# Patient Record
Sex: Male | Born: 1950 | Race: White | Hispanic: No | Marital: Single | Smoking: Former smoker
Health system: Southern US, Community
[De-identification: ages and names within clinical notes are randomized; demographics above are authoritative.]

## PROBLEM LIST (undated history)

## (undated) DIAGNOSIS — I639 Cerebral infarction, unspecified: Secondary | ICD-10-CM

## (undated) DIAGNOSIS — H269 Unspecified cataract: Secondary | ICD-10-CM

## (undated) DIAGNOSIS — I10 Essential (primary) hypertension: Secondary | ICD-10-CM

## (undated) DIAGNOSIS — D494 Neoplasm of unspecified behavior of bladder: Secondary | ICD-10-CM

## (undated) DIAGNOSIS — E785 Hyperlipidemia, unspecified: Secondary | ICD-10-CM

## (undated) HISTORY — DX: Neoplasm of unspecified behavior of bladder: D49.4

## (undated) HISTORY — DX: Essential (primary) hypertension: I10

## (undated) HISTORY — DX: Hyperlipidemia, unspecified: E78.5

## (undated) HISTORY — DX: Unspecified cataract: H26.9

## (undated) HISTORY — PX: MOUTH SURGERY: SHX715

## (undated) HISTORY — PX: CHOLECYSTECTOMY: SHX55

## (undated) HISTORY — DX: Cerebral infarction, unspecified: I63.9

## (undated) HISTORY — PX: EYE SURGERY: SHX253

---

## 2006-04-16 ENCOUNTER — Inpatient Hospital Stay (HOSPITAL_COMMUNITY): Admission: EM | Admit: 2006-04-16 | Discharge: 2006-04-28 | Payer: Self-pay | Admitting: Emergency Medicine

## 2006-04-20 ENCOUNTER — Ambulatory Visit: Payer: Self-pay | Admitting: Physical Medicine & Rehabilitation

## 2008-03-17 ENCOUNTER — Ambulatory Visit: Payer: Self-pay | Admitting: Gastroenterology

## 2008-03-20 ENCOUNTER — Telehealth: Payer: Self-pay | Admitting: Gastroenterology

## 2008-03-27 ENCOUNTER — Telehealth: Payer: Self-pay | Admitting: Gastroenterology

## 2008-03-28 ENCOUNTER — Encounter: Payer: Self-pay | Admitting: Gastroenterology

## 2008-03-28 ENCOUNTER — Ambulatory Visit: Payer: Self-pay | Admitting: Gastroenterology

## 2008-03-30 ENCOUNTER — Encounter: Payer: Self-pay | Admitting: Gastroenterology

## 2008-03-31 ENCOUNTER — Telehealth: Payer: Self-pay | Admitting: Gastroenterology

## 2008-04-07 DIAGNOSIS — I10 Essential (primary) hypertension: Secondary | ICD-10-CM

## 2008-04-07 DIAGNOSIS — E785 Hyperlipidemia, unspecified: Secondary | ICD-10-CM | POA: Insufficient documentation

## 2008-04-07 DIAGNOSIS — F70 Mild intellectual disabilities: Secondary | ICD-10-CM

## 2008-04-07 DIAGNOSIS — K5730 Diverticulosis of large intestine without perforation or abscess without bleeding: Secondary | ICD-10-CM | POA: Insufficient documentation

## 2008-05-17 ENCOUNTER — Ambulatory Visit: Payer: Self-pay | Admitting: Gastroenterology

## 2008-05-17 DIAGNOSIS — D126 Benign neoplasm of colon, unspecified: Secondary | ICD-10-CM | POA: Insufficient documentation

## 2008-05-17 DIAGNOSIS — K625 Hemorrhage of anus and rectum: Secondary | ICD-10-CM

## 2008-05-17 DIAGNOSIS — R1032 Left lower quadrant pain: Secondary | ICD-10-CM | POA: Insufficient documentation

## 2008-05-17 LAB — CONVERTED CEMR LAB: BUN: 13 mg/dL (ref 6–23)

## 2008-05-24 ENCOUNTER — Ambulatory Visit: Payer: Self-pay | Admitting: Cardiology

## 2008-05-25 ENCOUNTER — Encounter: Payer: Self-pay | Admitting: Gastroenterology

## 2008-05-29 ENCOUNTER — Telehealth: Payer: Self-pay | Admitting: Gastroenterology

## 2008-05-29 ENCOUNTER — Encounter: Payer: Self-pay | Admitting: Gastroenterology

## 2008-07-25 ENCOUNTER — Encounter (INDEPENDENT_AMBULATORY_CARE_PROVIDER_SITE_OTHER): Payer: Self-pay | Admitting: *Deleted

## 2008-07-25 ENCOUNTER — Ambulatory Visit (HOSPITAL_BASED_OUTPATIENT_CLINIC_OR_DEPARTMENT_OTHER): Admission: RE | Admit: 2008-07-25 | Discharge: 2008-07-25 | Payer: Self-pay | Admitting: Urology

## 2008-07-25 ENCOUNTER — Encounter (INDEPENDENT_AMBULATORY_CARE_PROVIDER_SITE_OTHER): Payer: Self-pay | Admitting: Urology

## 2009-06-04 ENCOUNTER — Ambulatory Visit (HOSPITAL_COMMUNITY): Admission: RE | Admit: 2009-06-04 | Discharge: 2009-06-04 | Payer: Self-pay | Admitting: Internal Medicine

## 2009-10-10 ENCOUNTER — Emergency Department (HOSPITAL_COMMUNITY): Admission: EM | Admit: 2009-10-10 | Discharge: 2009-10-10 | Payer: Self-pay | Admitting: Emergency Medicine

## 2010-04-11 NOTE — Op Note (Signed)
Summary: Bladder Tumor  NAME:  [name] [name], [name] [name]                 ACCOUNT NO.:  [id number]      MEDICAL RECORD NO.:  [id number]          PATIENT TYPE:  AMB      LOCATION:  [facility]                         FACILITY:  [facility]      PHYSICIAN:  Courtney Paris, M.D.DATE OF BIRTH:  1950/12/07      DATE OF PROCEDURE:  07/25/2008   DATE OF DISCHARGE:                                  OPERATIVE REPORT      PREOPERATIVE DIAGNOSIS:  Bladder tumor.      POSTOPERATIVE DIAGNOSIS:  Bladder tumor.      PROCEDURE PERFORMED:  Transurethral resection of bladder tumor(medium).      SURGEON:  Courtney Paris, M.D.      RESIDENTRonaldo Miyamoto A. Peggye Pitt, M.D.      ANESTHESIA:  General endotracheal anesthesia.      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      SPECIMENS:  Bladder tumor to Pathology.      ESTIMATED BLOOD LOSS:  Minimal.      COMPLICATIONS:  None.      DRAINS:  20 French Foley catheter to gravity.      INDICATIONS FOR PROCEDURE:  [name] [name] is a 60 year old Caucasian male who   developed gross hematuria.  He was evaluated with actual imaging and was   found to have a bladder tumor.  This was confirmed on office cystoscopy.   He was recommended to undergo transurethral resection.  He presents   today for the above-mentioned procedure.      DESCRIPTION OF PROCEDURE IN DETAIL:  [name] was brought back to the   operating room, quickly identified via his wrist band.  A preoperative   time out was called to confirm the correct patient, procedure and site.   After successful induction of general endotracheal anesthesia, he was   positioned in the dorsal lithotomy position, and great efforts were made   to reduce any pressure on bony prominences.  IV antibiotics were   administered.  His perineum was prepped and draped in the usual sterile   fashion.  Surgeons were sterilely gowned and gloved.      The urethra was calibrated using [employer] sounds.  A 22-French   cystoscopic sheath was used to insert a 12-degree lens  transurethrally   into the bladder.  Pan cystoscopy was performed.  The anterior and   posterior urethra were normal in appearance.  The bladder was normal in   shape, size and capacity.  There is no evidence of any diverticular   stone or foreign bodies, bilateral ureteral orifices were noted in their   usual anatomic position.  There seemed to be some clear yellow urine.   Cystoscopy with a 12 and 70-degree lens was performed and did reveal   evidence of a single papillary low-grade appearing bladder tumor   approximately 2 to 3 cm located in the left posterolateral wall.  The   cystoscope was then removed.  A 28-French resectoscope sheath was used to insert a resectoscope   transurethrally into the bladder.  The tumor was systematically   resected.  The wound edges were cauterized using electrocautery.   Excellent hemostasis was achieved.  The bladder chips were then   irrigated using a Toomey syringe and sent to permanent pathology.  The   patient's bladder was emptied and filled several times, and there was no   evidence of any occult bleeding.  The resectoscope was removed.  A 20-   French Foley catheter was then inserted transurethrally into the   patient's bladder, and the balloon was inflated with 10 cc of sterile   water.  It was connected to a drainage bag.  His urine was clear at the   end of the case.      Please note, Dr. Vic Blackbird was present and available and   participated in all aspects of this patient's care.      DISPOSITION:  The patient tolerated the procedure well, was extubated,   and transported to the PACU in stable condition.            ______________________________   [name], M.D.   Electronically Signed         KR/MEDQ  D:  07/25/2008  T:  07/25/2008  Job:  [id number]

## 2010-04-21 LAB — POCT I-STAT 4, (NA,K, GLUC, HGB,HCT)
HCT: 48 % (ref 39.0–52.0)
Hemoglobin: 16.3 g/dL (ref 13.0–17.0)
Sodium: 143 mEq/L (ref 135–145)

## 2010-05-28 NOTE — Op Note (Signed)
NAME:  [name] [name], [name] [name]                 ACCOUNT NO.:  [id number]   MEDICAL RECORD NO.:  [id number]          PATIENT TYPE:  AMB   LOCATION:  [facility]                         FACILITY:  [facility]   PHYSICIAN:  Courtney Paris, M.D.DATE OF BIRTH:  Jan 01, 1951   DATE OF PROCEDURE:  07/25/2008  DATE OF DISCHARGE:                               OPERATIVE REPORT   PREOPERATIVE DIAGNOSIS:  Bladder tumor.   POSTOPERATIVE DIAGNOSIS:  Bladder tumor.   PROCEDURE PERFORMED:  Transurethral resection of bladder tumor(medium).   SURGEON:  Courtney Paris, M.D.   RESIDENTRonaldo Miyamoto A. Peggye Pitt, M.D.   ANESTHESIA:  General endotracheal anesthesia.   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   SPECIMENS:  Bladder tumor to Pathology.   ESTIMATED BLOOD LOSS:  Minimal.   COMPLICATIONS:  None.   DRAINS:  20 French Foley catheter to gravity.   INDICATIONS FOR PROCEDURE:  [name] [name] is a 60 year old Caucasian male who  developed gross hematuria.  He was evaluated with actual imaging and was  found to have a bladder tumor.  This was confirmed on office cystoscopy.  He was recommended to undergo transurethral resection.  He presents  today for the above-mentioned procedure.   DESCRIPTION OF PROCEDURE IN DETAIL:  [name] [name] was brought back to the  operating room, quickly identified via his wrist band.  A preoperative  time out was called to confirm the correct patient, procedure and site.  After successful induction of general endotracheal anesthesia, he was  positioned in the dorsal lithotomy position, and great efforts were made  to reduce any pressure on bony prominences.  IV antibiotics were  administered.  His perineum was prepped and draped in the usual sterile  fashion.  Surgeons were sterilely gowned and gloved.   The urethra was calibrated using [employer] sounds.  A 22-French  cystoscopic sheath was used to insert a 12-degree lens   transurethrally  into the bladder.  Pan cystoscopy was performed.  The anterior and  posterior urethra were normal in appearance.  The bladder was normal in  shape, size and capacity.  There is no evidence of any diverticular  stone or foreign bodies, bilateral ureteral orifices were noted in their  usual anatomic position.  There seemed to be some clear yellow urine.  Cystoscopy with a 12 and 70-degree lens was performed and did reveal  evidence of a single papillary low-grade appearing bladder tumor  approximately 2 to 3 cm located in the left posterolateral wall.  The  cystoscope was then removed.   A 28-French resectoscope sheath was used to insert a resectoscope  transurethrally into the bladder.  The tumor was systematically  resected.  The wound edges were cauterized using electrocautery.  Excellent hemostasis was achieved.  The bladder chips were then  irrigated using a Toomey syringe and sent to permanent pathology.  The  patient's bladder was emptied and filled several times, and there was no  evidence of any occult bleeding.  The resectoscope was removed.  A 20-  [place] Foley  catheter was then inserted transurethrally into the  patient's bladder, and the balloon was inflated with 10 cc of sterile  water.  It was connected to a drainage bag.  His urine was clear at the  end of the case.   Please note, Dr. Vic Blackbird was present and available and  participated in all aspects of this patient's care.   DISPOSITION:  The patient tolerated the procedure well, was extubated,  and transported to the PACU in stable condition.     ______________________________  [name], M.D.  Electronically Signed    KR/MEDQ  D:  07/25/2008  T:  07/25/2008  Job:  [id number]

## 2010-05-31 NOTE — H&P (Signed)
NAME:  [name] [name], [name] [name]                 ACCOUNT NO.:  [id number]   MEDICAL RECORD NO.:  [id number]          PATIENT TYPE:  EMS   LOCATION:  [facility]                         FACILITY:  [facility]   PHYSICIAN:  Pramod P. Pearlean Brownie, MD    DATE OF BIRTH:  [birth year]   DATE OF ADMISSION:  04/16/2006  DATE OF DISCHARGE:                              HISTORY & PHYSICAL   REASON FOR REFERRAL:  Code stroke.   HISTORY OF PRESENT ILLNESS:  [name] [name] is a 60 year old Caucasian male  who was brought to the emergency room by EMS when they received a call  from the group home where he lives, with the patient having left-sided  weakness and numbness.  The patient states when he woke up in the  morning he was fine but at about 5 o'clock he tried to get out of the  bed.  He noticed that his left side was clumsy and weak.  He also  noticed that his speech was not right.  He also complained of her right  temporal headache.  His symptoms have persisted since admission.  EMS  called a code stroke en route and the patient code stroke was called and  the patient arrived at 7:49 a.m.  Admission CT scan was done and results  received at 8:37 which showed a large right frontoparietal cortical  intercerebral hemorrhage without intraventricular extension, mass  effect, or midline shift.  The patient had no documented seizure.  He  denies any previous history of stroke, TIA, seizures, or significant  neurological problems.   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  Denies history of  hypertension, family history of aneurysms or brain hemorrhage.   HOME MEDICATIONS:  None.   PAST SURGICAL HISTORY:  Eye surgery for a movement abnormality in  childhood.   MEDICATION ALLERGIES:  None known.   FAMILY HISTORY:  Not known at this time. The patient lives in assisted  living in a group home.  He does not smoke or drink.   REVIEW OF SYSTEMS:  Not significant  for any recent fever, cough, chest  pain, shortness of breath or diarrhea.   PHYSICAL EXAM:  GENERAL:  Reveals a middle-aged Caucasian male who is  not in distress.  He appears quite anxious.  VITAL SIGNS:  He is afebrile with temperature 97.1, pulse rate is 68,  regular, respiratory rate 18, distal pulses well. The head is  nontraumatic.  He has hypertelorism.  He has mild ptosis of the right  eye but according to the patient he had eye surgery on that side.  HEART:  Regular heart sounds.  LUNGS:  Clear to auscultation.  His blood pressure 168/95, pulse 68,  respiratory rate 18 per minute, saturations 97% on room air.  NEUROLOGY:  The patient is awake, alert.  He follows commands well.  He  has mild dysarthria.  There is no aphasia. Movements are full range  without nystagmus or diplopia.  He has mild ptosis of the right eye  which is  old.  There is mild left facial symmetry.  He has significant  left hemiparesis with grade 2 to 3/5 strength in the left upper  extremity and also 2/5 the left lower extremity.  A decreased sensation  on the left. Coordination is impaired on the left.  The NIH stroke scale  he scored 8.  Plantars downgoing.  Gait was not tested.   DATA REVIEWED:  Noncontrast CAT scan of the head done today reveals a  6.5 x 3.7 x 4.5 cm right posterior frontal cortical hemorrhage with  minimal mass effect, no intraventricular extension.  No midline shift is  seen.  There is no subarachnoid hemorrhage noted.  CT scan also shows a  large emesis of the corpus callosum with abnormal shaped scar.   LABS:  Pending at this time.   IMPRESSION:  56-hour gentleman with right frontal cortical intracerebral  hemorrhage, exact etiology is unclear at this time.  Possibilities  include an AV malformation given his congenital antibodies and mild  mental retardation.  There is no family history of aneurysms or  hypertension in this patient.   PLAN:  Patient is critically ill, will be  admitted to the neurological  intensive care unit for blood pressure control and monitoring.  We will  keep systolic blood pressure below 161, use IV labetalol p.r.n. dosages  and IV Cardene drip necessary.  Keep the patient n.p.o. until evaluated  for swallowing.  GI prophylaxis with Protonix and DVT prophylaxis with  TED hoses.  We will try to contact the patient's family and keep them  informed.  Neurosurgery has already been consulted, Dr. Donalee Citrin has  seen the patient and does not feel the patient needs surgical  intervention at the present time.  The patient is critically ill is  provided 1 hour of critical care time at the patient's bedside  directing, evaluating his care, and managing him.           ______________________________  Sunny Schlein. Pearlean Brownie, MD     PPS/MEDQ  D:  04/16/2006  T:  04/16/2006  Job:  [id number]

## 2010-05-31 NOTE — Consult Note (Signed)
[name] [name], [name] [name]                 ACCOUNT NO.:  [id number]   MEDICAL RECORD NO.:  [id number]          PATIENT TYPE:  INP   LOCATION:  1833                         FACILITY:  [facility]   PHYSICIAN:  Donalee Citrin, M.D.        DATE OF BIRTH:  Feb 12, 1950   DATE OF CONSULTATION:  04/16/2006  DATE OF DISCHARGE:                                 CONSULTATION   REASON FOR CONSULTATION:  Right parietal hemorrhage.   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  The patient apparently awoke this morning, felt fine,  dressed himself, got in his car, drove out to [place] for  breakfast, and noticed some clumsiness of his left hand.  He  subsequently called EMS and was brought here.  On evaluation, the  patient was emotional.  Vital signs were stable, and the patient was  noted to have some clumsiness and weakness on the left side and was sent  to CT scan, and then Neurosurgery was subsequently consulted.  The  patient currently on evaluation is awake and alert.  He is oriented x2.  Pupils are slight anasicoric with the left side being approximately 1 mm  larger than the right but both reactive.  Extraocular movements are  noted to be intact.  Cranial nerves appear to be otherwise intact except  for a slight left central seventh.  Extremity strength has about 4 out  of 5 weakness on the left side both in the upper and lower extremity;  however, lower extremity is probably more like 4 plus out of 5.  Reflexes appear to be 2+ on the right side.  However, on the left side  the patient was noncompliant and would not relax.  Had some increased  tone.  This was felt to be the patient's inability to comply with the  examination and not necessarily representing overt spasticity.  CT scan  showed a large right posterior  frontoparietal fusiform hemorrhage that  could represent hemorrhage CVA versus low bar/amyloid hemorrhage.  The  patient currently does not appear to be on any medications or blood  thinners, and it is unknown whether the patient is taking any  recreational medications and would recommend a toxicology screen,  recommend stroke workup.  Neurology is already on board and consulting.  This is not a nonsurgical hemorrhage at this time.  Would follow with  serial CTs in the morning, check his coags and toxicology screen and  follow with every one hour serial neurological checks.           ______________________________  Donalee Citrin, M.D.     GC/MEDQ  D:  04/16/2006  T:  04/16/2006  Job:  [id number]

## 2010-05-31 NOTE — Discharge Summary (Signed)
NAME:  [name] [name], [name]                   ACCOUNT NO.:  and no   MEDICAL RECORD NO.:  [id number]          PATIENT TYPE:  INP   LOCATION:  3009                         FACILITY:  [facility]   PHYSICIAN:  [name] P. Pearlean Brownie, MD    DATE OF BIRTH:  [birth year]   DATE OF ADMISSION:  04/16/2006  DATE OF DISCHARGE:                               DISCHARGE SUMMARY   DIAGNOSES AT THE TIME OF DISCHARGE:  1. 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  2. Hypertension.  3. Dyslipidemia.  4. Mild mental retardation.  5. Living in a boarding house prior to admission.   MEDICINES AT TIME OF DISCHARGE:  1. Hydrochlorothiazide 25 mg a day.  2. Niacin 500 mg a day.  3. Zocor 40 mg a day.  4. Colace 100 mg b.i.d.   STUDIES PERFORMED:  1. CT of the brain on admission showed acute right hemispheric intra-      axial hemorrhage involving the high right frontal parietal region.      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  2. MRI of the brain showed a 2.1 x 6.9 intraparenchymal hematoma and a      high right parietal lobe with surrounding edema, underlying pattern      of agenesis of corpus callosum, mild small-vessel changes in the      deep white matter, inflammatory disease of the paranasal sinuses on      the right.  3. MRA of the head shows normal endocranial MRA.  4. 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  Non-visualization of the inferior sagittal      sinus with diminutive internal cerebral veins.  Straight sinus      appears to be patent.  Venous drainage pattern is otherwise, as      expected.  5. Carotid Doppler not performed.  CT echocardiogram not performed.  6. EKG shows normal sinus rhythm.   LABORATORY  STUDIES:  Shows CBC normal, differential normal, coagulation  studies normal, chemistries with glucose 102, otherwise normal.  Glomerular filtration rate greater than 60.  Chemistry normal.  Cholesterol 168, triglycerides 95, HDL 25, LDL 124, urine drug screen is  negative.  Alcohol level is less than 5.  Urinalysis shows 15 ketones,  otherwise normal.   HISTORY OF PRESENT ILLNESS:  Mr. Shank is a 60 year-old Caucasian male  who was brought emergency room by EMS when they received a call from the  boarding house where he lives.  The patient was having left-sided  weakness and numbness.  The patient states that when he woke up in the  morning he was fine.  But at about five o'clock in the morning when he  tried to get out of bed, he noticed that his left side was clumsy and  weak.  He also noticed that his speech was not right.  He complained of  right temporal headache.  Symptoms have persisted since admission.  EMS  called a code stroke en route.  The patient arrived at 7:49 a.m.  A CT  of the head was done with results received a 8:37 a.m., which showed a  large right, frontoparietal, cortical, cerebral hemorrhage without  intraventricular extension, mass effect or midline shift.  The patient  had no documented seizure.  He denies any previous stroke, TIAs,  seizures, or significant neurological problems.  He was admitted to the  ICU for further evaluation.   HOSPITAL COURSE:  Dr. Donalee Citrin, your surgeon, was consulted.  He was  not a neurosurgical candidate on admission, so they did follow him for  the first few days; until he became stable.  His blood pressure was  controlled in the emergency room.  A cerebral angiogram was performed to  rule out cortical venous thrombosis, versus subdural AVM and both were  negative.  PT and OT then began working with him after his transfer to  the floor.  They felt he would need some rehab; as he is from a boarding  house, and a sister who is also  likely slightly mentally retarded was  unable to care for him at the time of discharge.  He cannot go to  inpatient rehab as he had no discharge plan. Plans are to send him to  skilled nursing facility with hopeful eventual return to the boarding  house.  Of note the patient was improving significantly during his  hospital course.  The patient was identified with vascular risk factors  of hypertension and dyslipidemia for which he was started on blood  pressure medicine, statin and niacin.  The patient has maintained  stability the last week of hospitalization, awaiting a nursing home bed.  Anticipated date of discharge is April 28, 2006, at the time of this  dictation.   CONDITION AT DISCHARGE:  Patient alert and oriented x3.  No dysarthria,  extraocular movements intact.  Muscle strength is 5/5 on the right and  4/5 on the left.  He has mild left upper extremity drift.  His heart  rate is regular his lungs are clear. His abdomen is soft and it is  nondistended.   DISCHARGE PLAN:  1. Discharged to skilled nursing facility for rehab.  Anticipate      eventual return to boarding house.  2. New statin and niacin for elevated cholesterol levels; will recheck      in four to six weeks.  3. Followup blood pressure. Primary care physician to followup other      vascular risk factors.  4. Follow up with Dr. Pearlean [name] in his office in 2 to 3 months.      [name] [name] [name], N.P.    ______________________________  Sunny Schlein. Pearlean Brownie, MD    SB/MEDQ  D:  04/27/2006  T:  04/27/2006  Job:  [id number]   cc:   [name] P. Pearlean Brownie, MD

## 2012-04-12 DIAGNOSIS — I6990 Unspecified sequelae of unspecified cerebrovascular disease: Secondary | ICD-10-CM

## 2012-04-12 DIAGNOSIS — I119 Hypertensive heart disease without heart failure: Secondary | ICD-10-CM

## 2012-04-12 DIAGNOSIS — E785 Hyperlipidemia, unspecified: Secondary | ICD-10-CM

## 2012-04-12 DIAGNOSIS — Q619 Cystic kidney disease, unspecified: Secondary | ICD-10-CM

## 2012-04-12 DIAGNOSIS — K5900 Constipation, unspecified: Secondary | ICD-10-CM

## 2012-04-12 DIAGNOSIS — F70 Mild intellectual disabilities: Secondary | ICD-10-CM

## 2012-05-10 DIAGNOSIS — I6990 Unspecified sequelae of unspecified cerebrovascular disease: Secondary | ICD-10-CM

## 2012-05-10 DIAGNOSIS — F70 Mild intellectual disabilities: Secondary | ICD-10-CM

## 2012-05-10 DIAGNOSIS — I119 Hypertensive heart disease without heart failure: Secondary | ICD-10-CM

## 2012-05-10 DIAGNOSIS — K5900 Constipation, unspecified: Secondary | ICD-10-CM

## 2012-05-10 DIAGNOSIS — Q619 Cystic kidney disease, unspecified: Secondary | ICD-10-CM

## 2012-05-10 DIAGNOSIS — E785 Hyperlipidemia, unspecified: Secondary | ICD-10-CM

## 2012-05-26 DIAGNOSIS — K5900 Constipation, unspecified: Secondary | ICD-10-CM

## 2012-05-26 DIAGNOSIS — I119 Hypertensive heart disease without heart failure: Secondary | ICD-10-CM

## 2012-05-26 DIAGNOSIS — Q619 Cystic kidney disease, unspecified: Secondary | ICD-10-CM

## 2012-05-26 DIAGNOSIS — E785 Hyperlipidemia, unspecified: Secondary | ICD-10-CM

## 2012-05-26 DIAGNOSIS — F70 Mild intellectual disabilities: Secondary | ICD-10-CM

## 2012-05-26 DIAGNOSIS — I6990 Unspecified sequelae of unspecified cerebrovascular disease: Secondary | ICD-10-CM

## 2012-06-30 DIAGNOSIS — K5900 Constipation, unspecified: Secondary | ICD-10-CM

## 2012-06-30 DIAGNOSIS — E785 Hyperlipidemia, unspecified: Secondary | ICD-10-CM

## 2012-06-30 DIAGNOSIS — I119 Hypertensive heart disease without heart failure: Secondary | ICD-10-CM

## 2012-06-30 DIAGNOSIS — Q619 Cystic kidney disease, unspecified: Secondary | ICD-10-CM

## 2012-06-30 DIAGNOSIS — I6990 Unspecified sequelae of unspecified cerebrovascular disease: Secondary | ICD-10-CM

## 2012-06-30 DIAGNOSIS — T148XXA Other injury of unspecified body region, initial encounter: Secondary | ICD-10-CM

## 2012-06-30 DIAGNOSIS — Z79899 Other long term (current) drug therapy: Secondary | ICD-10-CM

## 2012-08-05 ENCOUNTER — Non-Acute Institutional Stay: Payer: PRIVATE HEALTH INSURANCE | Admitting: Internal Medicine

## 2012-08-05 DIAGNOSIS — R7309 Other abnormal glucose: Secondary | ICD-10-CM

## 2012-08-05 DIAGNOSIS — K5900 Constipation, unspecified: Secondary | ICD-10-CM | POA: Insufficient documentation

## 2012-08-05 DIAGNOSIS — I10 Essential (primary) hypertension: Secondary | ICD-10-CM

## 2012-08-05 DIAGNOSIS — R739 Hyperglycemia, unspecified: Secondary | ICD-10-CM | POA: Insufficient documentation

## 2012-08-05 DIAGNOSIS — E785 Hyperlipidemia, unspecified: Secondary | ICD-10-CM

## 2012-08-05 NOTE — Progress Notes (Signed)
PROGRESS NOTE  DATE: 08/05/2012  FACILITY: Nursing Home Location: [facility]  LEVEL OF CARE: ALF (13)  Routine Visit  CHIEF COMPLAINT:  Manage hypertension, hyperlipidemia and constipation  HISTORY OF PRESENT ILLNESS:  REASSESSMENT OF ONGOING PROBLEM(S):  HTN: Pt 's HTN remains stable.  Denies CP, sob, DOE, pedal edema, headaches, dizziness or visual disturbances.  No complications from the medications currently being used.  Last BP : 122/72  HYPERLIPIDEMIA: No complications from the medications presently being used. Last fasting lipid panel showed : HDL 33 otherwise fostering lipid panel normal in 7/14  CONSTIPATION: The constipation remains stable. No complications from the medications presently being used. Patient denies ongoing constipation, abdominal pain, nausea or vomiting.  PAST MEDICAL HISTORY : Reviewed.  No changes.  CURRENT MEDICATIONS: Reviewed per [facility]  REVIEW OF SYSTEMS:  GENERAL: no change in appetite, no fatigue, no weight changes, no fever, chills or weakness RESPIRATORY: no cough, SOB, DOE, wheezing, hemoptysis CARDIAC: no chest pain, edema or palpitations GI: no abdominal pain, diarrhea, constipation, heart burn, nausea or vomiting  PHYSICAL EXAMINATION  VS:  T 98.6       P 64     RR 20      BP     POX %     WT (Lb) 209  GENERAL: no acute distress, moderately obese body habitus EYES: conjunctivae normal, sclerae normal, normal eye lids NECK: supple, trachea midline, no neck masses, no thyroid tenderness, no thyromegaly LYMPHATICS: no LAN in the neck, no supraclavicular LAN RESPIRATORY: breathing is even & unlabored, BS CTAB CARDIAC: RRR, no murmur,no extra heart sounds, no edema GI: abdomen soft, normal BS, no masses, no tenderness, no hepatomegaly, no splenomegaly PSYCHIATRIC: the patient is alert & oriented to person, affect & behavior appropriate  LABS/RADIOLOGY:  7/14 glucose 140 otherwise CMP normal 6/14 CBC  normal  ASSESSMENT/PLAN:  Hypertension-well-controlled. Lisinopril was started. Hyperlipidemia on-well-controlled. Constipation-well-controlled. Hyperglycemia-new problem. Check fasting glucose level and hemoglobin A1c. 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  CPT CODE: 84696

## 2012-09-06 ENCOUNTER — Non-Acute Institutional Stay: Payer: PRIVATE HEALTH INSURANCE | Admitting: Internal Medicine

## 2012-09-06 DIAGNOSIS — E785 Hyperlipidemia, unspecified: Secondary | ICD-10-CM

## 2012-09-06 DIAGNOSIS — R7309 Other abnormal glucose: Secondary | ICD-10-CM

## 2012-09-06 DIAGNOSIS — K5900 Constipation, unspecified: Secondary | ICD-10-CM

## 2012-09-06 DIAGNOSIS — R739 Hyperglycemia, unspecified: Secondary | ICD-10-CM

## 2012-09-06 DIAGNOSIS — I10 Essential (primary) hypertension: Secondary | ICD-10-CM

## 2012-09-08 NOTE — Progress Notes (Signed)
PROGRESS NOTE  DATE: 09-06-12  FACILITY: Nursing Home Location: [facility]  LEVEL OF CARE: ALF (13)  Routine Visit  CHIEF COMPLAINT:  Manage hypertension, hyperlipidemia and constipation  HISTORY OF PRESENT ILLNESS:  REASSESSMENT OF ONGOING PROBLEM(S):  HTN: Pt 's HTN remains stable.  Denies CP, sob, DOE, pedal edema, headaches, dizziness or visual disturbances.  No complications from the medications currently being used.  Last BP : 122/72, 128/64  HYPERLIPIDEMIA: No complications from the medications presently being used. Last fasting lipid panel showed : HDL 33 otherwise fostering lipid panel normal in 7/14  CONSTIPATION: The constipation remains stable. No complications from the medications presently being used. Patient denies ongoing constipation, abdominal pain, nausea or vomiting.  PAST MEDICAL HISTORY : Reviewed.  No changes.  CURRENT MEDICATIONS: Reviewed per [facility]  REVIEW OF SYSTEMS:  GENERAL: no change in appetite, no fatigue, no weight changes, no fever, chills or weakness RESPIRATORY: no cough, SOB, DOE, wheezing, hemoptysis CARDIAC: no chest pain, edema or palpitations GI: no abdominal pain, diarrhea, constipation, heart burn, nausea or vomiting  PHYSICAL EXAMINATION  VS:  T 98.1       P 60     RR 18      BP 128/64     POX %     WT (Lb) 209  GENERAL: no acute distress, moderately obese body habitus EYES: conjunctivae normal, sclerae normal, normal eye lids NECK: supple, trachea midline, no neck masses, no thyroid tenderness, no thyromegaly LYMPHATICS: no LAN in the neck, no supraclavicular LAN RESPIRATORY: breathing is even & unlabored, BS CTAB CARDIAC: RRR, no murmur,no extra heart sounds, no edema GI: abdomen soft, normal BS, no masses, no tenderness, no hepatomegaly, no splenomegaly PSYCHIATRIC: the patient is alert & oriented to person, affect & behavior appropriate  LABS/RADIOLOGY:  7/14 glucose 140 otherwise CMP normal 6/14 CBC  normal  ASSESSMENT/PLAN:  Hypertension-well-controlled.  Hyperlipidemia on-well-controlled. Constipation-well-controlled. Hyperglycemia- fasting glucose level and hemoglobin A1c level pending. 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  CPT CODE: 11914

## 2012-09-30 ENCOUNTER — Non-Acute Institutional Stay: Payer: PRIVATE HEALTH INSURANCE | Admitting: Internal Medicine

## 2012-09-30 DIAGNOSIS — E785 Hyperlipidemia, unspecified: Secondary | ICD-10-CM

## 2012-09-30 DIAGNOSIS — I10 Essential (primary) hypertension: Secondary | ICD-10-CM

## 2012-09-30 DIAGNOSIS — R7309 Other abnormal glucose: Secondary | ICD-10-CM

## 2012-09-30 DIAGNOSIS — K5900 Constipation, unspecified: Secondary | ICD-10-CM

## 2012-09-30 DIAGNOSIS — R739 Hyperglycemia, unspecified: Secondary | ICD-10-CM

## 2012-10-01 NOTE — Progress Notes (Signed)
PROGRESS NOTE  DATE: 09-30-12  FACILITY: Nursing Home Location: [facility]  LEVEL OF CARE: ALF (13)  Routine Visit  CHIEF COMPLAINT:  Manage hypertension, hyperlipidemia and constipation  HISTORY OF PRESENT ILLNESS:  REASSESSMENT OF ONGOING PROBLEM(S):  HTN: Pt 's HTN remains stable.  Denies CP, sob, DOE, pedal edema, headaches, dizziness or visual disturbances.  No complications from the medications currently being used.  Last BP : 122/72, 128/64, 152/72  HYPERLIPIDEMIA: No complications from the medications presently being used. Last fasting lipid panel showed : HDL 33 otherwise fostering lipid panel normal in 7/14  CONSTIPATION: The constipation remains stable. No complications from the medications presently being used. Patient denies ongoing constipation, abdominal pain, nausea or vomiting.  PAST MEDICAL HISTORY : Reviewed.  No changes.  CURRENT MEDICATIONS: Reviewed per [facility]  REVIEW OF SYSTEMS:  GENERAL: no change in appetite, no fatigue, no weight changes, no fever, chills or weakness RESPIRATORY: no cough, SOB, DOE, wheezing, hemoptysis CARDIAC: no chest pain, edema or palpitations GI: no abdominal pain, diarrhea, constipation, heart burn, nausea or vomiting  PHYSICAL EXAMINATION  VS:  T 96       P 66     RR 18      BP 152/72    POX %     WT (Lb) 212  GENERAL: no acute distress, moderately obese body habitus EYES: conjunctivae normal, sclerae normal, normal eye lids NECK: supple, trachea midline, no neck masses, no thyroid tenderness, no thyromegaly LYMPHATICS: no LAN in the neck, no supraclavicular LAN RESPIRATORY: breathing is even & unlabored, BS CTAB CARDIAC: RRR, no murmur,no extra heart sounds, no edema GI: abdomen soft, normal BS, no masses, no tenderness, no hepatomegaly, no splenomegaly PSYCHIATRIC: the patient is alert & oriented to person, affect & behavior appropriate  LABS/RADIOLOGY:  7/14 glucose 140 otherwise CMP normal 6/14  CBC normal  ASSESSMENT/PLAN:  Hypertension-blood pressure elevated. Will review a log. Hyperlipidemia on-well-controlled. Constipation-well-controlled. Hyperglycemia- fasting glucose level and hemoglobin A1c level pending. 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  CPT CODE: 16109

## 2012-11-09 ENCOUNTER — Non-Acute Institutional Stay: Payer: PRIVATE HEALTH INSURANCE | Admitting: Internal Medicine

## 2012-11-09 DIAGNOSIS — K5900 Constipation, unspecified: Secondary | ICD-10-CM

## 2012-11-09 DIAGNOSIS — E785 Hyperlipidemia, unspecified: Secondary | ICD-10-CM

## 2012-11-09 DIAGNOSIS — D494 Neoplasm of unspecified behavior of bladder: Secondary | ICD-10-CM

## 2012-11-09 DIAGNOSIS — I10 Essential (primary) hypertension: Secondary | ICD-10-CM

## 2012-11-12 DIAGNOSIS — D494 Neoplasm of unspecified behavior of bladder: Secondary | ICD-10-CM | POA: Insufficient documentation

## 2012-11-12 NOTE — Progress Notes (Signed)
PROGRESS NOTE  DATE: 11-09-12  FACILITY:    LEVEL OF CARE: ALF (13)  Routine Visit  CHIEF COMPLAINT:  Manage hypertension, hyperlipidemia and constipation  HISTORY OF PRESENT ILLNESS:  REASSESSMENT OF ONGOING PROBLEM(S):  HTN: Pt 's HTN remains stable.  Denies CP, sob, DOE, pedal edema, headaches, dizziness or visual disturbances.  No complications from the medications currently being used.  Last BP : 122/72, 128/64, 152/72, 118/66  HYPERLIPIDEMIA: No complications from the medications presently being used. Last fasting lipid panel showed : HDL 33 otherwise fostering lipid panel normal in 7/14  CONSTIPATION: The constipation remains stable. No complications from the medications presently being used. Patient denies ongoing constipation, abdominal pain, nausea or vomiting.  PAST MEDICAL HISTORY : Reviewed.  No changes.  CURRENT MEDICATIONS: Reviewed per [facility]  REVIEW OF SYSTEMS:  GENERAL: no change in appetite, no fatigue, no weight changes, no fever, chills or weakness RESPIRATORY: no cough, SOB, DOE, wheezing, hemoptysis CARDIAC: no chest pain, edema or palpitations GI: no abdominal pain, diarrhea, constipation, heart burn, nausea or vomiting  PHYSICAL EXAMINATION  VS:  T 98.1       P 68     RR 20      BP 118/66    POX %     WT (Lb) 210  GENERAL: no acute distress, moderately obese body habitus EYES: conjunctivae normal, sclerae normal, normal eye lids NECK: supple, trachea midline, no neck masses, no thyroid tenderness, no thyromegaly LYMPHATICS: no LAN in the neck, no supraclavicular LAN RESPIRATORY: breathing is even & unlabored, BS CTAB CARDIAC: RRR, no murmur,no extra heart sounds, no edema GI: abdomen soft, normal BS, no masses, no tenderness, no hepatomegaly, no splenomegaly PSYCHIATRIC: the patient is alert & oriented to person, affect & behavior appropriate  LABS/RADIOLOGY:  9-14 hemoglobin A1c 5.7 8-14 CBC normal, TSH 3.63  7/14 glucose 140 otherwise  CMP normal 6/14 CBC normal  ASSESSMENT/PLAN:  Hypertension-well controlled Hyperlipidemia on-well-controlled. Constipation-well-controlled. 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  CPT CODE: 16109

## 2012-12-02 ENCOUNTER — Encounter: Payer: Self-pay | Admitting: Internal Medicine

## 2012-12-02 ENCOUNTER — Non-Acute Institutional Stay: Payer: PRIVATE HEALTH INSURANCE | Admitting: Internal Medicine

## 2012-12-02 DIAGNOSIS — E785 Hyperlipidemia, unspecified: Secondary | ICD-10-CM

## 2012-12-02 DIAGNOSIS — I10 Essential (primary) hypertension: Secondary | ICD-10-CM

## 2012-12-02 DIAGNOSIS — D494 Neoplasm of unspecified behavior of bladder: Secondary | ICD-10-CM

## 2012-12-02 DIAGNOSIS — K5900 Constipation, unspecified: Secondary | ICD-10-CM

## 2012-12-02 NOTE — Progress Notes (Signed)
PROGRESS NOTE  DATE: 12-02-12  FACILITY: Nursing Home Location: [facility]  LEVEL OF CARE: ALF (13)  Routine Visit  CHIEF COMPLAINT:  Manage hypertension, hyperlipidemia and constipation  HISTORY OF PRESENT ILLNESS:  REASSESSMENT OF ONGOING PROBLEM(S):  HTN: Pt 's HTN remains stable.  Denies CP, sob, DOE, pedal edema, headaches, dizziness or visual disturbances.  No complications from the medications currently being used.  Last BP : 122/72, 128/64, 152/72, 118/66, 126/78  HYPERLIPIDEMIA: No complications from the medications presently being used. Last fasting lipid panel showed : HDL 33 otherwise fostering lipid panel normal in 7/14  CONSTIPATION: The constipation remains stable. No complications from the medications presently being used. Patient denies ongoing constipation, abdominal pain, nausea or vomiting.  PAST MEDICAL HISTORY : Reviewed.  No changes.  CURRENT MEDICATIONS: Reviewed per [facility]  REVIEW OF SYSTEMS:  GENERAL: no change in appetite, no fatigue, no weight changes, no fever, chills or weakness RESPIRATORY: no cough, SOB, DOE, wheezing, hemoptysis CARDIAC: no chest pain, edema or palpitations GI: no abdominal pain, diarrhea, constipation, heart burn, nausea or vomiting  PHYSICAL EXAMINATION  VS:  T 97.5       P 68     RR 20      BP 126/78    POX %     WT (Lb) 210  GENERAL: no acute distress, moderately obese body habitus EYES: conjunctivae normal, sclerae normal, normal eye lids NECK: supple, trachea midline, no neck masses, no thyroid tenderness, no thyromegaly LYMPHATICS: no LAN in the neck, no supraclavicular LAN RESPIRATORY: breathing is even & unlabored, BS CTAB CARDIAC: RRR, no murmur,no extra heart sounds, no edema GI: abdomen soft, normal BS, no masses, no tenderness, no hepatomegaly, no splenomegaly PSYCHIATRIC: the patient is alert & oriented to person, affect & behavior appropriate  LABS/RADIOLOGY:  9-14 hemoglobin A1c  5.7 8-14 CBC normal, TSH 3.63  7/14 glucose 140 otherwise CMP normal 6/14 CBC normal  ASSESSMENT/PLAN:  Hypertension-well controlled Hyperlipidemia on-well-controlled. Constipation-well-controlled. 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  CPT CODE: 45409

## 2012-12-23 ENCOUNTER — Non-Acute Institutional Stay: Payer: PRIVATE HEALTH INSURANCE | Admitting: Internal Medicine

## 2012-12-23 DIAGNOSIS — E785 Hyperlipidemia, unspecified: Secondary | ICD-10-CM

## 2012-12-23 DIAGNOSIS — I10 Essential (primary) hypertension: Secondary | ICD-10-CM

## 2012-12-23 DIAGNOSIS — D494 Neoplasm of unspecified behavior of bladder: Secondary | ICD-10-CM

## 2012-12-23 DIAGNOSIS — K5900 Constipation, unspecified: Secondary | ICD-10-CM

## 2012-12-25 ENCOUNTER — Encounter: Payer: Self-pay | Admitting: Internal Medicine

## 2012-12-25 NOTE — Progress Notes (Signed)
PROGRESS NOTE  DATE: 12-23-12  FACILITY: Nursing Home [facility]: [facility]  LEVEL OF CARE: ALF (13)  Routine Visit  CHIEF COMPLAINT:  Manage hypertension, hyperlipidemia and constipation  HISTORY OF PRESENT ILLNESS:  REASSESSMENT OF ONGOING PROBLEM(S):  HTN: Pt 's HTN remains stable.  Denies CP, sob, DOE, pedal edema, headaches, dizziness or visual disturbances.  No complications from the medications currently being used.  Last BP : 122/72, 128/64, 152/72, 118/66, 126/78, 122/64  HYPERLIPIDEMIA: No complications from the medications presently being used. Last fasting lipid panel showed : HDL 33 otherwise fostering lipid panel normal in 7/14  CONSTIPATION: The constipation remains stable. No complications from the medications presently being used. Patient denies ongoing constipation, abdominal pain, nausea or vomiting.  PAST MEDICAL HISTORY : Reviewed.  No changes.  CURRENT MEDICATIONS: Reviewed per [facility]  REVIEW OF SYSTEMS:  GENERAL: no change in appetite, no fatigue, no weight changes, no fever, chills or weakness RESPIRATORY: no cough, SOB, DOE, wheezing, hemoptysis CARDIAC: no chest pain, edema or palpitations GI: no abdominal pain, diarrhea, constipation, heart burn, nausea or vomiting  PHYSICAL EXAMINATION  VS:  T 97.6       P 72     RR 18      BP 122/64    POX %     WT (Lb) 209  GENERAL: no acute distress, moderately obese body habitus EYES: conjunctivae normal, sclerae normal, normal eye lids NECK: supple, trachea midline, no neck masses, no thyroid tenderness, no thyromegaly LYMPHATICS: no LAN in the neck, no supraclavicular LAN RESPIRATORY: breathing is even & unlabored, BS CTAB CARDIAC: RRR, no murmur,no extra heart sounds, no edema GI: abdomen soft, normal BS, no masses, no tenderness, no hepatomegaly, no splenomegaly PSYCHIATRIC: the patient is alert & oriented to person, affect & behavior appropriate  LABS/RADIOLOGY:  9-14 hemoglobin  A1c 5.7 8-14 CBC normal, TSH 3.63  7/14 glucose 140 otherwise CMP normal 6/14 CBC normal  ASSESSMENT/PLAN:  Hypertension-well controlled Hyperlipidemia on-well-controlled. Constipation-well-controlled. 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  CPT CODE: 11914

## 2013-01-27 ENCOUNTER — Non-Acute Institutional Stay: Payer: PRIVATE HEALTH INSURANCE | Admitting: Internal Medicine

## 2013-01-27 DIAGNOSIS — I10 Essential (primary) hypertension: Secondary | ICD-10-CM

## 2013-01-27 DIAGNOSIS — E785 Hyperlipidemia, unspecified: Secondary | ICD-10-CM

## 2013-01-27 DIAGNOSIS — D494 Neoplasm of unspecified behavior of bladder: Secondary | ICD-10-CM

## 2013-01-27 DIAGNOSIS — K5900 Constipation, unspecified: Secondary | ICD-10-CM

## 2013-01-29 NOTE — Progress Notes (Signed)
         PROGRESS NOTE  DATE: 01-27-13  FACILITY: Nursing Home Location: [facility]  LEVEL OF CARE: ALF (13)  Routine Visit  CHIEF COMPLAINT:  Manage hypertension, hyperlipidemia and constipation  HISTORY OF PRESENT ILLNESS:  REASSESSMENT OF ONGOING PROBLEM(S):  HTN: Pt 's HTN remains stable.  Denies CP, sob, DOE, pedal edema, headaches, dizziness or visual disturbances.  No complications from the medications currently being used.  Last BP : 122/72, 128/64, 152/72, 118/66, 126/78, 122/64, 130/64  HYPERLIPIDEMIA: No complications from the medications presently being used. Last fasting lipid panel showed : HDL 33 otherwise fostering lipid panel normal in 7/14  CONSTIPATION: The constipation remains stable. No complications from the medications presently being used. Patient denies ongoing constipation, abdominal pain, nausea or vomiting.  PAST MEDICAL HISTORY : Reviewed.  No changes.  CURRENT MEDICATIONS: Reviewed per [facility]  REVIEW OF SYSTEMS:  GENERAL: no change in appetite, no fatigue, no weight changes, no fever, chills or weakness RESPIRATORY: no cough, SOB, DOE, wheezing, hemoptysis CARDIAC: no chest pain, edema or palpitations GI: no abdominal pain, diarrhea, constipation, heart burn, nausea or vomiting  PHYSICAL EXAMINATION  VS:  T 96.5       P 72     RR 18      BP 130/64    POX %     WT (Lb) 211  GENERAL: no acute distress, moderately obese body habitus EYES: conjunctivae normal, sclerae normal, normal eye lids NECK: supple, trachea midline, no neck masses, no thyroid tenderness, no thyromegaly LYMPHATICS: no LAN in the neck, no supraclavicular LAN RESPIRATORY: breathing is even & unlabored, BS CTAB CARDIAC: RRR, no murmur,no extra heart sounds, no edema GI: abdomen soft, normal BS, no masses, no tenderness, no hepatomegaly, no splenomegaly PSYCHIATRIC: the patient is alert & oriented to person, affect & behavior appropriate  LABS/RADIOLOGY:  12-14  scrotal ultrasound-bilateral hydroceles  9-14 hemoglobin A1c 5.7 8-14 CBC normal, TSH 3.63  7/14 glucose 140 otherwise CMP normal 6/14 CBC normal  ASSESSMENT/PLAN:  Hypertension-well controlled Hyperlipidemia on-well-controlled. Check fasting lipid panel Constipation-well-controlled. 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 Check CMP  CPT CODE: 10932

## 2013-02-11 ENCOUNTER — Encounter: Payer: Self-pay | Admitting: Gastroenterology

## 2013-03-04 ENCOUNTER — Encounter: Payer: Self-pay | Admitting: *Deleted

## 2013-05-11 ENCOUNTER — Non-Acute Institutional Stay: Payer: PRIVATE HEALTH INSURANCE | Admitting: Internal Medicine

## 2013-05-11 DIAGNOSIS — I10 Essential (primary) hypertension: Secondary | ICD-10-CM

## 2013-05-11 DIAGNOSIS — K5900 Constipation, unspecified: Secondary | ICD-10-CM

## 2013-05-11 DIAGNOSIS — D494 Neoplasm of unspecified behavior of bladder: Secondary | ICD-10-CM

## 2013-05-11 DIAGNOSIS — E785 Hyperlipidemia, unspecified: Secondary | ICD-10-CM

## 2013-05-13 NOTE — Progress Notes (Signed)
          PROGRESS NOTE  DATE: 05-11-13  FACILITY: Nursing Home Location: [facility]  LEVEL OF CARE: ALF (13)  Routine Visit  CHIEF COMPLAINT:  Manage hypertension, hyperlipidemia and constipation  HISTORY OF PRESENT ILLNESS:  REASSESSMENT OF ONGOING PROBLEM(S):  HTN: Pt 's HTN remains stable.  Denies CP, sob, DOE, pedal edema, headaches, dizziness or visual disturbances.  No complications from the medications currently being used.  Last BP : 122/72, 128/64, 152/72, 118/66, 126/78, 122/64, 130/64, 124/72  HYPERLIPIDEMIA: No complications from the medications presently being used. Last fasting lipid panel showed : HDL 33 otherwise fostering lipid panel normal in 7/14, in 1-15 HDL 34 otherwise fostering lipid panel normal  CONSTIPATION: The constipation remains stable. No complications from the medications presently being used. Patient denies ongoing constipation, abdominal pain, nausea or vomiting.  PAST MEDICAL HISTORY : Reviewed.  No changes.  CURRENT MEDICATIONS: Reviewed per [facility]  REVIEW OF SYSTEMS:  GENERAL: no change in appetite, no fatigue, no weight changes, no fever, chills or weakness RESPIRATORY: no cough, SOB, DOE, wheezing, hemoptysis CARDIAC: no chest pain, edema or palpitations GI: no abdominal pain, diarrhea, constipation, heart burn, nausea or vomiting  PHYSICAL EXAMINATION  VS:  See vital signs section  GENERAL: no acute distress, moderately obese body habitus EYES: conjunctivae normal, sclerae normal, normal eye lids NECK: supple, trachea midline, no neck masses, no thyroid tenderness, no thyromegaly LYMPHATICS: no LAN in the neck, no supraclavicular LAN RESPIRATORY: breathing is even & unlabored, BS CTAB CARDIAC: RRR, no murmur,no extra heart sounds, no edema GI: abdomen soft, normal BS, no masses, no tenderness, no hepatomegaly, no splenomegaly PSYCHIATRIC: the patient is alert & oriented to person, affect & behavior  appropriate  LABS/RADIOLOGY: 4-15 CBC normal, glucose 125 otherwise CMP normal, hemoglobin A1c 5.6, TSH 2.8 12-14 scrotal ultrasound-bilateral hydroceles  9-14 hemoglobin A1c 5.7 8-14 CBC normal, TSH 3.63  7/14 glucose 140 otherwise CMP normal 6/14 CBC normal  ASSESSMENT/PLAN:  Hypertension-well controlled Hyperlipidemia on-well-controlled.  Constipation-well-controlled. 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  CPT CODE: 20254  Huxley Vanwagoner Y. Durwin Reges, [place] [phone]

## 2013-05-30 ENCOUNTER — Non-Acute Institutional Stay: Payer: PRIVATE HEALTH INSURANCE | Admitting: Internal Medicine

## 2013-05-30 DIAGNOSIS — D494 Neoplasm of unspecified behavior of bladder: Secondary | ICD-10-CM

## 2013-05-30 DIAGNOSIS — I10 Essential (primary) hypertension: Secondary | ICD-10-CM

## 2013-05-30 DIAGNOSIS — E785 Hyperlipidemia, unspecified: Secondary | ICD-10-CM

## 2013-05-30 DIAGNOSIS — K5900 Constipation, unspecified: Secondary | ICD-10-CM

## 2013-05-30 NOTE — Progress Notes (Signed)
           PROGRESS NOTE  DATE: 05-30-13  FACILITY: Nursing Home Location: [facility]  LEVEL OF CARE: ALF (13)  Routine Visit  CHIEF COMPLAINT:  Manage hypertension, hyperlipidemia and constipation  HISTORY OF PRESENT ILLNESS:  REASSESSMENT OF ONGOING PROBLEM(S):  HTN: Pt 's HTN remains stable.  Denies CP, sob, DOE, pedal edema, headaches, dizziness or visual disturbances.  No complications from the medications currently being used.  Last BP : 122/72, 128/64, 152/72, 118/66, 126/78, 122/64, 130/64, 124/72, 138/76  HYPERLIPIDEMIA: No complications from the medications presently being used. Last fasting lipid panel showed : HDL 33 otherwise fostering lipid panel normal in 7/14, in 1-15 HDL 34 otherwise fostering lipid panel normal  CONSTIPATION: The constipation remains stable. No complications from the medications presently being used. Patient denies ongoing constipation, abdominal pain, nausea or vomiting.  PAST MEDICAL HISTORY : Reviewed.  No changes.  CURRENT MEDICATIONS: Reviewed per [facility]  REVIEW OF SYSTEMS:  GENERAL: no change in appetite, no fatigue, no weight changes, no fever, chills or weakness RESPIRATORY: no cough, SOB, DOE, wheezing, hemoptysis CARDIAC: no chest pain, edema or palpitations GI: no abdominal pain, diarrhea, constipation, heart burn, nausea or vomiting  PHYSICAL EXAMINATION  VS:  See vital signs section  GENERAL: no acute distress, moderately obese body habitus EYES: conjunctivae normal, sclerae normal, normal eye lids NECK: supple, trachea midline, no neck masses, no thyroid tenderness, no thyromegaly LYMPHATICS: no LAN in the neck, no supraclavicular LAN RESPIRATORY: breathing is even & unlabored, BS CTAB CARDIAC: RRR, no murmur,no extra heart sounds, no edema GI: abdomen soft, normal BS, no masses, no tenderness, no hepatomegaly, no splenomegaly PSYCHIATRIC: the patient is alert & oriented to person, affect & behavior  appropriate  LABS/RADIOLOGY: 4-15 CBC normal, glucose 125 otherwise CMP normal, hemoglobin A1c 5.6, TSH 2.8 12-14 scrotal ultrasound-bilateral hydroceles  9-14 hemoglobin A1c 5.7 8-14 CBC normal, TSH 3.63  7/14 glucose 140 otherwise CMP normal 6/14 CBC normal  ASSESSMENT/PLAN:  Hypertension-well controlled Hyperlipidemia-well-controlled.  Constipation-well-controlled. 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  CPT CODE: 29562  Gayani Y. Durwin Reges, [place] [phone]

## 2013-06-27 ENCOUNTER — Non-Acute Institutional Stay: Payer: PRIVATE HEALTH INSURANCE | Admitting: Internal Medicine

## 2013-06-27 DIAGNOSIS — E785 Hyperlipidemia, unspecified: Secondary | ICD-10-CM

## 2013-06-27 DIAGNOSIS — D494 Neoplasm of unspecified behavior of bladder: Secondary | ICD-10-CM

## 2013-06-27 DIAGNOSIS — I10 Essential (primary) hypertension: Secondary | ICD-10-CM

## 2013-06-27 DIAGNOSIS — K5900 Constipation, unspecified: Secondary | ICD-10-CM

## 2013-06-27 NOTE — Progress Notes (Signed)
         PROGRESS NOTE  DATE: 06-27-13  FACILITY: Nursing Home Location: [facility]  LEVEL OF CARE: ALF (13)  Routine Visit  CHIEF COMPLAINT:  Manage hypertension, hyperlipidemia and constipation  HISTORY OF PRESENT ILLNESS:  REASSESSMENT OF ONGOING PROBLEM(S):  HTN: Pt 's HTN remains stable.  Denies CP, sob, DOE, pedal edema, headaches, dizziness or visual disturbances.  No complications from the medications currently being used.  Last BP : 122/72, 128/64, 152/72, 118/66, 126/78, 122/64, 130/64, 124/72, 138/76, 138/84  HYPERLIPIDEMIA: No complications from the medications presently being used. Last fasting lipid panel showed : HDL 33 otherwise fostering lipid panel normal in 7/14, in 1-15 HDL 34 otherwise fostering lipid panel normal  CONSTIPATION: The constipation remains stable. No complications from the medications presently being used. Patient denies ongoing constipation, abdominal pain, nausea or vomiting.  PAST MEDICAL HISTORY : Reviewed.  No changes.  CURRENT MEDICATIONS: Reviewed per [facility]  REVIEW OF SYSTEMS:  GENERAL: no change in appetite, no fatigue, no weight changes, no fever, chills or weakness RESPIRATORY: no cough, SOB, DOE, wheezing, hemoptysis CARDIAC: no chest pain, edema or palpitations GI: no abdominal pain, diarrhea, constipation, heart burn, nausea or vomiting  PHYSICAL EXAMINATION  VS:  See vital signs section  GENERAL: no acute distress, moderately obese body habitus EYES: conjunctivae normal, sclerae normal, normal eye lids NECK: supple, trachea midline, no neck masses, no thyroid tenderness, no thyromegaly LYMPHATICS: no LAN in the neck, no supraclavicular LAN RESPIRATORY: breathing is even & unlabored, BS CTAB CARDIAC: RRR, no murmur,no extra heart sounds, no edema GI: abdomen soft, normal BS, no masses, no tenderness, no hepatomegaly, no splenomegaly PSYCHIATRIC: the patient is alert & oriented to person, affect & behavior  appropriate  LABS/RADIOLOGY: 4-15 CBC normal, glucose 125 otherwise CMP normal, hemoglobin A1c 5.6, TSH 2.8 12-14 scrotal ultrasound-bilateral hydroceles  9-14 hemoglobin A1c 5.7 8-14 CBC normal, TSH 3.63  7/14 glucose 140 otherwise CMP normal 6/14 CBC normal  ASSESSMENT/PLAN:  Hypertension-well controlled Hyperlipidemia-well-controlled.  Constipation-well-controlled. 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  CPT CODE: 39030  Gayani Y. Durwin Reges, [place] [phone]

## 2013-07-13 ENCOUNTER — Encounter: Payer: Self-pay | Admitting: Gastroenterology

## 2013-07-25 ENCOUNTER — Non-Acute Institutional Stay: Payer: PRIVATE HEALTH INSURANCE | Admitting: Internal Medicine

## 2013-07-25 DIAGNOSIS — I10 Essential (primary) hypertension: Secondary | ICD-10-CM

## 2013-07-25 DIAGNOSIS — E785 Hyperlipidemia, unspecified: Secondary | ICD-10-CM

## 2013-07-25 DIAGNOSIS — D494 Neoplasm of unspecified behavior of bladder: Secondary | ICD-10-CM

## 2013-07-25 DIAGNOSIS — K5900 Constipation, unspecified: Secondary | ICD-10-CM

## 2013-07-25 NOTE — Progress Notes (Signed)
         PROGRESS NOTE  DATE: 07-25-13  FACILITY: Nursing Home Location: [facility]  LEVEL OF CARE: ALF (13)  Routine Visit  CHIEF COMPLAINT:  Manage hypertension, hyperlipidemia and constipation  HISTORY OF PRESENT ILLNESS:  REASSESSMENT OF ONGOING PROBLEM(S):  HTN: Pt 's HTN remains stable.  Denies CP, sob, DOE, pedal edema, headaches, dizziness or visual disturbances.  No complications from the medications currently being used.  Last BP : 122/72, 128/64, 152/72, 118/66, 126/78, 122/64, 130/64, 124/72, 138/76, 138/84, 134/68  HYPERLIPIDEMIA: No complications from the medications presently being used. Last fasting lipid panel showed : HDL 33 otherwise fostering lipid panel normal in 7/14, in 1-15 HDL 34 otherwise fostering lipid panel normal  CONSTIPATION: The constipation remains stable. No complications from the medications presently being used. Patient denies ongoing constipation, abdominal pain, nausea or vomiting.  PAST MEDICAL HISTORY : Reviewed.  No changes.  CURRENT MEDICATIONS: Reviewed per [facility]  REVIEW OF SYSTEMS:  GENERAL: no change in appetite, no fatigue, no weight changes, no fever, chills or weakness RESPIRATORY: no cough, SOB, DOE, wheezing, hemoptysis CARDIAC: no chest pain, edema or palpitations GI: no abdominal pain, diarrhea, constipation, heart burn, nausea or vomiting  PHYSICAL EXAMINATION  VS:  See vital signs section  GENERAL: no acute distress, moderately obese body habitus EYES: conjunctivae normal, sclerae normal, normal eye lids NECK: supple, trachea midline, no neck masses, no thyroid tenderness, no thyromegaly LYMPHATICS: no LAN in the neck, no supraclavicular LAN RESPIRATORY: breathing is even & unlabored, BS CTAB CARDIAC: RRR, no murmur,no extra heart sounds, no edema GI: abdomen soft, normal BS, no masses, no tenderness, no hepatomegaly, no splenomegaly PSYCHIATRIC: the patient is alert & oriented to person, affect &  behavior appropriate  LABS/RADIOLOGY: 4-15 CBC normal, glucose 125 otherwise CMP normal, hemoglobin A1c 5.6, TSH 2.8 12-14 scrotal ultrasound-bilateral hydroceles  9-14 hemoglobin A1c 5.7 8-14 CBC normal, TSH 3.63  7/14 glucose 140 otherwise CMP normal 6/14 CBC normal  ASSESSMENT/PLAN:  Hypertension-well controlled Hyperlipidemia-well-controlled. Check fasting lipid panel. Constipation-well-controlled. 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 Right temple lesion-dermatology consult pending  CPT CODE: 97353  Lijah Bourque Y. Durwin Reges, [place] [phone]

## 2013-08-29 ENCOUNTER — Non-Acute Institutional Stay: Payer: PRIVATE HEALTH INSURANCE | Admitting: Internal Medicine

## 2013-08-29 DIAGNOSIS — K5900 Constipation, unspecified: Secondary | ICD-10-CM

## 2013-08-29 DIAGNOSIS — E785 Hyperlipidemia, unspecified: Secondary | ICD-10-CM

## 2013-08-29 DIAGNOSIS — I10 Essential (primary) hypertension: Secondary | ICD-10-CM

## 2013-08-29 DIAGNOSIS — D494 Neoplasm of unspecified behavior of bladder: Secondary | ICD-10-CM

## 2013-08-29 NOTE — Progress Notes (Signed)
         PROGRESS NOTE  DATE: 08-29-13  FACILITY: Nursing Home Location: [facility]  LEVEL OF CARE: ALF (13)  Routine Visit  CHIEF COMPLAINT:  Manage hypertension, hyperlipidemia and constipation  HISTORY OF PRESENT ILLNESS:  REASSESSMENT OF ONGOING PROBLEM(S):  HTN: Pt 's HTN remains stable.  Denies CP, sob, DOE, pedal edema, headaches, dizziness or visual disturbances.  No complications from the medications currently being used.  Last BP : 122/72, 128/64, 152/72, 118/66, 126/78, 122/64, 130/64, 124/72, 138/76, 138/84, 134/68, 136/64  HYPERLIPIDEMIA: No complications from the medications presently being used. Last fasting lipid panel showed : HDL 33 otherwise fostering lipid panel normal in 7/14, in 1-15 HDL 34 otherwise fostering lipid panel normal  CONSTIPATION: The constipation remains stable. No complications from the medications presently being used. Patient denies ongoing constipation, abdominal pain, nausea or vomiting.  PAST MEDICAL HISTORY : Reviewed.  No changes.  CURRENT MEDICATIONS: Reviewed per [facility]  REVIEW OF SYSTEMS:  GENERAL: no change in appetite, no fatigue, no weight changes, no fever, chills or weakness RESPIRATORY: no cough, SOB, DOE, wheezing, hemoptysis CARDIAC: no chest pain, edema or palpitations GI: no abdominal pain, diarrhea, constipation, heart burn, nausea or vomiting  PHYSICAL EXAMINATION  VS:  See vital signs section  GENERAL: no acute distress, moderately obese body habitus EYES: conjunctivae normal, sclerae normal, normal eye lids NECK: supple, trachea midline, no neck masses, no thyroid tenderness, no thyromegaly LYMPHATICS: no LAN in the neck, no supraclavicular LAN RESPIRATORY: breathing is even & unlabored, BS CTAB CARDIAC: RRR, no murmur,no extra heart sounds, no edema GI: abdomen soft, normal BS, no masses, no tenderness, no hepatomegaly, no splenomegaly PSYCHIATRIC: the patient is alert & oriented to person, affect  & behavior appropriate  LABS/RADIOLOGY: 4-15 CBC normal, glucose 125 otherwise CMP normal, hemoglobin A1c 5.6, TSH 2.8 12-14 scrotal ultrasound-bilateral hydroceles  9-14 hemoglobin A1c 5.7 8-14 CBC normal, TSH 3.63  7/14 glucose 140 otherwise CMP normal 6/14 CBC normal  ASSESSMENT/PLAN:  Hypertension-well controlled Hyperlipidemia-well-controlled. Check fasting lipid panel. Constipation-well-controlled. 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  CPT CODE: 99242  Gayani Y. Durwin Reges, [place] [phone]

## 2013-09-07 ENCOUNTER — Ambulatory Visit (AMBULATORY_SURGERY_CENTER): Payer: PRIVATE HEALTH INSURANCE | Admitting: *Deleted

## 2013-09-07 VITALS — Ht 71.0 in | Wt 214.4 lb

## 2013-09-07 DIAGNOSIS — Z860100 Personal history of colon polyps, unspecified: Secondary | ICD-10-CM

## 2013-09-07 NOTE — Progress Notes (Signed)
No allergies to eggs or soy. No problems with anesthesia.  Pt not given Emmi instructions for colonoscopy; no computer access  No oxygen use  No diet drug use  Pt [facility].  Pt ambulates without assistance.  i talked with nurse Margarita Mail at [facility].  He says pt is his own responsible party.  Reviewed current medications and medical problems with the nurse.  Pt answers all questions appropriately and knows what procedure he is having

## 2013-09-21 ENCOUNTER — Ambulatory Visit (AMBULATORY_SURGERY_CENTER): Payer: PRIVATE HEALTH INSURANCE | Admitting: Gastroenterology

## 2013-09-21 ENCOUNTER — Encounter: Payer: Self-pay | Admitting: Gastroenterology

## 2013-09-21 VITALS — BP 138/62 | HR 56 | Temp 98.3°F | Resp 22 | Ht 71.0 in | Wt 214.0 lb

## 2013-09-21 DIAGNOSIS — D126 Benign neoplasm of colon, unspecified: Secondary | ICD-10-CM

## 2013-09-21 DIAGNOSIS — D129 Benign neoplasm of anus and anal canal: Secondary | ICD-10-CM

## 2013-09-21 DIAGNOSIS — Z860100 Personal history of colon polyps, unspecified: Secondary | ICD-10-CM

## 2013-09-21 DIAGNOSIS — D125 Benign neoplasm of sigmoid colon: Secondary | ICD-10-CM

## 2013-09-21 DIAGNOSIS — D124 Benign neoplasm of descending colon: Secondary | ICD-10-CM

## 2013-09-21 DIAGNOSIS — D128 Benign neoplasm of rectum: Secondary | ICD-10-CM

## 2013-09-21 DIAGNOSIS — D123 Benign neoplasm of transverse colon: Secondary | ICD-10-CM

## 2013-09-21 MED ORDER — SODIUM CHLORIDE 0.9 % IV SOLN: 500.0000 mL | INTRAVENOUS | Status: DC

## 2013-09-21 NOTE — Progress Notes (Signed)
Called to room to assist during endoscopic procedure.  Patient ID and intended procedure confirmed with present staff. Received instructions for my participation in the procedure from the performing physician.  

## 2013-09-21 NOTE — Progress Notes (Signed)
Patient denies any allergies to eggs or soy. Patient denies any problems with anesthesia/sedation. Patient denies any oxygen use at home and does not take any diet/weight loss medications.  

## 2013-09-21 NOTE — Progress Notes (Signed)
Report to PACU, RN, vss, BBS= Clear.  

## 2013-09-21 NOTE — Op Note (Signed)
[place]  [address]. [place], [address]   COLONOSCOPY PROCEDURE REPORT  PATIENT: [name] [name], [name] [name]  MR#: [id number] BIRTHDATE: Oct 08, 1950 , 63  yrs. old GENDER: Male ENDOSCOPIST: Inda Castle, MD REFERRED [id number] Dasanayaka, M.D. PROCEDURE DATE:  09/21/2013 PROCEDURE:   Colonoscopy with snare polypectomy, Colonoscopy with cold biopsy polypectomy, and Submucosal injection, any substance First Screening Colonoscopy - Avg.  risk and is 50 yrs.  old or older - No.  Prior Negative Screening - Now for repeat screening. N/A  History of Adenoma - Now for follow-up colonoscopy & has been > or = to 3 yrs.  Yes hx of adenoma.  Has been 3 or more years since last colonoscopy.  Polyps Removed Today? Yes. ASA CLASS:   Class II INDICATIONS:Patient's immediate family history of colon cancer and Patient's personal history of colon polyps 2010 MEDICATIONS: MAC sedation, administered by CRNA and propofol (Diprivan) 400mg  IV  DESCRIPTION OF PROCEDURE:   After the risks benefits and alternatives of the procedure were thoroughly explained, informed consent was obtained.  A digital rectal exam revealed no abnormalities of the rectum.   The LB [id number]  endoscope was introduced through the anus and advanced to the cecum, which was identified by both the appendix and ileocecal valve. No adverse events experienced.   The quality of the prep was Suprep good  The instrument was then slowly withdrawn as the colon was fully examined.      COLON FINDINGS: A pedunculated polyp was found in the sigmoid colonat approximately 30 cm in the anus.  The polyp measured approximately 5 cm in size.  The stalk was injected with 3 cc of epinephrine in the polyp body was injected with another 2 cc of epinephrine.  The polyp was partially removed with a hot polypectomy snare.  Initial attempts to remove the polyp in its entirety were unsuccessful because I was unable to cut through  the stalk of the polyp despite increasing wattage to 30.  The surrounding area was 2 with to see a spot..  2 polyps were identified in the occipital transverse colon measuring one and 3 mm, respectively.  The smaller polyp was removed with cold biopsy forceps and a large polyp with a cold polypectomy snare.  Specimens were sent to pathology.  A 3 mm sessile polyp was identified in the mid transverse colon removed with cold polypectomy snare and submitted to pathology.  A 4 mm sessile polyp was removed with cold polypectomy snare in the rectal vault and submitted to pathology. The remainder the exam is normal    to flex view of the rectal vault was normal.  The time to cecum=2 minutes 05 seconds. Withdrawal time=38 minutes 05 seconds.  The scope was withdrawn and the procedure completed. COMPLICATIONS: There were no complications.  ENDOSCOPIC IMPRESSION: Large, pedunculated polyp was found in the sigmoid colon Colonic polyposis  RECOMMENDATIONS: Await biopsy results   eSigned:  Inda Castle, MD 09/21/2013 11:36 AM   cc:   PATIENT NAME:  Aidin, Doane MR#: [id number]

## 2013-09-21 NOTE — Patient Instructions (Addendum)
No NSAIDs for 2 weekYOU HAD AN ENDOSCOPIC PROCEDURE TODAY AT THE [facility]: Refer to the procedure report that was given to you for any specific questions about what was found during the examination.  If the procedure report does not answer your questions, please call your gastroenterologist to clarify.  If you requested that your care partner not be given the details of your procedure findings, then the procedure report has been included in a sealed envelope for you to review at your convenience later.  YOU SHOULD EXPECT: Some feelings of bloating in the abdomen. Passage of more gas than usual.  Walking can help get rid of the air that was put into your GI tract during the procedure and reduce the bloating. If you had a lower endoscopy (such as a colonoscopy or flexible sigmoidoscopy) you may notice spotting of blood in your stool or on the toilet paper. If you underwent a bowel prep for your procedure, then you may not have a normal bowel movement for a few days.  DIET: Your first meal following the procedure should be a light meal and then it is ok to progress to your normal diet.  A half-sandwich or bowl of soup is an example of a good first meal.  Heavy or fried foods are harder to digest and may make you feel nauseous or bloated.  Likewise meals heavy in dairy and vegetables can cause extra gas to form and this can also increase the bloating.  Drink plenty of fluids but you should avoid alcoholic beverages for 24 hours.  ACTIVITY: Your care partner should take you home directly after the procedure.  You should plan to take it easy, moving slowly for the rest of the day.  You can resume normal activity the day after the procedure however you should NOT DRIVE or use heavy machinery for 24 hours (because of the sedation medicines used during the test).    SYMPTOMS TO REPORT IMMEDIATELY: A gastroenterologist can be reached at any hour.  During normal business hours, 8:30 AM to 5:00 PM  Monday through Friday, call [phone].  After hours and on weekends, please call the GI answering service at [phone] who will take a message and have the physician on call contact you.   Following lower endoscopy (colonoscopy or flexible sigmoidoscopy):  Excessive amounts of blood in the stool  Significant tenderness or worsening of abdominal pains  Swelling of the abdomen that is new, acute  Fever of 100F or higher    FOLLOW UP: If any biopsies were taken you will be contacted by phone or by letter within the next 1-3 weeks.  Call your gastroenterologist if you have not heard about the biopsies in 3 weeks.  Our staff will call the home number listed on your records the next business day following your procedure to check on you and address any questions or concerns that you may have at that time regarding the information given to you following your procedure. This is a courtesy call and so if there is no answer at the home number and we have not heard from you through the emergency physician on call, we will assume that you have returned to your regular daily activities without incident.  SIGNATURES/CONFIDENTIALITY: You and/or your care partner have signed paperwork which will be entered into your electronic medical record.  These signatures attest to the fact that that the information above on your After Visit Summary has been reviewed and is understood.  Full  responsibility of the confidentiality of this discharge information lies with you and/or your care-partner.  Polyp information given.  Await biopsy results.

## 2013-09-22 ENCOUNTER — Telehealth: Payer: Self-pay | Admitting: *Deleted

## 2013-09-22 NOTE — Telephone Encounter (Signed)
Left message with an employee at [facility] that we were checking on pt to follow up after procedure yesterday . Nurses were  in shift change report.

## 2013-09-26 ENCOUNTER — Non-Acute Institutional Stay: Payer: PRIVATE HEALTH INSURANCE | Admitting: Internal Medicine

## 2013-09-26 DIAGNOSIS — I10 Essential (primary) hypertension: Secondary | ICD-10-CM

## 2013-09-26 DIAGNOSIS — D494 Neoplasm of unspecified behavior of bladder: Secondary | ICD-10-CM

## 2013-09-26 DIAGNOSIS — K5900 Constipation, unspecified: Secondary | ICD-10-CM

## 2013-09-26 DIAGNOSIS — E785 Hyperlipidemia, unspecified: Secondary | ICD-10-CM

## 2013-09-26 NOTE — Progress Notes (Signed)
         PROGRESS NOTE  DATE: 09-26-13  FACILITY: Nursing Home Location: [facility]  LEVEL OF CARE: ALF (13)  Routine Visit  CHIEF COMPLAINT:  Manage hypertension, hyperlipidemia and constipation  HISTORY OF PRESENT ILLNESS:  REASSESSMENT OF ONGOING PROBLEM(S):  HTN: Pt 's HTN remains stable.  Denies CP, sob, DOE, pedal edema, headaches, dizziness or visual disturbances.  No complications from the medications currently being used.  Last BP : 122/72, 128/64, 152/72, 118/66, 126/78, 122/64, 130/64, 124/72, 138/76, 138/84, 134/68, 136/64, 136/72  HYPERLIPIDEMIA: No complications from the medications presently being used. Last fasting lipid panel showed : HDL 33 otherwise fostering lipid panel normal in 7/14, in 1-15 HDL 34 otherwise fostering lipid panel normal, in 8-15 HDL 36 otherwise fasting lipid panel normal  CONSTIPATION: The constipation remains stable. No complications from the medications presently being used. Patient denies ongoing constipation, abdominal pain, nausea or vomiting.  PAST MEDICAL HISTORY : Reviewed.  No changes.  CURRENT MEDICATIONS: Reviewed per [facility]  REVIEW OF SYSTEMS:  GENERAL: no change in appetite, no fatigue, no weight changes, no fever, chills or weakness RESPIRATORY: no cough, SOB, DOE, wheezing, hemoptysis CARDIAC: no chest pain, edema or palpitations GI: no abdominal pain, diarrhea, constipation, heart burn, nausea or vomiting  PHYSICAL EXAMINATION  VS:  See vital signs section  GENERAL: no acute distress, moderately obese body habitus EYES: conjunctivae normal, sclerae normal, normal eye lids NECK: supple, trachea midline, no neck masses, no thyroid tenderness, no thyromegaly LYMPHATICS: no LAN in the neck, no supraclavicular LAN RESPIRATORY: breathing is even & unlabored, BS CTAB CARDIAC: RRR, no murmur,no extra heart sounds, no edema GI: abdomen soft, normal BS, no masses, no tenderness, no hepatomegaly, no  splenomegaly PSYCHIATRIC: the patient is alert & oriented to person, affect & behavior appropriate  LABS/RADIOLOGY: 7-15 CBC and CMP normal, TSH 3.51 4-15 CBC normal, glucose 125 otherwise CMP normal, hemoglobin A1c 5.6, TSH 2.8 12-14 scrotal ultrasound-bilateral hydroceles  9-14 hemoglobin A1c 5.7 8-14 CBC normal, TSH 3.63  7/14 glucose 140 otherwise CMP normal 6/14 CBC normal  ASSESSMENT/PLAN:  Hypertension-well controlled Hyperlipidemia-well-controlled.  Constipation-well-controlled. 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 GERD-Prilosec started  CPT CODE: 54270  Edgar Frisk. Durwin Reges, [place] [phone]

## 2013-10-17 ENCOUNTER — Telehealth: Payer: Self-pay | Admitting: Gastroenterology

## 2013-10-17 NOTE — Telephone Encounter (Signed)
[name] [name] on the [place] of the biopsy results. Also reminded her of the follow up appointment 11/21/13 for the patient.

## 2013-10-20 ENCOUNTER — Encounter: Payer: Self-pay | Admitting: Gastroenterology

## 2013-10-24 ENCOUNTER — Non-Acute Institutional Stay: Payer: PRIVATE HEALTH INSURANCE | Admitting: Internal Medicine

## 2013-10-24 DIAGNOSIS — K5900 Constipation, unspecified: Secondary | ICD-10-CM

## 2013-10-24 DIAGNOSIS — I10 Essential (primary) hypertension: Secondary | ICD-10-CM

## 2013-10-24 DIAGNOSIS — E785 Hyperlipidemia, unspecified: Secondary | ICD-10-CM | POA: Insufficient documentation

## 2013-10-24 DIAGNOSIS — D494 Neoplasm of unspecified behavior of bladder: Secondary | ICD-10-CM

## 2013-10-24 NOTE — Addendum Note (Signed)
Addended by: Merlene Laughter on: 10/24/2013 06:47 PM   Modules accepted: Level of Service

## 2013-10-24 NOTE — Progress Notes (Signed)
         PROGRESS NOTE  DATE: 10-24-13  FACILITY: Nursing Home Location: [facility]  LEVEL OF CARE: ALF (13)  Routine Visit  CHIEF COMPLAINT:  Manage hypertension, hyperlipidemia and constipation  HISTORY OF PRESENT ILLNESS:  REASSESSMENT OF ONGOING PROBLEM(S):  HTN: Pt 's HTN remains stable.  Denies CP, sob, DOE, pedal edema, headaches, dizziness or visual disturbances.  No complications from the medications currently being used.  Last BP : 122/72, 128/64, 152/72, 118/66, 126/78, 122/64, 130/64, 124/72, 138/76, 138/84, 134/68, 136/64, 136/72, 124/73  HYPERLIPIDEMIA: No complications from the medications presently being used. Last fasting lipid panel showed : HDL 33 otherwise fostering lipid panel normal in 7/14, in 1-15 HDL 34 otherwise fostering lipid panel normal, in 8-15 HDL 36 otherwise fasting lipid panel normal  CONSTIPATION: The constipation remains stable. No complications from the medications presently being used. Patient denies ongoing constipation, abdominal pain, nausea or vomiting.  PAST MEDICAL HISTORY : Reviewed.  No changes.  CURRENT MEDICATIONS: Reviewed per [facility]  REVIEW OF SYSTEMS:  GENERAL: no change in appetite, no fatigue, no weight changes, no fever, chills or weakness RESPIRATORY: no cough, SOB, DOE, wheezing, hemoptysis CARDIAC: no chest pain, edema or palpitations GI: no abdominal pain, diarrhea, constipation, heart burn, nausea or vomiting  PHYSICAL EXAMINATION  VS:  See vital signs section  GENERAL: no acute distress, moderately obese body habitus EYES: conjunctivae normal, sclerae normal, normal eye lids NECK: supple, trachea midline, no neck masses, no thyroid tenderness, no thyromegaly LYMPHATICS: no LAN in the neck, no supraclavicular LAN RESPIRATORY: breathing is even & unlabored, BS CTAB CARDIAC: RRR, no murmur,no extra heart sounds, no edema GI: abdomen soft, normal BS, no masses, no tenderness, no hepatomegaly, no  splenomegaly PSYCHIATRIC: the patient is alert & oriented to person, affect & behavior appropriate  LABS/RADIOLOGY: 8-15 PSA 1.2 7-15 CBC and CMP normal, TSH 3.51 4-15 CBC normal, glucose 125 otherwise CMP normal, hemoglobin A1c 5.6, TSH 2.8 12-14 scrotal ultrasound-bilateral hydroceles  9-14 hemoglobin A1c 5.7 8-14 CBC normal, TSH 3.63  7/14 glucose 140 otherwise CMP normal 6/14 CBC normal  ASSESSMENT/PLAN:  Hypertension-well controlled Hyperlipidemia-well-controlled.  Constipation-well-controlled. 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 GERD-on Prilosec  CPT CODE: 80165  Edgar Frisk. Durwin Reges, [place] [phone]

## 2013-11-30 ENCOUNTER — Encounter: Payer: Self-pay | Admitting: Gastroenterology

## 2013-11-30 ENCOUNTER — Encounter (HOSPITAL_COMMUNITY): Payer: Self-pay | Admitting: *Deleted

## 2013-11-30 ENCOUNTER — Ambulatory Visit (INDEPENDENT_AMBULATORY_CARE_PROVIDER_SITE_OTHER): Payer: PRIVATE HEALTH INSURANCE | Admitting: Gastroenterology

## 2013-11-30 ENCOUNTER — Ambulatory Visit (HOSPITAL_COMMUNITY)
Admission: RE | Admit: 2013-11-30 | Discharge: 2013-11-30 | Disposition: A | Discharge status: Home / Self Care | Payer: PRIVATE HEALTH INSURANCE | Source: Ambulatory Visit | Attending: Gastroenterology | Admitting: Gastroenterology

## 2013-11-30 ENCOUNTER — Encounter (HOSPITAL_COMMUNITY)
Admission: RE | Disposition: A | Discharge status: Home / Self Care | Payer: Self-pay | Source: Ambulatory Visit | Attending: Gastroenterology

## 2013-11-30 DIAGNOSIS — K5790 Diverticulosis of intestine, part unspecified, without perforation or abscess without bleeding: Secondary | ICD-10-CM | POA: Insufficient documentation

## 2013-11-30 DIAGNOSIS — F70 Mild intellectual disabilities: Secondary | ICD-10-CM

## 2013-11-30 DIAGNOSIS — K635 Polyp of colon: Secondary | ICD-10-CM

## 2013-11-30 DIAGNOSIS — D124 Benign neoplasm of descending colon: Secondary | ICD-10-CM | POA: Diagnosis present

## 2013-11-30 DIAGNOSIS — D126 Benign neoplasm of colon, unspecified: Secondary | ICD-10-CM

## 2013-11-30 HISTORY — PX: HOT HEMOSTASIS: SHX5433

## 2013-11-30 HISTORY — PX: FLEXIBLE SIGMOIDOSCOPY: SHX5431

## 2013-11-30 SURGERY — SIGMOIDOSCOPY, FLEXIBLE
Anesthesia: Moderate Sedation

## 2013-11-30 MED ORDER — MIDAZOLAM HCL 10 MG/2ML IJ SOLN
INTRAMUSCULAR | Status: DC | PRN
  Administered 2013-11-30 (×2): 2 mg via INTRAVENOUS

## 2013-11-30 MED ORDER — SODIUM CHLORIDE 0.9 % IV SOLN
INTRAVENOUS | Status: DC
  Administered 2013-11-30: 500 mL via INTRAVENOUS

## 2013-11-30 MED ORDER — FLEET ENEMA 7-19 GM/118ML RE ENEM
2.0000 | ENEMA | Freq: Once | RECTAL | Status: AC
  Administered 2013-11-30: 2 via RECTAL

## 2013-11-30 MED ORDER — FENTANYL CITRATE 0.05 MG/ML IJ SOLN
INTRAMUSCULAR | Status: DC | PRN
  Administered 2013-11-30 (×2): 25 ug via INTRAVENOUS

## 2013-11-30 MED ORDER — FENTANYL CITRATE 0.05 MG/ML IJ SOLN
INTRAMUSCULAR | Status: AC
  Filled 2013-11-30: qty 2

## 2013-11-30 MED ORDER — SODIUM CHLORIDE 0.9 % IJ SOLN
INTRAMUSCULAR | Status: DC | PRN
  Administered 2013-11-30: 4 mL

## 2013-11-30 MED ORDER — FLEET ENEMA 7-19 GM/118ML RE ENEM
ENEMA | RECTAL | Status: AC
  Filled 2013-11-30: qty 2

## 2013-11-30 MED ORDER — EPINEPHRINE HCL 0.1 MG/ML IJ SOSY
PREFILLED_SYRINGE | INTRAMUSCULAR | Status: AC
  Filled 2013-11-30: qty 10

## 2013-11-30 MED ORDER — MIDAZOLAM HCL 10 MG/2ML IJ SOLN
INTRAMUSCULAR | Status: AC
  Filled 2013-11-30: qty 2

## 2013-11-30 NOTE — H&P (Signed)
[place], [address]   FLEXIBLE SIGMOIDOSCOPY PROCEDURE REPORT  PATIENT: [name] [name], [name] [name]  MR#: [id number] BIRTHDATE: 03/24/1950 , 3  yrs. old GENDER: male ENDOSCOPIST: Inda Castle, MD REFERRED BY: PROCEDURE DATE:  11/30/2013 PROCEDURE:   Sigmoidoscopy with snare ASA CLASS:   Class II INDICATIONS:therapy of colonic adenomatous polyp(s). MEDICATIONS: Versed 4 mg IV and Fentanyl 50 mcg IV  DESCRIPTION OF PROCEDURE:   After the risks benefits and alternatives of the procedure were thoroughly explained, informed consent was obtained.  Digital exam revealed no abnormalities of the rectum. The     endoscope was introduced through the anus  and advanced to the descending colon , The exam was Without limitations.    The quality of the prep was    .  The instrument was then slowly withdrawn as the mucosa was fully examined.         COLON FINDINGS: A pedunculated polyp measuring 5 cm in size was found in the descending colon.  The stalk was injected with 4 cc of epinephrine.  A large portion of the polyp was removed with snare cautery and was retrieved.  As of the polyp were removed piecemeal. Polyp remnants remained.  There are scattered diverticula in the descending colon  Retroflexed views revealed no abnormalities. The scope was then withdrawn from the patient and the procedure terminated.  COMPLICATIONS: There were no immediate complications.  ENDOSCOPIC IMPRESSION: Pedunculated polyp measuring 5 cm in size was found in the descending colon - incompletely removed  RECOMMENDATIONS: await pathology findings  REPEAT EXAM:  eSigned:  Inda Castle, MD 11/30/2013 5:19 PM   CC:

## 2013-11-30 NOTE — Patient Instructions (Signed)
Go to [facility] today endoscopy department at 2:30 today Separate instructions have been given

## 2013-11-30 NOTE — Discharge Instructions (Addendum)

## 2013-11-30 NOTE — Progress Notes (Signed)
_                                                                                                                History of Present Illness:  Mr. Chouinard a 63 year old white male with history of mental retardation, chronic liver disease and congestive heart failure, CVA, here following colonoscopy to discuss therapeutic options.  Screening colonoscopy demonstrated a large, pedunculated polyp in the sigmoid.  It was partially removed.  Allergy demonstrated benign adenomatous changes only.  He has no GI complaints.  Other adenomatous polyps were removed as well.   Past Medical History  Diagnosis Date  . Hypertension   . Hyperlipidemia   . Chronic kidney disease   . CHF (congestive heart failure)   . Allergy   . Bladder tumor   . CVA (cerebral infarction)     2008  . Cataract    Past Surgical History  Procedure Laterality Date  . Eye surgery    . Mouth surgery    . Cholecystectomy     family history includes Colon cancer in his father; Colon cancer (age of onset: 86) in his sister. Current Outpatient Prescriptions  Medication Sig Dispense Refill  . Cholecalciferol (VITAMIN D3) 50000 UNITS CAPS Take 50,000 Units by mouth every 30 (thirty) days. Take 1 capsule by mouth every month on the 11 th .    . docusate sodium (COLACE) 100 MG capsule Take 100 mg by mouth 2 (two) times daily. For constipation    . hydrochlorothiazide (HYDRODIURIL) 25 MG tablet Take 25 mg by mouth daily.    Marland Kitchen lisinopril (PRINIVIL,ZESTRIL) 10 MG tablet Take 10 mg by mouth daily.    Marland Kitchen omeprazole (PRILOSEC) 20 MG capsule Take 1 capsule by mouth daily.    . simvastatin (ZOCOR) 40 MG tablet Take 40 mg by mouth at bedtime.     No current facility-administered medications for this visit.   Allergies as of 11/30/2013  . (No Known Allergies)    reports that he quit smoking about 28 years ago. He has never used smokeless tobacco. He reports that he does not drink alcohol or use illicit drugs.   Review  of Systems: Pertinent positive and negative review of systems were noted in the above HPI section. All other review of systems were otherwise negative.  Vital signs were reviewed in today's medical record Physical Exam: General: Well developed , well nourished, no acute distress Skin: anicteric Head: Normocephalic and atraumatic Eyes:  sclerae anicteric, EOMI Ears: Normal auditory acuity Mouth: No deformity or lesions Neck: Supple, no masses or thyromegaly Lungs: Clear throughout to auscultation Heart: Regular rate and rhythm; no murmurs, rubs or bruits Abdomen: Soft, non tender and non distended. No masses, hepatosplenomegaly or hernias noted. Normal Bowel sounds Rectal:deferred Musculoskeletal: Symmetrical with no gross deformities  Skin: No lesions on visible extremities Pulses:  Normal pulses noted Extremities: No clubbing, cyanosis, edema or deformities noted Neurological: Alert oriented x 4, grossly nonfocal Cervical Nodes:  No significant cervical adenopathy Inguinal Nodes: No significant  inguinal adenopathy Psychological:  Alert and cooperative. Normal mood and affect  See Assessment and Plan under Problem List

## 2013-11-30 NOTE — Assessment & Plan Note (Signed)
Patient has colonic polyposis.  He has a large, pedunculated polyp in the sigmoid.  Biopsies showed adenomatous changes only. Risks including bleeding and perforation were discussed.  I explained that these risks are higher because of the size of the polyp.  Given the alternative of surgical resection the patient wishes to proceed with sigmoidoscopy and polyp removal.

## 2013-12-01 ENCOUNTER — Encounter (HOSPITAL_COMMUNITY): Payer: Self-pay | Admitting: Gastroenterology

## 2014-02-16 DIAGNOSIS — E785 Hyperlipidemia, unspecified: Secondary | ICD-10-CM | POA: Diagnosis not present

## 2014-02-16 DIAGNOSIS — K219 Gastro-esophageal reflux disease without esophagitis: Secondary | ICD-10-CM | POA: Diagnosis not present

## 2014-02-16 DIAGNOSIS — I10 Essential (primary) hypertension: Secondary | ICD-10-CM | POA: Diagnosis not present

## 2014-02-16 DIAGNOSIS — F70 Mild intellectual disabilities: Secondary | ICD-10-CM | POA: Diagnosis not present

## 2014-02-16 DIAGNOSIS — C679 Malignant neoplasm of bladder, unspecified: Secondary | ICD-10-CM | POA: Diagnosis not present

## 2014-02-16 DIAGNOSIS — I6789 Other cerebrovascular disease: Secondary | ICD-10-CM | POA: Diagnosis not present

## 2014-03-30 DIAGNOSIS — K219 Gastro-esophageal reflux disease without esophagitis: Secondary | ICD-10-CM | POA: Diagnosis not present

## 2014-03-30 DIAGNOSIS — F70 Mild intellectual disabilities: Secondary | ICD-10-CM | POA: Diagnosis not present

## 2014-03-30 DIAGNOSIS — Q619 Cystic kidney disease, unspecified: Secondary | ICD-10-CM | POA: Diagnosis not present

## 2014-03-30 DIAGNOSIS — I6789 Other cerebrovascular disease: Secondary | ICD-10-CM | POA: Diagnosis not present

## 2014-03-30 DIAGNOSIS — I10 Essential (primary) hypertension: Secondary | ICD-10-CM | POA: Diagnosis not present

## 2014-10-12 DIAGNOSIS — R140 Abdominal distension (gaseous): Secondary | ICD-10-CM | POA: Diagnosis not present

## 2014-10-12 DIAGNOSIS — I10 Essential (primary) hypertension: Secondary | ICD-10-CM | POA: Diagnosis not present

## 2014-10-12 DIAGNOSIS — R197 Diarrhea, unspecified: Secondary | ICD-10-CM | POA: Diagnosis not present

## 2014-10-12 DIAGNOSIS — I639 Cerebral infarction, unspecified: Secondary | ICD-10-CM | POA: Diagnosis not present

## 2014-10-12 DIAGNOSIS — F70 Mild intellectual disabilities: Secondary | ICD-10-CM | POA: Diagnosis not present

## 2014-10-12 DIAGNOSIS — J309 Allergic rhinitis, unspecified: Secondary | ICD-10-CM | POA: Diagnosis not present

## 2014-10-12 DIAGNOSIS — K219 Gastro-esophageal reflux disease without esophagitis: Secondary | ICD-10-CM | POA: Diagnosis not present

## 2014-10-16 DIAGNOSIS — R05 Cough: Secondary | ICD-10-CM | POA: Diagnosis not present

## 2014-10-16 DIAGNOSIS — M6281 Muscle weakness (generalized): Secondary | ICD-10-CM | POA: Diagnosis not present

## 2014-10-16 DIAGNOSIS — E785 Hyperlipidemia, unspecified: Secondary | ICD-10-CM | POA: Diagnosis not present

## 2014-10-16 DIAGNOSIS — R1310 Dysphagia, unspecified: Secondary | ICD-10-CM | POA: Diagnosis not present

## 2014-10-16 DIAGNOSIS — Z79899 Other long term (current) drug therapy: Secondary | ICD-10-CM | POA: Diagnosis not present

## 2014-10-16 DIAGNOSIS — R278 Other lack of coordination: Secondary | ICD-10-CM | POA: Diagnosis not present

## 2014-10-16 DIAGNOSIS — R269 Unspecified abnormalities of gait and mobility: Secondary | ICD-10-CM | POA: Diagnosis not present

## 2014-10-16 DIAGNOSIS — S06360A Traumatic hemorrhage of cerebrum, unspecified, without loss of consciousness, initial encounter: Secondary | ICD-10-CM | POA: Diagnosis not present

## 2014-10-17 DIAGNOSIS — R269 Unspecified abnormalities of gait and mobility: Secondary | ICD-10-CM | POA: Diagnosis not present

## 2014-10-17 DIAGNOSIS — S06360A Traumatic hemorrhage of cerebrum, unspecified, without loss of consciousness, initial encounter: Secondary | ICD-10-CM | POA: Diagnosis not present

## 2014-10-17 DIAGNOSIS — M6281 Muscle weakness (generalized): Secondary | ICD-10-CM | POA: Diagnosis not present

## 2014-10-17 DIAGNOSIS — R278 Other lack of coordination: Secondary | ICD-10-CM | POA: Diagnosis not present

## 2014-10-17 DIAGNOSIS — R1310 Dysphagia, unspecified: Secondary | ICD-10-CM | POA: Diagnosis not present

## 2014-10-18 DIAGNOSIS — S06360A Traumatic hemorrhage of cerebrum, unspecified, without loss of consciousness, initial encounter: Secondary | ICD-10-CM | POA: Diagnosis not present

## 2014-10-18 DIAGNOSIS — R278 Other lack of coordination: Secondary | ICD-10-CM | POA: Diagnosis not present

## 2014-10-18 DIAGNOSIS — M6281 Muscle weakness (generalized): Secondary | ICD-10-CM | POA: Diagnosis not present

## 2014-10-18 DIAGNOSIS — R1310 Dysphagia, unspecified: Secondary | ICD-10-CM | POA: Diagnosis not present

## 2014-10-18 DIAGNOSIS — R269 Unspecified abnormalities of gait and mobility: Secondary | ICD-10-CM | POA: Diagnosis not present

## 2014-10-19 DIAGNOSIS — M6281 Muscle weakness (generalized): Secondary | ICD-10-CM | POA: Diagnosis not present

## 2014-10-19 DIAGNOSIS — R278 Other lack of coordination: Secondary | ICD-10-CM | POA: Diagnosis not present

## 2014-10-19 DIAGNOSIS — S06360A Traumatic hemorrhage of cerebrum, unspecified, without loss of consciousness, initial encounter: Secondary | ICD-10-CM | POA: Diagnosis not present

## 2014-10-19 DIAGNOSIS — R1310 Dysphagia, unspecified: Secondary | ICD-10-CM | POA: Diagnosis not present

## 2014-10-19 DIAGNOSIS — R269 Unspecified abnormalities of gait and mobility: Secondary | ICD-10-CM | POA: Diagnosis not present

## 2014-10-20 DIAGNOSIS — S06360A Traumatic hemorrhage of cerebrum, unspecified, without loss of consciousness, initial encounter: Secondary | ICD-10-CM | POA: Diagnosis not present

## 2014-10-20 DIAGNOSIS — R1310 Dysphagia, unspecified: Secondary | ICD-10-CM | POA: Diagnosis not present

## 2014-10-20 DIAGNOSIS — M6281 Muscle weakness (generalized): Secondary | ICD-10-CM | POA: Diagnosis not present

## 2014-10-20 DIAGNOSIS — R278 Other lack of coordination: Secondary | ICD-10-CM | POA: Diagnosis not present

## 2014-10-20 DIAGNOSIS — R269 Unspecified abnormalities of gait and mobility: Secondary | ICD-10-CM | POA: Diagnosis not present

## 2014-10-23 DIAGNOSIS — R269 Unspecified abnormalities of gait and mobility: Secondary | ICD-10-CM | POA: Diagnosis not present

## 2014-10-23 DIAGNOSIS — R1310 Dysphagia, unspecified: Secondary | ICD-10-CM | POA: Diagnosis not present

## 2014-10-23 DIAGNOSIS — M6281 Muscle weakness (generalized): Secondary | ICD-10-CM | POA: Diagnosis not present

## 2014-10-23 DIAGNOSIS — S06360A Traumatic hemorrhage of cerebrum, unspecified, without loss of consciousness, initial encounter: Secondary | ICD-10-CM | POA: Diagnosis not present

## 2014-10-23 DIAGNOSIS — R278 Other lack of coordination: Secondary | ICD-10-CM | POA: Diagnosis not present

## 2014-10-24 DIAGNOSIS — S06360A Traumatic hemorrhage of cerebrum, unspecified, without loss of consciousness, initial encounter: Secondary | ICD-10-CM | POA: Diagnosis not present

## 2014-10-24 DIAGNOSIS — R1310 Dysphagia, unspecified: Secondary | ICD-10-CM | POA: Diagnosis not present

## 2014-10-24 DIAGNOSIS — R278 Other lack of coordination: Secondary | ICD-10-CM | POA: Diagnosis not present

## 2014-10-24 DIAGNOSIS — R269 Unspecified abnormalities of gait and mobility: Secondary | ICD-10-CM | POA: Diagnosis not present

## 2014-10-24 DIAGNOSIS — M6281 Muscle weakness (generalized): Secondary | ICD-10-CM | POA: Diagnosis not present

## 2014-10-25 DIAGNOSIS — M6281 Muscle weakness (generalized): Secondary | ICD-10-CM | POA: Diagnosis not present

## 2014-10-25 DIAGNOSIS — S06360A Traumatic hemorrhage of cerebrum, unspecified, without loss of consciousness, initial encounter: Secondary | ICD-10-CM | POA: Diagnosis not present

## 2014-10-25 DIAGNOSIS — R278 Other lack of coordination: Secondary | ICD-10-CM | POA: Diagnosis not present

## 2014-10-25 DIAGNOSIS — R269 Unspecified abnormalities of gait and mobility: Secondary | ICD-10-CM | POA: Diagnosis not present

## 2014-10-25 DIAGNOSIS — R1310 Dysphagia, unspecified: Secondary | ICD-10-CM | POA: Diagnosis not present

## 2014-10-26 DIAGNOSIS — M6281 Muscle weakness (generalized): Secondary | ICD-10-CM | POA: Diagnosis not present

## 2014-10-26 DIAGNOSIS — R350 Frequency of micturition: Secondary | ICD-10-CM | POA: Diagnosis not present

## 2014-10-26 DIAGNOSIS — S06360A Traumatic hemorrhage of cerebrum, unspecified, without loss of consciousness, initial encounter: Secondary | ICD-10-CM | POA: Diagnosis not present

## 2014-10-26 DIAGNOSIS — R339 Retention of urine, unspecified: Secondary | ICD-10-CM | POA: Diagnosis not present

## 2014-10-26 DIAGNOSIS — R269 Unspecified abnormalities of gait and mobility: Secondary | ICD-10-CM | POA: Diagnosis not present

## 2014-10-26 DIAGNOSIS — R1310 Dysphagia, unspecified: Secondary | ICD-10-CM | POA: Diagnosis not present

## 2014-10-26 DIAGNOSIS — R278 Other lack of coordination: Secondary | ICD-10-CM | POA: Diagnosis not present

## 2014-10-26 DIAGNOSIS — S60352A Superficial foreign body of left thumb, initial encounter: Secondary | ICD-10-CM | POA: Diagnosis not present

## 2014-10-26 DIAGNOSIS — K529 Noninfective gastroenteritis and colitis, unspecified: Secondary | ICD-10-CM | POA: Diagnosis not present

## 2014-10-26 DIAGNOSIS — R197 Diarrhea, unspecified: Secondary | ICD-10-CM | POA: Diagnosis not present

## 2014-10-27 DIAGNOSIS — R269 Unspecified abnormalities of gait and mobility: Secondary | ICD-10-CM | POA: Diagnosis not present

## 2014-10-27 DIAGNOSIS — R1310 Dysphagia, unspecified: Secondary | ICD-10-CM | POA: Diagnosis not present

## 2014-10-27 DIAGNOSIS — R278 Other lack of coordination: Secondary | ICD-10-CM | POA: Diagnosis not present

## 2014-10-27 DIAGNOSIS — S06360A Traumatic hemorrhage of cerebrum, unspecified, without loss of consciousness, initial encounter: Secondary | ICD-10-CM | POA: Diagnosis not present

## 2014-10-27 DIAGNOSIS — M6281 Muscle weakness (generalized): Secondary | ICD-10-CM | POA: Diagnosis not present

## 2014-10-30 DIAGNOSIS — R269 Unspecified abnormalities of gait and mobility: Secondary | ICD-10-CM | POA: Diagnosis not present

## 2014-10-30 DIAGNOSIS — R1310 Dysphagia, unspecified: Secondary | ICD-10-CM | POA: Diagnosis not present

## 2014-10-30 DIAGNOSIS — S06360A Traumatic hemorrhage of cerebrum, unspecified, without loss of consciousness, initial encounter: Secondary | ICD-10-CM | POA: Diagnosis not present

## 2014-10-30 DIAGNOSIS — M6281 Muscle weakness (generalized): Secondary | ICD-10-CM | POA: Diagnosis not present

## 2014-10-30 DIAGNOSIS — R278 Other lack of coordination: Secondary | ICD-10-CM | POA: Diagnosis not present

## 2014-10-31 DIAGNOSIS — R1310 Dysphagia, unspecified: Secondary | ICD-10-CM | POA: Diagnosis not present

## 2014-10-31 DIAGNOSIS — M6281 Muscle weakness (generalized): Secondary | ICD-10-CM | POA: Diagnosis not present

## 2014-10-31 DIAGNOSIS — R278 Other lack of coordination: Secondary | ICD-10-CM | POA: Diagnosis not present

## 2014-10-31 DIAGNOSIS — R269 Unspecified abnormalities of gait and mobility: Secondary | ICD-10-CM | POA: Diagnosis not present

## 2014-10-31 DIAGNOSIS — S06360A Traumatic hemorrhage of cerebrum, unspecified, without loss of consciousness, initial encounter: Secondary | ICD-10-CM | POA: Diagnosis not present

## 2014-11-15 DIAGNOSIS — Z79899 Other long term (current) drug therapy: Secondary | ICD-10-CM | POA: Diagnosis not present

## 2014-11-15 DIAGNOSIS — D649 Anemia, unspecified: Secondary | ICD-10-CM | POA: Diagnosis not present

## 2014-11-16 DIAGNOSIS — K219 Gastro-esophageal reflux disease without esophagitis: Secondary | ICD-10-CM | POA: Diagnosis not present

## 2014-11-16 DIAGNOSIS — D1721 Benign lipomatous neoplasm of skin and subcutaneous tissue of right arm: Secondary | ICD-10-CM | POA: Diagnosis not present

## 2014-11-16 DIAGNOSIS — I639 Cerebral infarction, unspecified: Secondary | ICD-10-CM | POA: Diagnosis not present

## 2014-11-16 DIAGNOSIS — I10 Essential (primary) hypertension: Secondary | ICD-10-CM | POA: Diagnosis not present

## 2014-11-16 DIAGNOSIS — F70 Mild intellectual disabilities: Secondary | ICD-10-CM | POA: Diagnosis not present

## 2014-11-16 DIAGNOSIS — C679 Malignant neoplasm of bladder, unspecified: Secondary | ICD-10-CM | POA: Diagnosis not present

## 2015-02-08 DIAGNOSIS — K219 Gastro-esophageal reflux disease without esophagitis: Secondary | ICD-10-CM | POA: Diagnosis not present

## 2015-02-08 DIAGNOSIS — F79 Unspecified intellectual disabilities: Secondary | ICD-10-CM | POA: Diagnosis not present

## 2015-02-08 DIAGNOSIS — Q619 Cystic kidney disease, unspecified: Secondary | ICD-10-CM | POA: Diagnosis not present

## 2015-02-08 DIAGNOSIS — D303 Benign neoplasm of bladder: Secondary | ICD-10-CM | POA: Diagnosis not present

## 2015-02-08 DIAGNOSIS — M79675 Pain in left toe(s): Secondary | ICD-10-CM | POA: Diagnosis not present

## 2015-02-08 DIAGNOSIS — I10 Essential (primary) hypertension: Secondary | ICD-10-CM | POA: Diagnosis not present

## 2015-02-08 DIAGNOSIS — D1721 Benign lipomatous neoplasm of skin and subcutaneous tissue of right arm: Secondary | ICD-10-CM | POA: Diagnosis not present

## 2015-02-08 DIAGNOSIS — B351 Tinea unguium: Secondary | ICD-10-CM | POA: Diagnosis not present

## 2015-02-08 DIAGNOSIS — I639 Cerebral infarction, unspecified: Secondary | ICD-10-CM | POA: Diagnosis not present

## 2015-04-02 DIAGNOSIS — M6281 Muscle weakness (generalized): Secondary | ICD-10-CM | POA: Diagnosis not present

## 2015-04-02 DIAGNOSIS — S06360A Traumatic hemorrhage of cerebrum, unspecified, without loss of consciousness, initial encounter: Secondary | ICD-10-CM | POA: Diagnosis not present

## 2015-04-12 DIAGNOSIS — Q619 Cystic kidney disease, unspecified: Secondary | ICD-10-CM | POA: Diagnosis not present

## 2015-04-12 DIAGNOSIS — I639 Cerebral infarction, unspecified: Secondary | ICD-10-CM | POA: Diagnosis not present

## 2015-04-12 DIAGNOSIS — K219 Gastro-esophageal reflux disease without esophagitis: Secondary | ICD-10-CM | POA: Diagnosis not present

## 2015-04-12 DIAGNOSIS — I10 Essential (primary) hypertension: Secondary | ICD-10-CM | POA: Diagnosis not present

## 2015-04-12 DIAGNOSIS — F70 Mild intellectual disabilities: Secondary | ICD-10-CM | POA: Diagnosis not present

## 2015-04-12 DIAGNOSIS — F0391 Unspecified dementia with behavioral disturbance: Secondary | ICD-10-CM | POA: Diagnosis not present

## 2015-05-16 DIAGNOSIS — Z79899 Other long term (current) drug therapy: Secondary | ICD-10-CM | POA: Diagnosis not present

## 2015-05-16 DIAGNOSIS — D649 Anemia, unspecified: Secondary | ICD-10-CM | POA: Diagnosis not present

## 2015-05-24 DIAGNOSIS — C679 Malignant neoplasm of bladder, unspecified: Secondary | ICD-10-CM | POA: Diagnosis not present

## 2015-05-24 DIAGNOSIS — F79 Unspecified intellectual disabilities: Secondary | ICD-10-CM | POA: Diagnosis not present

## 2015-05-24 DIAGNOSIS — I639 Cerebral infarction, unspecified: Secondary | ICD-10-CM | POA: Diagnosis not present

## 2015-05-24 DIAGNOSIS — K219 Gastro-esophageal reflux disease without esophagitis: Secondary | ICD-10-CM | POA: Diagnosis not present

## 2015-05-24 DIAGNOSIS — F0390 Unspecified dementia without behavioral disturbance: Secondary | ICD-10-CM | POA: Diagnosis not present

## 2015-05-24 DIAGNOSIS — I10 Essential (primary) hypertension: Secondary | ICD-10-CM | POA: Diagnosis not present

## 2015-06-25 DIAGNOSIS — H029 Unspecified disorder of eyelid: Secondary | ICD-10-CM | POA: Diagnosis not present

## 2015-06-25 DIAGNOSIS — H25813 Combined forms of age-related cataract, bilateral: Secondary | ICD-10-CM | POA: Diagnosis not present

## 2015-07-26 DIAGNOSIS — F79 Unspecified intellectual disabilities: Secondary | ICD-10-CM | POA: Diagnosis not present

## 2015-07-26 DIAGNOSIS — N200 Calculus of kidney: Secondary | ICD-10-CM | POA: Diagnosis not present

## 2015-07-26 DIAGNOSIS — E785 Hyperlipidemia, unspecified: Secondary | ICD-10-CM | POA: Diagnosis not present

## 2015-07-26 DIAGNOSIS — C679 Malignant neoplasm of bladder, unspecified: Secondary | ICD-10-CM | POA: Diagnosis not present

## 2015-07-26 DIAGNOSIS — I639 Cerebral infarction, unspecified: Secondary | ICD-10-CM | POA: Diagnosis not present

## 2015-07-26 DIAGNOSIS — K270 Acute peptic ulcer, site unspecified, with hemorrhage: Secondary | ICD-10-CM | POA: Diagnosis not present

## 2015-07-26 DIAGNOSIS — I10 Essential (primary) hypertension: Secondary | ICD-10-CM | POA: Diagnosis not present

## 2015-07-26 DIAGNOSIS — K219 Gastro-esophageal reflux disease without esophagitis: Secondary | ICD-10-CM | POA: Diagnosis not present

## 2015-08-30 DIAGNOSIS — Q619 Cystic kidney disease, unspecified: Secondary | ICD-10-CM | POA: Diagnosis not present

## 2015-08-30 DIAGNOSIS — D303 Benign neoplasm of bladder: Secondary | ICD-10-CM | POA: Diagnosis not present

## 2015-08-30 DIAGNOSIS — I639 Cerebral infarction, unspecified: Secondary | ICD-10-CM | POA: Diagnosis not present

## 2015-08-30 DIAGNOSIS — I10 Essential (primary) hypertension: Secondary | ICD-10-CM | POA: Diagnosis not present

## 2015-08-30 DIAGNOSIS — D1721 Benign lipomatous neoplasm of skin and subcutaneous tissue of right arm: Secondary | ICD-10-CM | POA: Diagnosis not present

## 2015-08-30 DIAGNOSIS — K219 Gastro-esophageal reflux disease without esophagitis: Secondary | ICD-10-CM | POA: Diagnosis not present

## 2015-08-30 DIAGNOSIS — F79 Unspecified intellectual disabilities: Secondary | ICD-10-CM | POA: Diagnosis not present

## 2015-10-04 DIAGNOSIS — F79 Unspecified intellectual disabilities: Secondary | ICD-10-CM | POA: Diagnosis not present

## 2015-10-04 DIAGNOSIS — K219 Gastro-esophageal reflux disease without esophagitis: Secondary | ICD-10-CM | POA: Diagnosis not present

## 2015-10-04 DIAGNOSIS — Q619 Cystic kidney disease, unspecified: Secondary | ICD-10-CM | POA: Diagnosis not present

## 2015-10-04 DIAGNOSIS — I639 Cerebral infarction, unspecified: Secondary | ICD-10-CM | POA: Diagnosis not present

## 2015-10-25 DIAGNOSIS — I639 Cerebral infarction, unspecified: Secondary | ICD-10-CM | POA: Diagnosis not present

## 2015-10-25 DIAGNOSIS — F70 Mild intellectual disabilities: Secondary | ICD-10-CM | POA: Diagnosis not present

## 2015-10-25 DIAGNOSIS — D494 Neoplasm of unspecified behavior of bladder: Secondary | ICD-10-CM | POA: Diagnosis not present

## 2015-10-25 DIAGNOSIS — I10 Essential (primary) hypertension: Secondary | ICD-10-CM | POA: Diagnosis not present

## 2015-10-25 DIAGNOSIS — F0390 Unspecified dementia without behavioral disturbance: Secondary | ICD-10-CM | POA: Diagnosis not present

## 2015-10-25 DIAGNOSIS — K219 Gastro-esophageal reflux disease without esophagitis: Secondary | ICD-10-CM | POA: Diagnosis not present

## 2015-10-31 DIAGNOSIS — Z125 Encounter for screening for malignant neoplasm of prostate: Secondary | ICD-10-CM | POA: Diagnosis not present

## 2015-10-31 DIAGNOSIS — R351 Nocturia: Secondary | ICD-10-CM | POA: Diagnosis not present

## 2015-10-31 DIAGNOSIS — R350 Frequency of micturition: Secondary | ICD-10-CM | POA: Diagnosis not present

## 2015-11-09 DIAGNOSIS — R41841 Cognitive communication deficit: Secondary | ICD-10-CM | POA: Diagnosis not present

## 2015-11-09 DIAGNOSIS — S06360A Traumatic hemorrhage of cerebrum, unspecified, without loss of consciousness, initial encounter: Secondary | ICD-10-CM | POA: Diagnosis not present

## 2015-11-09 DIAGNOSIS — I10 Essential (primary) hypertension: Secondary | ICD-10-CM | POA: Diagnosis not present

## 2015-11-09 DIAGNOSIS — I6990 Unspecified sequelae of unspecified cerebrovascular disease: Secondary | ICD-10-CM | POA: Diagnosis not present

## 2015-11-09 DIAGNOSIS — F70 Mild intellectual disabilities: Secondary | ICD-10-CM | POA: Diagnosis not present

## 2015-11-13 DIAGNOSIS — F70 Mild intellectual disabilities: Secondary | ICD-10-CM | POA: Diagnosis not present

## 2015-11-13 DIAGNOSIS — I10 Essential (primary) hypertension: Secondary | ICD-10-CM | POA: Diagnosis not present

## 2015-11-13 DIAGNOSIS — I6990 Unspecified sequelae of unspecified cerebrovascular disease: Secondary | ICD-10-CM | POA: Diagnosis not present

## 2015-11-13 DIAGNOSIS — S06360A Traumatic hemorrhage of cerebrum, unspecified, without loss of consciousness, initial encounter: Secondary | ICD-10-CM | POA: Diagnosis not present

## 2015-11-13 DIAGNOSIS — R41841 Cognitive communication deficit: Secondary | ICD-10-CM | POA: Diagnosis not present

## 2015-11-15 DIAGNOSIS — F70 Mild intellectual disabilities: Secondary | ICD-10-CM | POA: Diagnosis not present

## 2015-11-15 DIAGNOSIS — D649 Anemia, unspecified: Secondary | ICD-10-CM | POA: Diagnosis not present

## 2015-11-15 DIAGNOSIS — I6990 Unspecified sequelae of unspecified cerebrovascular disease: Secondary | ICD-10-CM | POA: Diagnosis not present

## 2015-11-15 DIAGNOSIS — S06360A Traumatic hemorrhage of cerebrum, unspecified, without loss of consciousness, initial encounter: Secondary | ICD-10-CM | POA: Diagnosis not present

## 2015-11-15 DIAGNOSIS — I10 Essential (primary) hypertension: Secondary | ICD-10-CM | POA: Diagnosis not present

## 2015-11-15 DIAGNOSIS — Z79899 Other long term (current) drug therapy: Secondary | ICD-10-CM | POA: Diagnosis not present

## 2015-11-15 DIAGNOSIS — R41841 Cognitive communication deficit: Secondary | ICD-10-CM | POA: Diagnosis not present

## 2015-11-16 DIAGNOSIS — F70 Mild intellectual disabilities: Secondary | ICD-10-CM | POA: Diagnosis not present

## 2015-11-16 DIAGNOSIS — R41841 Cognitive communication deficit: Secondary | ICD-10-CM | POA: Diagnosis not present

## 2015-11-16 DIAGNOSIS — S06360A Traumatic hemorrhage of cerebrum, unspecified, without loss of consciousness, initial encounter: Secondary | ICD-10-CM | POA: Diagnosis not present

## 2015-11-16 DIAGNOSIS — I6990 Unspecified sequelae of unspecified cerebrovascular disease: Secondary | ICD-10-CM | POA: Diagnosis not present

## 2015-11-16 DIAGNOSIS — I10 Essential (primary) hypertension: Secondary | ICD-10-CM | POA: Diagnosis not present

## 2015-11-19 DIAGNOSIS — R41841 Cognitive communication deficit: Secondary | ICD-10-CM | POA: Diagnosis not present

## 2015-11-19 DIAGNOSIS — I10 Essential (primary) hypertension: Secondary | ICD-10-CM | POA: Diagnosis not present

## 2015-11-19 DIAGNOSIS — I6990 Unspecified sequelae of unspecified cerebrovascular disease: Secondary | ICD-10-CM | POA: Diagnosis not present

## 2015-11-19 DIAGNOSIS — S06360A Traumatic hemorrhage of cerebrum, unspecified, without loss of consciousness, initial encounter: Secondary | ICD-10-CM | POA: Diagnosis not present

## 2015-11-19 DIAGNOSIS — F70 Mild intellectual disabilities: Secondary | ICD-10-CM | POA: Diagnosis not present

## 2015-11-21 DIAGNOSIS — R41841 Cognitive communication deficit: Secondary | ICD-10-CM | POA: Diagnosis not present

## 2015-11-21 DIAGNOSIS — I10 Essential (primary) hypertension: Secondary | ICD-10-CM | POA: Diagnosis not present

## 2015-11-21 DIAGNOSIS — F70 Mild intellectual disabilities: Secondary | ICD-10-CM | POA: Diagnosis not present

## 2015-11-21 DIAGNOSIS — I6990 Unspecified sequelae of unspecified cerebrovascular disease: Secondary | ICD-10-CM | POA: Diagnosis not present

## 2015-11-21 DIAGNOSIS — S06360A Traumatic hemorrhage of cerebrum, unspecified, without loss of consciousness, initial encounter: Secondary | ICD-10-CM | POA: Diagnosis not present

## 2015-11-23 DIAGNOSIS — R41841 Cognitive communication deficit: Secondary | ICD-10-CM | POA: Diagnosis not present

## 2015-11-23 DIAGNOSIS — I6990 Unspecified sequelae of unspecified cerebrovascular disease: Secondary | ICD-10-CM | POA: Diagnosis not present

## 2015-11-23 DIAGNOSIS — F70 Mild intellectual disabilities: Secondary | ICD-10-CM | POA: Diagnosis not present

## 2015-11-23 DIAGNOSIS — I10 Essential (primary) hypertension: Secondary | ICD-10-CM | POA: Diagnosis not present

## 2015-11-23 DIAGNOSIS — S06360A Traumatic hemorrhage of cerebrum, unspecified, without loss of consciousness, initial encounter: Secondary | ICD-10-CM | POA: Diagnosis not present

## 2015-11-26 DIAGNOSIS — I6990 Unspecified sequelae of unspecified cerebrovascular disease: Secondary | ICD-10-CM | POA: Diagnosis not present

## 2015-11-26 DIAGNOSIS — I10 Essential (primary) hypertension: Secondary | ICD-10-CM | POA: Diagnosis not present

## 2015-11-26 DIAGNOSIS — R41841 Cognitive communication deficit: Secondary | ICD-10-CM | POA: Diagnosis not present

## 2015-11-26 DIAGNOSIS — F70 Mild intellectual disabilities: Secondary | ICD-10-CM | POA: Diagnosis not present

## 2015-11-26 DIAGNOSIS — S06360A Traumatic hemorrhage of cerebrum, unspecified, without loss of consciousness, initial encounter: Secondary | ICD-10-CM | POA: Diagnosis not present

## 2015-11-28 DIAGNOSIS — I10 Essential (primary) hypertension: Secondary | ICD-10-CM | POA: Diagnosis not present

## 2015-11-28 DIAGNOSIS — I6990 Unspecified sequelae of unspecified cerebrovascular disease: Secondary | ICD-10-CM | POA: Diagnosis not present

## 2015-11-28 DIAGNOSIS — F70 Mild intellectual disabilities: Secondary | ICD-10-CM | POA: Diagnosis not present

## 2015-11-28 DIAGNOSIS — R41841 Cognitive communication deficit: Secondary | ICD-10-CM | POA: Diagnosis not present

## 2015-11-28 DIAGNOSIS — S06360A Traumatic hemorrhage of cerebrum, unspecified, without loss of consciousness, initial encounter: Secondary | ICD-10-CM | POA: Diagnosis not present

## 2015-11-29 DIAGNOSIS — I10 Essential (primary) hypertension: Secondary | ICD-10-CM | POA: Diagnosis not present

## 2015-11-29 DIAGNOSIS — Q619 Cystic kidney disease, unspecified: Secondary | ICD-10-CM | POA: Diagnosis not present

## 2015-11-29 DIAGNOSIS — I639 Cerebral infarction, unspecified: Secondary | ICD-10-CM | POA: Diagnosis not present

## 2015-11-29 DIAGNOSIS — D494 Neoplasm of unspecified behavior of bladder: Secondary | ICD-10-CM | POA: Diagnosis not present

## 2015-11-29 DIAGNOSIS — F79 Unspecified intellectual disabilities: Secondary | ICD-10-CM | POA: Diagnosis not present

## 2015-11-29 DIAGNOSIS — K219 Gastro-esophageal reflux disease without esophagitis: Secondary | ICD-10-CM | POA: Diagnosis not present

## 2015-11-29 DIAGNOSIS — D1721 Benign lipomatous neoplasm of skin and subcutaneous tissue of right arm: Secondary | ICD-10-CM | POA: Diagnosis not present

## 2015-11-30 DIAGNOSIS — I6990 Unspecified sequelae of unspecified cerebrovascular disease: Secondary | ICD-10-CM | POA: Diagnosis not present

## 2015-11-30 DIAGNOSIS — S06360A Traumatic hemorrhage of cerebrum, unspecified, without loss of consciousness, initial encounter: Secondary | ICD-10-CM | POA: Diagnosis not present

## 2015-11-30 DIAGNOSIS — R41841 Cognitive communication deficit: Secondary | ICD-10-CM | POA: Diagnosis not present

## 2015-11-30 DIAGNOSIS — I10 Essential (primary) hypertension: Secondary | ICD-10-CM | POA: Diagnosis not present

## 2015-11-30 DIAGNOSIS — F70 Mild intellectual disabilities: Secondary | ICD-10-CM | POA: Diagnosis not present

## 2015-12-03 DIAGNOSIS — I6990 Unspecified sequelae of unspecified cerebrovascular disease: Secondary | ICD-10-CM | POA: Diagnosis not present

## 2015-12-03 DIAGNOSIS — I10 Essential (primary) hypertension: Secondary | ICD-10-CM | POA: Diagnosis not present

## 2015-12-03 DIAGNOSIS — R41841 Cognitive communication deficit: Secondary | ICD-10-CM | POA: Diagnosis not present

## 2015-12-03 DIAGNOSIS — S06360A Traumatic hemorrhage of cerebrum, unspecified, without loss of consciousness, initial encounter: Secondary | ICD-10-CM | POA: Diagnosis not present

## 2015-12-03 DIAGNOSIS — F70 Mild intellectual disabilities: Secondary | ICD-10-CM | POA: Diagnosis not present

## 2015-12-05 DIAGNOSIS — I6990 Unspecified sequelae of unspecified cerebrovascular disease: Secondary | ICD-10-CM | POA: Diagnosis not present

## 2015-12-05 DIAGNOSIS — F70 Mild intellectual disabilities: Secondary | ICD-10-CM | POA: Diagnosis not present

## 2015-12-05 DIAGNOSIS — R41841 Cognitive communication deficit: Secondary | ICD-10-CM | POA: Diagnosis not present

## 2015-12-05 DIAGNOSIS — I10 Essential (primary) hypertension: Secondary | ICD-10-CM | POA: Diagnosis not present

## 2015-12-05 DIAGNOSIS — S06360A Traumatic hemorrhage of cerebrum, unspecified, without loss of consciousness, initial encounter: Secondary | ICD-10-CM | POA: Diagnosis not present

## 2015-12-07 DIAGNOSIS — I6990 Unspecified sequelae of unspecified cerebrovascular disease: Secondary | ICD-10-CM | POA: Diagnosis not present

## 2015-12-07 DIAGNOSIS — F70 Mild intellectual disabilities: Secondary | ICD-10-CM | POA: Diagnosis not present

## 2015-12-07 DIAGNOSIS — I10 Essential (primary) hypertension: Secondary | ICD-10-CM | POA: Diagnosis not present

## 2015-12-07 DIAGNOSIS — S06360A Traumatic hemorrhage of cerebrum, unspecified, without loss of consciousness, initial encounter: Secondary | ICD-10-CM | POA: Diagnosis not present

## 2015-12-07 DIAGNOSIS — R41841 Cognitive communication deficit: Secondary | ICD-10-CM | POA: Diagnosis not present

## 2015-12-11 DIAGNOSIS — R41841 Cognitive communication deficit: Secondary | ICD-10-CM | POA: Diagnosis not present

## 2015-12-11 DIAGNOSIS — F70 Mild intellectual disabilities: Secondary | ICD-10-CM | POA: Diagnosis not present

## 2015-12-11 DIAGNOSIS — I6990 Unspecified sequelae of unspecified cerebrovascular disease: Secondary | ICD-10-CM | POA: Diagnosis not present

## 2015-12-11 DIAGNOSIS — I10 Essential (primary) hypertension: Secondary | ICD-10-CM | POA: Diagnosis not present

## 2015-12-11 DIAGNOSIS — S06360A Traumatic hemorrhage of cerebrum, unspecified, without loss of consciousness, initial encounter: Secondary | ICD-10-CM | POA: Diagnosis not present

## 2015-12-12 DIAGNOSIS — I6990 Unspecified sequelae of unspecified cerebrovascular disease: Secondary | ICD-10-CM | POA: Diagnosis not present

## 2015-12-12 DIAGNOSIS — I10 Essential (primary) hypertension: Secondary | ICD-10-CM | POA: Diagnosis not present

## 2015-12-12 DIAGNOSIS — S06360A Traumatic hemorrhage of cerebrum, unspecified, without loss of consciousness, initial encounter: Secondary | ICD-10-CM | POA: Diagnosis not present

## 2015-12-12 DIAGNOSIS — R41841 Cognitive communication deficit: Secondary | ICD-10-CM | POA: Diagnosis not present

## 2015-12-12 DIAGNOSIS — F70 Mild intellectual disabilities: Secondary | ICD-10-CM | POA: Diagnosis not present

## 2015-12-14 DIAGNOSIS — F70 Mild intellectual disabilities: Secondary | ICD-10-CM | POA: Diagnosis not present

## 2015-12-14 DIAGNOSIS — I10 Essential (primary) hypertension: Secondary | ICD-10-CM | POA: Diagnosis not present

## 2015-12-14 DIAGNOSIS — S06360A Traumatic hemorrhage of cerebrum, unspecified, without loss of consciousness, initial encounter: Secondary | ICD-10-CM | POA: Diagnosis not present

## 2015-12-14 DIAGNOSIS — R41841 Cognitive communication deficit: Secondary | ICD-10-CM | POA: Diagnosis not present

## 2015-12-14 DIAGNOSIS — I6990 Unspecified sequelae of unspecified cerebrovascular disease: Secondary | ICD-10-CM | POA: Diagnosis not present

## 2015-12-17 DIAGNOSIS — I10 Essential (primary) hypertension: Secondary | ICD-10-CM | POA: Diagnosis not present

## 2015-12-17 DIAGNOSIS — S06360A Traumatic hemorrhage of cerebrum, unspecified, without loss of consciousness, initial encounter: Secondary | ICD-10-CM | POA: Diagnosis not present

## 2015-12-17 DIAGNOSIS — F70 Mild intellectual disabilities: Secondary | ICD-10-CM | POA: Diagnosis not present

## 2015-12-17 DIAGNOSIS — I6990 Unspecified sequelae of unspecified cerebrovascular disease: Secondary | ICD-10-CM | POA: Diagnosis not present

## 2015-12-17 DIAGNOSIS — R41841 Cognitive communication deficit: Secondary | ICD-10-CM | POA: Diagnosis not present

## 2015-12-18 DIAGNOSIS — S06360A Traumatic hemorrhage of cerebrum, unspecified, without loss of consciousness, initial encounter: Secondary | ICD-10-CM | POA: Diagnosis not present

## 2015-12-18 DIAGNOSIS — I10 Essential (primary) hypertension: Secondary | ICD-10-CM | POA: Diagnosis not present

## 2015-12-18 DIAGNOSIS — R41841 Cognitive communication deficit: Secondary | ICD-10-CM | POA: Diagnosis not present

## 2015-12-18 DIAGNOSIS — I6990 Unspecified sequelae of unspecified cerebrovascular disease: Secondary | ICD-10-CM | POA: Diagnosis not present

## 2015-12-18 DIAGNOSIS — F70 Mild intellectual disabilities: Secondary | ICD-10-CM | POA: Diagnosis not present

## 2015-12-20 DIAGNOSIS — F70 Mild intellectual disabilities: Secondary | ICD-10-CM | POA: Diagnosis not present

## 2015-12-20 DIAGNOSIS — R41841 Cognitive communication deficit: Secondary | ICD-10-CM | POA: Diagnosis not present

## 2015-12-20 DIAGNOSIS — I10 Essential (primary) hypertension: Secondary | ICD-10-CM | POA: Diagnosis not present

## 2015-12-20 DIAGNOSIS — I6990 Unspecified sequelae of unspecified cerebrovascular disease: Secondary | ICD-10-CM | POA: Diagnosis not present

## 2015-12-20 DIAGNOSIS — S06360A Traumatic hemorrhage of cerebrum, unspecified, without loss of consciousness, initial encounter: Secondary | ICD-10-CM | POA: Diagnosis not present

## 2015-12-24 DIAGNOSIS — R41841 Cognitive communication deficit: Secondary | ICD-10-CM | POA: Diagnosis not present

## 2015-12-24 DIAGNOSIS — F70 Mild intellectual disabilities: Secondary | ICD-10-CM | POA: Diagnosis not present

## 2015-12-24 DIAGNOSIS — S06360A Traumatic hemorrhage of cerebrum, unspecified, without loss of consciousness, initial encounter: Secondary | ICD-10-CM | POA: Diagnosis not present

## 2015-12-24 DIAGNOSIS — I6990 Unspecified sequelae of unspecified cerebrovascular disease: Secondary | ICD-10-CM | POA: Diagnosis not present

## 2015-12-24 DIAGNOSIS — I10 Essential (primary) hypertension: Secondary | ICD-10-CM | POA: Diagnosis not present

## 2015-12-27 DIAGNOSIS — F70 Mild intellectual disabilities: Secondary | ICD-10-CM | POA: Diagnosis not present

## 2015-12-27 DIAGNOSIS — I6990 Unspecified sequelae of unspecified cerebrovascular disease: Secondary | ICD-10-CM | POA: Diagnosis not present

## 2015-12-27 DIAGNOSIS — S06360A Traumatic hemorrhage of cerebrum, unspecified, without loss of consciousness, initial encounter: Secondary | ICD-10-CM | POA: Diagnosis not present

## 2015-12-27 DIAGNOSIS — R41841 Cognitive communication deficit: Secondary | ICD-10-CM | POA: Diagnosis not present

## 2015-12-27 DIAGNOSIS — I10 Essential (primary) hypertension: Secondary | ICD-10-CM | POA: Diagnosis not present

## 2015-12-28 DIAGNOSIS — F70 Mild intellectual disabilities: Secondary | ICD-10-CM | POA: Diagnosis not present

## 2015-12-28 DIAGNOSIS — I10 Essential (primary) hypertension: Secondary | ICD-10-CM | POA: Diagnosis not present

## 2015-12-28 DIAGNOSIS — R41841 Cognitive communication deficit: Secondary | ICD-10-CM | POA: Diagnosis not present

## 2015-12-28 DIAGNOSIS — I6990 Unspecified sequelae of unspecified cerebrovascular disease: Secondary | ICD-10-CM | POA: Diagnosis not present

## 2015-12-28 DIAGNOSIS — S06360A Traumatic hemorrhage of cerebrum, unspecified, without loss of consciousness, initial encounter: Secondary | ICD-10-CM | POA: Diagnosis not present

## 2015-12-31 DIAGNOSIS — I6990 Unspecified sequelae of unspecified cerebrovascular disease: Secondary | ICD-10-CM | POA: Diagnosis not present

## 2015-12-31 DIAGNOSIS — S06360A Traumatic hemorrhage of cerebrum, unspecified, without loss of consciousness, initial encounter: Secondary | ICD-10-CM | POA: Diagnosis not present

## 2015-12-31 DIAGNOSIS — F70 Mild intellectual disabilities: Secondary | ICD-10-CM | POA: Diagnosis not present

## 2015-12-31 DIAGNOSIS — I10 Essential (primary) hypertension: Secondary | ICD-10-CM | POA: Diagnosis not present

## 2015-12-31 DIAGNOSIS — R41841 Cognitive communication deficit: Secondary | ICD-10-CM | POA: Diagnosis not present

## 2016-01-02 DIAGNOSIS — R41841 Cognitive communication deficit: Secondary | ICD-10-CM | POA: Diagnosis not present

## 2016-01-02 DIAGNOSIS — I6990 Unspecified sequelae of unspecified cerebrovascular disease: Secondary | ICD-10-CM | POA: Diagnosis not present

## 2016-01-02 DIAGNOSIS — S06360A Traumatic hemorrhage of cerebrum, unspecified, without loss of consciousness, initial encounter: Secondary | ICD-10-CM | POA: Diagnosis not present

## 2016-01-02 DIAGNOSIS — I10 Essential (primary) hypertension: Secondary | ICD-10-CM | POA: Diagnosis not present

## 2016-01-02 DIAGNOSIS — F70 Mild intellectual disabilities: Secondary | ICD-10-CM | POA: Diagnosis not present

## 2016-01-03 DIAGNOSIS — Q619 Cystic kidney disease, unspecified: Secondary | ICD-10-CM | POA: Diagnosis not present

## 2016-01-03 DIAGNOSIS — I639 Cerebral infarction, unspecified: Secondary | ICD-10-CM | POA: Diagnosis not present

## 2016-01-03 DIAGNOSIS — I10 Essential (primary) hypertension: Secondary | ICD-10-CM | POA: Diagnosis not present

## 2016-01-03 DIAGNOSIS — K219 Gastro-esophageal reflux disease without esophagitis: Secondary | ICD-10-CM | POA: Diagnosis not present

## 2016-01-03 DIAGNOSIS — D303 Benign neoplasm of bladder: Secondary | ICD-10-CM | POA: Diagnosis not present

## 2016-01-03 DIAGNOSIS — F79 Unspecified intellectual disabilities: Secondary | ICD-10-CM | POA: Diagnosis not present

## 2016-01-04 DIAGNOSIS — S06360A Traumatic hemorrhage of cerebrum, unspecified, without loss of consciousness, initial encounter: Secondary | ICD-10-CM | POA: Diagnosis not present

## 2016-01-04 DIAGNOSIS — F70 Mild intellectual disabilities: Secondary | ICD-10-CM | POA: Diagnosis not present

## 2016-01-04 DIAGNOSIS — R41841 Cognitive communication deficit: Secondary | ICD-10-CM | POA: Diagnosis not present

## 2016-01-04 DIAGNOSIS — I10 Essential (primary) hypertension: Secondary | ICD-10-CM | POA: Diagnosis not present

## 2016-01-04 DIAGNOSIS — I6990 Unspecified sequelae of unspecified cerebrovascular disease: Secondary | ICD-10-CM | POA: Diagnosis not present

## 2016-01-07 DIAGNOSIS — I10 Essential (primary) hypertension: Secondary | ICD-10-CM | POA: Diagnosis not present

## 2016-01-07 DIAGNOSIS — I6990 Unspecified sequelae of unspecified cerebrovascular disease: Secondary | ICD-10-CM | POA: Diagnosis not present

## 2016-01-07 DIAGNOSIS — F70 Mild intellectual disabilities: Secondary | ICD-10-CM | POA: Diagnosis not present

## 2016-01-07 DIAGNOSIS — R41841 Cognitive communication deficit: Secondary | ICD-10-CM | POA: Diagnosis not present

## 2016-01-07 DIAGNOSIS — S06360A Traumatic hemorrhage of cerebrum, unspecified, without loss of consciousness, initial encounter: Secondary | ICD-10-CM | POA: Diagnosis not present

## 2016-01-09 DIAGNOSIS — F70 Mild intellectual disabilities: Secondary | ICD-10-CM | POA: Diagnosis not present

## 2016-01-09 DIAGNOSIS — I6990 Unspecified sequelae of unspecified cerebrovascular disease: Secondary | ICD-10-CM | POA: Diagnosis not present

## 2016-01-09 DIAGNOSIS — I10 Essential (primary) hypertension: Secondary | ICD-10-CM | POA: Diagnosis not present

## 2016-01-09 DIAGNOSIS — S06360A Traumatic hemorrhage of cerebrum, unspecified, without loss of consciousness, initial encounter: Secondary | ICD-10-CM | POA: Diagnosis not present

## 2016-01-09 DIAGNOSIS — R41841 Cognitive communication deficit: Secondary | ICD-10-CM | POA: Diagnosis not present

## 2016-01-11 DIAGNOSIS — F70 Mild intellectual disabilities: Secondary | ICD-10-CM | POA: Diagnosis not present

## 2016-01-11 DIAGNOSIS — I6990 Unspecified sequelae of unspecified cerebrovascular disease: Secondary | ICD-10-CM | POA: Diagnosis not present

## 2016-01-11 DIAGNOSIS — R41841 Cognitive communication deficit: Secondary | ICD-10-CM | POA: Diagnosis not present

## 2016-01-11 DIAGNOSIS — S06360A Traumatic hemorrhage of cerebrum, unspecified, without loss of consciousness, initial encounter: Secondary | ICD-10-CM | POA: Diagnosis not present

## 2016-01-11 DIAGNOSIS — I10 Essential (primary) hypertension: Secondary | ICD-10-CM | POA: Diagnosis not present

## 2016-01-15 DIAGNOSIS — I10 Essential (primary) hypertension: Secondary | ICD-10-CM | POA: Diagnosis not present

## 2016-01-15 DIAGNOSIS — R41841 Cognitive communication deficit: Secondary | ICD-10-CM | POA: Diagnosis not present

## 2016-01-15 DIAGNOSIS — I6990 Unspecified sequelae of unspecified cerebrovascular disease: Secondary | ICD-10-CM | POA: Diagnosis not present

## 2016-01-15 DIAGNOSIS — S06360A Traumatic hemorrhage of cerebrum, unspecified, without loss of consciousness, initial encounter: Secondary | ICD-10-CM | POA: Diagnosis not present

## 2016-01-15 DIAGNOSIS — F70 Mild intellectual disabilities: Secondary | ICD-10-CM | POA: Diagnosis not present

## 2016-01-16 DIAGNOSIS — I6990 Unspecified sequelae of unspecified cerebrovascular disease: Secondary | ICD-10-CM | POA: Diagnosis not present

## 2016-01-16 DIAGNOSIS — R41841 Cognitive communication deficit: Secondary | ICD-10-CM | POA: Diagnosis not present

## 2016-01-16 DIAGNOSIS — I10 Essential (primary) hypertension: Secondary | ICD-10-CM | POA: Diagnosis not present

## 2016-01-16 DIAGNOSIS — F70 Mild intellectual disabilities: Secondary | ICD-10-CM | POA: Diagnosis not present

## 2016-01-16 DIAGNOSIS — S06360A Traumatic hemorrhage of cerebrum, unspecified, without loss of consciousness, initial encounter: Secondary | ICD-10-CM | POA: Diagnosis not present

## 2016-01-17 DIAGNOSIS — R41841 Cognitive communication deficit: Secondary | ICD-10-CM | POA: Diagnosis not present

## 2016-01-17 DIAGNOSIS — S06360A Traumatic hemorrhage of cerebrum, unspecified, without loss of consciousness, initial encounter: Secondary | ICD-10-CM | POA: Diagnosis not present

## 2016-01-17 DIAGNOSIS — I10 Essential (primary) hypertension: Secondary | ICD-10-CM | POA: Diagnosis not present

## 2016-01-17 DIAGNOSIS — I6990 Unspecified sequelae of unspecified cerebrovascular disease: Secondary | ICD-10-CM | POA: Diagnosis not present

## 2016-01-17 DIAGNOSIS — F70 Mild intellectual disabilities: Secondary | ICD-10-CM | POA: Diagnosis not present

## 2016-01-21 DIAGNOSIS — F70 Mild intellectual disabilities: Secondary | ICD-10-CM | POA: Diagnosis not present

## 2016-01-21 DIAGNOSIS — I10 Essential (primary) hypertension: Secondary | ICD-10-CM | POA: Diagnosis not present

## 2016-01-21 DIAGNOSIS — R41841 Cognitive communication deficit: Secondary | ICD-10-CM | POA: Diagnosis not present

## 2016-01-21 DIAGNOSIS — I6990 Unspecified sequelae of unspecified cerebrovascular disease: Secondary | ICD-10-CM | POA: Diagnosis not present

## 2016-01-21 DIAGNOSIS — S06360A Traumatic hemorrhage of cerebrum, unspecified, without loss of consciousness, initial encounter: Secondary | ICD-10-CM | POA: Diagnosis not present

## 2016-01-23 DIAGNOSIS — F70 Mild intellectual disabilities: Secondary | ICD-10-CM | POA: Diagnosis not present

## 2016-01-23 DIAGNOSIS — S06360A Traumatic hemorrhage of cerebrum, unspecified, without loss of consciousness, initial encounter: Secondary | ICD-10-CM | POA: Diagnosis not present

## 2016-01-23 DIAGNOSIS — I6990 Unspecified sequelae of unspecified cerebrovascular disease: Secondary | ICD-10-CM | POA: Diagnosis not present

## 2016-01-23 DIAGNOSIS — I10 Essential (primary) hypertension: Secondary | ICD-10-CM | POA: Diagnosis not present

## 2016-01-23 DIAGNOSIS — R41841 Cognitive communication deficit: Secondary | ICD-10-CM | POA: Diagnosis not present

## 2016-01-25 DIAGNOSIS — S06360A Traumatic hemorrhage of cerebrum, unspecified, without loss of consciousness, initial encounter: Secondary | ICD-10-CM | POA: Diagnosis not present

## 2016-01-25 DIAGNOSIS — I6990 Unspecified sequelae of unspecified cerebrovascular disease: Secondary | ICD-10-CM | POA: Diagnosis not present

## 2016-01-25 DIAGNOSIS — I10 Essential (primary) hypertension: Secondary | ICD-10-CM | POA: Diagnosis not present

## 2016-01-25 DIAGNOSIS — R41841 Cognitive communication deficit: Secondary | ICD-10-CM | POA: Diagnosis not present

## 2016-01-25 DIAGNOSIS — F70 Mild intellectual disabilities: Secondary | ICD-10-CM | POA: Diagnosis not present

## 2016-01-28 DIAGNOSIS — I10 Essential (primary) hypertension: Secondary | ICD-10-CM | POA: Diagnosis not present

## 2016-01-28 DIAGNOSIS — R41841 Cognitive communication deficit: Secondary | ICD-10-CM | POA: Diagnosis not present

## 2016-01-28 DIAGNOSIS — S06360A Traumatic hemorrhage of cerebrum, unspecified, without loss of consciousness, initial encounter: Secondary | ICD-10-CM | POA: Diagnosis not present

## 2016-01-28 DIAGNOSIS — F70 Mild intellectual disabilities: Secondary | ICD-10-CM | POA: Diagnosis not present

## 2016-01-28 DIAGNOSIS — I6990 Unspecified sequelae of unspecified cerebrovascular disease: Secondary | ICD-10-CM | POA: Diagnosis not present

## 2016-01-30 DIAGNOSIS — S06360A Traumatic hemorrhage of cerebrum, unspecified, without loss of consciousness, initial encounter: Secondary | ICD-10-CM | POA: Diagnosis not present

## 2016-01-30 DIAGNOSIS — I6990 Unspecified sequelae of unspecified cerebrovascular disease: Secondary | ICD-10-CM | POA: Diagnosis not present

## 2016-01-30 DIAGNOSIS — F70 Mild intellectual disabilities: Secondary | ICD-10-CM | POA: Diagnosis not present

## 2016-01-30 DIAGNOSIS — I10 Essential (primary) hypertension: Secondary | ICD-10-CM | POA: Diagnosis not present

## 2016-01-30 DIAGNOSIS — R41841 Cognitive communication deficit: Secondary | ICD-10-CM | POA: Diagnosis not present

## 2016-02-01 DIAGNOSIS — S06360A Traumatic hemorrhage of cerebrum, unspecified, without loss of consciousness, initial encounter: Secondary | ICD-10-CM | POA: Diagnosis not present

## 2016-02-01 DIAGNOSIS — I10 Essential (primary) hypertension: Secondary | ICD-10-CM | POA: Diagnosis not present

## 2016-02-01 DIAGNOSIS — R41841 Cognitive communication deficit: Secondary | ICD-10-CM | POA: Diagnosis not present

## 2016-02-01 DIAGNOSIS — F70 Mild intellectual disabilities: Secondary | ICD-10-CM | POA: Diagnosis not present

## 2016-02-01 DIAGNOSIS — I6990 Unspecified sequelae of unspecified cerebrovascular disease: Secondary | ICD-10-CM | POA: Diagnosis not present

## 2016-02-04 DIAGNOSIS — F70 Mild intellectual disabilities: Secondary | ICD-10-CM | POA: Diagnosis not present

## 2016-02-04 DIAGNOSIS — S06360A Traumatic hemorrhage of cerebrum, unspecified, without loss of consciousness, initial encounter: Secondary | ICD-10-CM | POA: Diagnosis not present

## 2016-02-04 DIAGNOSIS — I6990 Unspecified sequelae of unspecified cerebrovascular disease: Secondary | ICD-10-CM | POA: Diagnosis not present

## 2016-02-04 DIAGNOSIS — I10 Essential (primary) hypertension: Secondary | ICD-10-CM | POA: Diagnosis not present

## 2016-02-04 DIAGNOSIS — R41841 Cognitive communication deficit: Secondary | ICD-10-CM | POA: Diagnosis not present

## 2016-02-07 DIAGNOSIS — I639 Cerebral infarction, unspecified: Secondary | ICD-10-CM | POA: Diagnosis not present

## 2016-02-07 DIAGNOSIS — F79 Unspecified intellectual disabilities: Secondary | ICD-10-CM | POA: Diagnosis not present

## 2016-02-07 DIAGNOSIS — D179 Benign lipomatous neoplasm, unspecified: Secondary | ICD-10-CM | POA: Diagnosis not present

## 2016-02-07 DIAGNOSIS — C679 Malignant neoplasm of bladder, unspecified: Secondary | ICD-10-CM | POA: Diagnosis not present

## 2016-02-07 DIAGNOSIS — K219 Gastro-esophageal reflux disease without esophagitis: Secondary | ICD-10-CM | POA: Diagnosis not present

## 2016-02-07 DIAGNOSIS — F0390 Unspecified dementia without behavioral disturbance: Secondary | ICD-10-CM | POA: Diagnosis not present

## 2016-02-07 DIAGNOSIS — I10 Essential (primary) hypertension: Secondary | ICD-10-CM | POA: Diagnosis not present

## 2016-03-06 DIAGNOSIS — K219 Gastro-esophageal reflux disease without esophagitis: Secondary | ICD-10-CM | POA: Diagnosis not present

## 2016-03-06 DIAGNOSIS — I639 Cerebral infarction, unspecified: Secondary | ICD-10-CM | POA: Diagnosis not present

## 2016-03-06 DIAGNOSIS — D494 Neoplasm of unspecified behavior of bladder: Secondary | ICD-10-CM | POA: Diagnosis not present

## 2016-03-06 DIAGNOSIS — D1721 Benign lipomatous neoplasm of skin and subcutaneous tissue of right arm: Secondary | ICD-10-CM | POA: Diagnosis not present

## 2016-03-06 DIAGNOSIS — F79 Unspecified intellectual disabilities: Secondary | ICD-10-CM | POA: Diagnosis not present

## 2016-03-06 DIAGNOSIS — F0390 Unspecified dementia without behavioral disturbance: Secondary | ICD-10-CM | POA: Diagnosis not present

## 2016-03-06 DIAGNOSIS — I10 Essential (primary) hypertension: Secondary | ICD-10-CM | POA: Diagnosis not present

## 2016-04-03 DIAGNOSIS — I639 Cerebral infarction, unspecified: Secondary | ICD-10-CM | POA: Diagnosis not present

## 2016-04-03 DIAGNOSIS — F0390 Unspecified dementia without behavioral disturbance: Secondary | ICD-10-CM | POA: Diagnosis not present

## 2016-04-03 DIAGNOSIS — D494 Neoplasm of unspecified behavior of bladder: Secondary | ICD-10-CM | POA: Diagnosis not present

## 2016-04-03 DIAGNOSIS — K219 Gastro-esophageal reflux disease without esophagitis: Secondary | ICD-10-CM | POA: Diagnosis not present

## 2016-04-03 DIAGNOSIS — I10 Essential (primary) hypertension: Secondary | ICD-10-CM | POA: Diagnosis not present

## 2016-04-03 DIAGNOSIS — F79 Unspecified intellectual disabilities: Secondary | ICD-10-CM | POA: Diagnosis not present

## 2016-05-08 DIAGNOSIS — D179 Benign lipomatous neoplasm, unspecified: Secondary | ICD-10-CM | POA: Diagnosis not present

## 2016-05-08 DIAGNOSIS — I10 Essential (primary) hypertension: Secondary | ICD-10-CM | POA: Diagnosis not present

## 2016-05-08 DIAGNOSIS — F79 Unspecified intellectual disabilities: Secondary | ICD-10-CM | POA: Diagnosis not present

## 2016-05-08 DIAGNOSIS — K219 Gastro-esophageal reflux disease without esophagitis: Secondary | ICD-10-CM | POA: Diagnosis not present

## 2016-05-08 DIAGNOSIS — I639 Cerebral infarction, unspecified: Secondary | ICD-10-CM | POA: Diagnosis not present

## 2016-05-08 DIAGNOSIS — F0390 Unspecified dementia without behavioral disturbance: Secondary | ICD-10-CM | POA: Diagnosis not present

## 2016-05-08 DIAGNOSIS — D303 Benign neoplasm of bladder: Secondary | ICD-10-CM | POA: Diagnosis not present

## 2016-05-14 DIAGNOSIS — Z79899 Other long term (current) drug therapy: Secondary | ICD-10-CM | POA: Diagnosis not present

## 2016-05-14 DIAGNOSIS — D649 Anemia, unspecified: Secondary | ICD-10-CM | POA: Diagnosis not present

## 2016-05-21 DIAGNOSIS — I10 Essential (primary) hypertension: Secondary | ICD-10-CM | POA: Diagnosis not present

## 2016-07-03 DIAGNOSIS — F0390 Unspecified dementia without behavioral disturbance: Secondary | ICD-10-CM | POA: Diagnosis not present

## 2016-07-03 DIAGNOSIS — I10 Essential (primary) hypertension: Secondary | ICD-10-CM | POA: Diagnosis not present

## 2016-07-03 DIAGNOSIS — D303 Benign neoplasm of bladder: Secondary | ICD-10-CM | POA: Diagnosis not present

## 2016-07-03 DIAGNOSIS — F79 Unspecified intellectual disabilities: Secondary | ICD-10-CM | POA: Diagnosis not present

## 2016-07-03 DIAGNOSIS — D179 Benign lipomatous neoplasm, unspecified: Secondary | ICD-10-CM | POA: Diagnosis not present

## 2016-07-03 DIAGNOSIS — K219 Gastro-esophageal reflux disease without esophagitis: Secondary | ICD-10-CM | POA: Diagnosis not present

## 2016-09-04 DIAGNOSIS — D1721 Benign lipomatous neoplasm of skin and subcutaneous tissue of right arm: Secondary | ICD-10-CM | POA: Diagnosis not present

## 2016-09-04 DIAGNOSIS — I10 Essential (primary) hypertension: Secondary | ICD-10-CM | POA: Diagnosis not present

## 2016-09-04 DIAGNOSIS — F0390 Unspecified dementia without behavioral disturbance: Secondary | ICD-10-CM | POA: Diagnosis not present

## 2016-09-04 DIAGNOSIS — I639 Cerebral infarction, unspecified: Secondary | ICD-10-CM | POA: Diagnosis not present

## 2016-09-04 DIAGNOSIS — F79 Unspecified intellectual disabilities: Secondary | ICD-10-CM | POA: Diagnosis not present

## 2016-09-04 DIAGNOSIS — K219 Gastro-esophageal reflux disease without esophagitis: Secondary | ICD-10-CM | POA: Diagnosis not present

## 2016-09-04 DIAGNOSIS — D494 Neoplasm of unspecified behavior of bladder: Secondary | ICD-10-CM | POA: Diagnosis not present

## 2016-10-09 DIAGNOSIS — I10 Essential (primary) hypertension: Secondary | ICD-10-CM | POA: Diagnosis not present

## 2016-10-09 DIAGNOSIS — F0391 Unspecified dementia with behavioral disturbance: Secondary | ICD-10-CM | POA: Diagnosis not present

## 2016-10-09 DIAGNOSIS — I639 Cerebral infarction, unspecified: Secondary | ICD-10-CM | POA: Diagnosis not present

## 2016-10-09 DIAGNOSIS — D1721 Benign lipomatous neoplasm of skin and subcutaneous tissue of right arm: Secondary | ICD-10-CM | POA: Diagnosis not present

## 2016-10-09 DIAGNOSIS — F79 Unspecified intellectual disabilities: Secondary | ICD-10-CM | POA: Diagnosis not present

## 2016-10-09 DIAGNOSIS — M542 Cervicalgia: Secondary | ICD-10-CM | POA: Diagnosis not present

## 2016-10-09 DIAGNOSIS — D303 Benign neoplasm of bladder: Secondary | ICD-10-CM | POA: Diagnosis not present

## 2016-10-14 DIAGNOSIS — M542 Cervicalgia: Secondary | ICD-10-CM | POA: Diagnosis not present

## 2016-10-16 DIAGNOSIS — M542 Cervicalgia: Secondary | ICD-10-CM | POA: Diagnosis not present

## 2016-10-17 DIAGNOSIS — M542 Cervicalgia: Secondary | ICD-10-CM | POA: Diagnosis not present

## 2016-10-18 DIAGNOSIS — M542 Cervicalgia: Secondary | ICD-10-CM | POA: Diagnosis not present

## 2016-10-20 DIAGNOSIS — M542 Cervicalgia: Secondary | ICD-10-CM | POA: Diagnosis not present

## 2016-10-21 DIAGNOSIS — M542 Cervicalgia: Secondary | ICD-10-CM | POA: Diagnosis not present

## 2016-10-22 DIAGNOSIS — M542 Cervicalgia: Secondary | ICD-10-CM | POA: Diagnosis not present

## 2016-10-23 DIAGNOSIS — M542 Cervicalgia: Secondary | ICD-10-CM | POA: Diagnosis not present

## 2016-10-24 DIAGNOSIS — M542 Cervicalgia: Secondary | ICD-10-CM | POA: Diagnosis not present

## 2016-10-27 DIAGNOSIS — M542 Cervicalgia: Secondary | ICD-10-CM | POA: Diagnosis not present

## 2016-10-30 DIAGNOSIS — K219 Gastro-esophageal reflux disease without esophagitis: Secondary | ICD-10-CM | POA: Diagnosis not present

## 2016-10-30 DIAGNOSIS — D179 Benign lipomatous neoplasm, unspecified: Secondary | ICD-10-CM | POA: Diagnosis not present

## 2016-10-30 DIAGNOSIS — I639 Cerebral infarction, unspecified: Secondary | ICD-10-CM | POA: Diagnosis not present

## 2016-10-30 DIAGNOSIS — M542 Cervicalgia: Secondary | ICD-10-CM | POA: Diagnosis not present

## 2016-10-30 DIAGNOSIS — D303 Benign neoplasm of bladder: Secondary | ICD-10-CM | POA: Diagnosis not present

## 2016-10-30 DIAGNOSIS — F79 Unspecified intellectual disabilities: Secondary | ICD-10-CM | POA: Diagnosis not present

## 2016-10-30 DIAGNOSIS — I10 Essential (primary) hypertension: Secondary | ICD-10-CM | POA: Diagnosis not present

## 2016-10-30 DIAGNOSIS — R221 Localized swelling, mass and lump, neck: Secondary | ICD-10-CM | POA: Diagnosis not present

## 2016-10-30 DIAGNOSIS — F0391 Unspecified dementia with behavioral disturbance: Secondary | ICD-10-CM | POA: Diagnosis not present

## 2016-10-31 DIAGNOSIS — M542 Cervicalgia: Secondary | ICD-10-CM | POA: Diagnosis not present

## 2016-11-01 DIAGNOSIS — M542 Cervicalgia: Secondary | ICD-10-CM | POA: Diagnosis not present

## 2016-11-05 DIAGNOSIS — M542 Cervicalgia: Secondary | ICD-10-CM | POA: Diagnosis not present

## 2016-11-06 DIAGNOSIS — M542 Cervicalgia: Secondary | ICD-10-CM | POA: Diagnosis not present

## 2016-11-13 ENCOUNTER — Other Ambulatory Visit (HOSPITAL_COMMUNITY): Payer: Self-pay | Admitting: Internal Medicine

## 2016-11-18 ENCOUNTER — Other Ambulatory Visit (HOSPITAL_COMMUNITY): Payer: Self-pay | Admitting: Internal Medicine

## 2016-11-18 DIAGNOSIS — M542 Cervicalgia: Secondary | ICD-10-CM

## 2016-11-18 DIAGNOSIS — M25519 Pain in unspecified shoulder: Secondary | ICD-10-CM

## 2016-11-19 DIAGNOSIS — I10 Essential (primary) hypertension: Secondary | ICD-10-CM | POA: Diagnosis not present

## 2016-11-19 DIAGNOSIS — Z79899 Other long term (current) drug therapy: Secondary | ICD-10-CM | POA: Diagnosis not present

## 2016-11-19 DIAGNOSIS — D649 Anemia, unspecified: Secondary | ICD-10-CM | POA: Diagnosis not present

## 2016-11-21 ENCOUNTER — Ambulatory Visit (HOSPITAL_COMMUNITY)
Admission: RE | Admit: 2016-11-21 | Discharge: 2016-11-21 | Disposition: A | Discharge status: Home / Self Care | Payer: Medicare Other | Source: Ambulatory Visit | Attending: Internal Medicine | Admitting: Internal Medicine

## 2016-11-21 DIAGNOSIS — R591 Generalized enlarged lymph nodes: Secondary | ICD-10-CM | POA: Diagnosis not present

## 2016-11-21 DIAGNOSIS — M542 Cervicalgia: Secondary | ICD-10-CM

## 2016-11-21 DIAGNOSIS — M25519 Pain in unspecified shoulder: Secondary | ICD-10-CM

## 2016-11-21 DIAGNOSIS — J320 Chronic maxillary sinusitis: Secondary | ICD-10-CM | POA: Insufficient documentation

## 2016-11-21 DIAGNOSIS — R221 Localized swelling, mass and lump, neck: Secondary | ICD-10-CM | POA: Diagnosis not present

## 2016-11-24 ENCOUNTER — Other Ambulatory Visit (HOSPITAL_COMMUNITY): Payer: Self-pay | Admitting: Internal Medicine

## 2016-11-24 DIAGNOSIS — R591 Generalized enlarged lymph nodes: Secondary | ICD-10-CM

## 2016-11-24 DIAGNOSIS — R590 Localized enlarged lymph nodes: Secondary | ICD-10-CM

## 2016-11-25 ENCOUNTER — Other Ambulatory Visit: Payer: Self-pay | Admitting: General Surgery

## 2016-11-25 DIAGNOSIS — E785 Hyperlipidemia, unspecified: Secondary | ICD-10-CM | POA: Diagnosis not present

## 2016-11-25 DIAGNOSIS — I10 Essential (primary) hypertension: Secondary | ICD-10-CM | POA: Diagnosis not present

## 2016-11-25 DIAGNOSIS — E08311 Diabetes mellitus due to underlying condition with unspecified diabetic retinopathy with macular edema: Secondary | ICD-10-CM | POA: Diagnosis not present

## 2016-11-25 DIAGNOSIS — D649 Anemia, unspecified: Secondary | ICD-10-CM | POA: Diagnosis not present

## 2016-11-26 ENCOUNTER — Other Ambulatory Visit: Payer: Self-pay | Admitting: Radiology

## 2016-11-26 ENCOUNTER — Other Ambulatory Visit: Payer: Self-pay | Admitting: Physician Assistant

## 2016-11-27 ENCOUNTER — Emergency Department (HOSPITAL_COMMUNITY): Payer: Medicare Other

## 2016-11-27 ENCOUNTER — Other Ambulatory Visit: Payer: Self-pay

## 2016-11-27 ENCOUNTER — Ambulatory Visit (HOSPITAL_COMMUNITY)
Admission: RE | Admit: 2016-11-27 | Discharge: 2016-11-27 | Disposition: A | Discharge status: Home / Self Care | Payer: Medicare Other | Source: Ambulatory Visit | Attending: Internal Medicine | Admitting: Internal Medicine

## 2016-11-27 ENCOUNTER — Encounter (HOSPITAL_COMMUNITY): Payer: Self-pay

## 2016-11-27 ENCOUNTER — Inpatient Hospital Stay (HOSPITAL_COMMUNITY)
Admission: EM | Admit: 2016-11-27 | Discharge: 2016-12-06 | DRG: 264 | Disposition: A | Discharge status: Skilled Nursing Facility | Payer: Medicare Other | Attending: Family Medicine | Admitting: Family Medicine

## 2016-11-27 DIAGNOSIS — E876 Hypokalemia: Secondary | ICD-10-CM | POA: Diagnosis not present

## 2016-11-27 DIAGNOSIS — I499 Cardiac arrhythmia, unspecified: Secondary | ICD-10-CM | POA: Diagnosis not present

## 2016-11-27 DIAGNOSIS — R319 Hematuria, unspecified: Secondary | ICD-10-CM | POA: Diagnosis present

## 2016-11-27 DIAGNOSIS — N189 Chronic kidney disease, unspecified: Secondary | ICD-10-CM | POA: Diagnosis present

## 2016-11-27 DIAGNOSIS — C8319 Mantle cell lymphoma, extranodal and solid organ sites: Secondary | ICD-10-CM | POA: Diagnosis not present

## 2016-11-27 DIAGNOSIS — R590 Localized enlarged lymph nodes: Secondary | ICD-10-CM | POA: Diagnosis present

## 2016-11-27 DIAGNOSIS — R262 Difficulty in walking, not elsewhere classified: Secondary | ICD-10-CM | POA: Diagnosis not present

## 2016-11-27 DIAGNOSIS — I509 Heart failure, unspecified: Secondary | ICD-10-CM | POA: Diagnosis present

## 2016-11-27 DIAGNOSIS — D638 Anemia in other chronic diseases classified elsewhere: Secondary | ICD-10-CM | POA: Diagnosis present

## 2016-11-27 DIAGNOSIS — I493 Ventricular premature depolarization: Secondary | ICD-10-CM | POA: Insufficient documentation

## 2016-11-27 DIAGNOSIS — D649 Anemia, unspecified: Secondary | ICD-10-CM | POA: Diagnosis not present

## 2016-11-27 DIAGNOSIS — Z8249 Family history of ischemic heart disease and other diseases of the circulatory system: Secondary | ICD-10-CM

## 2016-11-27 DIAGNOSIS — I480 Paroxysmal atrial fibrillation: Secondary | ICD-10-CM | POA: Diagnosis not present

## 2016-11-27 DIAGNOSIS — Z79899 Other long term (current) drug therapy: Secondary | ICD-10-CM

## 2016-11-27 DIAGNOSIS — F70 Mild intellectual disabilities: Secondary | ICD-10-CM | POA: Diagnosis present

## 2016-11-27 DIAGNOSIS — R310 Gross hematuria: Secondary | ICD-10-CM | POA: Diagnosis not present

## 2016-11-27 DIAGNOSIS — Z8589 Personal history of malignant neoplasm of other organs and systems: Secondary | ICD-10-CM | POA: Diagnosis not present

## 2016-11-27 DIAGNOSIS — D500 Iron deficiency anemia secondary to blood loss (chronic): Secondary | ICD-10-CM | POA: Diagnosis not present

## 2016-11-27 DIAGNOSIS — I4891 Unspecified atrial fibrillation: Secondary | ICD-10-CM

## 2016-11-27 DIAGNOSIS — I495 Sick sinus syndrome: Secondary | ICD-10-CM | POA: Diagnosis present

## 2016-11-27 DIAGNOSIS — R221 Localized swelling, mass and lump, neck: Secondary | ICD-10-CM | POA: Diagnosis present

## 2016-11-27 DIAGNOSIS — I10 Essential (primary) hypertension: Secondary | ICD-10-CM | POA: Diagnosis present

## 2016-11-27 DIAGNOSIS — R41841 Cognitive communication deficit: Secondary | ICD-10-CM | POA: Diagnosis not present

## 2016-11-27 DIAGNOSIS — R1032 Left lower quadrant pain: Secondary | ICD-10-CM

## 2016-11-27 DIAGNOSIS — K5900 Constipation, unspecified: Secondary | ICD-10-CM | POA: Diagnosis present

## 2016-11-27 DIAGNOSIS — I361 Nonrheumatic tricuspid (valve) insufficiency: Secondary | ICD-10-CM | POA: Diagnosis not present

## 2016-11-27 DIAGNOSIS — E785 Hyperlipidemia, unspecified: Secondary | ICD-10-CM | POA: Diagnosis present

## 2016-11-27 DIAGNOSIS — I498 Other specified cardiac arrhythmias: Secondary | ICD-10-CM | POA: Insufficient documentation

## 2016-11-27 DIAGNOSIS — I130 Hypertensive heart and chronic kidney disease with heart failure and stage 1 through stage 4 chronic kidney disease, or unspecified chronic kidney disease: Secondary | ICD-10-CM | POA: Diagnosis present

## 2016-11-27 DIAGNOSIS — R509 Fever, unspecified: Secondary | ICD-10-CM

## 2016-11-27 DIAGNOSIS — R591 Generalized enlarged lymph nodes: Secondary | ICD-10-CM | POA: Diagnosis not present

## 2016-11-27 DIAGNOSIS — R599 Enlarged lymph nodes, unspecified: Secondary | ICD-10-CM | POA: Diagnosis not present

## 2016-11-27 DIAGNOSIS — D509 Iron deficiency anemia, unspecified: Secondary | ICD-10-CM | POA: Diagnosis not present

## 2016-11-27 DIAGNOSIS — Z8673 Personal history of transient ischemic attack (TIA), and cerebral infarction without residual deficits: Secondary | ICD-10-CM

## 2016-11-27 DIAGNOSIS — R000 Tachycardia, unspecified: Secondary | ICD-10-CM | POA: Diagnosis not present

## 2016-11-27 DIAGNOSIS — I700 Atherosclerosis of aorta: Secondary | ICD-10-CM | POA: Diagnosis present

## 2016-11-27 DIAGNOSIS — E7800 Pure hypercholesterolemia, unspecified: Secondary | ICD-10-CM | POA: Diagnosis not present

## 2016-11-27 DIAGNOSIS — C8311 Mantle cell lymphoma, lymph nodes of head, face, and neck: Secondary | ICD-10-CM | POA: Diagnosis not present

## 2016-11-27 DIAGNOSIS — C8590 Non-Hodgkin lymphoma, unspecified, unspecified site: Secondary | ICD-10-CM | POA: Diagnosis not present

## 2016-11-27 DIAGNOSIS — Z87891 Personal history of nicotine dependence: Secondary | ICD-10-CM

## 2016-11-27 DIAGNOSIS — C8310 Mantle cell lymphoma, unspecified site: Secondary | ICD-10-CM | POA: Diagnosis present

## 2016-11-27 DIAGNOSIS — M6281 Muscle weakness (generalized): Secondary | ICD-10-CM | POA: Diagnosis not present

## 2016-11-27 DIAGNOSIS — M7989 Other specified soft tissue disorders: Secondary | ICD-10-CM | POA: Diagnosis not present

## 2016-11-27 DIAGNOSIS — C8315 Mantle cell lymphoma, lymph nodes of inguinal region and lower limb: Secondary | ICD-10-CM | POA: Diagnosis not present

## 2016-11-27 LAB — TROPONIN I

## 2016-11-27 LAB — PROTIME-INR
INR: 1.21
INR: 1.23
PROTHROMBIN TIME: 15.4 s — AB (ref 11.4–15.2)
Prothrombin Time: 15.2 seconds (ref 11.4–15.2)

## 2016-11-27 LAB — CBC
HEMATOCRIT: 28.3 % — AB (ref 39.0–52.0)
HEMATOCRIT: 27.6 % — AB (ref 39.0–52.0)
HEMOGLOBIN: 8.9 g/dL — AB (ref 13.0–17.0)
HEMOGLOBIN: 8.6 g/dL — AB (ref 13.0–17.0)
MCH: 27.6 pg (ref 26.0–34.0)
MCH: 27.3 pg (ref 26.0–34.0)
MCHC: 31.4 g/dL (ref 30.0–36.0)
MCHC: 31.2 g/dL (ref 30.0–36.0)
MCV: 87.9 fL (ref 78.0–100.0)
MCV: 87.6 fL (ref 78.0–100.0)
Platelets: 199 10*3/uL (ref 150–400)
Platelets: 194 10*3/uL (ref 150–400)
RBC: 3.22 MIL/uL — ABNORMAL LOW (ref 4.22–5.81)
RBC: 3.15 MIL/uL — ABNORMAL LOW (ref 4.22–5.81)
RDW: 16.3 % — AB (ref 11.5–15.5)
RDW: 16.3 % — AB (ref 11.5–15.5)
WBC: 4.6 10*3/uL (ref 4.0–10.5)
WBC: 4.8 10*3/uL (ref 4.0–10.5)

## 2016-11-27 LAB — BASIC METABOLIC PANEL
ANION GAP: 13 (ref 5–15)
BUN: 11 mg/dL (ref 6–20)
CO2: 23 mmol/L (ref 22–32)
Calcium: 8.6 mg/dL — ABNORMAL LOW (ref 8.9–10.3)
Chloride: 99 mmol/L — ABNORMAL LOW (ref 101–111)
Creatinine, Ser: 0.88 mg/dL (ref 0.61–1.24)
GFR calc Af Amer: >60 mL/min (ref >60)
Glucose, Bld: 89 mg/dL (ref 65–99)
POTASSIUM: 3.8 mmol/L (ref 3.5–5.1)
SODIUM: 135 mmol/L (ref 135–145)

## 2016-11-27 LAB — TSH: TSH: 2.881 u[IU]/mL (ref 0.350–4.500)

## 2016-11-27 LAB — MAGNESIUM: MAGNESIUM: 2 mg/dL (ref 1.7–2.4)

## 2016-11-27 MED ORDER — SIMVASTATIN 40 MG PO TABS
40.0000 mg | ORAL_TABLET | Freq: Every day | ORAL | Status: DC
  Administered 2016-11-27 – 2016-11-30 (×4): 40 mg via ORAL
  Filled 2016-11-27 (×5): qty 1

## 2016-11-27 MED ORDER — LISINOPRIL 10 MG PO TABS: 10.0000 mg | ORAL_TABLET | Freq: Every day | ORAL | Status: DC

## 2016-11-27 MED ORDER — ACETAMINOPHEN 325 MG PO TABS
650.0000 mg | ORAL_TABLET | ORAL | Status: DC | PRN
Start: 2016-11-27 — End: 2016-12-06
  Administered 2016-11-28 – 2016-12-02 (×7): 650 mg via ORAL
  Filled 2016-11-27 (×7): qty 2

## 2016-11-27 MED ORDER — IOPAMIDOL (ISOVUE-370) INJECTION 76%
INTRAVENOUS | Status: AC
  Administered 2016-11-27: 100 mL
  Filled 2016-11-27: qty 100

## 2016-11-27 MED ORDER — DILTIAZEM HCL 100 MG IV SOLR
5.0000 mg/h | INTRAVENOUS | Status: DC
  Administered 2016-11-27 (×2): 5 mg/h via INTRAVENOUS
  Administered 2016-11-28: 10 mg/h via INTRAVENOUS
  Filled 2016-11-27 (×3): qty 100

## 2016-11-27 MED ORDER — ASPIRIN 81 MG PO CHEW
324.0000 mg | CHEWABLE_TABLET | Freq: Once | ORAL | Status: AC
Start: 2016-11-27 — End: 2016-11-27
  Administered 2016-11-27: 324 mg via ORAL
  Filled 2016-11-27: qty 4

## 2016-11-27 MED ORDER — HEPARIN BOLUS VIA INFUSION
5000.0000 [IU] | Freq: Once | INTRAVENOUS | Status: AC
  Administered 2016-11-27: 5000 [IU] via INTRAVENOUS
  Filled 2016-11-27: qty 5000

## 2016-11-27 MED ORDER — ASPIRIN 81 MG PO CHEW
81.0000 mg | CHEWABLE_TABLET | Freq: Every day | ORAL | Status: DC
  Administered 2016-11-28 – 2016-12-06 (×9): 81 mg via ORAL
  Filled 2016-11-27 (×9): qty 1

## 2016-11-27 MED ORDER — PANTOPRAZOLE SODIUM 40 MG PO TBEC
40.0000 mg | DELAYED_RELEASE_TABLET | Freq: Every day | ORAL | Status: DC
  Administered 2016-11-28 – 2016-12-06 (×9): 40 mg via ORAL
  Filled 2016-11-27 (×9): qty 1

## 2016-11-27 MED ORDER — DILTIAZEM LOAD VIA INFUSION
20.0000 mg | Freq: Once | INTRAVENOUS | Status: AC
  Administered 2016-11-27: 20 mg via INTRAVENOUS
  Filled 2016-11-27: qty 20

## 2016-11-27 MED ORDER — LORATADINE 10 MG PO TABS
10.0000 mg | ORAL_TABLET | Freq: Every day | ORAL | Status: DC
  Administered 2016-11-28 – 2016-12-06 (×9): 10 mg via ORAL
  Filled 2016-11-27 (×9): qty 1

## 2016-11-27 MED ORDER — SODIUM CHLORIDE 0.9 % IV SOLN: INTRAVENOUS | Status: DC

## 2016-11-27 MED ORDER — LIDOCAINE HCL (PF) 1 % IJ SOLN
INTRAMUSCULAR | Status: AC
  Filled 2016-11-27: qty 30

## 2016-11-27 MED ORDER — FERROUS SULFATE 325 (65 FE) MG PO TABS
325.0000 mg | ORAL_TABLET | Freq: Two times a day (BID) | ORAL | Status: DC
Start: 2016-11-28 — End: 2016-12-06
  Administered 2016-11-28 – 2016-12-06 (×18): 325 mg via ORAL
  Filled 2016-11-27 (×18): qty 1

## 2016-11-27 MED ORDER — HEPARIN (PORCINE) IN NACL 100-0.45 UNIT/ML-% IJ SOLN
1300.0000 [IU]/h | INTRAMUSCULAR | Status: DC
  Administered 2016-11-27: 1300 [IU]/h via INTRAVENOUS
  Filled 2016-11-27: qty 250

## 2016-11-27 MED ORDER — ONDANSETRON HCL 4 MG/2ML IJ SOLN: 4.0000 mg | Freq: Four times a day (QID) | INTRAMUSCULAR | Status: DC | PRN

## 2016-11-27 NOTE — Progress Notes (Signed)
Casey,PA for radiology states pt is in AFIB and his procedure is CX.  Instructed to take pt to the ED. OK to leave pt's IV in.  Charge nurse in ED notified and pt taken to ED triage as instructed.  Pt's transporter and facility nurse, Cloyd Stagers, notified and report given

## 2016-11-27 NOTE — ED Provider Notes (Addendum)
[facility] EMERGENCY DEPARTMENT Provider Note   CSN: [id number] Arrival date & time: 11/27/16  1342     History   Chief Complaint Chief Complaint  Patient presents with  . Irregular Heart Beat    HPI [name] [name] is a 66 y.o. male. Chief complaint is rapid heart rate  HPI:  66 year old male. He resides at [facility] rehabilitation facility. In review of his charts and information provided and taken from the patient he is developed a mass in his right lateral neck angina and anemia. 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 Hemoglobin normal 2015. Most recent check 11 7 showed hemoglobin 8.6. Scheduled today for outpatient biopsy of right lateral neck mass. At preop in radiology was found to have heart rate 170 rapid A. fib. Was lightheaded. Was referred here.  Denies shortness of breath. States he feels weak. Also noted to have some swelling in his right arm which she states is been present since "1990 when I fell".  Past Medical History:  Diagnosis Date  . Allergy   . Bladder tumor   . Cataract   . CHF (congestive heart failure) ([place])   . Chronic kidney disease   . CVA (cerebral infarction)    2008  . Hyperlipidemia   . Hypertension     Patient Active Problem List   Diagnosis Date Noted  . Hyperlipidemia 10/24/2013  . 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 Constipation 08/05/2012  . Hyperglycemia 08/05/2012  . 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 HEMORRHAGE OF RECTUM AND ANUS 05/17/2008  . ABDOMINAL PAIN, LEFT LOWER QUADRANT 05/17/2008  . DYSLIPIDEMIA 04/07/2008  . 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 Essential hypertension 04/07/2008  . DIVERTICULOSIS, MILD 04/07/2008    Past Surgical History:  Procedure Laterality Date  . CHOLECYSTECTOMY    . EYE SURGERY    . FLEXIBLE SIGMOIDOSCOPY N/A 11/30/2013   Procedure: FLEXIBLE SIGMOIDOSCOPY;  Surgeon: Inda Castle, MD;  Location: [facility];  Service: Endoscopy;  Laterality: N/A;    . HOT HEMOSTASIS N/A 11/30/2013   Procedure: HOT HEMOSTASIS (ARGON PLASMA COAGULATION/BICAP);  Surgeon: Inda Castle, MD;  Location: [facility];  Service: Endoscopy;  Laterality: N/A;  . MOUTH SURGERY         Home Medications    Prior to Admission medications   Medication Sig Start Date End Date Taking? Authorizing Provider  acetaminophen (TYLENOL) 650 MG CR tablet Take 650 mg every 8 (eight) hours as needed by mouth (for pain or fever ("for 48 hours")).   Yes [provider]  Cholecalciferol (VITAMIN D-3) 1000 units CAPS Take 2,000 Units daily by mouth.   Yes [provider]  docusate sodium (COLACE) 100 MG capsule Take 100 mg by mouth 2 (two) times daily. For constipation   Yes [provider]  ferrous sulfate 325 (65 FE) MG tablet Take 325 mg 2 (two) times daily with a meal by mouth.   Yes [provider]  hydrochlorothiazide (HYDRODIURIL) 25 MG tablet Take 25 mg by mouth daily.   Yes [provider]  ibuprofen (ADVIL,MOTRIN) 400 MG tablet Take 400 mg every 6 (six) hours as needed by mouth (for pain).    Yes [provider]  lisinopril (PRINIVIL,ZESTRIL) 10 MG tablet Take 10 mg by mouth daily.   Yes [provider]  loratadine (CLARITIN) 10 MG tablet Take 10 mg daily by mouth.   Yes [provider]  omeprazole (PRILOSEC) 20 MG capsule Take 1 capsule by mouth daily.  09/17/13  Yes [provider]  simvastatin (ZOCOR) 40 MG tablet Take 40 mg by mouth at bedtime.   Yes [provider]    Family History Family History  Problem Relation Age of Onset  . Colon cancer Father   . Colon cancer Sister 60    Social History Social History   Tobacco Use  . Smoking status: Former Smoker    Last attempt to quit: 09/15/1985    Years since quitting: 31.2  . Smokeless tobacco: Never Used  Substance Use Topics  . Alcohol use: No  . Drug use: No     Allergies   Patient has no known  allergies.   Review of Systems Review of Systems  Constitutional: Negative for appetite change, chills, diaphoresis, fatigue and fever.  HENT: Negative for mouth sores, sore throat and trouble swallowing.        Neck mass. Enlarged right upper arm.  Eyes: Negative for visual disturbance.  Respiratory: Negative for cough, chest tightness, shortness of breath and wheezing.   Cardiovascular: Positive for palpitations. Negative for chest pain.  Gastrointestinal: Negative for abdominal distention, abdominal pain, diarrhea, nausea and vomiting.  Endocrine: Negative for polydipsia, polyphagia and polyuria.  Genitourinary: Negative for dysuria, frequency and hematuria.  Musculoskeletal: Negative for gait problem.  Skin: Negative for color change, pallor and rash.  Neurological: Positive for weakness. Negative for dizziness, syncope, light-headedness and headaches.  Hematological: Does not bruise/bleed easily.  Psychiatric/Behavioral: Negative for behavioral problems and confusion.     Physical Exam Updated Vital Signs BP 116/78   Pulse (!) 119   Temp 98.7 F (37.1 C) (Oral)   Resp (!) 22   SpO2 94%   Physical Exam  Constitutional: He is oriented to person, place, and time. He appears well-developed and well-nourished. No distress.  HENT:  Head: Normocephalic.  Firm right posterior cervical chain mass at the base of neck and shoulder. Not fluctuant.  Eyes: Conjunctivae are normal. Pupils are equal, round, and reactive to light. No scleral icterus.  Neck: Normal range of motion. Neck supple. No thyromegaly present.  Cardiovascular: Exam reveals no gallop and no friction rub.  No murmur heard. A. fib with RVR on the monitor.  Pulmonary/Chest: Effort normal and breath sounds normal. No respiratory distress. He has no wheezes. He has no rales.  Clear lungs.  Abdominal: Soft. Bowel sounds are normal. He exhibits no distension. There is no tenderness. There is no rebound.   Musculoskeletal: Normal range of motion.  Enlargement of right upper arm. A few areas of ecchymosis. Fluctuant. Nontender. He states this is been present since a fall in 1990. It is not documented in his chart under previous providers.  There is distention of skin veins between his neck and shoulder and his right upper chest.  Neurological: He is alert and oriented to person, place, and time.  Skin: Skin is warm and dry. No rash noted.  Psychiatric: He has a normal mood and affect. His behavior is normal.     ED Treatments / Results  Labs (all labs ordered are listed, but only abnormal results are displayed) Labs Reviewed  BASIC METABOLIC PANEL - Abnormal; Notable for the following components:      Result Value   Chloride 99 (*)    Calcium 8.6 (*)    All other components within normal limits  CBC - Abnormal; Notable for the following components:   RBC 3.15 (*)    Hemoglobin 8.6 (*)    HCT 27.6 (*)  RDW 16.3 (*)    All other components within normal limits  PROTIME-INR - Abnormal; Notable for the following components:   Prothrombin Time 15.4 (*)    All other components within normal limits  MAGNESIUM  TSH    EKG  EKG Interpretation  Date/Time:  Thursday November 27 2016 13:52:42 EST Ventricular Rate:  154 PR Interval:    QRS Duration: 86 QT Interval:  312 QTC Calculation: 499 R Axis:   76 Text Interpretation:  Atrial fibrillation with rapid ventricular response Low voltage QRS Borderline ECG Confirmed by Tanna Furry [phone]) on 11/27/2016 2:35:27 PM       Radiology Dg Chest Port 1 View  Result Date: 11/27/2016 CLINICAL DATA:  Atrial fibrillation EXAM: PORTABLE CHEST 1 VIEW COMPARISON:  None. FINDINGS: Cardiac silhouette is upper limits of normal. Mild aortic atherosclerotic calcification. No focal airspace consolidation or pulmonary edema. No pneumothorax or sizable pleural effusion. IMPRESSION: No active disease. Aortic Atherosclerosis (ICD10-I70.0).  Electronically Signed   By: Ulyses Jarred M.D.   On: 11/27/2016 15:17    Procedures Procedures (including critical care time)  Medications Ordered in ED Medications  diltiazem (CARDIZEM) 1 mg/mL load via infusion 20 mg (20 mg Intravenous Bolus from Bag 11/27/16 1528)    And  diltiazem (CARDIZEM) 100 mg in dextrose 5 % 100 mL (1 mg/mL) infusion (10 mg/hr Intravenous Rate/Dose Change 11/27/16 1600)     Initial Impression / Assessment and Plan / ED Course  I have reviewed the triage vital signs and the nursing notes.  Pertinent labs & imaging results that were available during my care of the patient were reviewed by me and considered in my medical decision making (see chart for details).   started on cardiac exam bolus and infusion for his A. fib. Ultrasound of right upper extremity rule out DVT noted with upper arm swelling, right neck mass, and venous distention.  Concern for malignancy obvious with a right neck mass in his developing anemia. Discussed with cardiology regarding treatment of his A. fib. His rate has become under control with Cardizem bolus and drip. Patient high risk for PE with mass, extremity swelling, and new A. fib. We'll obtain ultrasound of the right arm for DVT CT angiogram for possible PE.  Is cussed with Dr. Brigitte Pulse tried hospitalist. He would like completion of studies including ultrasound and CT and recontact flow manager regarding assignment a physician. Await return call from cardiology regarding consultation for his A. Fib.  CRITICAL CARE Performed by: Tanna Furry JOSEPH   Total critical care time: 30 minutes  Critical care time was exclusive of separately billable procedures and treating other patients.  Critical care was necessary to treat or prevent imminent or life-threatening deterioration.  Critical care was time spent personally by me on the following activities: development of treatment plan with patient and/or surrogate as well as nursing,  discussions with consultants, evaluation of patient's response to treatment, examination of patient, obtaining history from patient or surrogate, ordering and performing treatments and interventions, ordering and review of laboratory studies, ordering and review of radiographic studies, pulse oximetry and re-evaluation of patient's condition. care   Final Clinical Impressions(s) / ED Diagnoses   Final diagnoses:  Atrial fibrillation with rapid ventricular response [facility])  Neck mass    ED Discharge Orders    None       Tanna Furry, MD 11/27/16 1617    Tanna Furry, MD 11/27/16 [phone]

## 2016-11-27 NOTE — ED Triage Notes (Addendum)
Pt was supposed to have a biopsy on his right neck mass today, went to short stay and had irregular heart beat and sent here. Short stay tech brought patient down.Pt with heart rate in the 140s-150s, new onset afib.  Pt is from [facility]

## 2016-11-27 NOTE — H&P (Signed)
History and Physical    [name] [name] ZOX:[id number] DOB: 23-Jun-1950 DOA: 11/27/2016  PCP: Wenda Low, MD Consultants:  None Patient coming from:  [facility] since 4/08 CVA; NOK: Delana Meyer Loft[place], [phone]  Chief Complaint: afib with RVR  HPI: [name] [name] is a 66 y.o. male with medical history significant of HTN, HLD, CVA, CKD, and CHF presenting with afib.  He presented today to IR for a neck biopsy and they discovered that he had an irregular heart beat.  Per the IR PA's note, he was in afib with rate in the 130s.  He has not noticed this.  He does not notice CP or palpitations.  No SOB but he was choking on his saliva this AM and he "had to pray to the Lord to extricate and open it back up".  Not light-headed or dizzy.  No prior h/o afib.  He has an extremely large and asymmetric right upper arm in the bicep region.  When asked, the patient explained that he had a very remote injury and it has been this way ever since.  He also reports blood in his urine, stool, mouth at times.   ED Course: Afib with RVR - Cardizem bolus and drip.  RUE DVT US ordered.  Right neck mass, anemia.  Cardiology consulted.  Review of Systems: As per HPI; otherwise review of systems reviewed and negative.   Ambulatory Status:  Ambulates with a cane or walker at times  Past Medical History:  Diagnosis Date  . Allergy   . Bladder tumor   . Cataract   . CHF (congestive heart failure) ([place])   . Chronic kidney disease   . CVA (cerebral infarction)    2008  . Hyperlipidemia   . Hypertension     Past Surgical History:  Procedure Laterality Date  . CHOLECYSTECTOMY    . EYE SURGERY    . FLEXIBLE SIGMOIDOSCOPY N/A 11/30/2013   Procedure: FLEXIBLE SIGMOIDOSCOPY;  Surgeon: Inda Castle, MD;  Location: [facility];  Service: Endoscopy;  Laterality: N/A;  . HOT HEMOSTASIS N/A 11/30/2013   Procedure: HOT HEMOSTASIS (ARGON PLASMA COAGULATION/BICAP);  Surgeon: Inda Castle, MD;   Location: [facility];  Service: Endoscopy;  Laterality: N/A;  . MOUTH SURGERY      Social History   Socioeconomic History  . Marital status: Single    Spouse name: Not on file  . Number of children: Not on file  . Years of education: Not on file  . Highest education level: Not on file  Social Needs  . Financial resource strain: Not on file  . Food insecurity - worry: Not on file  . Food insecurity - inability: Not on file  . Transportation needs - medical: Not on file  . Transportation needs - non-medical: Not on file  Occupational History  . Occupation: disabled  Tobacco Use  . Smoking status: Former Smoker    Last attempt to quit: 09/15/1985    Years since quitting: 31.2  . Smokeless tobacco: Never Used  Substance and Sexual Activity  . Alcohol use: No  . Drug use: No  . Sexual activity: Not on file  Other Topics Concern  . Not on file  Social History Narrative  . Not on file    No Known Allergies  Family History  Problem Relation Age of Onset  . Colon cancer Father   . Colon cancer Sister 87    Prior to Admission medications   Medication Sig Start Date  End Date Taking? Authorizing Provider  acetaminophen (TYLENOL) 650 MG CR tablet Take 650 mg every 8 (eight) hours as needed by mouth (for pain or fever ("for 48 hours")).   Yes [provider]  Cholecalciferol (VITAMIN D-3) 1000 units CAPS Take 2,000 Units daily by mouth.   Yes [provider]  docusate sodium (COLACE) 100 MG capsule Take 100 mg by mouth 2 (two) times daily. For constipation   Yes [provider]  ferrous sulfate 325 (65 FE) MG tablet Take 325 mg 2 (two) times daily with a meal by mouth.   Yes [provider]  hydrochlorothiazide (HYDRODIURIL) 25 MG tablet Take 25 mg by mouth daily.   Yes [provider]  ibuprofen (ADVIL,MOTRIN) 400 MG tablet Take 400 mg every 6 (six) hours as needed by mouth (for pain).    Yes [provider]  lisinopril  (PRINIVIL,ZESTRIL) 10 MG tablet Take 10 mg by mouth daily.   Yes [provider]  loratadine (CLARITIN) 10 MG tablet Take 10 mg daily by mouth.   Yes [provider]  omeprazole (PRILOSEC) 20 MG capsule Take 1 capsule by mouth daily. 09/17/13  Yes [provider]  simvastatin (ZOCOR) 40 MG tablet Take 40 mg by mouth at bedtime.   Yes [provider]    Physical Exam: Vitals:   11/27/16 8938 11/27/16 1815 11/27/16 1845 11/27/16 1915  BP:   112/75 102/66  Pulse:      Resp:  20  18  Temp:      TempSrc:      SpO2: 93% 93%  93%     General:  Appears calm and comfortable and is NAD; upon discussion, it appears that the patient has a cognitive impairment/intellectual disability Eyes:  PERRL, EOMI, normal lids, iris; he has a large skin tag on his left upper eyelid ENT:  grossly normal hearing, lips & tongue, mmm; edentulous Neck:  Palpable LAD along the posterior neck base Cardiovascular:  Irregularly irregular, no m/r/g. No LE edema.  Respiratory:   CTA bilaterally with no wheezes/rales/rhonchi.  Normal respiratory effort. Abdomen:  soft, NT, ND, NABS Skin:  Scattered ecchymoses on arms primarily Musculoskeletal: grossly normal tone BUE/BLE, good ROM, no bony abnormality.  He does have a very large right upper arm that is asymmetric; he reports that this is from a remote injury and is unchanged from prior Psychiatric:  grossly normal mood and affect, speech fluent but somewhat inappropriate indicating a level of mild to moderate cognitive deficiency, AOx3 Neurologic:  CN 2-12 grossly intact, moves all extremities in coordinated fashion, sensation intact    Radiological Exams on Admission: Ct Angio Chest Pe W And/or Wo Contrast  Result Date: 11/27/2016 CLINICAL DATA:  Tachycardia. Suspect pulmonary embolism. History of hypertension, ex smoker, bladder tumor. Scheduled for neck biopsy today. EXAM: CT ANGIOGRAPHY CHEST WITH CONTRAST TECHNIQUE:  Multidetector CT imaging of the chest was performed using the standard protocol during bolus administration of intravenous contrast. Multiplanar CT image reconstructions and MIPs were obtained to evaluate the vascular anatomy. CONTRAST:  161mL ISOVUE-370 IOPAMIDOL (ISOVUE-370) INJECTION 76% COMPARISON:  CT neck November 21, 2016 and chest radiograph November 27, 2016 and CT abdomen and pelvis June 22, 2012 FINDINGS: CARDIOVASCULAR: Adequate contrast opacification of the pulmonary artery's. Main pulmonary artery is not enlarged. No pulmonary arterial filling defects to the level of the segmental branches, distal branches obscured by respiratory motion. Heart is upper limits of normal in size. Mild coronary artery calcifications. Small pericardial effusion. Thoracic  aorta is normal course and caliber, unremarkable. MEDIASTINUM/NODES: Greater than expected number of mildly enlarged mediastinal and hilar lymph nodes. Anterior mediastinal fat stranding. Bulky RIGHT greater than LEFT axillary lymphadenopathy, 12.9 x 6.7 cm RIGHT axillary nodal conglomeration. Bulky RIGHT grand LEFT supraclavicular lymphadenopathy. LUNGS/PLEURA: Tracheobronchial tree is patent, no pneumothorax. No pleural effusions, focal consolidations, pulmonary nodules or masses. UPPER ABDOMEN: New splenomegaly. 6.7 cm cyst (5 Hounsfield units) LEFT renal fossa, larger than prior CT. Stable 3 cm cyst (3 Hounsfield units) RIGHT lobe of the liver. Partially imaged probable portal caval lymphadenopathy. MUSCULOSKELETAL: Nonacute. RIGHT biceps intramuscular lipoma versus atrophy. Multilevel scattered Schmorl's nodes. Review of the MIP images confirms the above findings. IMPRESSION: 1. No acute pulmonary embolism. 2. Bulky supraclavicular, axillary and possibly portal caval lymphadenopathy. 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 3. Borderline cardiomegaly.  No acute pulmonary process. Aortic Atherosclerosis  (ICD10-I70.0). Electronically Signed   By: Elon Alas M.D.   On: 11/27/2016 17:13   Dg Chest Port 1 View  Result Date: 11/27/2016 CLINICAL DATA:  Atrial fibrillation EXAM: PORTABLE CHEST 1 VIEW COMPARISON:  None. FINDINGS: Cardiac silhouette is upper limits of normal. Mild aortic atherosclerotic calcification. No focal airspace consolidation or pulmonary edema. No pneumothorax or sizable pleural effusion. IMPRESSION: No active disease. Aortic Atherosclerosis (ICD10-I70.0). Electronically Signed   By: Ulyses Jarred M.D.   On: 11/27/2016 15:17    EKG: Independently reviewed.   1309 - afib, rate 126 1352 - afib, rate 154  Labs on Admission: I have personally reviewed the available labs and imaging studies at the time of the admission.  Pertinent labs:   Magnesium 2.0 WBC 4.8 Hgb 8.6, 8.9 - normocytic INR 1.23 TSH 2.881  Assessment/Plan Principal Problem:   Atrial fibrillation with RVR (HCC) Active Problems:   Mild intellectual disability   Essential hypertension   Normocytic anemia   Lymphadenopathy   Afib with RVR -Patient presenting with new-onset afib.  -Uncertain etiology although he does have a h/o CHF and HTN. -Normal TSH today. -He is undergoing workup for probable lymphoproliferative d/o although there is no obvious connection there -Since the afib onset is unknown, will focus on rate control and searching for the underlying cause at this time. -Will admit to SDU for Diltiazem drip as per protocol with plan to transition to PO Diltiazem once heart rate is controlled.   -Troponin x1 -Will give ASA 324 mg PO x 1 (if not already given) and daily ASA 81 mg PO daily.   -Treat chest pain with NTG prn.   -Will request Echocardiogram for further evaluation  -Will risk stratify with FLP.  -Repeat EKG in AM and plan to consult cardiology at that time (ER appears to have been unable to reach cardiology tonight). -CHA2DS2-VASc Score is at least 2 and so patient will need  oral anticoagulation.  -Will check hemoccult. -Start heparin drip per pharmacy  LAD with anemia -Swollen cervical LN -IR consult for biopsy was today but unable to be performed -Will consult IR for biopsy while he is an inpatient to facilitate start of lymphoproliferative evaluation  HTN -Hold HCTZ  -Continue Lisinopril but monitor closely while on Diltiazem drip  ID -Mild -He appears to be able to make his own decisions but likely does need some extra support    DVT prophylaxis: Heparin drip Code Status:  Full - confirmed with patient Family Communication: None present Disposition Plan:  Back to SNF once clinically improved Consults called: IR; cardiology Admission status: Admit - It  is my clinical opinion that admission to INPATIENT is reasonable and necessary because this patient will require at least 2 midnights in the hospital to treat this condition based on the medical complexity of the problems presented.  Given the aforementioned information, the predictability of an adverse outcome is felt to be significant.    Karmen Bongo MD [facility]  If note is complete, please contact covering daytime or nighttime physician. [email or web address] Password [facility]  11/27/2016, 7:49 PM

## 2016-11-27 NOTE — Progress Notes (Signed)
Spoke with Dr. Kathlene Cote about EKG results which show a fib to RVR.  Patient to go to the ED for evaluation.  No biopsy to be done today.   Spoke with RN.  May go to ED with IV in place.  Brynda Greathouse, MS RD PA-C

## 2016-11-27 NOTE — Progress Notes (Addendum)
ANTICOAGULATION CONSULT NOTE - Initial Consult  Pharmacy Consult for Heparin Indication: atrial fibrillation  No Known Allergies  Patient Measurements: Weight: 189 lb 9.5 oz (86 kg)  Height: 180.3 cm IBW: 75.3 kg Heparin Dosing Weight: 86 kg  Vital Signs: Temp: 98.2 F (36.8 C) (11/15 2108) Temp Source: Oral (11/15 2108) BP: 102/63 (11/15 2131) Pulse Rate: 85 (11/15 2108)  Labs: Recent Labs    11/27/16 1150 11/27/16 1357 11/27/16 1515  HGB 8.9* 8.6*  --   HCT 28.3* 27.6*  --   PLT 199 194  --   LABPROT 15.2  --  15.4*  INR 1.21  --  1.23  CREATININE  --  0.88  --     Estimated Creatinine Clearance: 87.9 mL/min (by C-G formula based on SCr of 0.88 mg/dL).   Medical History: Past Medical History:  Diagnosis Date  . Allergy   . Bladder tumor   . Cataract   . CHF (congestive heart failure) ([place])   . Chronic kidney disease   . CVA (cerebral infarction)    2008  . Hyperlipidemia   . Hypertension     Medications:  Medications Prior to Admission  Medication Sig Dispense Refill Last Dose  . acetaminophen (TYLENOL) 650 MG CR tablet Take 650 mg every 8 (eight) hours as needed by mouth (for pain or fever ("for 48 hours")).   PRN at PRN  . Cholecalciferol (VITAMIN D-3) 1000 units CAPS Take 2,000 Units daily by mouth.   11/27/2016 at 1000  . docusate sodium (COLACE) 100 MG capsule Take 100 mg by mouth 2 (two) times daily. For constipation   11/27/2016 at 0900  . ferrous sulfate 325 (65 FE) MG tablet Take 325 mg 2 (two) times daily with a meal by mouth.   11/27/2016 at 0900  . hydrochlorothiazide (HYDRODIURIL) 25 MG tablet Take 25 mg by mouth daily.   11/27/2016 at 1000  . ibuprofen (ADVIL,MOTRIN) 400 MG tablet Take 400 mg every 6 (six) hours as needed by mouth (for pain).    PRN at PRN  . lisinopril (PRINIVIL,ZESTRIL) 10 MG tablet Take 10 mg by mouth daily.   11/27/2016 at 1000  . loratadine (CLARITIN) 10 MG tablet Take 10 mg daily by mouth.   11/27/2016 at 1000  .  omeprazole (PRILOSEC) 20 MG capsule Take 1 capsule by mouth daily.   11/27/2016 at 0630  . simvastatin (ZOCOR) 40 MG tablet Take 40 mg by mouth at bedtime.   11/26/2016 at 2100    Assessment: 66 year old male from [place] home who presented today for IR guided neck biopsy when it was discovered he had atrial fibrillation with RVR. Pharmacy was consulted to start IV heparin. CHADsVASc =5.   Hgb 8.9>>8.6. Platelets 199>>194. INR 1.23. SCr 0.88. Estimated CrCl ~ 88 mL/min.   Goal of Therapy:  Heparin level 0.3-0.7 units/ml Monitor platelets by anticoagulation protocol: Yes   Plan:  IV Heparin bolus of 5000 units x1.  Start heparin drip at 1300 units/hr.  Heparin level in 6 hours.  Daily heparin level and CBC while on therapy.   Sloan Leiter, PharmD, BCPS, BCCCP Clinical Pharmacist Clinical phone 11/27/2016 until 11PM [phone] After hours, please call #[phone] 11/27/2016,9:35 PM

## 2016-11-27 NOTE — ED Notes (Signed)
Attempted report x1. 

## 2016-11-27 NOTE — ED Notes (Signed)
Patient given [place] sandwich and sprite per dr Billy Fischer

## 2016-11-27 NOTE — ED Notes (Signed)
Pt transported to CT ?

## 2016-11-27 NOTE — H&P (Signed)
Chief Complaint: Patient was seen in consultation today for lymphadenopathy  Referring Physician(s): Brushy  Supervising Physician: Aletta Edouard  Patient Status: [name] [name] - Out-pt  History of Present Illness: [name] [name] is a 66 y.o. male CHF, CKD, CVA, HLD, HTN who presents with swollen cervical lymph nodes. IR consulted for lymph node biopsy at the request of Dr. Lysle Rubens. Case reviewed by Dr. Kathlene Cote who approves patient for procedure.   He presents for procedure today in his usual state of health.  He has been NPO.  He does not take blood thinners.   Past Medical History:  Diagnosis Date  . Allergy   . Bladder tumor   . Cataract   . CHF (congestive heart failure)   . Chronic kidney disease   . CVA (cerebral infarction)    2008  . Hyperlipidemia   . Hypertension     Past Surgical History:  Procedure Laterality Date  . CHOLECYSTECTOMY    . EYE SURGERY    . FLEXIBLE SIGMOIDOSCOPY N/A 11/30/2013   Procedure: FLEXIBLE SIGMOIDOSCOPY;  Surgeon: Inda Castle, MD;  Location: [facility];  Service: Endoscopy;  Laterality: N/A;  . HOT HEMOSTASIS N/A 11/30/2013   Procedure: HOT HEMOSTASIS (ARGON PLASMA COAGULATION/BICAP);  Surgeon: Inda Castle, MD;  Location: [facility];  Service: Endoscopy;  Laterality: N/A;  . MOUTH SURGERY      Allergies: Patient has no known allergies.  Medications: Prior to Admission medications   Medication Sig Start Date End Date Taking? Authorizing Provider  Cholecalciferol (VITAMIN D) 2000 units tablet Take 2,000 Units daily by mouth.   Yes [provider]  docusate sodium (COLACE) 100 MG capsule Take 100 mg by mouth 2 (two) times daily. For constipation   Yes [provider]  hydrochlorothiazide (HYDRODIURIL) 25 MG tablet Take 25 mg by mouth daily.   Yes [provider]  lisinopril (PRINIVIL,ZESTRIL) 10 MG tablet Take 10 mg by mouth daily.   Yes [provider]  loratadine (CLARITIN) 10 MG  tablet Take 10 mg daily by mouth.   Yes [provider]  omeprazole (PRILOSEC) 20 MG capsule Take 1 capsule by mouth daily. 09/17/13  Yes [provider]  simvastatin (ZOCOR) 40 MG tablet Take 40 mg by mouth at bedtime.   Yes [provider]  ibuprofen (ADVIL,MOTRIN) 400 MG tablet Take 400 mg every 6 (six) hours as needed by mouth for mild pain or moderate pain.    [provider]     Family History  Problem Relation Age of Onset  . Colon cancer Father   . Colon cancer Sister 7    Social History   Socioeconomic History  . Marital status: Single    Spouse name: Not on file  . Number of children: Not on file  . Years of education: Not on file  . Highest education level: Not on file  Social Needs  . Financial resource strain: Not on file  . Food insecurity - worry: Not on file  . Food insecurity - inability: Not on file  . Transportation needs - medical: Not on file  . Transportation needs - non-medical: Not on file  Occupational History  . Not on file  Tobacco Use  . Smoking status: Former Smoker    Last attempt to quit: 09/15/1985    Years since quitting: 31.2  . Smokeless tobacco: Never Used  Substance and Sexual Activity  . Alcohol use: No  . Drug use: No  . Sexual activity: Not on file  Other Topics Concern  . Not on file  Social History Narrative  . Not on file    Review of Systems  Constitutional: Negative for fatigue and fever.  Respiratory: Negative for cough and shortness of breath.   Cardiovascular: Negative for chest pain.  Gastrointestinal: Negative for abdominal pain.  Musculoskeletal: Negative for back pain.  Psychiatric/Behavioral: Negative for behavioral problems and confusion.    Vital Signs: BP 126/85   Pulse (!) 124   Temp 98.3 F (36.8 C) (Oral)   Resp 16   Ht 5\' 11"  (1.803 m)   Wt 209 lb (94.8 kg)   SpO2 95%   BMI 29.15 kg/m   Physical Exam  Constitutional: He is oriented to person, place, and time.  He appears well-developed.  Cardiovascular: Normal rate and normal heart sounds.  irregular  Pulmonary/Chest: Effort normal and breath sounds normal. No respiratory distress.  Lymphadenopathy:    He has cervical adenopathy (large mass/node on right neck).  Neurological: He is alert and oriented to person, place, and time.  Skin: Skin is warm and dry.  Psychiatric: He has a normal mood and affect. His behavior is normal. Judgment and thought content normal.  Nursing note and vitals reviewed.   Imaging: Ct Soft Tissue Neck Wo Contrast  Result Date: 11/21/2016 CLINICAL DATA:  Initial evaluation for right-sided neck swelling. EXAM: CT NECK WITHOUT CONTRAST TECHNIQUE: Multidetector CT imaging of the neck was performed following the standard protocol without intravenous contrast. COMPARISON:  None available. FINDINGS: Pharynx and larynx: Oral cavity within normal limits without mass lesion or loculated fluid collection. Patient is edentulous. Palatine tonsils symmetric and within normal limits bilaterally. Few small calcified tonsilliths noted. Parapharyngeal fat preserved. Nasopharynx within normal limits. Retropharyngeal soft tissues demonstrate no acute abnormality. Epiglottis normal. Vallecula clear. Small amount of layering secretions present within the right hypopharynx, extending into the right piriform sinus. Remainder of the hypopharynx and supraglottic larynx grossly normal. True cords symmetric and within normal limits. Subglottic airway clear. Salivary glands: Salivary glands including the parotid and submandibular glands are within normal limits bilaterally. Thyroid: Thyroid within normal limits. Lymph nodes: Extensive bulky adenopathy seen throughout the neck and visualized upper chest, slightly worse on the right as compared to the left. Nodes are seen at essentially all levels, most prevalent within the right supraclavicular and axillary region. For reference purposes, largest discrete node  within the right supraclavicular region measures approximately 2.6 x 5.1 cm (series 2, image 85). Largest discrete nodal mass within the right axillary region measures approximately 9.4 x 3.7 cm (series 2, image 114). In the left neck, largest discrete node seen at left level III and measures 1.8 x 2.7 cm (series 2, image 71). Largest node within the left upper chest seen within the left subpectoral region and measures 5.4 x 2.9 cm (series 2, image 106). Study nodes seen within the partially visualized upper mediastinum as well, largest of which measures 1 cm in the prevascular region. Findings highly concerning for lymphoproliferative disorder/ lymphoma. Vascular: Atherosclerotic changes noted about the aortic arch and carotid bifurcations. Limited intracranial: Unremarkable. Visualized orbits: Partially visualized globes and orbital soft tissues within normal limits. Mastoids and visualized paranasal sinuses: Chronic maxillary sinusitis noted, right worse than left. Retained metallic density at the posterior right maxillary sinus. Sequelae of prior ORIF at the anterior maxillary sinuses/alveolar ridge. Visualize mastoids and middle ear cavities are clear. Skeleton: No acute osseus abnormality. No worrisome lytic or blastic osseous lesions. Straightening of the normal cervical lordosis with trace  retrolisthesis of C3 on C4. Moderate to advanced degenerate spondylolysis at C5-6. Upper chest: Partially visualized lungs are clear. Other: None. IMPRESSION: 1. Extensive bulky adenopathy throughout the neck and visualized upper chest, right worse than left, highly concerning for lymphoproliferative disorder/lymphoma. 2. No other acute abnormality within the neck. 3. Chronic maxillary sinusitis. Electronically Signed   By: Jeannine Boga M.D.   On: 11/21/2016 15:27    Labs:  CBC: Recent Labs    11/27/16 1150  WBC 4.6  HGB 8.9*  HCT 28.3*  PLT 199    COAGS: Recent Labs    11/27/16 1150  INR 1.21     BMP: No results for input(s): NA, K, CL, CO2, GLUCOSE, BUN, CALCIUM, CREATININE, GFRNONAA, GFRAA in the last 8760 hours.  Invalid input(s): CMP  LIVER FUNCTION TESTS: No results for input(s): BILITOT, AST, ALT, ALKPHOS, PROT, ALBUMIN in the last 8760 hours.  TUMOR MARKERS: No results for input(s): AFPTM, CEA, CA199, CHROMGRNA in the last 8760 hours.  Assessment and Plan: Patient with past medical history of HTN, CVA presents with complaint of swelling on right side of neck.  IR consulted for lymph node biopsy at the request of Dr. Deforest Hoyles. Case reviewed by Dr. Kathlene Cote who approves patient for procedure.  Patient presents today in their usual state of health.  He has been NPO and is not currently on blood thinners.  He does have an irregular heartbeat today and no documented history. Will obtain EKG and plan for procedure once results known.  Risks and benefits discussed with the patient including, but not limited to bleeding, infection, damage to adjacent structures or low yield requiring additional tests. All of the patient's questions were answered, patient is agreeable to proceed. Consent signed and in chart.  Thank you for this interesting consult.  I greatly enjoyed meeting LEELAND LOVELADY and look forward to participating in their care.  A copy of this report was sent to the requesting provider on this date.  Electronically Signed: Docia Barrier, PA 11/27/2016, 12:59 PM   I spent a total of  30 Minutes   in face to face in clinical consultation, greater than 50% of which was counseling/coordinating care for lymphadenopathy.

## 2016-11-28 ENCOUNTER — Inpatient Hospital Stay (HOSPITAL_COMMUNITY): Payer: Medicare Other

## 2016-11-28 ENCOUNTER — Encounter (HOSPITAL_COMMUNITY): Payer: Self-pay | Admitting: Cardiology

## 2016-11-28 DIAGNOSIS — F70 Mild intellectual disabilities: Secondary | ICD-10-CM

## 2016-11-28 DIAGNOSIS — I4891 Unspecified atrial fibrillation: Secondary | ICD-10-CM

## 2016-11-28 LAB — URINALYSIS, ROUTINE W REFLEX MICROSCOPIC
Bacteria, UA: NONE SEEN
Bilirubin Urine: NEGATIVE
Glucose, UA: NEGATIVE mg/dL
Ketones, ur: NEGATIVE mg/dL
Leukocytes, UA: NEGATIVE
Nitrite: NEGATIVE
PH: 5 (ref 5.0–8.0)
Protein, ur: 30 mg/dL — AB
Specific Gravity, Urine: 1.038 — ABNORMAL HIGH (ref 1.005–1.030)

## 2016-11-28 LAB — LIPID PANEL
CHOL/HDL RATIO: 8.4 ratio
Cholesterol: 92 mg/dL (ref 0–200)
HDL: 11 mg/dL — AB (ref >40)
LDL Cholesterol: 65 mg/dL (ref 0–99)
Triglycerides: 82 mg/dL (ref <150)
VLDL: 16 mg/dL (ref 0–40)

## 2016-11-28 LAB — MRSA PCR SCREENING: MRSA by PCR: POSITIVE — AB

## 2016-11-28 LAB — FERRITIN: FERRITIN: 392 ng/mL — AB (ref 24–336)

## 2016-11-28 LAB — IRON AND TIBC
Iron: 15 ug/dL — ABNORMAL LOW (ref 45–182)
Saturation Ratios: 8 % — ABNORMAL LOW (ref 17.9–39.5)
TIBC: 190 ug/dL — ABNORMAL LOW (ref 250–450)
UIBC: 175 ug/dL

## 2016-11-28 LAB — CBC
HEMATOCRIT: 24 % — AB (ref 39.0–52.0)
Hemoglobin: 7.5 g/dL — ABNORMAL LOW (ref 13.0–17.0)
MCH: 27.5 pg (ref 26.0–34.0)
MCHC: 31.3 g/dL (ref 30.0–36.0)
MCV: 87.9 fL (ref 78.0–100.0)
PLATELETS: 177 10*3/uL (ref 150–400)
RBC: 2.73 MIL/uL — ABNORMAL LOW (ref 4.22–5.81)
RDW: 16.5 % — AB (ref 11.5–15.5)
WBC: 3.9 10*3/uL — AB (ref 4.0–10.5)

## 2016-11-28 LAB — RETICULOCYTES
RBC.: 3.03 MIL/uL — AB (ref 4.22–5.81)
Retic Count, Absolute: 115.1 10*3/uL (ref 19.0–186.0)
Retic Ct Pct: 3.8 % — ABNORMAL HIGH (ref 0.4–3.1)

## 2016-11-28 LAB — HEPARIN LEVEL (UNFRACTIONATED)

## 2016-11-28 LAB — VITAMIN B12: Vitamin B-12: 638 pg/mL (ref 180–914)

## 2016-11-28 LAB — FOLATE: FOLATE: 14 ng/mL (ref >5.9)

## 2016-11-28 MED ORDER — FENTANYL CITRATE (PF) 100 MCG/2ML IJ SOLN
INTRAMUSCULAR | Status: AC | PRN
  Administered 2016-11-28: 50 ug via INTRAVENOUS
  Administered 2016-11-28: 25 ug via INTRAVENOUS

## 2016-11-28 MED ORDER — HEPARIN BOLUS VIA INFUSION
2500.0000 [IU] | Freq: Once | INTRAVENOUS | Status: AC
  Administered 2016-11-28: 2500 [IU] via INTRAVENOUS
  Filled 2016-11-28: qty 2500

## 2016-11-28 MED ORDER — METOPROLOL TARTRATE 12.5 MG HALF TABLET
12.5000 mg | ORAL_TABLET | ORAL | Status: AC
  Administered 2016-11-28: 12.5 mg via ORAL
  Filled 2016-11-28: qty 1

## 2016-11-28 MED ORDER — FENTANYL CITRATE (PF) 100 MCG/2ML IJ SOLN
INTRAMUSCULAR | Status: AC
  Filled 2016-11-28: qty 4

## 2016-11-28 MED ORDER — MUPIROCIN 2 % EX OINT
1.0000 "application " | TOPICAL_OINTMENT | Freq: Two times a day (BID) | CUTANEOUS | Status: AC
  Administered 2016-11-28 – 2016-12-02 (×9): 1 via NASAL
  Filled 2016-11-28: qty 22

## 2016-11-28 MED ORDER — LIDOCAINE-EPINEPHRINE 1 %-1:100000 IJ SOLN
INTRAMUSCULAR | Status: AC
  Filled 2016-11-28: qty 1

## 2016-11-28 MED ORDER — MIDAZOLAM HCL 2 MG/2ML IJ SOLN
INTRAMUSCULAR | Status: AC
  Filled 2016-11-28: qty 4

## 2016-11-28 MED ORDER — METOPROLOL TARTRATE 12.5 MG HALF TABLET
12.5000 mg | ORAL_TABLET | Freq: Four times a day (QID) | ORAL | Status: DC
  Administered 2016-11-28 – 2016-11-29 (×4): 12.5 mg via ORAL
  Filled 2016-11-28 (×4): qty 1

## 2016-11-28 MED ORDER — CHLORHEXIDINE GLUCONATE CLOTH 2 % EX PADS
6.0000 | MEDICATED_PAD | Freq: Every day | CUTANEOUS | Status: AC
  Administered 2016-11-30 – 2016-12-02 (×3): 6 via TOPICAL

## 2016-11-28 MED ORDER — MIDAZOLAM HCL 2 MG/2ML IJ SOLN
INTRAMUSCULAR | Status: AC | PRN
  Administered 2016-11-28 (×2): 1 mg via INTRAVENOUS

## 2016-11-28 MED ORDER — HEPARIN (PORCINE) IN NACL 100-0.45 UNIT/ML-% IJ SOLN: 1550.0000 [IU]/h | INTRAMUSCULAR | Status: DC

## 2016-11-28 NOTE — Consult Note (Signed)
Cardiology Consult    Patient ID: [name] [name]; [id number]; [birth year]   Admit date: 11/27/2016 Date of Consult: 11/28/2016  Primary Care Provider: Wenda Low, MD Primary Cardiologist: New to [facility] - Dr. Percival Spanish  Patient Profile    [name] [name] is a 66 y.o. male with past medical history of HTN, HLD, and prior CVA (occurring in 2008) who is being seen today for the evaluation of atrial fibrillation at the request of Dr. Teryl Lucy.   History of Present Illness    [name] presented to [facility] on 11/27/2016 for a lymph node biopsy and was found to new-onset atrial fibrillation with RVR, HR in the 130's upon arrival. Therefore, he was sent to the ED for further evaluation. He denied any chest pain, palpitations, or dyspnea at that time.   Initial labs showed WBC of 4.8, Hgb 8.6, platelets 194, K+ 3.8, creatinine 0.88, TSH 2.881, Mg 2.0, and initial troponin negative. EKG showed atrial fibrillation with RVR, HR 126. CXR showed no acute cardiopulmonary abnormalities. Chest CT showing no acute PE but supraclavicular, axillary, and possible portal lymphadenopathy were noted along with mild coronary artery calcifications.   He was started on IV Cardizem at the time of admission and HR has been well-controlled in the 70's - 90's overnight. Was also started on Heparin for anticoagulation.   The patient just returned from his biopsy and denies any pain at this time. No immediate complications noted regarding his procedure. He denies any recent chest pain, palpitations, dyspnea on exertion, orthopnea, PND, or lower extremity edema. No recent melena or hematochezia but has experienced hematuria for several months per his report.   No known history of CAD, CHF (although this is listed in his chart), or arrhythmias. No known family history of cardiac abnormalities. Reports his sister had HTN. He is a former smoker, having started at age 3 but quit in the 1980's. Denies any alcohol use or  recreational drug use.   He has resided at [facility] since his CVA in 2008. Uses a rolling walker for ambulation and denies any recent falls.   Past Medical History:  Diagnosis Date  . Bladder tumor   . Cataract   . CVA (cerebral infarction)    2008  . Hyperlipidemia   . Hypertension     Past Surgical History:  Procedure Laterality Date  . CHOLECYSTECTOMY    . EYE SURGERY    . FLEXIBLE SIGMOIDOSCOPY N/A 11/30/2013   Performed by Inda Castle, MD at [place]  . HOT HEMOSTASIS (ARGON PLASMA COAGULATION/BICAP) N/A 11/30/2013   Performed by Inda Castle, MD at [facility]  . MOUTH SURGERY       Home Medications:  Prior to Admission medications   Medication Sig Start Date End Date Taking? Authorizing Provider  acetaminophen (TYLENOL) 650 MG CR tablet Take 650 mg every 8 (eight) hours as needed by mouth (for pain or fever ("for 48 hours")).   Yes [provider]  Cholecalciferol (VITAMIN D-3) 1000 units CAPS Take 2,000 Units daily by mouth.   Yes [provider]  docusate sodium (COLACE) 100 MG capsule Take 100 mg by mouth 2 (two) times daily. For constipation   Yes [provider]  ferrous sulfate 325 (65 FE) MG tablet Take 325 mg 2 (two) times daily with a meal by mouth.   Yes [provider]  hydrochlorothiazide (HYDRODIURIL) 25 MG tablet Take 25 mg by mouth daily.   Yes [provider]  ibuprofen (ADVIL,MOTRIN) 400 MG tablet Take 400 mg every 6 (six) hours as needed by mouth (for pain).    Yes [provider]  lisinopril (PRINIVIL,ZESTRIL) 10 MG tablet Take 10 mg by mouth daily.   Yes [provider]  loratadine (CLARITIN) 10 MG tablet Take 10 mg daily by mouth.   Yes [provider]  omeprazole (PRILOSEC) 20 MG capsule Take 1 capsule by mouth daily. 09/17/13  Yes [provider]  simvastatin (ZOCOR) 40 MG tablet Take 40 mg by mouth at bedtime.   Yes [provider]     Inpatient Medications: Scheduled Meds: . aspirin  81 mg Oral Daily  . Chlorhexidine Gluconate Cloth  6 each Topical Q0600  . ferrous sulfate  325 mg Oral BID WC  . lidocaine-EPINEPHrine      . loratadine  10 mg Oral Daily  . mupirocin ointment  1 application Nasal BID  . pantoprazole  40 mg Oral Daily  . simvastatin  40 mg Oral QHS   Continuous Infusions: . diltiazem (CARDIZEM) infusion 10 mg/hr (11/28/16 1610)  . heparin Stopped (11/28/16 0950)   PRN Meds: acetaminophen, ondansetron (ZOFRAN) IV  Allergies:   No Known Allergies  Social History:   Social History   Socioeconomic History  . Marital status: Single    Spouse name: Not on file  . Number of children: Not on file  . Years of education: Not on file  . Highest education level: Not on file  Social Needs  . Financial resource strain: Not on file  . Food insecurity - worry: Not on file  . Food insecurity - inability: Not on file  . Transportation needs - medical: Not on file  . Transportation needs - non-medical: Not on file  Occupational History  . Occupation: disabled  Tobacco Use  . Smoking status: Former Smoker    Last attempt to quit: 09/15/1985    Years since quitting: 31.2  . Smokeless tobacco: Never Used  . Tobacco comment: Started at age 62, quit in the 1980's  Substance and Sexual Activity  . Alcohol use: No  . Drug use: No  . Sexual activity: Not on file  Other Topics Concern  . Not on file  Social History Narrative  . Not on file     Family History:    Family History  Problem Relation Age of Onset  . Colon cancer Father   . Colon cancer Sister 64  . Hypertension Sister       Review of Systems    General:  No chills, fever, night sweats or weight changes.  Cardiovascular:  No chest pain, dyspnea on exertion, edema, orthopnea, palpitations, paroxysmal nocturnal dyspnea. Dermatological: No rash, lesions/masses Respiratory: No cough, dyspnea Urologic: No dysuria. Positive for  hematuria.  Abdominal:   No nausea, vomiting, diarrhea, bright red blood per rectum, melena, or hematemesis Neurologic:  No visual changes, wkns, changes in mental status. All other systems reviewed and are otherwise negative except as noted above.  Physical Exam/Data    Vitals:   11/28/16 1105 11/28/16 1110 11/28/16 1117 11/28/16 1125  BP: (!) 101/59 101/63 102/67 97/60  Pulse: 92 85 93 88  Resp: (!) 26 (!) 21 (!) 21 (!) 24  Temp:      TempSrc:      SpO2: 98% 95% 94% 96%  Weight:      Height:        Intake/Output Summary (Last 24 hours) at 11/28/2016 1212 Last data filed at  11/28/2016 0537 Gross per 24 hour  Intake 231.26 ml  Output 250 ml  Net -18.74 ml   Filed Weights   11/27/16 2108 11/28/16 0532  Weight: 189 lb 9.5 oz (86 kg) 187 lb 6.3 oz (85 kg)   Body mass index is 26.14 kg/m.   General: Pleasant elderly Caucasian male appearing in NAD Psych: Normal affect. Tangential speech.  Neuro: Alert and oriented X 3. Moves all extremities spontaneously. HEENT: Normal  Neck: Supple without bruits or JVD. Lungs:  Resp regular and unlabored, CTA without wheezing or rales. Heart: irregularly irregular, no s3, s4, or murmurs. Abdomen: Soft, non-tender, non-distended, BS + x 4.  Extremities: No clubbing, cyanosis or lower extremity edema. DP/PT/Radials 2+ and equal bilaterally. Right upper arm significantly enlarged.    EKG:  The EKG was personally reviewed and demonstrates: Atrial fibrillation, HR 126. Telemetry:  Telemetry was personally reviewed and demonstrates:  Atrial fibrillation, HR in 70's - 90's.    Labs/Studies     Relevant CV Studies:  Echocardiogram: Pending  Laboratory Data:  Chemistry Recent Labs  Lab 11/27/16 1357  NA 135  K 3.8  CL 99*  CO2 23  GLUCOSE 89  BUN 11  CREATININE 0.88  CALCIUM 8.6*  GFRNONAA >60  GFRAA >60  ANIONGAP 13    No results for input(s): PROT, ALBUMIN, AST, ALT, ALKPHOS, BILITOT in the last 168  hours. Hematology Recent Labs  Lab 11/27/16 1150 11/27/16 1357 11/28/16 0550  WBC 4.6 4.8 3.9*  RBC 3.22* 3.15* 2.73*  HGB 8.9* 8.6* 7.5*  HCT 28.3* 27.6* 24.0*  MCV 87.9 87.6 87.9  MCH 27.6 27.3 27.5  MCHC 31.4 31.2 31.3  RDW 16.3* 16.3* 16.5*  PLT 199 194 177   Cardiac Enzymes Recent Labs  Lab 11/27/16 2130  TROPONINI <0.03   No results for input(s): TROPIPOC in the last 168 hours.  BNPNo results for input(s): BNP, PROBNP in the last 168 hours.  DDimer No results for input(s): DDIMER in the last 168 hours.  Radiology/Studies:  Ct Angio Chest Pe W And/or Wo Contrast  Result Date: 11/27/2016 CLINICAL DATA:  Tachycardia. Suspect pulmonary embolism. History of hypertension, ex smoker, bladder tumor. Scheduled for neck biopsy today. EXAM: CT ANGIOGRAPHY CHEST WITH CONTRAST TECHNIQUE: Multidetector CT imaging of the chest was performed using the standard protocol during bolus administration of intravenous contrast. Multiplanar CT image reconstructions and MIPs were obtained to evaluate the vascular anatomy. CONTRAST:  157mL ISOVUE-370 IOPAMIDOL (ISOVUE-370) INJECTION 76% COMPARISON:  CT neck November 21, 2016 and chest radiograph November 27, 2016 and CT abdomen and pelvis June 22, 2012 FINDINGS: CARDIOVASCULAR: Adequate contrast opacification of the pulmonary artery's. Main pulmonary artery is not enlarged. No pulmonary arterial filling defects to the level of the segmental branches, distal branches obscured by respiratory motion. Heart is upper limits of normal in size. Mild coronary artery calcifications. Small pericardial effusion. Thoracic aorta is normal course and caliber, unremarkable. MEDIASTINUM/NODES: Greater than expected number of mildly enlarged mediastinal and hilar lymph nodes. Anterior mediastinal fat stranding. Bulky RIGHT greater than LEFT axillary lymphadenopathy, 12.9 x 6.7 cm RIGHT axillary nodal conglomeration. Bulky RIGHT grand LEFT supraclavicular lymphadenopathy.  LUNGS/PLEURA: Tracheobronchial tree is patent, no pneumothorax. No pleural effusions, focal consolidations, pulmonary nodules or masses. UPPER ABDOMEN: New splenomegaly. 6.7 cm cyst (5 Hounsfield units) LEFT renal fossa, larger than prior CT. Stable 3 cm cyst (3 Hounsfield units) RIGHT lobe of the liver. Partially imaged probable portal caval lymphadenopathy. MUSCULOSKELETAL: Nonacute. RIGHT biceps intramuscular lipoma versus atrophy.  Multilevel scattered Schmorl's nodes. Review of the MIP images confirms the above findings. IMPRESSION: 1. No acute pulmonary embolism. 2. Bulky supraclavicular, axillary and possibly portal caval lymphadenopathy. 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 3. Borderline cardiomegaly.  No acute pulmonary process. Aortic Atherosclerosis (ICD10-I70.0). Electronically Signed   By: Elon Alas M.D.   On: 11/27/2016 17:13   Dg Chest Port 1 View  Result Date: 11/27/2016 CLINICAL DATA:  Atrial fibrillation EXAM: PORTABLE CHEST 1 VIEW COMPARISON:  None. FINDINGS: Cardiac silhouette is upper limits of normal. Mild aortic atherosclerotic calcification. No focal airspace consolidation or pulmonary edema. No pneumothorax or sizable pleural effusion. IMPRESSION: No active disease. Aortic Atherosclerosis (ICD10-I70.0). Electronically Signed   By: Ulyses Jarred M.D.   On: 11/27/2016 15:17     Assessment & Plan    1. New-onset Atrial Fibrillation - patient presented to [facility] for a planned lymph node biopsy and was found to be in  presumed new-onset atrial fibrillation with RVR. He denies any recent chest pain, palpitations, or dyspnea and is unaware of his arrhythmia.  - TSH and electrolytes WNL. Echocardiogram is pending to assess LV function and wall motion.  - This patients CHA2DS2-VASc Score and unadjusted Ischemic Stroke Rate (% per year) is equal to 7.2 % stroke rate/year from a score of 5 (HTN, CVA (2), Coronary Calcifications,  Age). Currently on Heparin. Would recommend long-term anticoagulation with his elevated stroke risk, but he needs further evaluation of his anemia and hematuria prior to initiation of this. With him being unable to tolerate anticoagulation at this time, would pursue a rate-control strategy. Currently on IV Cardizem at 10 mg/hr. Will switch to BB therapy in anticipation that his EF might be reduced and the interaction of Simvastatin with Cardizem. Start PO Lopressor 12.5mg  Q6H. Can switch to Toprol-XL for improved compliance once BP stabilizes.   2. Normocytic Anemia/ Hematuria - Hgb 8.6 on admission, at 7.5 today. FOBT pending. He does note hematuria for the past several months.  - further evaluation per admitting team.   3. HTN - BP has been soft at 92/52 - 122/94 this admission.  - recommend dose reduction of Lisinopril to 5mg  daily or discontinuation of the medication to allow for titration of AV nodal blocking agents.   4. HLD -  Lipid Panel shows total cholesterol of 92, HDL 11, and LDL 65. - continue Simvastatin 40mg  daily (if needing to use Cardizem long-term, would need to switch statin therapy).    For questions or updates, please contact [facility] Please consult [email or web address] for contact info under Cardiology/STEMI.  Signed, Erma Heritage, PA-C 11/28/2016, 12:12 PM Pager: [phone]  History and all data above reviewed.  Patient examined.  The patient presented for biopsy and was noted to be in atrial fib.  He has no past cardiac history.  He has no family and lives in a nursing home since a stroke.  However, he can ambulate with a cane and walker or holding on to bannisters.  The patient denies any new symptoms such as chest discomfort, neck or arm discomfort. There has been no new shortness of breath, PND or orthopnea. There have been no reported palpitations, presyncope or syncope.   I agree with the findings as above.  The patient exam reveals [id number], no murmur   ,  Lungs: Clear  ,  Abd: Positive bowel sounds, no rebound no guarding, Ext Chronic swelling of the right upper arm and neck.    .  All  available labs, radiology testing, previous records reviewed. Agree with documented assessment and plan. ATRIAL FIB:  He will be transition to PO beta blocker with IR q6 hours.  We can transition this to once daily pending his HR response.  His BPs are low and I will stop the lisinopril.  Echo also is ordered to further guide medical therapy.  He would certainly have an indication for anticoagulation.  However, he reports hematuria and has anemia that needs to be explained before this can be started.  There would need to be a definitive statement by his primary providers or others who will be evaluating his anemia at some point so that he can either be declared an anticoagulation candidate or not.    Jeneen Rinks Wandra Babin  12:24 PM  11/28/2016

## 2016-11-28 NOTE — Progress Notes (Signed)
Pt had fever of 101, HR elevated 115-135. Notified Cardiology and [facility] on call. UA obtained. Medication administered. New orders placed. Will continue to monitor.

## 2016-11-28 NOTE — Progress Notes (Addendum)
PROGRESS NOTE    [name] [name]  ZCH:[id number] DOB: 1950-06-12 DOA: 11/27/2016 PCP: Wenda Low, MD   Brief Narrative: [name] [name] is a 66 y.o. male with medical history significant of HTN, HLD, CVA, CKD, and CHF presenting with afib. He was admitted for atrial fibrillation with RVR while attempting to undergo a core biopsy.     Assessment & Plan:   Principal Problem:   Atrial fibrillation with RVR (HCC) Active Problems:   Mild intellectual disability   Essential hypertension   Normocytic anemia   Lymphadenopathy   Atrial fibrillation with RVR Patient started on Cardizem drip and heparin drip -cardiology recommendations: transition from cardizem to metoprolol; discontinue heparin drip -echocardiogram pending  Normocytic anemia Patient apparently with hematuria, hematochezia and finds blood in his mouth at time. Unknown etiology. INR normal. Hemoglobin slightly down today. Chart review shows problem of bladder neoplasm and history of rectal bleeding. Colonoscopy three years ago significant for a polyp in sigmoid colon and colonic polyposis. Flexible sigmoidoscopy 2 years ago 5 cm pedunculated polyp. Pathology included results of tubular adenoma and lipoma. 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 -urinalysis -FOBT -anemia panel -repeat CBC  Left cervical lymphadenopathy IR performed biopsy today (11/28/16). PCP can follow-up results  Essential hypertension Normotensive. -metoprolol per cardiology  History of CVA Hyperlipidemia -continue Simvastatin  CHF Listed as problem. No available echocardiogram -echocardiogram pending  Mild intellectual disability   DVT prophylaxis: SCDs Code Status: Full code Family Communication: None at bedside Disposition Plan: Discharge to SNF when medically stable   Consultants:   Cardiology  Interventional radiology  Procedures:   Lymph node biopsy  (11/16)  Antimicrobials:  None    Subjective: Patient reports no issues overnight. No chest pain or dyspnea.  Objective: Vitals:   11/28/16 1125 11/28/16 1200 11/28/16 1215 11/28/16 1230  BP: 97/60 104/69 101/65 115/74  Pulse: 88 (!) 126 97 (!) 41  Resp: (!) 24 (!) 29 (!) 22 (!) 22  Temp:      TempSrc:      SpO2: 96% 93% 94% 94%  Weight:      Height:        Intake/Output Summary (Last 24 hours) at 11/28/2016 1404 Last data filed at 11/28/2016 0537 Gross per 24 hour  Intake 231.26 ml  Output 250 ml  Net -18.74 ml   Filed Weights   11/27/16 2108 11/28/16 0532  Weight: 86 kg (189 lb 9.5 oz) 85 kg (187 lb 6.3 oz)    Examination:  General exam: Appears calm and comfortable HEENT: right cervical neck adenopathy without tenderness Respiratory system: Clear to auscultation. Respiratory effort normal. Cardiovascular system: S1 & S2 heard, Irregular rhythm, normal rate. No murmurs, rubs, gallops or clicks. Gastrointestinal system: Abdomen is nondistended, soft and nontender. No organomegaly or masses felt. Normal bowel sounds heard. Central nervous system: Alert and oriented. No focal neurological deficits. Extremities: No edema. No calf tenderness. Right arm is significantly enlarged compared to left with no tenderness Skin: No cyanosis. No rashes Psychiatry: Judgement and insight appear normal. Mood & affect appropriate.     Data Reviewed: I have personally reviewed following labs and imaging studies  CBC: Recent Labs  Lab 11/27/16 1150 11/27/16 1357 11/28/16 0550  WBC 4.6 4.8 3.9*  HGB 8.9* 8.6* 7.5*  HCT 28.3* 27.6* 24.0*  MCV 87.9 87.6 87.9  PLT 199 194 287   Basic Metabolic Panel: Recent Labs  Lab 11/27/16 1357 11/27/16 1515  NA 135  --  K 3.8  --   CL 99*  --   CO2 23  --   GLUCOSE 89  --   BUN 11  --   CREATININE 0.88  --   CALCIUM 8.6*  --   MG  --  2.0   GFR: Estimated Creatinine Clearance: 87.9 mL/min (by C-G formula based on SCr of  0.88 mg/dL). Liver Function Tests: No results for input(s): AST, ALT, ALKPHOS, BILITOT, PROT, ALBUMIN in the last 168 hours. No results for input(s): LIPASE, AMYLASE in the last 168 hours. No results for input(s): AMMONIA in the last 168 hours. Coagulation Profile: Recent Labs  Lab 11/27/16 1150 11/27/16 1515  INR 1.21 1.23   Cardiac Enzymes: Recent Labs  Lab 11/27/16 2130  TROPONINI <0.03   BNP (last 3 results) No results for input(s): PROBNP in the last 8760 hours. HbA1C: No results for input(s): HGBA1C in the last 72 hours. CBG: No results for input(s): GLUCAP in the last 168 hours. Lipid Profile: Recent Labs    11/28/16 0550  CHOL 92  HDL 11*  LDLCALC 65  TRIG 82  CHOLHDL 8.4   Thyroid Function Tests: Recent Labs    11/27/16 1515  TSH 2.881   Anemia Panel: No results for input(s): VITAMINB12, FOLATE, FERRITIN, TIBC, IRON, RETICCTPCT in the last 72 hours. Sepsis Labs: No results for input(s): PROCALCITON, LATICACIDVEN in the last 168 hours.  Recent Results (from the past 240 hour(s))  MRSA PCR Screening     Status: Abnormal   Collection Time: 11/27/16  9:53 PM  Result Value Ref Range Status   MRSA by PCR POSITIVE (A) NEGATIVE Final    Comment:        The GeneXpert MRSA Assay (FDA approved for NASAL specimens only), is one component of a comprehensive MRSA colonization surveillance program. It is not intended to diagnose MRSA infection nor to guide or monitor treatment for MRSA infections. RESULT CALLED TO, READ BACK BY AND VERIFIED WITH: RN Madolyn Frieze [phone] [id number]          Radiology Studies: Ct Angio Chest Pe W And/or Wo Contrast  Result Date: 11/27/2016 CLINICAL DATA:  Tachycardia. Suspect pulmonary embolism. History of hypertension, ex smoker, bladder tumor. Scheduled for neck biopsy today. EXAM: CT ANGIOGRAPHY CHEST WITH CONTRAST TECHNIQUE: Multidetector CT imaging of the chest was performed using the standard protocol during bolus  administration of intravenous contrast. Multiplanar CT image reconstructions and MIPs were obtained to evaluate the vascular anatomy. CONTRAST:  164mL ISOVUE-370 IOPAMIDOL (ISOVUE-370) INJECTION 76% COMPARISON:  CT neck November 21, 2016 and chest radiograph November 27, 2016 and CT abdomen and pelvis June 22, 2012 FINDINGS: CARDIOVASCULAR: Adequate contrast opacification of the pulmonary artery's. Main pulmonary artery is not enlarged. No pulmonary arterial filling defects to the level of the segmental branches, distal branches obscured by respiratory motion. Heart is upper limits of normal in size. Mild coronary artery calcifications. Small pericardial effusion. Thoracic aorta is normal course and caliber, unremarkable. MEDIASTINUM/NODES: Greater than expected number of mildly enlarged mediastinal and hilar lymph nodes. Anterior mediastinal fat stranding. Bulky RIGHT greater than LEFT axillary lymphadenopathy, 12.9 x 6.7 cm RIGHT axillary nodal conglomeration. Bulky RIGHT grand LEFT supraclavicular lymphadenopathy. LUNGS/PLEURA: Tracheobronchial tree is patent, no pneumothorax. No pleural effusions, focal consolidations, pulmonary nodules or masses. UPPER ABDOMEN: New splenomegaly. 6.7 cm cyst (5 Hounsfield units) LEFT renal fossa, larger than prior CT. Stable 3 cm cyst (3 Hounsfield units) RIGHT lobe of the liver. Partially imaged probable portal caval lymphadenopathy. MUSCULOSKELETAL:  Nonacute. RIGHT biceps intramuscular lipoma versus atrophy. Multilevel scattered Schmorl's nodes. Review of the MIP images confirms the above findings. IMPRESSION: 1. No acute pulmonary embolism. 2. Bulky supraclavicular, axillary and possibly portal caval lymphadenopathy. 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 3. Borderline cardiomegaly.  No acute pulmonary process. Aortic Atherosclerosis (ICD10-I70.0). Electronically Signed   By: Elon Alas M.D.   On: 11/27/2016 17:13   Dg Chest  Port 1 View  Result Date: 11/27/2016 CLINICAL DATA:  Atrial fibrillation EXAM: PORTABLE CHEST 1 VIEW COMPARISON:  None. FINDINGS: Cardiac silhouette is upper limits of normal. Mild aortic atherosclerotic calcification. No focal airspace consolidation or pulmonary edema. No pneumothorax or sizable pleural effusion. IMPRESSION: No active disease. Aortic Atherosclerosis (ICD10-I70.0). Electronically Signed   By: Ulyses Jarred M.D.   On: 11/27/2016 15:17   [place] Core Biopsy (lymph Nodes)  Result Date: 11/28/2016 INDICATION: Bulky cervical axillary lymphadenopathy. Please perform ultrasound-guided biopsy for tissue diagnostic purposes. EXAM: ULTRASOUND-GUIDED RIGHT CERVICAL LYMPH NODE BIOPSY COMPARISON:  Neck CT - 11/21/2016; chest CT - 11/17/2016 MEDICATIONS: None ANESTHESIA/SEDATION: Moderate (conscious) sedation was employed during this procedure. A total of Versed 2 mg and Fentanyl 75 mcg was administered intravenously. Moderate Sedation Time: 10 minutes. The patient's level of consciousness and vital signs were monitored continuously by radiology nursing throughout the procedure under my direct supervision. COMPLICATIONS: None immediate. TECHNIQUE: Informed written consent was obtained from the patient after a discussion of the risks, benefits and alternatives to treatment. Questions regarding the procedure were encouraged and answered. Initial ultrasound scanning demonstrated multiple enlarged cervical lymph nodes. A dominant approximately 3.2 x 1.6 cm lymph node within the superficial aspect of the mid right lateral neck was targeted for biopsy given lymph node location and sonographic window. An ultrasound image was saved for documentation purposes. The procedure was planned. A timeout was performed prior to the initiation of the procedure. The operative was prepped and draped in the usual sterile fashion, and a sterile drape was applied covering the operative field. A timeout was performed prior to the  initiation of the procedure. Local anesthesia was provided with 1% lidocaine with epinephrine. Under direct ultrasound guidance, an 18 gauge core needle device was utilized to obtain to obtain 6 core needle biopsies of the dominant right cervical lymph node. The samples were placed in saline and submitted to pathology. The needle was removed and hemostasis was achieved with manual compression. Post procedure scan was negative for significant hematoma. A dressing was placed. The patient tolerated the procedure well without immediate postprocedural complication. IMPRESSION: Technically successful ultrasound guided biopsy of dominant right cervical lymph node. Electronically Signed   By: Sandi Mariscal M.D.   On: 11/28/2016 13:48        Scheduled Meds: . aspirin  81 mg Oral Daily  . Chlorhexidine Gluconate Cloth  6 each Topical Q0600  . ferrous sulfate  325 mg Oral BID WC  . lidocaine-EPINEPHrine      . loratadine  10 mg Oral Daily  . metoprolol tartrate  12.5 mg Oral Q6H  . mupirocin ointment  1 application Nasal BID  . pantoprazole  40 mg Oral Daily  . simvastatin  40 mg Oral QHS   Continuous Infusions:   LOS: 1 day     Cordelia Poche, MD [facility] 11/28/2016, 2:04 PM Pager: [phone]  If 7PM-7AM, please contact night-coverage [email or web address] Password [email or web address] 11/28/2016, 2:04 PM

## 2016-11-28 NOTE — Procedures (Signed)
Pre Procedure Dx: Cervical lymphadenopathy Post Procedural Dx: Same  Technically successful US guided biopsy of indeterminate right cervical lymph node.  EBL: None  No immediate complications.   Ronny Bacon, MD Pager #: [phone]

## 2016-11-28 NOTE — Progress Notes (Signed)
Patient was scheduled for outpatient lymph node biopsy yesterday however was found to be in a fib with RVR and admitted overnight.   PA assessed this AM.   Physical Exam  Constitutional: He is oriented to person, place, and time. No distress.  Cardiovascular: Normal heart sounds.  Irregular irregular  Pulmonary/Chest: Effort normal and breath sounds normal. No respiratory distress.  Neurological: He is alert and oriented to person, place, and time.  Skin: Skin is warm and dry.  Psychiatric: Mood, memory, affect and judgment normal.   Anticipate biopsy today as schedule allows.  Risks and benefits discussed with the patient including, but not limited to bleeding, infection, damage to adjacent structures or low yield requiring additional tests. All of the patient's questions were answered, patient is agreeable to proceed. Consent signed and in chart.  Brynda Greathouse, MS RD PA-C 8:58 AM

## 2016-11-28 NOTE — Progress Notes (Signed)
Chaplain came to [name] [name] room by way of [place]. [name] [name] readily offers his belief in the power of prayer. In a moving way [name] [name] ask for prayer not only for himself but also for prayer for the world and for others who are also walking through seasons of illness. Chaplain provided prayer and ministry of comfort.

## 2016-11-28 NOTE — Plan of Care (Signed)
Pt remains free from CP. VSS. Pt remains in A-fib w/ controlled rate. HR 80-110. Cardizem running at 10mg /hr. Heparin running as ordered. Will continue to monitor.

## 2016-11-28 NOTE — Progress Notes (Signed)
ANTICOAGULATION CONSULT NOTE - Follow- Up Consult  Pharmacy Consult for Heparin Indication: atrial fibrillation  No Known Allergies  Patient Measurements: Height: 5\' 11"  (180.3 cm) Weight: 187 lb 6.3 oz (85 kg) IBW/kg (Calculated) : 75.3  Height: 180.3 cm IBW: 75.3 kg Heparin Dosing Weight: 86 kg  Vital Signs: Temp: 98 F (36.7 C) (11/16 0532) Temp Source: Oral (11/16 0532) BP: 104/68 (11/16 0532) Pulse Rate: 81 (11/16 0532)  Labs: Recent Labs    11/27/16 1150 11/27/16 1357 11/27/16 1515 11/27/16 2130 11/28/16 0550  HGB 8.9* 8.6*  --   --  7.5*  HCT 28.3* 27.6*  --   --  24.0*  PLT 199 194  --   --  177  LABPROT 15.2  --  15.4*  --   --   INR 1.21  --  1.23  --   --   HEPARINUNFRC  --   --   --   --  <0.10*  CREATININE  --  0.88  --   --   --   TROPONINI  --   --   --  <0.03  --     Estimated Creatinine Clearance: 87.9 mL/min (by C-G formula based on SCr of 0.88 mg/dL).   Medical History: Past Medical History:  Diagnosis Date  . Allergy   . Bladder tumor   . Cataract   . CHF (congestive heart failure) ([place])   . Chronic kidney disease   . CVA (cerebral infarction)    2008  . Hyperlipidemia   . Hypertension     Medications:  Medications Prior to Admission  Medication Sig Dispense Refill Last Dose  . acetaminophen (TYLENOL) 650 MG CR tablet Take 650 mg every 8 (eight) hours as needed by mouth (for pain or fever ("for 48 hours")).   PRN at PRN  . Cholecalciferol (VITAMIN D-3) 1000 units CAPS Take 2,000 Units daily by mouth.   11/27/2016 at 1000  . docusate sodium (COLACE) 100 MG capsule Take 100 mg by mouth 2 (two) times daily. For constipation   11/27/2016 at 0900  . ferrous sulfate 325 (65 FE) MG tablet Take 325 mg 2 (two) times daily with a meal by mouth.   11/27/2016 at 0900  . hydrochlorothiazide (HYDRODIURIL) 25 MG tablet Take 25 mg by mouth daily.   11/27/2016 at 1000  . ibuprofen (ADVIL,MOTRIN) 400 MG tablet Take 400 mg every 6 (six) hours as  needed by mouth (for pain).    PRN at PRN  . lisinopril (PRINIVIL,ZESTRIL) 10 MG tablet Take 10 mg by mouth daily.   11/27/2016 at 1000  . loratadine (CLARITIN) 10 MG tablet Take 10 mg daily by mouth.   11/27/2016 at 1000  . omeprazole (PRILOSEC) 20 MG capsule Take 1 capsule by mouth daily.   11/27/2016 at 0630  . simvastatin (ZOCOR) 40 MG tablet Take 40 mg by mouth at bedtime.   11/26/2016 at 2100    Assessment: 66 year old male from [place] home who presented today for IR guided neck biopsy when it was discovered he had atrial fibrillation with RVR. Pharmacy was consulted to start IV heparin. CHADsVASc =5.   Hgb trended down this morning from 8.6 to 7.6. Platelets are stable. First heparin level since infusion was started ~2200 came back undetectable (<0.10). No signs/symptoms of bleeding noted in chart. Confirmed with nursing, no issues or interruptions to the infusion.   Goal of Therapy:  Heparin level 0.3-0.7 units/ml Monitor platelets by anticoagulation protocol: Yes  Plan:  IV Heparin bolus of 2500 units x1.  Increase heparin drip to 1550 units/hr.  Re-check heparin level in 6 hours.  Daily heparin level and CBC while on therapy.   Doylene Canard, PharmD Clinical Pharmacist  Pager: [phone] Clinical Phone for 11/28/2016 until 3:30pm: [phone] If after 3:30pm, please call main pharmacy at [phone] 11/28/2016,7:47 AM

## 2016-11-29 ENCOUNTER — Inpatient Hospital Stay (HOSPITAL_COMMUNITY): Payer: Medicare Other

## 2016-11-29 DIAGNOSIS — M7989 Other specified soft tissue disorders: Secondary | ICD-10-CM

## 2016-11-29 DIAGNOSIS — I10 Essential (primary) hypertension: Secondary | ICD-10-CM

## 2016-11-29 DIAGNOSIS — I361 Nonrheumatic tricuspid (valve) insufficiency: Secondary | ICD-10-CM

## 2016-11-29 DIAGNOSIS — R591 Generalized enlarged lymph nodes: Secondary | ICD-10-CM

## 2016-11-29 LAB — CBC
HCT: 25.6 % — ABNORMAL LOW (ref 39.0–52.0)
Hemoglobin: 7.8 g/dL — ABNORMAL LOW (ref 13.0–17.0)
MCH: 27 pg (ref 26.0–34.0)
MCHC: 30.5 g/dL (ref 30.0–36.0)
MCV: 88.6 fL (ref 78.0–100.0)
PLATELETS: 193 10*3/uL (ref 150–400)
RBC: 2.89 MIL/uL — AB (ref 4.22–5.81)
RDW: 16.8 % — ABNORMAL HIGH (ref 11.5–15.5)
WBC: 4.2 10*3/uL (ref 4.0–10.5)

## 2016-11-29 LAB — ECHOCARDIOGRAM COMPLETE
Height: 71 in
Weight: 3040.58 oz

## 2016-11-29 MED ORDER — METOPROLOL TARTRATE 25 MG PO TABS
25.0000 mg | ORAL_TABLET | Freq: Four times a day (QID) | ORAL | Status: DC
  Administered 2016-11-29 – 2016-11-30 (×5): 25 mg via ORAL
  Filled 2016-11-29 (×5): qty 1

## 2016-11-29 MED ORDER — METOPROLOL TARTRATE 12.5 MG HALF TABLET
12.5000 mg | ORAL_TABLET | Freq: Once | ORAL | Status: AC
  Administered 2016-11-29: 12.5 mg via ORAL
  Filled 2016-11-29: qty 1

## 2016-11-29 MED ORDER — METOPROLOL TARTRATE 25 MG PO TABS
25.0000 mg | ORAL_TABLET | Freq: Once | ORAL | Status: AC
  Administered 2016-11-29: 25 mg via ORAL
  Filled 2016-11-29: qty 1

## 2016-11-29 NOTE — Progress Notes (Addendum)
2: 40 pm Patient is refusing RUE venous duplex at this time. Wishes to speak with MD first regarding need for this test. Patient states his arm has been swollen since 1990s. Will let RN know.   2:50 pm Spoke with RN, she let patient know that this was part of the workup. He agreed to have the test.   3:10pm RUE venous duplex completed. Prelim: negative for DVT and SVT. Landry Mellow, RDMS, RVT

## 2016-11-29 NOTE — Progress Notes (Signed)
  Echocardiogram 2D Echocardiogram has been performed.  [name] [name] T Larin Depaoli 11/29/2016, 8:59 AM

## 2016-11-29 NOTE — Progress Notes (Signed)
DAILY PROGRESS NOTE   Patient Name: [name] [name] Date of Encounter: 11/29/2016  Chief Complaint   No complaints, not aware of a-fib  Patient Profile   [name] [name] is a 66 y.o. male with past medical history of HTN, HLD, and prior CVA (occurring in 2008) who is being seen today for the evaluation of atrial fibrillation at the request of Dr. Teryl Lucy.   Subjective   Remains in a-fib with RVR - despite metoprolol. He was on 12.5 mg q6 hrs - increased to 25 mg q6 hrs today. Will need to monitor BP closely.  Objective   Vitals:   11/29/16 0522 11/29/16 0532 11/29/16 0812 11/29/16 1056  BP: 102/64  109/83 111/63  Pulse: (!) 102   (!) 129  Resp: (!) 24     Temp: (!) 97.5 F (36.4 C)  98 F (36.7 C)   TempSrc: Oral  Oral   SpO2: 99%  94%   Weight:  190 lb 0.6 oz (86.2 kg)    Height:        Intake/Output Summary (Last 24 hours) at 11/29/2016 1210 Last data filed at 11/29/2016 1100 Gross per 24 hour  Intake 1535.83 ml  Output 200 ml  Net 1335.83 ml   Filed Weights   11/27/16 2108 11/28/16 0532 11/29/16 0532  Weight: 189 lb 9.5 oz (86 kg) 187 lb 6.3 oz (85 kg) 190 lb 0.6 oz (86.2 kg)    Physical Exam   General appearance: alert, no distress and wide-set eyes, flattened nasal bridge Lungs: diminished breath sounds bilaterally Heart: irregularly irregular rhythm and tachycardic Extremities: extremities normal, atraumatic, no cyanosis or edema Neurologic: Mental status: slowed mentation  Inpatient Medications    Scheduled Meds: . aspirin  81 mg Oral Daily  . Chlorhexidine Gluconate Cloth  6 each Topical Q0600  . ferrous sulfate  325 mg Oral BID WC  . loratadine  10 mg Oral Daily  . metoprolol tartrate  25 mg Oral Q6H  . mupirocin ointment  1 application Nasal BID  . pantoprazole  40 mg Oral Daily  . simvastatin  40 mg Oral QHS    Continuous Infusions:   PRN Meds: acetaminophen, ondansetron (ZOFRAN) IV   Labs   Results for orders placed or performed  during the hospital encounter of 11/27/16 (from the past 48 hour(s))  Basic metabolic panel     Status: Abnormal   Collection Time: 11/27/16  1:57 PM  Result Value Ref Range   Sodium 135 135 - 145 mmol/L   Potassium 3.8 3.5 - 5.1 mmol/L   Chloride 99 (L) 101 - 111 mmol/L   CO2 23 22 - 32 mmol/L   Glucose, Bld 89 65 - 99 mg/dL   BUN 11 6 - 20 mg/dL   Creatinine, Ser 0.88 0.61 - 1.24 mg/dL   Calcium 8.6 (L) 8.9 - 10.3 mg/dL   GFR calc non Af Amer >60 >60 mL/min   GFR calc Af Amer >60 >60 mL/min    Comment: (NOTE) The eGFR has been calculated using the CKD EPI equation. This calculation has not been validated in all clinical situations. eGFR's persistently <60 mL/min signify possible Chronic Kidney Disease.    Anion gap 13 5 - 15  CBC     Status: Abnormal   Collection Time: 11/27/16  1:57 PM  Result Value Ref Range   WBC 4.8 4.0 - 10.5 K/uL   RBC 3.15 (L) 4.22 - 5.81 MIL/uL   Hemoglobin 8.6 (L) 13.0 - 17.0  g/dL   HCT 27.6 (L) 39.0 - 52.0 %   MCV 87.6 78.0 - 100.0 fL   MCH 27.3 26.0 - 34.0 pg   MCHC 31.2 30.0 - 36.0 g/dL   RDW 16.3 (H) 11.5 - 15.5 %   Platelets 194 150 - 400 K/uL  TSH     Status: None   Collection Time: 11/27/16  3:15 PM  Result Value Ref Range   TSH 2.881 0.350 - 4.500 uIU/mL    Comment: Performed by a 3rd Generation assay with a functional sensitivity of <=0.01 uIU/mL.  Protime-INR     Status: Abnormal   Collection Time: 11/27/16  3:15 PM  Result Value Ref Range   Prothrombin Time 15.4 (H) 11.4 - 15.2 seconds   INR 1.23   Magnesium     Status: None   Collection Time: 11/27/16  3:15 PM  Result Value Ref Range   Magnesium 2.0 1.7 - 2.4 mg/dL  Troponin I     Status: None   Collection Time: 11/27/16  9:30 PM  Result Value Ref Range   Troponin I <0.03 <0.03 ng/mL  MRSA PCR Screening     Status: Abnormal   Collection Time: 11/27/16  9:53 PM  Result Value Ref Range   MRSA by PCR POSITIVE (A) NEGATIVE    Comment:        The GeneXpert MRSA Assay  (FDA approved for NASAL specimens only), is one component of a comprehensive MRSA colonization surveillance program. It is not intended to diagnose MRSA infection nor to guide or monitor treatment for MRSA infections. RESULT CALLED TO, READ BACK BY AND VERIFIED WITH: RN Madolyn Frieze [id number] MLM   Lipid panel     Status: Abnormal   Collection Time: 11/28/16  5:50 AM  Result Value Ref Range   Cholesterol 92 0 - 200 mg/dL   Triglycerides 82 <150 mg/dL   HDL 11 (L) >40 mg/dL   Total CHOL/HDL Ratio 8.4 RATIO   VLDL 16 0 - 40 mg/dL   LDL Cholesterol 65 0 - 99 mg/dL    Comment:        Total Cholesterol/HDL:CHD Risk Coronary Heart Disease Risk Table                     Men   Women  1/2 Average Risk   3.4   3.3  Average Risk       5.0   4.4  2 X Average Risk   9.6   7.1  3 X Average Risk  23.4   11.0        Use the calculated Patient Ratio above and the CHD Risk Table to determine the patient's CHD Risk.        ATP III CLASSIFICATION (LDL):  <100     mg/dL   Optimal  100-129  mg/dL   Near or Above                    Optimal  130-159  mg/dL   Borderline  160-189  mg/dL   High  >190     mg/dL   Very High   Heparin level (unfractionated)     Status: Abnormal   Collection Time: 11/28/16  5:50 AM  Result Value Ref Range   Heparin Unfractionated <0.10 (L) 0.30 - 0.70 IU/mL    Comment:        IF HEPARIN RESULTS ARE BELOW EXPECTED VALUES, AND PATIENT DOSAGE HAS BEEN CONFIRMED, SUGGEST FOLLOW  UP TESTING OF ANTITHROMBIN III LEVELS.   CBC     Status: Abnormal   Collection Time: 11/28/16  5:50 AM  Result Value Ref Range   WBC 3.9 (L) 4.0 - 10.5 K/uL   RBC 2.73 (L) 4.22 - 5.81 MIL/uL   Hemoglobin 7.5 (L) 13.0 - 17.0 g/dL   HCT 24.0 (L) 39.0 - 52.0 %   MCV 87.9 78.0 - 100.0 fL   MCH 27.5 26.0 - 34.0 pg   MCHC 31.3 30.0 - 36.0 g/dL   RDW 16.5 (H) 11.5 - 15.5 %   Platelets 177 150 - 400 K/uL  Folate     Status: None   Collection Time: 11/28/16  2:29 PM  Result Value Ref  Range   Folate 14.0 >5.9 ng/mL  Vitamin B12     Status: None   Collection Time: 11/28/16  2:38 PM  Result Value Ref Range   Vitamin B-12 638 180 - 914 pg/mL    Comment: (NOTE) This assay is not validated for testing neonatal or myeloproliferative syndrome specimens for Vitamin B12 levels.   Iron and TIBC     Status: Abnormal   Collection Time: 11/28/16  2:38 PM  Result Value Ref Range   Iron 15 (L) 45 - 182 ug/dL   TIBC 190 (L) 250 - 450 ug/dL   Saturation Ratios 8 (L) 17.9 - 39.5 %   UIBC 175 ug/dL  Ferritin     Status: Abnormal   Collection Time: 11/28/16  2:38 PM  Result Value Ref Range   Ferritin 392 (H) 24 - 336 ng/mL  Reticulocytes     Status: Abnormal   Collection Time: 11/28/16  2:38 PM  Result Value Ref Range   Retic Ct Pct 3.8 (H) 0.4 - 3.1 %   RBC. 3.03 (L) 4.22 - 5.81 MIL/uL   Retic Count, Absolute 115.1 19.0 - 186.0 K/uL  Urinalysis, Routine w reflex microscopic     Status: Abnormal   Collection Time: 11/28/16  9:53 PM  Result Value Ref Range   Color, Urine AMBER (A) YELLOW    Comment: BIOCHEMICALS MAY BE AFFECTED BY COLOR   APPearance HAZY (A) CLEAR   Specific Gravity, Urine 1.038 (H) 1.005 - 1.030   pH 5.0 5.0 - 8.0   Glucose, UA NEGATIVE NEGATIVE mg/dL   Hgb urine dipstick SMALL (A) NEGATIVE   Bilirubin Urine NEGATIVE NEGATIVE   Ketones, ur NEGATIVE NEGATIVE mg/dL   Protein, ur 30 (A) NEGATIVE mg/dL   Nitrite NEGATIVE NEGATIVE   Leukocytes, UA NEGATIVE NEGATIVE   RBC / HPF 0-5 0 - 5 RBC/hpf   WBC, UA 0-5 0 - 5 WBC/hpf   Bacteria, UA NONE SEEN NONE SEEN   Squamous Epithelial / LPF 0-5 (A) NONE SEEN   Mucus PRESENT    Amorphous Crystal PRESENT   CBC     Status: Abnormal   Collection Time: 11/29/16  5:17 AM  Result Value Ref Range   WBC 4.2 4.0 - 10.5 K/uL   RBC 2.89 (L) 4.22 - 5.81 MIL/uL   Hemoglobin 7.8 (L) 13.0 - 17.0 g/dL   HCT 25.6 (L) 39.0 - 52.0 %   MCV 88.6 78.0 - 100.0 fL   MCH 27.0 26.0 - 34.0 pg   MCHC 30.5 30.0 - 36.0 g/dL   RDW 16.8  (H) 11.5 - 15.5 %   Platelets 193 150 - 400 K/uL    ECG   N/A  Telemetry   A-fib with RVR - Personally Reviewed  Radiology  Ct Angio Chest Pe W And/or Wo Contrast  Result Date: 11/27/2016 CLINICAL DATA:  Tachycardia. Suspect pulmonary embolism. History of hypertension, ex smoker, bladder tumor. Scheduled for neck biopsy today. EXAM: CT ANGIOGRAPHY CHEST WITH CONTRAST TECHNIQUE: Multidetector CT imaging of the chest was performed using the standard protocol during bolus administration of intravenous contrast. Multiplanar CT image reconstructions and MIPs were obtained to evaluate the vascular anatomy. CONTRAST:  130m ISOVUE-370 IOPAMIDOL (ISOVUE-370) INJECTION 76% COMPARISON:  CT neck November 21, 2016 and chest radiograph November 27, 2016 and CT abdomen and pelvis June 22, 2012 FINDINGS: CARDIOVASCULAR: Adequate contrast opacification of the pulmonary artery's. Main pulmonary artery is not enlarged. No pulmonary arterial filling defects to the level of the segmental branches, distal branches obscured by respiratory motion. Heart is upper limits of normal in size. Mild coronary artery calcifications. Small pericardial effusion. Thoracic aorta is normal course and caliber, unremarkable. MEDIASTINUM/NODES: Greater than expected number of mildly enlarged mediastinal and hilar lymph nodes. Anterior mediastinal fat stranding. Bulky RIGHT greater than LEFT axillary lymphadenopathy, 12.9 x 6.7 cm RIGHT axillary nodal conglomeration. Bulky RIGHT grand LEFT supraclavicular lymphadenopathy. LUNGS/PLEURA: Tracheobronchial tree is patent, no pneumothorax. No pleural effusions, focal consolidations, pulmonary nodules or masses. UPPER ABDOMEN: New splenomegaly. 6.7 cm cyst (5 Hounsfield units) LEFT renal fossa, larger than prior CT. Stable 3 cm cyst (3 Hounsfield units) RIGHT lobe of the liver. Partially imaged probable portal caval lymphadenopathy. MUSCULOSKELETAL: Nonacute. RIGHT biceps intramuscular lipoma  versus atrophy. Multilevel scattered Schmorl's nodes. Review of the MIP images confirms the above findings. IMPRESSION: 1. No acute pulmonary embolism. 2. Bulky supraclavicular, axillary and possibly portal caval lymphadenopathy. 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 3. Borderline cardiomegaly.  No acute pulmonary process. Aortic Atherosclerosis (ICD10-I70.0). Electronically Signed   By: CElon AlasM.D.   On: 11/27/2016 17:13   Dg Chest Port 1 View  Result Date: 11/27/2016 CLINICAL DATA:  Atrial fibrillation EXAM: PORTABLE CHEST 1 VIEW COMPARISON:  None. FINDINGS: Cardiac silhouette is upper limits of normal. Mild aortic atherosclerotic calcification. No focal airspace consolidation or pulmonary edema. No pneumothorax or sizable pleural effusion. IMPRESSION: No active disease. Aortic Atherosclerosis (ICD10-I70.0). Electronically Signed   By: KUlyses JarredM.D.   On: 11/27/2016 15:17   [place] Biopsy (lymph Nodes)  Result Date: 11/28/2016 INDICATION: Bulky cervical axillary lymphadenopathy. Please perform ultrasound-guided biopsy for tissue diagnostic purposes. EXAM: ULTRASOUND-GUIDED RIGHT CERVICAL LYMPH NODE BIOPSY COMPARISON:  Neck CT - 11/21/2016; chest CT - 11/17/2016 MEDICATIONS: None ANESTHESIA/SEDATION: Moderate (conscious) sedation was employed during this procedure. A total of Versed 2 mg and Fentanyl 75 mcg was administered intravenously. Moderate Sedation Time: 10 minutes. The patient's level of consciousness and vital signs were monitored continuously by radiology nursing throughout the procedure under my direct supervision. COMPLICATIONS: None immediate. TECHNIQUE: Informed written consent was obtained from the patient after a discussion of the risks, benefits and alternatives to treatment. Questions regarding the procedure were encouraged and answered. Initial ultrasound scanning demonstrated multiple enlarged cervical lymph nodes. A  dominant approximately 3.2 x 1.6 cm lymph node within the superficial aspect of the mid right lateral neck was targeted for biopsy given lymph node location and sonographic window. An ultrasound image was saved for documentation purposes. The procedure was planned. A timeout was performed prior to the initiation of the procedure. The operative was prepped and draped in the usual sterile fashion, and a sterile drape was applied covering the operative field. A timeout was performed prior to the initiation of the  procedure. Local anesthesia was provided with 1% lidocaine with epinephrine. Under direct ultrasound guidance, an 18 gauge core needle device was utilized to obtain to obtain 6 core needle biopsies of the dominant right cervical lymph node. The samples were placed in saline and submitted to pathology. The needle was removed and hemostasis was achieved with manual compression. Post procedure scan was negative for significant hematoma. A dressing was placed. The patient tolerated the procedure well without immediate postprocedural complication. IMPRESSION: Technically successful ultrasound guided biopsy of dominant right cervical lymph node. Electronically Signed   By: Sandi Mariscal M.D.   On: 11/28/2016 13:48    Cardiac Studies   LV EF: 55% -   60%  ------------------------------------------------------------------- Indications:      Atrial fibrillation - chronic 427.31.  ------------------------------------------------------------------- History:   PMH:   Atrial fibrillation.  Risk factors: Hypertension. Dyslipidemia.  ------------------------------------------------------------------- Study Conclusions  - Left ventricle: Wall thickness was increased in a pattern of   moderate LVH. Systolic function was normal. The estimated   ejection fraction was in the range of 55% to 60%. Wall motion was   normal; there were no regional wall motion abnormalities. The   study is not technically  sufficient to allow evaluation of LV   diastolic function. - Aortic valve: Trileaflet. Sclerosis without stenosis. There was   trivial regurgitation. - Mitral valve: Mildly thickened leaflets . There was trivial   regurgitation. - Left atrium: Moderately dilated. - Right ventricle: The cavity size was mildly dilated. Low normal   systolic function. - Right atrium: Moderately dilated. - Tricuspid valve: There was mild regurgitation. - Pulmonary arteries: PA peak pressure: 35 mm Hg (S). - Inferior vena cava: The vessel was normal in size. The   respirophasic diameter changes were in the normal range (>= 50%),   consistent with normal central venous pressure. - Pericardium, extracardiac: A trivial pericardial effusion was   identified posterior to the heart. Features were not consistent   with tamponade physiology.  Impressions:  - LVEF 55-60%, moderate LVH, normal wall motion, aortic valve   sclerosis with trivial AI, moderate biatrial enlargement, mild   RVE with low normal RV systolic function, mild TR, RVSP 35 mmHg,   normal IVC, trivial posterior pericardial effusion.  Assessment   1. Principal Problem: 2.   Atrial fibrillation with RVR ([place]) 3. Active Problems: 4.   Mild intellectual disability 5.   Essential hypertension 6.   Normocytic anemia 7.   Lymphadenopathy 8.   Plan   1. Rate remains not well-controlled - BB increased today. Will need to monitor BP closely to see if it is tolerated. Echo shows normal LVEF, moderate biatrial enlargement, trivial posterior pericardial effusion, no tamponade.   Time Spent Directly with Patient:  I have spent a total of 25 minutes with the patient reviewing hospital notes, telemetry, EKGs, labs and examining the patient as well as establishing an assessment and plan that was discussed personally with the patient. > 50% of time was spent in direct patient care.  Length of Stay:  LOS: 2 days   Pixie Casino, MD, [facility], [place] Director of the [facility]  [facility] Attending Cardiologist  Direct Dial: [phone]  Fax: [phone]  Website:  [email or web address] 11/29/2016, 12:10 PM

## 2016-11-29 NOTE — Progress Notes (Addendum)
Patient's heart rate in 130's Hershal Coria notified.  Verbal order for 12.5 mg po Metoprolol for one dose received.

## 2016-11-29 NOTE — Progress Notes (Signed)
Patient's hr in 140's.  Patient on side of bed eating. Scheduled metoprolol 25mg  given.  Dr Rhae Hammock notified, he stated to give a few hours and see if metoprolol works. Will continue to monitor.

## 2016-11-29 NOTE — Progress Notes (Signed)
PROGRESS NOTE    [name] [name]  UMP:[id number] DOB: [birth year] DOA: 11/27/2016 PCP: Wenda Low, MD   Brief Narrative: [name] [name] is a 66 y.o. male with medical history significant of HTN, HLD, CVA, CKD, and CHF presenting with afib. He was admitted for atrial fibrillation with RVR while attempting to undergo a core biopsy.     Assessment & Plan:   Principal Problem:   Atrial fibrillation with RVR (HCC) Active Problems:   Mild intellectual disability   Essential hypertension   Normocytic anemia   Lymphadenopathy   Atrial fibrillation with RVR Patient started on Cardizem drip and heparin drip which have been discontinued. Heart rate significantly elevated into the 120s, non-sustained, but frequently. -cardiology recommendations: metoprolol -echocardiogram pending  Anemia of chronic disease Patient with hematuria, hematochezia and finds blood in his mouth at times. Unknown etiology. INR normal. Hemoglobin slightly down today. Chart review shows problem of bladder neoplasm and history of rectal bleeding. Colonoscopy three years ago significant for a polyp in sigmoid colon and colonic polyposis. Flexible sigmoidoscopy 2 years ago 5 cm pedunculated polyp. Pathology included results of tubular adenoma and lipoma. 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 Hemoglobin stable. -FOBT pending  Left cervical lymphadenopathy IR performed biopsy today (11/28/16). PCP can follow-up results  Essential hypertension Normotensive. -metoprolol per cardiology  History of CVA Hyperlipidemia -continue Simvastatin  CHF Listed as problem. No available echocardiogram -echocardiogram pending  Constipation -Miralax  Mild intellectual disability  Fever Unknown source. No leukocytosis. Patient asymptomatic.  -blood cultures pending -hold off on starting antibiotics   DVT prophylaxis: SCDs Code Status: Full code Family Communication:  None at bedside Disposition Plan: Discharge to SNF when medically stable   Consultants:   Cardiology  Interventional radiology  Procedures:   Lymph node biopsy (11/16)  Antimicrobials:  None    Subjective: No chest pain or palpitations. No bowel movement. Passing gas. Febrile overnight. No chills, cough, sneezing, dysuria, abdominal pain. Fever improved with Tylenol.  Objective: Vitals:   11/29/16 0522 11/29/16 0532 11/29/16 0812 11/29/16 1056  BP: 102/64  109/83 111/63  Pulse: (!) 102   (!) 129  Resp: (!) 24     Temp: (!) 97.5 F (36.4 C)  98 F (36.7 C)   TempSrc: Oral  Oral   SpO2: 99%  94%   Weight:  86.2 kg (190 lb 0.6 oz)    Height:        Intake/Output Summary (Last 24 hours) at 11/29/2016 1127 Last data filed at 11/29/2016 1100 Gross per 24 hour  Intake 1535.83 ml  Output 200 ml  Net 1335.83 ml   Filed Weights   11/27/16 2108 11/28/16 0532 11/29/16 0532  Weight: 86 kg (189 lb 9.5 oz) 85 kg (187 lb 6.3 oz) 86.2 kg (190 lb 0.6 oz)    Examination:  General exam: Appears calm and comfortable HEENT: right cervical neck adenopathy without tenderness Respiratory system: Clear to auscultation bilaterally. Unlabored work of breathing. No wheezing or rales. Cardiovascular system: S1 & S2 heard, Irregular rhythm, fast rate. No murmurs, rubs, gallops or clicks. Gastrointestinal system: Abdomen is nondistended, soft and nontender. No organomegaly or masses felt. Normal bowel sounds heard. Central nervous system: Alert and oriented. No focal neurological deficits. Extremities: No edema. No calf tenderness. Right arm is significantly enlarged compared to left with no tenderness Skin: No cyanosis. No rashes Psychiatry: Judgement and insight appear normal. Mood & affect appropriate.     Data Reviewed: I have personally  reviewed following labs and imaging studies  CBC: Recent Labs  Lab 11/27/16 1150 11/27/16 1357 11/28/16 0550 11/29/16 0517  WBC 4.6 4.8  3.9* 4.2  HGB 8.9* 8.6* 7.5* 7.8*  HCT 28.3* 27.6* 24.0* 25.6*  MCV 87.9 87.6 87.9 88.6  PLT 199 194 177 527   Basic Metabolic Panel: Recent Labs  Lab 11/27/16 1357 11/27/16 1515  NA 135  --   K 3.8  --   CL 99*  --   CO2 23  --   GLUCOSE 89  --   BUN 11  --   CREATININE 0.88  --   CALCIUM 8.6*  --   MG  --  2.0   GFR: Estimated Creatinine Clearance: 87.9 mL/min (by C-G formula based on SCr of 0.88 mg/dL). Liver Function Tests: No results for input(s): AST, ALT, ALKPHOS, BILITOT, PROT, ALBUMIN in the last 168 hours. No results for input(s): LIPASE, AMYLASE in the last 168 hours. No results for input(s): AMMONIA in the last 168 hours. Coagulation Profile: Recent Labs  Lab 11/27/16 1150 11/27/16 1515  INR 1.21 1.23   Cardiac Enzymes: Recent Labs  Lab 11/27/16 2130  TROPONINI <0.03   BNP (last 3 results) No results for input(s): PROBNP in the last 8760 hours. HbA1C: No results for input(s): HGBA1C in the last 72 hours. CBG: No results for input(s): GLUCAP in the last 168 hours. Lipid Profile: Recent Labs    11/28/16 0550  CHOL 92  HDL 11*  LDLCALC 65  TRIG 82  CHOLHDL 8.4   Thyroid Function Tests: Recent Labs    11/27/16 1515  TSH 2.881   Anemia Panel: Recent Labs    11/28/16 1429 11/28/16 1438  VITAMINB12  --  638  FOLATE 14.0  --   FERRITIN  --  392*  TIBC  --  190*  IRON  --  15*  RETICCTPCT  --  3.8*   Sepsis Labs: No results for input(s): PROCALCITON, LATICACIDVEN in the last 168 hours.  Recent Results (from the past 240 hour(s))  MRSA PCR Screening     Status: Abnormal   Collection Time: 11/27/16  9:53 PM  Result Value Ref Range Status   MRSA by PCR POSITIVE (A) NEGATIVE Final    Comment:        The GeneXpert MRSA Assay (FDA approved for NASAL specimens only), is one component of a comprehensive MRSA colonization surveillance program. It is not intended to diagnose MRSA infection nor to guide or monitor treatment for MRSA  infections. RESULT CALLED TO, READ BACK BY AND VERIFIED WITH: RN Madolyn Frieze [id number] MLM          Radiology Studies: Ct Angio Chest Pe W And/or Wo Contrast  Result Date: 11/27/2016 CLINICAL DATA:  Tachycardia. Suspect pulmonary embolism. History of hypertension, ex smoker, bladder tumor. Scheduled for neck biopsy today. EXAM: CT ANGIOGRAPHY CHEST WITH CONTRAST TECHNIQUE: Multidetector CT imaging of the chest was performed using the standard protocol during bolus administration of intravenous contrast. Multiplanar CT image reconstructions and MIPs were obtained to evaluate the vascular anatomy. CONTRAST:  177mL ISOVUE-370 IOPAMIDOL (ISOVUE-370) INJECTION 76% COMPARISON:  CT neck November 21, 2016 and chest radiograph November 27, 2016 and CT abdomen and pelvis June 22, 2012 FINDINGS: CARDIOVASCULAR: Adequate contrast opacification of the pulmonary artery's. Main pulmonary artery is not enlarged. No pulmonary arterial filling defects to the level of the segmental branches, distal branches obscured by respiratory motion. Heart is upper limits of normal in size. Mild coronary artery  calcifications. Small pericardial effusion. Thoracic aorta is normal course and caliber, unremarkable. MEDIASTINUM/NODES: Greater than expected number of mildly enlarged mediastinal and hilar lymph nodes. Anterior mediastinal fat stranding. Bulky RIGHT greater than LEFT axillary lymphadenopathy, 12.9 x 6.7 cm RIGHT axillary nodal conglomeration. Bulky RIGHT grand LEFT supraclavicular lymphadenopathy. LUNGS/PLEURA: Tracheobronchial tree is patent, no pneumothorax. No pleural effusions, focal consolidations, pulmonary nodules or masses. UPPER ABDOMEN: New splenomegaly. 6.7 cm cyst (5 Hounsfield units) LEFT renal fossa, larger than prior CT. Stable 3 cm cyst (3 Hounsfield units) RIGHT lobe of the liver. Partially imaged probable portal caval lymphadenopathy. MUSCULOSKELETAL: Nonacute. RIGHT biceps intramuscular lipoma versus  atrophy. Multilevel scattered Schmorl's nodes. Review of the MIP images confirms the above findings. IMPRESSION: 1. No acute pulmonary embolism. 2. Bulky supraclavicular, axillary and possibly portal caval lymphadenopathy. 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 3. Borderline cardiomegaly.  No acute pulmonary process. Aortic Atherosclerosis (ICD10-I70.0). Electronically Signed   By: Elon Alas M.D.   On: 11/27/2016 17:13   Dg Chest Port 1 View  Result Date: 11/27/2016 CLINICAL DATA:  Atrial fibrillation EXAM: PORTABLE CHEST 1 VIEW COMPARISON:  None. FINDINGS: Cardiac silhouette is upper limits of normal. Mild aortic atherosclerotic calcification. No focal airspace consolidation or pulmonary edema. No pneumothorax or sizable pleural effusion. IMPRESSION: No active disease. Aortic Atherosclerosis (ICD10-I70.0). Electronically Signed   By: Ulyses Jarred M.D.   On: 11/27/2016 15:17   [place] Core Biopsy (lymph Nodes)  Result Date: 11/28/2016 INDICATION: Bulky cervical axillary lymphadenopathy. Please perform ultrasound-guided biopsy for tissue diagnostic purposes. EXAM: ULTRASOUND-GUIDED RIGHT CERVICAL LYMPH NODE BIOPSY COMPARISON:  Neck CT - 11/21/2016; chest CT - 11/17/2016 MEDICATIONS: None ANESTHESIA/SEDATION: Moderate (conscious) sedation was employed during this procedure. A total of Versed 2 mg and Fentanyl 75 mcg was administered intravenously. Moderate Sedation Time: 10 minutes. The patient's level of consciousness and vital signs were monitored continuously by radiology nursing throughout the procedure under my direct supervision. COMPLICATIONS: None immediate. TECHNIQUE: Informed written consent was obtained from the patient after a discussion of the risks, benefits and alternatives to treatment. Questions regarding the procedure were encouraged and answered. Initial ultrasound scanning demonstrated multiple enlarged cervical lymph nodes. A dominant  approximately 3.2 x 1.6 cm lymph node within the superficial aspect of the mid right lateral neck was targeted for biopsy given lymph node location and sonographic window. An ultrasound image was saved for documentation purposes. The procedure was planned. A timeout was performed prior to the initiation of the procedure. The operative was prepped and draped in the usual sterile fashion, and a sterile drape was applied covering the operative field. A timeout was performed prior to the initiation of the procedure. Local anesthesia was provided with 1% lidocaine with epinephrine. Under direct ultrasound guidance, an 18 gauge core needle device was utilized to obtain to obtain 6 core needle biopsies of the dominant right cervical lymph node. The samples were placed in saline and submitted to pathology. The needle was removed and hemostasis was achieved with manual compression. Post procedure scan was negative for significant hematoma. A dressing was placed. The patient tolerated the procedure well without immediate postprocedural complication. IMPRESSION: Technically successful ultrasound guided biopsy of dominant right cervical lymph node. Electronically Signed   By: Sandi Mariscal M.D.   On: 11/28/2016 13:48        Scheduled Meds: . aspirin  81 mg Oral Daily  . Chlorhexidine Gluconate Cloth  6 each Topical Q0600  . ferrous sulfate  325 mg Oral BID WC  .  loratadine  10 mg Oral Daily  . metoprolol tartrate  25 mg Oral Q6H  . mupirocin ointment  1 application Nasal BID  . pantoprazole  40 mg Oral Daily  . simvastatin  40 mg Oral QHS   Continuous Infusions:   LOS: 2 days     Cordelia Poche, MD [facility] 11/29/2016, 11:27 AM Pager: [phone]  If 7PM-7AM, please contact night-coverage [email or web address] Password [email or web address] 11/29/2016, 11:27 AM

## 2016-11-30 ENCOUNTER — Inpatient Hospital Stay (HOSPITAL_COMMUNITY): Payer: Medicare Other

## 2016-11-30 DIAGNOSIS — R310 Gross hematuria: Secondary | ICD-10-CM

## 2016-11-30 DIAGNOSIS — R590 Localized enlarged lymph nodes: Secondary | ICD-10-CM

## 2016-11-30 DIAGNOSIS — D500 Iron deficiency anemia secondary to blood loss (chronic): Secondary | ICD-10-CM

## 2016-11-30 LAB — CBC
HEMATOCRIT: 26.3 % — AB (ref 39.0–52.0)
Hemoglobin: 8 g/dL — ABNORMAL LOW (ref 13.0–17.0)
MCH: 26.8 pg (ref 26.0–34.0)
MCHC: 30.4 g/dL (ref 30.0–36.0)
MCV: 88 fL (ref 78.0–100.0)
Platelets: 177 10*3/uL (ref 150–400)
RBC: 2.99 MIL/uL — ABNORMAL LOW (ref 4.22–5.81)
RDW: 16.7 % — AB (ref 11.5–15.5)
WBC: 4.5 10*3/uL (ref 4.0–10.5)

## 2016-11-30 LAB — COMPREHENSIVE METABOLIC PANEL
ALK PHOS: 69 U/L (ref 38–126)
ALT: 10 U/L — ABNORMAL LOW (ref 17–63)
ANION GAP: 10 (ref 5–15)
AST: 31 U/L (ref 15–41)
Albumin: 2.1 g/dL — ABNORMAL LOW (ref 3.5–5.0)
BUN: 12 mg/dL (ref 6–20)
CO2: 23 mmol/L (ref 22–32)
Calcium: 8 mg/dL — ABNORMAL LOW (ref 8.9–10.3)
Chloride: 100 mmol/L — ABNORMAL LOW (ref 101–111)
Creatinine, Ser: 0.72 mg/dL (ref 0.61–1.24)
Glucose, Bld: 132 mg/dL — ABNORMAL HIGH (ref 65–99)
Potassium: 3.5 mmol/L (ref 3.5–5.1)
SODIUM: 133 mmol/L — AB (ref 135–145)
TOTAL PROTEIN: 5.4 g/dL — AB (ref 6.5–8.1)
Total Bilirubin: 1.5 mg/dL — ABNORMAL HIGH (ref 0.3–1.2)

## 2016-11-30 LAB — TROPONIN I

## 2016-11-30 LAB — DIRECT ANTIGLOBULIN TEST (NOT AT ARMC)
DAT, COMPLEMENT: NEGATIVE
DAT, IGG: NEGATIVE

## 2016-11-30 LAB — LACTATE DEHYDROGENASE: LDH: 217 U/L — AB (ref 98–192)

## 2016-11-30 LAB — SEDIMENTATION RATE: SED RATE: 65 mm/h — AB (ref 0–16)

## 2016-11-30 LAB — HEPARIN LEVEL (UNFRACTIONATED)

## 2016-11-30 MED ORDER — SODIUM CHLORIDE 0.9 % IV SOLN
INTRAVENOUS | Status: AC
  Administered 2016-11-30: 21:00:00 via INTRAVENOUS

## 2016-11-30 MED ORDER — TRAMADOL HCL 50 MG PO TABS
50.0000 mg | ORAL_TABLET | Freq: Once | ORAL | Status: AC
Start: 2016-11-30 — End: 2016-11-30
  Administered 2016-11-30: 50 mg via ORAL
  Filled 2016-11-30: qty 1

## 2016-11-30 MED ORDER — HEPARIN (PORCINE) IN NACL 100-0.45 UNIT/ML-% IJ SOLN
2050.0000 [IU]/h | INTRAMUSCULAR | Status: DC
  Administered 2016-11-30: 1200 [IU]/h via INTRAVENOUS
  Administered 2016-12-01: 1500 [IU]/h via INTRAVENOUS
  Administered 2016-12-01: 2050 [IU]/h via INTRAVENOUS
  Filled 2016-11-30 (×3): qty 250

## 2016-11-30 MED ORDER — METOPROLOL TARTRATE 50 MG PO TABS
50.0000 mg | ORAL_TABLET | Freq: Two times a day (BID) | ORAL | Status: DC
  Administered 2016-11-30 – 2016-12-01 (×2): 50 mg via ORAL
  Filled 2016-11-30 (×2): qty 1

## 2016-11-30 MED ORDER — METOPROLOL TARTRATE 5 MG/5ML IV SOLN
5.0000 mg | Freq: Once | INTRAVENOUS | Status: AC
  Administered 2016-11-30: 5 mg via INTRAVENOUS
  Filled 2016-11-30: qty 5

## 2016-11-30 MED ORDER — POLYETHYLENE GLYCOL 3350 17 G PO PACK
17.0000 g | PACK | Freq: Two times a day (BID) | ORAL | Status: DC
  Administered 2016-11-30 – 2016-12-06 (×10): 17 g via ORAL
  Filled 2016-11-30 (×13): qty 1

## 2016-11-30 MED ORDER — DILTIAZEM HCL 100 MG IV SOLR
5.0000 mg/h | INTRAVENOUS | Status: DC
  Filled 2016-11-30: qty 100

## 2016-11-30 NOTE — Progress Notes (Signed)
ADDENDUM: Review of blood film finds no schistocytes, rare spherocyte, some polychromasia, occasional tailed poikylocytes, some ovalocytes and rare red cell fragments; no abnormal lymph nodes; otherwise unremarkable WBC series; no platelet clumps--overall nondiagnostic

## 2016-11-30 NOTE — Progress Notes (Signed)
Patient ID: [name] [name], male   DOB: [birth year], 66 y.o.   MRN: [id number]   Xcover: nurse was concerned regarding ? Hematoma over the right supraclavicular area  Exam: 5x6cm rounded area of enlargement over the right supraclavicular area where biopsy was performed,  I think most of this is probably adenopathy. May have some hematoma as well, slight warmth, no erythema.   A/p Lympadenopathy s/p biopsy Request that day hospitalist reexamine this site and monitor closely

## 2016-11-30 NOTE — Consult Note (Signed)
[facility]  [phone] [phone]     ID: [name] [name] DOB: Apr 20, 1950  [name]#: [id number]  OIZ#:[id number]  Patient Care Team: Wenda Low, MD as PCP - General (Internal Medicine) Chauncey Cruel, MD OTHER MD:  CHIEF COMPLAINT: anemia, adenopathy  CURRENT TREATMENT: workup in progress   HISTORY OF CURRENT ILLNESS: [name] [name] tells me he has had palpable adenopathy for several weeks.  This was brought to medical attention by the nursing home staff and on November 21, 2016 he had a CT of the neck without contrast, which showed bulky adenopathy throughout the neck bilaterally and upper chest, right slightly worse than left.  The right supraclavicular and right axillary region were most prominent.  The patient was set up for right cervical lymph node biopsy 11/27/2016.  He was found to be tachycardic at the time of the procedure, and a CT angiogram was obtained, showing no pulmonary embolus, but again diffuse adenopathy in the supraclavicular axillary and portocaval areas, together with splenomegaly.  The patient was admitted for evaluation and treatment of what proved to be atrial fibrillation.  In addition he was found to be anemic.  Note that his last hemoglobin on record, July 25, 2008, was 16.3.  (The patient is followed by Dr. Deforest Hoyles who uses a different electronic medical record and I do not have access to that today).  On admission his hemoglobin was 8.9 with an MCV of 87.9.  Labs obtained on November 28, 2016 included a ferritin of 392, B12 638, and folate 14.0.  The reticulocyte count was 115.1.  We were consulted for evaluation of his anemia.   The patient's subsequent history is as detailed below.  INTERVAL HISTORY: I met with [name] [name] in his hospital room 11/30/2016.  No family was present   REVIEW OF SYSTEMS: He denies significant weight loss, drenching sweats, or unexplained fatigue.  He has had some fatigue however, likely related to  his anemia, and had a fall episode approximately 3 weeks ago at the nursing home.  He denies significant shortness of breath.  Denies chest pain or pressure.  He is currently moderately constipated.  A detailed review of systems today was otherwise not   PAST MEDICAL HISTORY: Past Medical History:  Diagnosis Date  . Bladder tumor   . Cataract   . CVA (cerebral infarction)    2008  . Hyperlipidemia   . Hypertension     PAST SURGICAL HISTORY: Past Surgical History:  Procedure Laterality Date  . CHOLECYSTECTOMY    . EYE SURGERY    . FLEXIBLE SIGMOIDOSCOPY N/A 11/30/2013   Performed by Inda Castle, MD at [facility]  . HOT HEMOSTASIS (ARGON PLASMA COAGULATION/BICAP) N/A 11/30/2013   Performed by Inda Castle, MD at [facility]  . MOUTH SURGERY      FAMILY HISTORY Family History  Problem Relation Age of Onset  . Colon cancer Father   . Colon cancer Sister 40  . Hypertension Sister   The patient's father died from rectal carcinoma at age 50.  Patient's mother died at age 23 also from cancer but the patient does not know what type.  1 of 2 sisters died at age 58 with rectal cancer.  The patient's brother also apparently died from cancer but again the patient does not know what type  SOCIAL HISTORY:  [name]. Lotter is single and has no children.  He lives at [facility] and rehab, has been there since April 2008 following a  left body stroke.    ADVANCED DIRECTIVES: He believes he has a healthcare power of attorney but does not recall who he may have named.   HEALTH MAINTENANCE: Social History   Tobacco Use  . Smoking status: Former Smoker    Last attempt to quit: 09/15/1985    Years since quitting: 31.2  . Smokeless tobacco: Never Used  . Tobacco comment: Started at age 38, quit in the 1980's  Substance Use Topics  . Alcohol use: No  . Drug use: No     Colonoscopy: September 2015, under [facility]  PSA:  Bone density:   No Known Allergies  Current  Facility-Administered Medications  Medication Dose Route Frequency Provider Last Rate Last Dose  . acetaminophen (TYLENOL) tablet 650 mg  650 mg Oral Q4H PRN Karmen Bongo, MD   650 mg at 11/29/16 2128  . aspirin chewable tablet 81 mg  81 mg Oral Daily Karmen Bongo, MD   81 mg at 11/30/16 1941  . Chlorhexidine Gluconate Cloth 2 % PADS 6 each  6 each Topical Q0600 Karmen Bongo, MD   6 each at 11/30/16 [phone]  . ferrous sulfate tablet 325 mg  325 mg Oral BID WC Karmen Bongo, MD   325 mg at 11/30/16 0829  . heparin ADULT infusion 100 units/mL (25000 units/255m sodium chloride 0.45%)  1,200 Units/hr Intravenous Continuous NMariel Aloe MD 12 mL/hr at 11/30/16 1334 1,200 Units/hr at 11/30/16 1334  . loratadine (CLARITIN) tablet 10 mg  10 mg Oral Daily YKarmen Bongo MD   10 mg at 11/30/16 0830  . metoprolol tartrate (LOPRESSOR) tablet 25 mg  25 mg Oral Q6H LLendon Colonel NP   25 mg at 11/30/16 1243  . mupirocin ointment (BACTROBAN) 2 % 1 application  1 application Nasal BID YKarmen Bongo MD   1 application at [id number] . ondansetron (ZOFRAN) injection 4 mg  4 mg Intravenous Q6H PRN YKarmen Bongo MD      . pantoprazole (PROTONIX) EC tablet 40 mg  40 mg Oral Daily YKarmen Bongo MD   40 mg at 11/30/16 0830  . polyethylene glycol (MIRALAX / GLYCOLAX) packet 17 g  17 g Oral BID NMariel Aloe MD   17 g at 11/30/16 1236  . simvastatin (ZOCOR) tablet 40 mg  40 mg Oral QHS YKarmen Bongo MD   40 mg at 11/29/16 2128    OBJECTIVE: Middle-aged white male who appears older than stated age  V45   11/30/16 0635 11/30/16 1243  BP: 111/60 99/66  Pulse:  (!) 126  Resp:    Temp:    SpO2:       Body mass index is 26.66 kg/m.   Wt Readings from Last 3 Encounters:  11/30/16 191 lb 2.2 oz (86.7 kg)  11/27/16 209 lb (94.8 kg)  11/30/13 209 lb (94.8 kg)      ECOG FS:3 - Symptomatic, >50% confined to bed  Ocular: Sclerae unicteric, left ptosis Lymphatic: Bilateral  cervical and supraclavicular adenopathy, right greater than left Lungs no rales or rhonchi, auscultated anterolaterally Heart regular rate and rhythm at present Abd soft, nontender, positive bowel sounds Neuro: Left lower extremity is 5-/5, otherwise 5/5 motor; well-oriented; appropriate affect  LAB RESULTS:  CMP     Component Value Date/Time   NA 135 11/27/2016 1357   K 3.8 11/27/2016 1357   CL 99 (L) 11/27/2016 1357   CO2 23 11/27/2016 1357   GLUCOSE 89 11/27/2016 1357   BUN 11 11/27/2016 1357  CREATININE 0.88 11/27/2016 1357   CALCIUM 8.6 (L) 11/27/2016 1357   GFRNONAA >60 11/27/2016 1357   GFRAA >60 11/27/2016 1357    No results found for: TOTALPROTELP, ALBUMINELP, A1GS, A2GS, BETS, BETA2SER, GAMS, MSPIKE, SPEI  No results found for: KPAFRELGTCHN, LAMBDASER, [facility]  Lab Results  Component Value Date   WBC 4.5 11/30/2016   HGB 8.0 (L) 11/30/2016   HCT 26.3 (L) 11/30/2016   MCV 88.0 11/30/2016   PLT 177 11/30/2016    '@LASTCHEMISTRY'$ @  No results found for: LABCA2  No components found for: LPFXTK240  Recent Labs  Lab 11/27/16 1515  INR 1.23    No results found for: LABCA2  No results found for: XBD532  No results found for: DJM426  No results found for: STM196  No results found for: CA2729  No components found for: HGQUANT  No results found for: CEA1 / No results found for: CEA1   No results found for: AFPTUMOR  No results found for: [place]  No results found for: PSA1  Admission on 11/27/2016  Component Date Value Ref Range Status  . Sodium 11/27/2016 135  135 - 145 mmol/L Final  . Potassium 11/27/2016 3.8  3.5 - 5.1 mmol/L Final  . Chloride 11/27/2016 99* 101 - 111 mmol/L Final  . CO2 11/27/2016 23  22 - 32 mmol/L Final  . Glucose, Bld 11/27/2016 89  65 - 99 mg/dL Final  . BUN 11/27/2016 11  6 - 20 mg/dL Final  . Creatinine, Ser 11/27/2016 0.88  0.61 - 1.24 mg/dL Final  . Calcium 11/27/2016 8.6* 8.9 - 10.3 mg/dL Final  . GFR  calc non Af Amer 11/27/2016 >60  >60 mL/min Final  . GFR calc Af Amer 11/27/2016 >60  >60 mL/min Final   Comment: (NOTE) The eGFR has been calculated using the CKD EPI equation. This calculation has not been validated in all clinical situations. eGFR's persistently <60 mL/min signify possible Chronic Kidney Disease.   . Anion gap 11/27/2016 13  5 - 15 Final  . WBC 11/27/2016 4.8  4.0 - 10.5 K/uL Final  . RBC 11/27/2016 3.15* 4.22 - 5.81 MIL/uL Final  . Hemoglobin 11/27/2016 8.6* 13.0 - 17.0 g/dL Final  . HCT 11/27/2016 27.6* 39.0 - 52.0 % Final  . MCV 11/27/2016 87.6  78.0 - 100.0 fL Final  . MCH 11/27/2016 27.3  26.0 - 34.0 pg Final  . MCHC 11/27/2016 31.2  30.0 - 36.0 g/dL Final  . RDW 11/27/2016 16.3* 11.5 - 15.5 % Final  . Platelets 11/27/2016 194  150 - 400 K/uL Final  . TSH 11/27/2016 2.881  0.350 - 4.500 uIU/mL Final   Performed by a 3rd Generation assay with a functional sensitivity of <=0.01 uIU/mL.  Marland Kitchen Prothrombin Time 11/27/2016 15.4* 11.4 - 15.2 seconds Final  . INR 11/27/2016 1.23   Final  . Magnesium 11/27/2016 2.0  1.7 - 2.4 mg/dL Final  . Weight 11/29/2016 3,040.58  oz Final  . Height 11/29/2016 71  in Final  . BP 11/29/2016 109/83  mmHg Final  . Troponin I 11/27/2016 <0.03  <0.03 ng/mL Final  . Cholesterol 11/28/2016 92  0 - 200 mg/dL Final  . Triglycerides 11/28/2016 82  <150 mg/dL Final  . HDL 11/28/2016 11* >40 mg/dL Final  . Total CHOL/HDL Ratio 11/28/2016 8.4  RATIO Final  . VLDL 11/28/2016 16  0 - 40 mg/dL Final  . LDL Cholesterol 11/28/2016 65  0 - 99 mg/dL Final   Comment:  Total Cholesterol/HDL:CHD Risk Coronary Heart Disease Risk Table                     Men   Women  1/2 Average Risk   3.4   3.3  Average Risk       5.0   4.4  2 X Average Risk   9.6   7.1  3 X Average Risk  23.4   11.0        Use the calculated Patient Ratio above and the CHD Risk Table to determine the patient's CHD Risk.        ATP III CLASSIFICATION (LDL):  <100      mg/dL   Optimal  100-129  mg/dL   Near or Above                    Optimal  130-159  mg/dL   Borderline  160-189  mg/dL   High  >190     mg/dL   Very High   . Heparin Unfractionated 11/28/2016 <0.10* 0.30 - 0.70 IU/mL Final   Comment:        IF HEPARIN RESULTS ARE BELOW EXPECTED VALUES, AND PATIENT DOSAGE HAS BEEN CONFIRMED, SUGGEST FOLLOW UP TESTING OF ANTITHROMBIN III LEVELS.   . WBC 11/28/2016 3.9* 4.0 - 10.5 K/uL Final  . RBC 11/28/2016 2.73* 4.22 - 5.81 MIL/uL Final  . Hemoglobin 11/28/2016 7.5* 13.0 - 17.0 g/dL Final  . HCT 11/28/2016 24.0* 39.0 - 52.0 % Final  . MCV 11/28/2016 87.9  78.0 - 100.0 fL Final  . MCH 11/28/2016 27.5  26.0 - 34.0 pg Final  . MCHC 11/28/2016 31.3  30.0 - 36.0 g/dL Final  . RDW 11/28/2016 16.5* 11.5 - 15.5 % Final  . Platelets 11/28/2016 177  150 - 400 K/uL Final  . MRSA by PCR 11/27/2016 POSITIVE* NEGATIVE Final   Comment:        The GeneXpert MRSA Assay (FDA approved for NASAL specimens only), is one component of a comprehensive MRSA colonization surveillance program. It is not intended to diagnose MRSA infection nor to guide or monitor treatment for MRSA infections. RESULT CALLED TO, READ BACK BY AND VERIFIED WITH: RN Madolyn Frieze [phone]   . Vitamin B-12 11/28/2016 638  180 - 914 pg/mL Final   Comment: (NOTE) This assay is not validated for testing neonatal or myeloproliferative syndrome specimens for Vitamin B12 levels.   . Folate 11/28/2016 14.0  >5.9 ng/mL Final  . Iron 11/28/2016 15* 45 - 182 ug/dL Final  . TIBC 11/28/2016 190* 250 - 450 ug/dL Final  . Saturation Ratios 11/28/2016 8* 17.9 - 39.5 % Final  . UIBC 11/28/2016 175  ug/dL Final  . Ferritin 11/28/2016 392* 24 - 336 ng/mL Final  . Retic Ct Pct 11/28/2016 3.8* 0.4 - 3.1 % Final  . RBC. 11/28/2016 3.03* 4.22 - 5.81 MIL/uL Final  . Retic Count, Absolute 11/28/2016 115.1  19.0 - 186.0 K/uL Final  . Color, Urine 11/28/2016 AMBER* YELLOW Final   BIOCHEMICALS MAY BE  AFFECTED BY COLOR  . APPearance 11/28/2016 HAZY* CLEAR Final  . Specific Gravity, Urine 11/28/2016 1.038* 1.005 - 1.030 Final  . pH 11/28/2016 5.0  5.0 - 8.0 Final  . Glucose, UA 11/28/2016 NEGATIVE  NEGATIVE mg/dL Final  . Hgb urine dipstick 11/28/2016 SMALL* NEGATIVE Final  . Bilirubin Urine 11/28/2016 NEGATIVE  NEGATIVE Final  . Ketones, ur 11/28/2016 NEGATIVE  NEGATIVE mg/dL Final  . Protein, ur 11/28/2016  30* NEGATIVE mg/dL Final  . Nitrite 11/28/2016 NEGATIVE  NEGATIVE Final  . Leukocytes, UA 11/28/2016 NEGATIVE  NEGATIVE Final  . RBC / HPF 11/28/2016 0-5  0 - 5 RBC/hpf Final  . WBC, UA 11/28/2016 0-5  0 - 5 WBC/hpf Final  . Bacteria, UA 11/28/2016 NONE SEEN  NONE SEEN Final  . Squamous Epithelial / LPF 11/28/2016 0-5* NONE SEEN Final  . Mucus 11/28/2016 PRESENT   Final  . Amorphous Crystal 11/28/2016 PRESENT   Final  . WBC 11/29/2016 4.2  4.0 - 10.5 K/uL Final  . RBC 11/29/2016 2.89* 4.22 - 5.81 MIL/uL Final  . Hemoglobin 11/29/2016 7.8* 13.0 - 17.0 g/dL Final  . HCT 11/29/2016 25.6* 39.0 - 52.0 % Final  . MCV 11/29/2016 88.6  78.0 - 100.0 fL Final  . MCH 11/29/2016 27.0  26.0 - 34.0 pg Final  . MCHC 11/29/2016 30.5  30.0 - 36.0 g/dL Final  . RDW 11/29/2016 16.8* 11.5 - 15.5 % Final  . Platelets 11/29/2016 193  150 - 400 K/uL Final  . Specimen Description 11/29/2016 BLOOD LEFT ANTECUBITAL   Final  . Special Requests 11/29/2016 IN PEDIATRIC BOTTLE Blood Culture adequate volume   Final  . Culture 11/29/2016 NO GROWTH 1 DAY   Final  . Report Status 11/29/2016 PENDING   Incomplete  . Specimen Description 11/29/2016 BLOOD LEFT HAND   Final  . Special Requests 11/29/2016 IN PEDIATRIC BOTTLE Blood Culture adequate volume   Final  . Culture 11/29/2016 NO GROWTH 1 DAY   Final  . Report Status 11/29/2016 PENDING   Incomplete  . WBC 11/30/2016 4.5  4.0 - 10.5 K/uL Final  . RBC 11/30/2016 2.99* 4.22 - 5.81 MIL/uL Final  . Hemoglobin 11/30/2016 8.0* 13.0 - 17.0 g/dL Final  . HCT  11/30/2016 26.3* 39.0 - 52.0 % Final  . MCV 11/30/2016 88.0  78.0 - 100.0 fL Final  . MCH 11/30/2016 26.8  26.0 - 34.0 pg Final  . MCHC 11/30/2016 30.4  30.0 - 36.0 g/dL Final  . RDW 11/30/2016 16.7* 11.5 - 15.5 % Final  . Platelets 11/30/2016 177  150 - 400 K/uL Final  . DAT, complement 11/30/2016 NEG   Final  . DAT, IgG 11/30/2016 NEG   Final    (this displays the last labs from the last 3 days)  No results found for: TOTALPROTELP, ALBUMINELP, A1GS, A2GS, BETS, BETA2SER, GAMS, MSPIKE, SPEI (this displays SPEP labs)  No results found for: KPAFRELGTCHN, LAMBDASER, KAPLAMBRATIO (kappa/lambda light chains)  No results found for: HGBA, HGBA2QUANT, HGBFQUANT, HGBSQUAN (Hemoglobinopathy evaluation)   No results found for: LDH  Lab Results  Component Value Date   IRON 15 (L) 11/28/2016   TIBC 190 (L) 11/28/2016   IRONPCTSAT 8 (L) 11/28/2016   (Iron and TIBC)  Lab Results  Component Value Date   FERRITIN 392 (H) 11/28/2016    Urinalysis    Component Value Date/Time   COLORURINE AMBER (A) 11/28/2016 2153   APPEARANCEUR HAZY (A) 11/28/2016 2153   LABSPEC 1.038 (H) 11/28/2016 2153   PHURINE 5.0 11/28/2016 2153   GLUCOSEU NEGATIVE 11/28/2016 2153   HGBUR SMALL (A) 11/28/2016 2153   [place] NEGATIVE 11/28/2016 2153   [place] 11/28/2016 2153   PROTEINUR 30 (A) 11/28/2016 2153   NITRITE NEGATIVE 11/28/2016 2153   LEUKOCYTESUR NEGATIVE 11/28/2016 2153     STUDIES: Ct Soft Tissue Neck Wo Contrast  Result Date: 11/21/2016 CLINICAL DATA:  Initial evaluation for right-sided neck swelling. EXAM: CT NECK WITHOUT CONTRAST TECHNIQUE:  Multidetector CT imaging of the neck was performed following the standard protocol without intravenous contrast. COMPARISON:  None available. FINDINGS: Pharynx and larynx: Oral cavity within normal limits without mass lesion or loculated fluid collection. Patient is edentulous. Palatine tonsils symmetric and within normal limits  bilaterally. Few small calcified tonsilliths noted. Parapharyngeal fat preserved. Nasopharynx within normal limits. Retropharyngeal soft tissues demonstrate no acute abnormality. Epiglottis normal. Vallecula clear. Small amount of layering secretions present within the right hypopharynx, extending into the right piriform sinus. Remainder of the hypopharynx and supraglottic larynx grossly normal. True cords symmetric and within normal limits. Subglottic airway clear. Salivary glands: Salivary glands including the parotid and submandibular glands are within normal limits bilaterally. Thyroid: Thyroid within normal limits. Lymph nodes: Extensive bulky adenopathy seen throughout the neck and visualized upper chest, slightly worse on the right as compared to the left. Nodes are seen at essentially all levels, most prevalent within the right supraclavicular and axillary region. For reference purposes, largest discrete node within the right supraclavicular region measures approximately 2.6 x 5.1 cm (series 2, image 85). Largest discrete nodal mass within the right axillary region measures approximately 9.4 x 3.7 cm (series 2, image 114). In the left neck, largest discrete node seen at left level III and measures 1.8 x 2.7 cm (series 2, image 71). Largest node within the left upper chest seen within the left subpectoral region and measures 5.4 x 2.9 cm (series 2, image 106). Study nodes seen within the partially visualized upper mediastinum as well, largest of which measures 1 cm in the prevascular region. Findings highly concerning for lymphoproliferative disorder/ lymphoma. Vascular: Atherosclerotic changes noted about the aortic arch and carotid bifurcations. Limited intracranial: Unremarkable. Visualized orbits: Partially visualized globes and orbital soft tissues within normal limits. Mastoids and visualized paranasal sinuses: Chronic maxillary sinusitis noted, right worse than left. Retained metallic density at the  posterior right maxillary sinus. Sequelae of prior ORIF at the anterior maxillary sinuses/alveolar ridge. Visualize mastoids and middle ear cavities are clear. Skeleton: No acute osseus abnormality. No worrisome lytic or blastic osseous lesions. Straightening of the normal cervical lordosis with trace retrolisthesis of C3 on C4. Moderate to advanced degenerate spondylolysis at C5-6. Upper chest: Partially visualized lungs are clear. Other: None. IMPRESSION: 1. Extensive bulky adenopathy throughout the neck and visualized upper chest, right worse than left, highly concerning for lymphoproliferative disorder/lymphoma. 2. No other acute abnormality within the neck. 3. Chronic maxillary sinusitis. Electronically Signed   By: Jeannine Boga M.D.   On: 11/21/2016 15:27   Ct Angio Chest Pe W And/or Wo Contrast  Result Date: 11/27/2016 CLINICAL DATA:  Tachycardia. Suspect pulmonary embolism. History of hypertension, ex smoker, bladder tumor. Scheduled for neck biopsy today. EXAM: CT ANGIOGRAPHY CHEST WITH CONTRAST TECHNIQUE: Multidetector CT imaging of the chest was performed using the standard protocol during bolus administration of intravenous contrast. Multiplanar CT image reconstructions and MIPs were obtained to evaluate the vascular anatomy. CONTRAST:  152m ISOVUE-370 IOPAMIDOL (ISOVUE-370) INJECTION 76% COMPARISON:  CT neck November 21, 2016 and chest radiograph November 27, 2016 and CT abdomen and pelvis June 22, 2012 FINDINGS: CARDIOVASCULAR: Adequate contrast opacification of the pulmonary artery's. Main pulmonary artery is not enlarged. No pulmonary arterial filling defects to the level of the segmental branches, distal branches obscured by respiratory motion. Heart is upper limits of normal in size. Mild coronary artery calcifications. Small pericardial effusion. Thoracic aorta is normal course and caliber, unremarkable. MEDIASTINUM/NODES: Greater than expected number of mildly enlarged mediastinal  and hilar  lymph nodes. Anterior mediastinal fat stranding. Bulky RIGHT greater than LEFT axillary lymphadenopathy, 12.9 x 6.7 cm RIGHT axillary nodal conglomeration. Bulky RIGHT grand LEFT supraclavicular lymphadenopathy. LUNGS/PLEURA: Tracheobronchial tree is patent, no pneumothorax. No pleural effusions, focal consolidations, pulmonary nodules or masses. UPPER ABDOMEN: New splenomegaly. 6.7 cm cyst (5 Hounsfield units) LEFT renal fossa, larger than prior CT. Stable 3 cm cyst (3 Hounsfield units) RIGHT lobe of the liver. Partially imaged probable portal caval lymphadenopathy. MUSCULOSKELETAL: Nonacute. RIGHT biceps intramuscular lipoma versus atrophy. Multilevel scattered Schmorl's nodes. Review of the MIP images confirms the above findings. IMPRESSION: 1. No acute pulmonary embolism. 2. Bulky supraclavicular, axillary and possibly portal caval lymphadenopathy. 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 3. Borderline cardiomegaly.  No acute pulmonary process. Aortic Atherosclerosis (ICD10-I70.0). Electronically Signed   By: Elon Alas M.D.   On: 11/27/2016 17:13   Dg Chest Port 1 View  Result Date: 11/27/2016 CLINICAL DATA:  Atrial fibrillation EXAM: PORTABLE CHEST 1 VIEW COMPARISON:  None. FINDINGS: Cardiac silhouette is upper limits of normal. Mild aortic atherosclerotic calcification. No focal airspace consolidation or pulmonary edema. No pneumothorax or sizable pleural effusion. IMPRESSION: No active disease. Aortic Atherosclerosis (ICD10-I70.0). Electronically Signed   By: Ulyses Jarred M.D.   On: 11/27/2016 15:17   [place] Core Biopsy (lymph Nodes)  Result Date: 11/28/2016 INDICATION: Bulky cervical axillary lymphadenopathy. Please perform ultrasound-guided biopsy for tissue diagnostic purposes. EXAM: ULTRASOUND-GUIDED RIGHT CERVICAL LYMPH NODE BIOPSY COMPARISON:  Neck CT - 11/21/2016; chest CT - 11/17/2016 MEDICATIONS: None ANESTHESIA/SEDATION: Moderate  (conscious) sedation was employed during this procedure. A total of Versed 2 mg and Fentanyl 75 mcg was administered intravenously. Moderate Sedation Time: 10 minutes. The patient's level of consciousness and vital signs were monitored continuously by radiology nursing throughout the procedure under my direct supervision. COMPLICATIONS: None immediate. TECHNIQUE: Informed written consent was obtained from the patient after a discussion of the risks, benefits and alternatives to treatment. Questions regarding the procedure were encouraged and answered. Initial ultrasound scanning demonstrated multiple enlarged cervical lymph nodes. A dominant approximately 3.2 x 1.6 cm lymph node within the superficial aspect of the mid right lateral neck was targeted for biopsy given lymph node location and sonographic window. An ultrasound image was saved for documentation purposes. The procedure was planned. A timeout was performed prior to the initiation of the procedure. The operative was prepped and draped in the usual sterile fashion, and a sterile drape was applied covering the operative field. A timeout was performed prior to the initiation of the procedure. Local anesthesia was provided with 1% lidocaine with epinephrine. Under direct ultrasound guidance, an 18 gauge core needle device was utilized to obtain to obtain 6 core needle biopsies of the dominant right cervical lymph node. The samples were placed in saline and submitted to pathology. The needle was removed and hemostasis was achieved with manual compression. Post procedure scan was negative for significant hematoma. A dressing was placed. The patient tolerated the procedure well without immediate postprocedural complication. IMPRESSION: Technically successful ultrasound guided biopsy of dominant right cervical lymph node. Electronically Signed   By: Sandi Mariscal M.D.   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  The initial reason for this consultation was anemia.  We discussed the multiple possible reasons for anemia and I am concerned that given his reticulocyte count we may be dealing with red cell destruction.   If that is the case he would need to start on steroids, and we would also use rituximab in an attempt to minimize problems regarding hyperglycemia and other steroid side effects.  Note his Hb has been relatively stable especially accounting for hydration and multiple blood draw effects. I do not see an indication for transfusion at this point.   Autoimmune hemolysis can be associated with lymphoma and [name] [name] has significant adenopathy. Some lymphomas can colonize the marrow and cause cytopenias, though in this case white cells and platelets are not affected. He may need a bone marrow biopsy for staging depending on the type of NHL we may be deaing with, but that could be done on an outpatient basis  The node biopsy results should be available tomorrow, although in some cases specific studies to subcategorize the lymphoma can take longer. If he does have NHL, specifically a B-cell malignancy, there is significant overlap in treatment as both sterids and rituximab can be therapeutic  In short at this point we are waiting on biopsy and additional lab results. [name] [name] is aware of the situation and the likely diagnoses and treatment.  Will follow with you  Chauncey Cruel, MD   11/30/2016 2:50 PM Medical Oncology and Hematology [facility] ([facility]) [address], [place] [address] Tel. [phone]    Fax. [phone]

## 2016-11-30 NOTE — Progress Notes (Signed)
Progress Note  Patient Name: [name] [name] Date of Encounter: 11/30/2016  Primary Cardiologist: Dr. Percival Spanish, MD  Subjective   He states that he has had blood in his urine for a while, intermittently in his stool.   Inpatient Medications    Scheduled Meds: . aspirin  81 mg Oral Daily  . Chlorhexidine Gluconate Cloth  6 each Topical Q0600  . ferrous sulfate  325 mg Oral BID WC  . loratadine  10 mg Oral Daily  . metoprolol tartrate  25 mg Oral Q6H  . mupirocin ointment  1 application Nasal BID  . pantoprazole  40 mg Oral Daily  . polyethylene glycol  17 g Oral BID  . simvastatin  40 mg Oral QHS   Continuous Infusions:  PRN Meds: acetaminophen, ondansetron (ZOFRAN) IV   Vital Signs    Vitals:   11/30/16 0051 11/30/16 0155 11/30/16 0400 11/30/16 0635  BP: 105/67 101/72 93/60 111/60  Pulse: (!) 133 (!) 122    Resp:      Temp: 98.9 F (37.2 C)  97.7 F (36.5 C)   TempSrc: Oral  Oral   SpO2: 95%     Weight:   86.7 kg (191 lb 2.2 oz)   Height:        Intake/Output Summary (Last 24 hours) at 11/30/2016 1104 Last data filed at 11/29/2016 2200 Gross per 24 hour  Intake 660 ml  Output 1000 ml  Net -340 ml   Filed Weights   11/28/16 0532 11/29/16 0532 11/30/16 0400  Weight: 85 kg (187 lb 6.3 oz) 86.2 kg (190 lb 0.6 oz) 86.7 kg (191 lb 2.2 oz)    Telemetry    Atrial fib with RVR, Rates 120 bpm - Personally Reviewed.   Physical Exam   GEN: No acute distress.   Neck: No JVD Cardiac: IRRR,tachycardic no murmurs, rubs, or gallops.  Respiratory: Coarse breath sounds. GI: Soft, nontender, non-distended Obese  MS: No edema; No deformity. Neuro:  Nonfocal  Psych: Normal affect   Labs    Chemistry Recent Labs  Lab 11/27/16 1357  NA 135  K 3.8  CL 99*  CO2 23  GLUCOSE 89  BUN 11  CREATININE 0.88  CALCIUM 8.6*  GFRNONAA >60  GFRAA >60  ANIONGAP 13     Hematology Recent Labs  Lab 11/28/16 0550 11/28/16 1438 11/29/16 0517 11/30/16 0447  WBC  3.9*  --  4.2 4.5  RBC 2.73* 3.03* 2.89* 2.99*  HGB 7.5*  --  7.8* 8.0*  HCT 24.0*  --  25.6* 26.3*  MCV 87.9  --  88.6 88.0  MCH 27.5  --  27.0 26.8  MCHC 31.3  --  30.5 30.4  RDW 16.5*  --  16.8* 16.7*  PLT 177  --  193 177    Cardiac Enzymes Recent Labs  Lab 11/27/16 2130  TROPONINI <0.03    Radiology    [place] Core Biopsy (lymph Nodes)  Result Date: 11/28/2016 INDICATION: Bulky cervical axillary lymphadenopathy. Please perform ultrasound-guided biopsy for tissue diagnostic purposes. EXAM: ULTRASOUND-GUIDED RIGHT CERVICAL LYMPH NODE BIOPSY COMPARISON:  Neck CT - 11/21/2016; chest CT - 11/17/2016 MEDICATIONS: None ANESTHESIA/SEDATION: Moderate (conscious) sedation was employed during this procedure. A total of Versed 2 mg and Fentanyl 75 mcg was administered intravenously. Moderate Sedation Time: 10 minutes. The patient's level of consciousness and vital signs were monitored continuously by radiology nursing throughout the procedure under my direct supervision. COMPLICATIONS: None immediate. TECHNIQUE: Informed written consent was obtained from the patient  after a discussion of the risks, benefits and alternatives to treatment. Questions regarding the procedure were encouraged and answered. Initial ultrasound scanning demonstrated multiple enlarged cervical lymph nodes. A dominant approximately 3.2 x 1.6 cm lymph node within the superficial aspect of the mid right lateral neck was targeted for biopsy given lymph node location and sonographic window. An ultrasound image was saved for documentation purposes. The procedure was planned. A timeout was performed prior to the initiation of the procedure. The operative was prepped and draped in the usual sterile fashion, and a sterile drape was applied covering the operative field. A timeout was performed prior to the initiation of the procedure. Local anesthesia was provided with 1% lidocaine with epinephrine. Under direct ultrasound guidance, an 18  gauge core needle device was utilized to obtain to obtain 6 core needle biopsies of the dominant right cervical lymph node. The samples were placed in saline and submitted to pathology. The needle was removed and hemostasis was achieved with manual compression. Post procedure scan was negative for significant hematoma. A dressing was placed. The patient tolerated the procedure well without immediate postprocedural complication. IMPRESSION: Technically successful ultrasound guided biopsy of dominant right cervical lymph node. Electronically Signed   By: Sandi Mariscal M.D.   On: 11/28/2016 13:48    Cardiac Studies  Echocardiogram 11/29/2016  - Left ventricle: Wall thickness was increased in a pattern of moderate LVH. Systolic function was normal. The estimated ejection fraction was in the range of 55% to 60%. Wall motion was normal; there were no regional wall motion abnormalities. The study is not technically sufficient to allow evaluation of LV diastolic function. - Aortic valve: Trileaflet. Sclerosis without stenosis. There was trivial regurgitation. - Mitral valve: Mildly thickened leaflets . There was trivial regurgitation. - Left atrium: Moderately dilated. - Right ventricle: The cavity size was mildly dilated. Low normal systolic function. - Right atrium: Moderately dilated. - Tricuspid valve: There was mild regurgitation. - Pulmonary arteries: PA peak pressure: 35 mm Hg (S). - Inferior vena cava: The vessel was normal in size. The respirophasic diameter changes were in the normal range (>= 50%), consistent with normal central venous pressure. - Pericardium, extracardiac: A trivial pericardial effusion was identified posterior to the heart. Features were not consistent with tamponade physiology.  Impressions:  - LVEF 55-60%, moderate LVH, normal wall motion, aortic valve sclerosis with trivial AI, moderate biatrial enlargement, mild RVE with low  normal RV systolic function, mild TR, RVSP 35 mmHg, normal IVC, trivial posterior pericardial effusion.   Patient Profile     66 y.o. male with known history of hypertension, hyperlipidemia, CVA with new onset atrial fib with RVR.   Assessment & Plan    1. Atrial Fib with RVR: Currently not rate controlled. Will begin diltiazem gtt for better rate control and hopefully be able to transition to po. EF of 55%-60%. Watch BP closely as it is soft. Stop metoprolol. CHADS VASC Score of %. Will need to discuss anticoagulation. Needs DOAC. With intellectual difficulties once a day dosing would serve him better. Will discuss with Dr. Meda Coffee before staring in the setting of anemia. No active bleeding. May need TEE DCCV  2. Hypertension: BP is soft on metoprolol.   3. Normocytic Anemia: Hgb of 8.0 this am.  Concern for adding DOAC.   Signed, Phill Myron. West Pugh, ANP, AACC   11/30/2016, 11:04 AM    The patient was seen, examined and discussed with Jory Sims, NP and I agree with the above.  The patient remains in a-fib with RVR, metoprolol increased yesterday, he is severely anemic - normocytic, he states that he has had hematuria for a while and intermittent blood in the stool, he has h/o polyps in the colon. His CHADS-VASc is 6, with h/o stroke. I would start heparin drip - to see if he drops Hb, call GI and urology consult, switch to NOAC at discharge. Change metoprolol to 50 mg po BID.   Ena Dawley, MD 11/30/2016

## 2016-11-30 NOTE — Progress Notes (Signed)
PROGRESS NOTE    [name] [name]  BTD:[id number] DOB: May 20, 1950 DOA: 11/27/2016 PCP: Wenda Low, MD   Brief Narrative: [name] [name] is a 65 y.o. male with medical history significant of HTN, HLD, CVA, CKD, and CHF presenting with afib. He was admitted for atrial fibrillation with RVR while attempting to undergo a core biopsy. Atrial fibrillation initially controlled on diltiazem drip but has not been controlled on metoprolol.    Assessment & Plan:   Principal Problem:   Atrial fibrillation with RVR (HCC) Active Problems:   Mild intellectual disability   Essential hypertension   Normocytic anemia   Lymphadenopathy   Atrial fibrillation with RVR Patient started on Cardizem drip and heparin drip which have been discontinued. Heart rate significantly elevated into the 120s, non-sustained, but frequently. Echocardiogram significant for moderate biatrial enlargement, trivial pericardial effusion, EF if 55-60% with no evidence of wall motion abnormalities -cardiology recommendations: restart cardizem drip  Anemia of chronic disease Patient with hematuria, hematochezia and finds blood in his mouth at times. Unknown etiology. INR normal. Hemoglobin slightly down today. Chart review shows problem of bladder neoplasm and history of rectal bleeding. Colonoscopy three years ago significant for a polyp in sigmoid colon and colonic polyposis. Flexible sigmoidoscopy 2 years ago 5 cm pedunculated polyp. Pathology included results of tubular adenoma and lipoma. 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 Hemoglobin stable. -FOBT pending  Left cervical lymphadenopathy IR performed biopsy (11/28/16). PCP can follow-up results. Area with some bruising.   Essential hypertension Normotensive. -metoprolol per cardiology  History of CVA Hyperlipidemia -continue Simvastatin  CHF Listed as problem. Echocardiogram rules against this  diagnosis.  Constipation -Miralax  Mild intellectual disability  Fever Unknown source. No leukocytosis. Patient asymptomatic. Biopsy area with overlying ecchymosis and is without erythema.  -blood cultures pending -hold off on starting antibiotics   DVT prophylaxis: SCDs Code Status: Full code Family Communication: None at bedside Disposition Plan: Discharge to SNF when medically stable   Consultants:   Cardiology  Interventional radiology  Procedures:   Lymph node biopsy (11/16)  Antimicrobials:  None    Subjective: Afebrile. No palpitations or chest pain.  Objective: Vitals:   11/30/16 0051 11/30/16 0155 11/30/16 0400 11/30/16 0635  BP: 105/67 101/72 93/60 111/60  Pulse: (!) 133 (!) 122    Resp:      Temp: 98.9 F (37.2 C)  97.7 F (36.5 C)   TempSrc: Oral  Oral   SpO2: 95%     Weight:   86.7 kg (191 lb 2.2 oz)   Height:        Intake/Output Summary (Last 24 hours) at 11/30/2016 1150 Last data filed at 11/29/2016 2200 Gross per 24 hour  Intake 660 ml  Output 1000 ml  Net -340 ml   Filed Weights   11/28/16 0532 11/29/16 0532 11/30/16 0400  Weight: 85 kg (187 lb 6.3 oz) 86.2 kg (190 lb 0.6 oz) 86.7 kg (191 lb 2.2 oz)    Examination:  General exam: Appears calm and comfortable HEENT: right cervical neck adenopathy without tenderness and overlying ecchymosis Respiratory system: Clear to auscultation bilaterally. Unlabored work of breathing. No wheezing or rales. Cardiovascular system: S1 & S2 heard, Irregular rhythm, fast rate. No murmurs, rubs, gallops or clicks. Gastrointestinal system: Abdomen is nondistended, soft and nontender. No organomegaly or masses felt. Normal bowel sounds heard. Central nervous system: Alert and oriented. No focal neurological deficits. Extremities: No edema. No calf tenderness. Right arm is significantly enlarged compared  to left with no tenderness Skin: No cyanosis. No rashes Psychiatry: Judgement and insight  appear normal. Mood & affect appropriate.     Data Reviewed: I have personally reviewed following labs and imaging studies  CBC: Recent Labs  Lab 11/27/16 1150 11/27/16 1357 11/28/16 0550 11/29/16 0517 11/30/16 0447  WBC 4.6 4.8 3.9* 4.2 4.5  HGB 8.9* 8.6* 7.5* 7.8* 8.0*  HCT 28.3* 27.6* 24.0* 25.6* 26.3*  MCV 87.9 87.6 87.9 88.6 88.0  PLT 199 194 177 193 259   Basic Metabolic Panel: Recent Labs  Lab 11/27/16 1357 11/27/16 1515  NA 135  --   K 3.8  --   CL 99*  --   CO2 23  --   GLUCOSE 89  --   BUN 11  --   CREATININE 0.88  --   CALCIUM 8.6*  --   MG  --  2.0   GFR: Estimated Creatinine Clearance: 87.9 mL/min (by C-G formula based on SCr of 0.88 mg/dL). Liver Function Tests: No results for input(s): AST, ALT, ALKPHOS, BILITOT, PROT, ALBUMIN in the last 168 hours. No results for input(s): LIPASE, AMYLASE in the last 168 hours. No results for input(s): AMMONIA in the last 168 hours. Coagulation Profile: Recent Labs  Lab 11/27/16 1150 11/27/16 1515  INR 1.21 1.23   Cardiac Enzymes: Recent Labs  Lab 11/27/16 2130  TROPONINI <0.03   BNP (last 3 results) No results for input(s): PROBNP in the last 8760 hours. HbA1C: No results for input(s): HGBA1C in the last 72 hours. CBG: No results for input(s): GLUCAP in the last 168 hours. Lipid Profile: Recent Labs    11/28/16 0550  CHOL 92  HDL 11*  LDLCALC 65  TRIG 82  CHOLHDL 8.4   Thyroid Function Tests: Recent Labs    11/27/16 1515  TSH 2.881   Anemia Panel: Recent Labs    11/28/16 1429 11/28/16 1438  VITAMINB12  --  638  FOLATE 14.0  --   FERRITIN  --  392*  TIBC  --  190*  IRON  --  15*  RETICCTPCT  --  3.8*   Sepsis Labs: No results for input(s): PROCALCITON, LATICACIDVEN in the last 168 hours.  Recent Results (from the past 240 hour(s))  MRSA PCR Screening     Status: Abnormal   Collection Time: 11/27/16  9:53 PM  Result Value Ref Range Status   MRSA by PCR POSITIVE (A) NEGATIVE  Final    Comment:        The GeneXpert MRSA Assay (FDA approved for NASAL specimens only), is one component of a comprehensive MRSA colonization surveillance program. It is not intended to diagnose MRSA infection nor to guide or monitor treatment for MRSA infections. RESULT CALLED TO, READ BACK BY AND VERIFIED WITH: RN Madolyn Frieze [id number] MLM   Culture, blood (routine x 2)     Status: None (Preliminary result)   Collection Time: 11/29/16 12:18 AM  Result Value Ref Range Status   Specimen Description BLOOD LEFT ANTECUBITAL  Final   Special Requests IN PEDIATRIC BOTTLE Blood Culture adequate volume  Final   Culture NO GROWTH 1 DAY  Final   Report Status PENDING  Incomplete  Culture, blood (routine x 2)     Status: None (Preliminary result)   Collection Time: 11/29/16 12:18 AM  Result Value Ref Range Status   Specimen Description BLOOD LEFT HAND  Final   Special Requests IN PEDIATRIC BOTTLE Blood Culture adequate volume  Final  Culture NO GROWTH 1 DAY  Final   Report Status PENDING  Incomplete         Radiology Studies: No results found.      Scheduled Meds: . aspirin  81 mg Oral Daily  . Chlorhexidine Gluconate Cloth  6 each Topical Q0600  . ferrous sulfate  325 mg Oral BID WC  . loratadine  10 mg Oral Daily  . metoprolol tartrate  25 mg Oral Q6H  . mupirocin ointment  1 application Nasal BID  . pantoprazole  40 mg Oral Daily  . polyethylene glycol  17 g Oral BID  . simvastatin  40 mg Oral QHS   Continuous Infusions:   LOS: 3 days     Cordelia Poche, MD [facility] 11/30/2016, 11:50 AM Pager: [phone]  If 7PM-7AM, please contact night-coverage [email or web address] Password [email or web address] 11/30/2016, 11:50 AM

## 2016-11-30 NOTE — Progress Notes (Signed)
Spoke with Dr Maudie Mercury covering for [facility] re: ? Hematoma to Right neck that feels slightly warmer to the touch than the left side and is slightly tender to touch as per patient, not sure with elevated temp what's going on but there is a enlarged are seen on a CT scan to that area. HR remains elevated also, will continue to monitor but MD is coming up to assess also.

## 2016-11-30 NOTE — Progress Notes (Signed)
Pt's HR remains 110-130 even with the Tylenol given, call Heart care MD on call, will give another 25 mg Metoprolol po and continue to monitor.

## 2016-11-30 NOTE — Progress Notes (Signed)
ANTICOAGULATION CONSULT NOTE Pharmacy Consult for Heparin Indication: atrial fibrillation  No Known Allergies  Patient Measurements: Height: 5\' 11"  (180.3 cm) Weight: 191 lb 2.2 oz (86.7 kg) IBW/kg (Calculated) : 75.3 Heparin Dosing Weight: 86kg  Vital Signs: Temp: 101 F (38.3 C) (11/18 2030) Temp Source: Oral (11/18 2030) BP: 97/64 (11/18 2030) Pulse Rate: 120 (11/18 2030)  Labs: Recent Labs    11/27/16 2130  11/28/16 0550 11/29/16 0517 11/30/16 0447 11/30/16 1347 11/30/16 1857  HGB  --    < > 7.5* 7.8* 8.0*  --   --   HCT  --   --  24.0* 25.6* 26.3*  --   --   PLT  --   --  177 193 177  --   --   HEPARINUNFRC  --   --  <0.10*  --   --   --  <0.10*  CREATININE  --   --   --   --   --  0.72  --   TROPONINI <0.03  --   --   --   --   --   --    < > = values in this interval not displayed.    Estimated Creatinine Clearance: 96.7 mL/min (by C-G formula based on SCr of 0.72 mg/dL).   Medical History: Past Medical History:  Diagnosis Date  . Bladder tumor   . Cataract   . CVA (cerebral infarction)    2008  . Hyperlipidemia   . 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 Per MD patient reports hematuria, hematochezia, and finds blood in mouth sometimes; of note, has history of polyps in sigmoid colon.  Pharmacy consulted for heparin for atrial fibrillation. No PTA AC.  CBC stable but low due to anemia of chronic disease. No active bleeding currently and no IV line issues per RN. Will not bolus due to bleeding history.  Goal of Therapy:  Heparin level 0.3-0.7 units/ml Monitor platelets by anticoagulation protocol: Yes   Plan:  Increase heparin infusion to 1500 units/hr (no bolus) 6 hour heparin level Daily heparin level, CBC Monitor clinical course, s/sx bleeding   Elicia Lamp,  PharmD, BCPS Clinical Pharmacist 11/30/2016 9:03 PM

## 2016-11-30 NOTE — Progress Notes (Signed)
ANTICOAGULATION CONSULT NOTE - Initial Consult  Pharmacy Consult for Heparin Indication: atrial fibrillation  No Known Allergies  Patient Measurements: Height: 5\' 11"  (180.3 cm) Weight: 191 lb 2.2 oz (86.7 kg) IBW/kg (Calculated) : 75.3 Heparin Dosing Weight: 86kg  Vital Signs: Temp: 97.7 F (36.5 C) (11/18 0400) Temp Source: Oral (11/18 0400) BP: 111/60 (11/18 0635) Pulse Rate: 122 (11/18 0155)  Labs: Recent Labs    11/27/16 1357 11/27/16 1515 11/27/16 2130 11/28/16 0550 11/29/16 0517 11/30/16 0447  HGB 8.6*  --   --  7.5* 7.8* 8.0*  HCT 27.6*  --   --  24.0* 25.6* 26.3*  PLT 194  --   --  177 193 177  LABPROT  --  15.4*  --   --   --   --   INR  --  1.23  --   --   --   --   HEPARINUNFRC  --   --   --  <0.10*  --   --   CREATININE 0.88  --   --   --   --   --   TROPONINI  --   --  <0.03  --   --   --     Estimated Creatinine Clearance: 87.9 mL/min (by C-G formula based on SCr of 0.88 mg/dL).   Medical History: Past Medical History:  Diagnosis Date  . Bladder tumor   . Cataract   . CVA (cerebral infarction)    2008  . Hyperlipidemia   . 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 Per MD patient reports hematuria, hematochezia, and finds blood in mouth sometimes; of note, has history of polyps in sigmoid colon.  Pharmacy consulted for heparin for atrial fibrillation. No PTA AC.  CBC stable but low due to anemia of chronic disease. Will not bolus due to bleeding history.  Goal of Therapy:  Heparin level 0.3-0.7 units/ml Monitor platelets by anticoagulation protocol: Yes   Plan:  Start heparin infusion at 1200 units/hr  6 hour heparin level Daily heparin level, CBC Monitor clinical course, s/sx bleeding  Saphyre Cillo 11/30/2016,12:33 PM

## 2016-12-01 ENCOUNTER — Inpatient Hospital Stay (HOSPITAL_COMMUNITY): Payer: Medicare Other

## 2016-12-01 ENCOUNTER — Encounter (HOSPITAL_COMMUNITY): Payer: Self-pay | Admitting: Cardiology

## 2016-12-01 LAB — URINALYSIS, ROUTINE W REFLEX MICROSCOPIC
Glucose, UA: NEGATIVE mg/dL
Ketones, ur: NEGATIVE mg/dL
LEUKOCYTES UA: NEGATIVE
Nitrite: NEGATIVE
PH: 5 (ref 5.0–8.0)
Protein, ur: 30 mg/dL — AB
SPECIFIC GRAVITY, URINE: 1.029 (ref 1.005–1.030)

## 2016-12-01 LAB — CBC
HCT: 25 % — ABNORMAL LOW (ref 39.0–52.0)
Hemoglobin: 7.7 g/dL — ABNORMAL LOW (ref 13.0–17.0)
MCH: 27.3 pg (ref 26.0–34.0)
MCHC: 30.8 g/dL (ref 30.0–36.0)
MCV: 88.7 fL (ref 78.0–100.0)
PLATELETS: 151 10*3/uL (ref 150–400)
RBC: 2.82 MIL/uL — AB (ref 4.22–5.81)
RDW: 16.8 % — ABNORMAL HIGH (ref 11.5–15.5)
WBC: 4 10*3/uL (ref 4.0–10.5)

## 2016-12-01 LAB — HEPARIN LEVEL (UNFRACTIONATED): HEPARIN UNFRACTIONATED: 0.11 [IU]/mL — AB (ref 0.30–0.70)

## 2016-12-01 LAB — OCCULT BLOOD X 1 CARD TO LAB, STOOL: FECAL OCCULT BLD: NEGATIVE

## 2016-12-01 LAB — BASIC METABOLIC PANEL
ANION GAP: 8 (ref 5–15)
BUN: 11 mg/dL (ref 6–20)
CO2: 25 mmol/L (ref 22–32)
Calcium: 7.8 mg/dL — ABNORMAL LOW (ref 8.9–10.3)
Chloride: 103 mmol/L (ref 101–111)
Creatinine, Ser: 0.7 mg/dL (ref 0.61–1.24)
Glucose, Bld: 108 mg/dL — ABNORMAL HIGH (ref 65–99)
POTASSIUM: 3.3 mmol/L — AB (ref 3.5–5.1)
SODIUM: 136 mmol/L (ref 135–145)

## 2016-12-01 LAB — TROPONIN I: Troponin I: <0.03 ng/mL (ref <0.03)

## 2016-12-01 LAB — PATHOLOGIST SMEAR REVIEW

## 2016-12-01 LAB — HAPTOGLOBIN: HAPTOGLOBIN: 382 mg/dL — AB (ref 34–200)

## 2016-12-01 LAB — BETA 2 MICROGLOBULIN, SERUM: Beta-2 Microglobulin: 5.6 mg/L — ABNORMAL HIGH (ref 0.6–2.4)

## 2016-12-01 MED ORDER — HEPARIN (PORCINE) IN NACL 100-0.45 UNIT/ML-% IJ SOLN
2450.0000 [IU]/h | INTRAMUSCULAR | Status: DC
  Administered 2016-12-02: 2450 [IU]/h via INTRAVENOUS
  Filled 2016-12-01 (×2): qty 250

## 2016-12-01 MED ORDER — CEPHALEXIN 500 MG PO CAPS: 500.0000 mg | ORAL_CAPSULE | Freq: Four times a day (QID) | ORAL | Status: DC

## 2016-12-01 MED ORDER — POTASSIUM CHLORIDE CRYS ER 20 MEQ PO TBCR
40.0000 meq | EXTENDED_RELEASE_TABLET | Freq: Once | ORAL | Status: AC
  Administered 2016-12-01: 40 meq via ORAL
  Filled 2016-12-01: qty 2

## 2016-12-01 MED ORDER — ROSUVASTATIN CALCIUM 10 MG PO TABS
20.0000 mg | ORAL_TABLET | Freq: Every day | ORAL | Status: DC
  Administered 2016-12-01 – 2016-12-06 (×6): 20 mg via ORAL
  Filled 2016-12-01 (×7): qty 2

## 2016-12-01 MED ORDER — DOXYCYCLINE HYCLATE 100 MG PO TABS
100.0000 mg | ORAL_TABLET | Freq: Two times a day (BID) | ORAL | Status: DC
  Administered 2016-12-01 – 2016-12-03 (×5): 100 mg via ORAL
  Filled 2016-12-01 (×5): qty 1

## 2016-12-01 MED ORDER — DILTIAZEM HCL 60 MG PO TABS
60.0000 mg | ORAL_TABLET | Freq: Four times a day (QID) | ORAL | Status: DC
  Administered 2016-12-01 – 2016-12-02 (×4): 60 mg via ORAL
  Filled 2016-12-01 (×4): qty 1

## 2016-12-01 NOTE — Progress Notes (Signed)
Progress Note  Patient Name: [name] [name] Date of Encounter: 12/01/2016  Primary Cardiologist: Dr. Vita Barley  Subjective   No chest pain,  no SOB  Inpatient Medications    Scheduled Meds: . aspirin  81 mg Oral Daily  . Chlorhexidine Gluconate Cloth  6 each Topical Q0600  . doxycycline  100 mg Oral Q12H  . ferrous sulfate  325 mg Oral BID WC  . loratadine  10 mg Oral Daily  . metoprolol tartrate  50 mg Oral BID  . mupirocin ointment  1 application Nasal BID  . pantoprazole  40 mg Oral Daily  . polyethylene glycol  17 g Oral BID  . simvastatin  40 mg Oral QHS   Continuous Infusions: . heparin 1,800 Units/hr (12/01/16 0533)   PRN Meds: acetaminophen, ondansetron (ZOFRAN) IV   Vital Signs    Vitals:   12/01/16 0455 12/01/16 0800 12/01/16 0805 12/01/16 0856  BP: 107/72 99/72 99/72  (!) 87/70  Pulse:  (!) 142 (!) 150 (!) 126  Resp:  (!) 22  (!) 26  Temp: 97.8 F (36.6 C)     TempSrc: Oral     SpO2:  90%  96%  Weight:      Height:        Intake/Output Summary (Last 24 hours) at 12/01/2016 0903 Last data filed at 12/01/2016 0800 Gross per 24 hour  Intake 1134 ml  Output 600 ml  Net 534 ml   Filed Weights   11/28/16 0532 11/29/16 0532 11/30/16 0400  Weight: 187 lb 6.3 oz (85 kg) 190 lb 0.6 oz (86.2 kg) 191 lb 2.2 oz (86.7 kg)    Telemetry    A fib rate 150 at times,  This AM, but usually at 95-110.  - Personally Reviewed  ECG    No new - Personally Reviewed  Physical Exam   GEN: No acute distress.   Neck: No JVD Cardiac: irreg irreg, no murmurs, rubs, or gallops.  Respiratory: Clear to auscultation bilaterally. GI: Soft, nontender, non-distended  MS: No edema; No deformity. Neuro:  Nonfocal  Psych: Normal affect   Labs    Chemistry Recent Labs  Lab 11/27/16 1357 11/30/16 1347 12/01/16 0426  NA 135 133* 136  K 3.8 3.5 3.3*  CL 99* 100* 103  CO2 23 23 25   GLUCOSE 89 132* 108*  BUN 11 12 11   CREATININE 0.88 0.72 0.70  CALCIUM 8.6*  8.0* 7.8*  PROT  --  5.4*  --   ALBUMIN  --  2.1*  --   AST  --  31  --   ALT  --  10*  --   ALKPHOS  --  69  --   BILITOT  --  1.5*  --   GFRNONAA >60 >60 >60  GFRAA >60 >60 >60  ANIONGAP 13 10 8      Hematology Recent Labs  Lab 11/29/16 0517 11/30/16 0447 12/01/16 0426  WBC 4.2 4.5 4.0  RBC 2.89* 2.99* 2.82*  HGB 7.8* 8.0* 7.7*  HCT 25.6* 26.3* 25.0*  MCV 88.6 88.0 88.7  MCH 27.0 26.8 27.3  MCHC 30.5 30.4 30.8  RDW 16.8* 16.7* 16.8*  PLT 193 177 151    Cardiac Enzymes Recent Labs  Lab 11/27/16 2130 11/30/16 2122 12/01/16 0426  TROPONINI <0.03 <0.03 <0.03   No results for input(s): TROPIPOC in the last 168 hours.   BNPNo results for input(s): BNP, PROBNP in the last 168 hours.   DDimer No results for input(s): DDIMER  in the last 168 hours.   Radiology    Dg Chest 2 View  Result Date: 11/30/2016 CLINICAL DATA:  Fever today. EXAM: CHEST  2 VIEW COMPARISON:  CT chest 11/27/2016.  Chest radiograph 11/27/2016. FINDINGS: Mild cardiac enlargement. No vascular congestion. No airspace disease or consolidation in the lungs. No blunting of costophrenic angles. No pneumothorax. Calcification of the aorta. Prominent lymphadenopathy demonstrated at CT is not well visualized radiographically. IMPRESSION: Cardiac enlargement.  No evidence of active pulmonary disease. Electronically Signed   By: Lucienne Capers M.D.   On: 11/30/2016 23:36    Cardiac Studies   Echocardiogram 11/29/2016  - Left ventricle: Wall thickness was increased in a pattern of moderate LVH. Systolic function was normal. The estimated ejection fraction was in the range of 55% to 60%. Wall motion was normal; there were no regional wall motion abnormalities. The study is not technically sufficient to allow evaluation of LV diastolic function. - Aortic valve: Trileaflet. Sclerosis without stenosis. There was trivial regurgitation. - Mitral valve: Mildly thickened leaflets . There was  trivial regurgitation. - Left atrium: Moderately dilated. - Right ventricle: The cavity size was mildly dilated. Low normal systolic function. - Right atrium: Moderately dilated. - Tricuspid valve: There was mild regurgitation. - Pulmonary arteries: PA peak pressure: 35 mm Hg (S). - Inferior vena cava: The vessel was normal in size. The respirophasic diameter changes were in the normal range (>= 50%), consistent with normal central venous pressure. - Pericardium, extracardiac: A trivial pericardial effusion was identified posterior to the heart. Features were not consistent with tamponade physiology.  Impressions:  - LVEF 55-60%, moderate LVH, normal wall motion, aortic valve sclerosis with trivial AI, moderate biatrial enlargement, mild RVE with low normal RV systolic function, mild TR, RVSP 35 mmHg, normal IVC, trivial posterior pericardial effusion.     Patient Profile     66 y.o. male with known history of hypertension, hyperlipidemia, CVA with new onset atrial fib with RVR and anemia this admit.    Assessment & Plan    A fib with RVR   Metoprolol has been increased 50 mg BID.  dilt restarted but with soft BP had to stop.  With exertion HR up.   EF is normal.    Anticoagulation -CHA2ds2vasc is 6 ,   IV heparin started yesterday with plan for NOAC at discharge --H/H down slightly from yesterday.     Hypokalemia at 3.3 today - replace will give 40 po  Left cervical lymphadenopathy with recent biopsy pr IR  Per IM  Anemia -oncology has seen , awaiting biopsy results.  Monitor closely with IV heparin.  Fever to 101 last evening.  WBC 4.0  Started doxy    For questions or updates, please contact [place] Please consult [email or web address] for contact info under Cardiology/STEMI.      Signed, Cecilie Kicks, NP  12/01/2016, 9:03 AM

## 2016-12-01 NOTE — Progress Notes (Signed)
ANTICOAGULATION CONSULT NOTE - Follow Up Consult  Pharmacy Consult for heparin Indication: atrial fibrillation   Labs: Recent Labs    11/28/16 0550 11/29/16 0517 11/30/16 0447 11/30/16 1347 11/30/16 1857 11/30/16 2122 12/01/16 0426  HGB 7.5* 7.8* 8.0*  --   --   --  7.7*  HCT 24.0* 25.6* 26.3*  --   --   --  25.0*  PLT 177 193 177  --   --   --  151  HEPARINUNFRC <0.10*  --   --   --  <0.10*  --  <0.10*  CREATININE  --   --   --  0.72  --   --  0.70  TROPONINI  --   --   --   --   --  <0.03 <0.03    Assessment: 66yo male remains undetectable on heparin after rate increases; no gtt issues or signs of bleeding per RN.  Goal of Therapy:  Heparin level 0.3-0.7 units/ml   Plan:  Will increase heparin gtt by 3-4 units/kg/hr to 1800 units/hr and check level in 6hr.  Wynona Neat, PharmD, BCPS  12/01/2016,5:30 AM

## 2016-12-01 NOTE — Plan of Care (Signed)
Pt uses call light appropriately and bed alarm is on. A fib slightly more controlled at this time but med was changed from Metoprolol to Cardizem, will continue to monitor.

## 2016-12-01 NOTE — Progress Notes (Signed)
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  If patient is discharged I will arrange for follow-up in our cinic; otherwise will see in the hospital when final pathology available

## 2016-12-01 NOTE — Plan of Care (Signed)
Pt continues to run elevated temps and HR continues to be elevated, notified MD and will continue to monitor. Pt has some slowness mentally and needs more simple instructions and reassurance, will continue to assist and monitor,

## 2016-12-01 NOTE — Progress Notes (Signed)
Text paged Dr Maudie Mercury who is covering for [facility] re: elevated temp of 101 orally, will give Tylenol and continue to monitor, also let him know that his HR remains elevated 110-140's on the monitor but his pressures are soft in the 90's/50-60's. Dr Maudie Mercury put in orders as we were speaking to start IVF's of NS to run at 75cc/hr, will get labwork and a CXR and NT instructed to change out his condom catheter so that we can send a urine specimen, will continue to monitor. He also wrote orders to give IV Lopressor 5 mg but will hold off for now D/T low B/P.

## 2016-12-01 NOTE — NC FL2 (Signed)
[place] LEVEL OF CARE SCREENING TOOL     IDENTIFICATION  Patient Name: [name] [name] Birthdate: 08/08/1950 Sex: male Admission Date (Current Location): 11/27/2016  [facility] and [place] Number:  Herbalist and Address:  [facility] [place], [place] [address] [place], [place] [address]      Provider Number: [id number]  Attending Physician Name and Address:  Mariel Aloe, MD  Relative Name and Phone Number:  Charyl Dancer, friend, [phone]    Current Level of Care: Hospital Recommended Level of Care: [place] Prior Approval Number:    Date Approved/Denied:   PASRR Number: [id number]  Discharge Plan: SNF    Current Diagnoses: Patient Active Problem List   Diagnosis Date Noted  . Atrial fibrillation with RVR ([place]) 11/27/2016  . Normocytic anemia 11/27/2016  . Lymphadenopathy 11/27/2016  . Hyperlipidemia 10/24/2013  . 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 Constipation 08/05/2012  . Hyperglycemia 08/05/2012  . 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 HEMORRHAGE OF RECTUM AND ANUS 05/17/2008  . ABDOMINAL PAIN, LEFT LOWER QUADRANT 05/17/2008  . DYSLIPIDEMIA 04/07/2008  . 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 Essential hypertension 04/07/2008  . DIVERTICULOSIS, MILD 04/07/2008    Orientation RESPIRATION BLADDER Height & Weight     Self, Time, Situation, Place  Normal Incontinent, External catheter Weight: 191 lb 2.2 oz (86.7 kg) Height:  5\' 11"  (180.3 cm)  BEHAVIORAL SYMPTOMS/MOOD NEUROLOGICAL BOWEL NUTRITION STATUS      Continent Diet(heart diet; please see DC summary)  AMBULATORY STATUS COMMUNICATION OF NEEDS Skin   (baseline) Verbally Normal                       Personal Care Assistance Level of Assistance  Bathing, Feeding, Dressing(baseline)           Functional Limitations Info  Sight, Hearing, Speech Sight Info: Adequate Hearing Info: Adequate Speech Info: Adequate    SPECIAL CARE  FACTORS FREQUENCY                       Contractures Contractures Info: Not present    Additional Factors Info  Code Status, Allergies Code Status Info: Full Allergies Info: No Known Allergies           Current Medications (12/01/2016):  This is the current hospital active medication list Current Facility-Administered Medications  Medication Dose Route Frequency Provider Last Rate Last Dose  . acetaminophen (TYLENOL) tablet 650 mg  650 mg Oral Q4H PRN Karmen Bongo, MD   650 mg at 11/30/16 2052  . aspirin chewable tablet 81 mg  81 mg Oral Daily Karmen Bongo, MD   81 mg at 12/01/16 8469  . Chlorhexidine Gluconate Cloth 2 % PADS 6 each  6 each Topical Q0600 Karmen Bongo, MD   6 each at 12/01/16 0503  . doxycycline (VIBRA-TABS) tablet 100 mg  100 mg Oral Q12H Mariel Aloe, MD   100 mg at 12/01/16 0805  . ferrous sulfate tablet 325 mg  325 mg Oral BID WC Karmen Bongo, MD   325 mg at 12/01/16 0805  . heparin ADULT infusion 100 units/mL (25000 units/242mL sodium chloride 0.45%)  1,800 Units/hr Intravenous Continuous Laren Everts, RPH 18 mL/hr at 12/01/16 0533 1,800 Units/hr at 12/01/16 0533  . loratadine (CLARITIN) tablet 10 mg  10 mg Oral Daily Karmen Bongo, MD   10 mg at 12/01/16 0805  . metoprolol tartrate (LOPRESSOR) tablet 50 mg  50  mg Oral BID Mariel Aloe, MD   50 mg at 12/01/16 9597  . mupirocin ointment (BACTROBAN) 2 % 1 application  1 application Nasal BID Karmen Bongo, MD   1 application at 47/18/55 [phone]  . ondansetron (ZOFRAN) injection 4 mg  4 mg Intravenous Q6H PRN Karmen Bongo, MD      . pantoprazole (PROTONIX) EC tablet 40 mg  40 mg Oral Daily Karmen Bongo, MD   40 mg at 12/01/16 0806  . polyethylene glycol (MIRALAX / GLYCOLAX) packet 17 g  17 g Oral BID Mariel Aloe, MD   17 g at 12/01/16 0807  . simvastatin (ZOCOR) tablet 40 mg  40 mg Oral QHS Karmen Bongo, MD   40 mg at 11/30/16 2252     Discharge Medications: Please see  discharge summary for a list of discharge medications.  Relevant Imaging Results:  Relevant Lab Results:   Additional Information SSN: [id number]  Estanislado Emms, LCSW

## 2016-12-01 NOTE — Progress Notes (Signed)
Dr Maudie Mercury called RE: increased pain in right upper arm from the elbow up, brachial pulses palpable and temp now down to 99.5. Pt states that it feels like pins/needles but circulation, temp, color and sensation remain WNL. Dr Maudie Mercury gave an order for a one time dose of Tramadol since his B/P is still a little soft, will give when cleared and continue to monitor.

## 2016-12-01 NOTE — Progress Notes (Signed)
ANTICOAGULATION CONSULT NOTE Pharmacy Consult for Heparin Indication: atrial fibrillation  No Known Allergies  Patient Measurements: Height: 5\' 11"  (180.3 cm) Weight: 191 lb 2.2 oz (86.7 kg) IBW/kg (Calculated) : 75.3 Heparin Dosing Weight: 86kg  Vital Signs: Temp: 97.8 F (36.6 C) (11/19 0455) Temp Source: Oral (11/19 0455) BP: 95/68 (11/19 1251) Pulse Rate: 126 (11/19 1251)  Labs: Recent Labs    11/29/16 0517 11/30/16 0447 11/30/16 1347 11/30/16 1857 11/30/16 2122 12/01/16 0426 12/01/16 0813 12/01/16 1128  HGB 7.8* 8.0*  --   --   --  7.7*  --   --   HCT 25.6* 26.3*  --   --   --  25.0*  --   --   PLT 193 177  --   --   --  151  --   --   HEPARINUNFRC  --   --   --  <0.10*  --  <0.10*  --  <0.10*  CREATININE  --   --  0.72  --   --  0.70  --   --   TROPONINI  --   --   --   --  <0.03 <0.03 <0.03  --     Estimated Creatinine Clearance: 96.7 mL/min (by C-G formula based on SCr of 0.7 mg/dL).   Medical History: Past Medical History:  Diagnosis Date  . Bladder tumor   . Cataract   . CVA (cerebral infarction)    2008  . Hyperlipidemia   . 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  Pharmacy consulted for heparin for atrial fibrillation. No PTA AC. Plans noted for DOAC closer to discharge.  -heparin level undetectable, no further signs of bleeding noted -FOBT pending, hg= 7.7 (low/stable)  Goal of Therapy:  Heparin level 0.3-0.5 Monitor platelets by anticoagulation protocol: Yes   Plan:  Increase heparin infusion to 2050 units/hr (no bolus) 6 hour heparin level Daily heparin level, CBC  Hildred Laser, Pharm D 12/01/2016 1:13 PM

## 2016-12-01 NOTE — Progress Notes (Signed)
ANTICOAGULATION CONSULT NOTE - FOLLOW UP    HL = 0.11 (goal 0.3 - 0.5 units/mL) Heparin dosing weight = 86 kg   Assessment: 66 YOM continues on IV heparin for Afib with RVR.  Heparin level is sub-therapeutic but has trended up.  RN reported no further bleeding nor issue with heparin infusion.  Per MD's note, patient haptoglobin doesn't support hemolysis.   Plan: Increase heparin gtt to 2300 units/hr Check 6 hr heparin level Monitor closely for s/sx of bleeding   Damian Hofstra D. Mina Marble, PharmD, BCPS 12/01/2016, 8:32 PM

## 2016-12-01 NOTE — Progress Notes (Signed)
PROGRESS NOTE    [name] [name]  UTM:[id number] DOB: 04/19/1950 DOA: 11/27/2016 PCP: Wenda Low, MD   Brief Narrative: [name] [name] is a 66 y.o. male with medical history significant of HTN, HLD, CVA, CKD, and CHF presenting with afib. He was admitted for atrial fibrillation with RVR while attempting to undergo a core biopsy. Atrial fibrillation initially controlled on diltiazem drip but has not been controlled on metoprolol.    Assessment & Plan:   Principal Problem:   Atrial fibrillation with RVR (HCC) Active Problems:   Mild intellectual disability   Essential hypertension   Normocytic anemia   Lymphadenopathy   Atrial fibrillation with RVR Patient started on Cardizem drip and heparin drip which have been discontinued. Echocardiogram significant for moderate biatrial enlargement, trivial pericardial effusion, EF if 55-60% with no evidence of wall motion abnormalities. Continues to have significant tachycardia on beta-blocker regimen. -cardiology recommendations today  Anemia of chronic disease Patient with hematuria, hematochezia and finds blood in his mouth at times. Unknown etiology. INR normal. Hemoglobin slightly down today. Chart review shows problem of bladder neoplasm and history of rectal bleeding. Colonoscopy three years ago significant for a polyp in sigmoid colon and colonic polyposis. Flexible sigmoidoscopy 2 years ago 5 cm pedunculated polyp. Pathology included results of tubular adenoma and lipoma. 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 Hemoglobin stable. -FOBT pending (no bowel movement)  Left cervical lymphadenopathy IR performed biopsy (11/28/16). PCP can follow-up results. Area with some bruising.   Essential hypertension Normotensive. -metoprolol per cardiology  History of CVA Hyperlipidemia -continue Simvastatin  Constipation -Miralax  Mild intellectual disability  Fever No leukocytosis.  Patient asymptomatic. Biopsy area with overlying ecchymosis and is without erythema but area does have some fluctuance and appears to be enlarging -blood cultures no growth to date -start Doxycycline -Ultrasound to rule in/out abscess   DVT prophylaxis: SCDs Code Status: Full code Family Communication: None at bedside Disposition Plan: Discharge to SNF when medically stable   Consultants:   Cardiology  Interventional radiology  Procedures:   Lymph node biopsy (11/16)  Antimicrobials:  None    Subjective: Afebrile. No palpitations or chest pain.  Objective: Vitals:   11/30/16 2310 12/01/16 0455 12/01/16 0800 12/01/16 0805  BP: 102/64 107/72 99/72 99/72   Pulse: (!) 102  (!) 142 (!) 150  Resp: 20  (!) 22   Temp:  97.8 F (36.6 C)    TempSrc:  Oral    SpO2: 93%  90%   Weight:      Height:        Intake/Output Summary (Last 24 hours) at 12/01/2016 0850 Last data filed at 12/01/2016 0800 Gross per 24 hour  Intake 1134 ml  Output 600 ml  Net 534 ml   Filed Weights   11/28/16 0532 11/29/16 0532 11/30/16 0400  Weight: 85 kg (187 lb 6.3 oz) 86.2 kg (190 lb 0.6 oz) 86.7 kg (191 lb 2.2 oz)    Examination:  General exam: Appears calm and comfortable HEENT: right cervical neck adenopathy without tenderness and overlying ecchymosis Respiratory system: Clear to auscultation bilaterally. Unlabored work of breathing. No wheezing or rales. Cardiovascular system: S1 & S2 heard, Irregular rhythm, fast rate. No murmurs, rubs, gallops or clicks. Gastrointestinal system: Abdomen is nondistended, soft and nontender. No organomegaly or masses felt. Normal bowel sounds heard. Central nervous system: Alert and oriented. No focal neurological deficits. Extremities: No edema. No calf tenderness. Right arm is significantly enlarged compared to left with no  tenderness Skin: No cyanosis. No rashes Psychiatry: Judgement and insight appear normal. Mood & affect appropriate.      Data Reviewed: I have personally reviewed following labs and imaging studies  CBC: Recent Labs  Lab 11/27/16 1357 11/28/16 0550 11/29/16 0517 11/30/16 0447 12/01/16 0426  WBC 4.8 3.9* 4.2 4.5 4.0  HGB 8.6* 7.5* 7.8* 8.0* 7.7*  HCT 27.6* 24.0* 25.6* 26.3* 25.0*  MCV 87.6 87.9 88.6 88.0 88.7  PLT 194 177 193 177 761   Basic Metabolic Panel: Recent Labs  Lab 11/27/16 1357 11/27/16 1515 11/30/16 1347 12/01/16 0426  NA 135  --  133* 136  K 3.8  --  3.5 3.3*  CL 99*  --  100* 103  CO2 23  --  23 25  GLUCOSE 89  --  132* 108*  BUN 11  --  12 11  CREATININE 0.88  --  0.72 0.70  CALCIUM 8.6*  --  8.0* 7.8*  MG  --  2.0  --   --    GFR: Estimated Creatinine Clearance: 96.7 mL/min (by C-G formula based on SCr of 0.7 mg/dL). Liver Function Tests: Recent Labs  Lab 11/30/16 1347  AST 31  ALT 10*  ALKPHOS 69  BILITOT 1.5*  PROT 5.4*  ALBUMIN 2.1*   No results for input(s): LIPASE, AMYLASE in the last 168 hours. No results for input(s): AMMONIA in the last 168 hours. Coagulation Profile: Recent Labs  Lab 11/27/16 1150 11/27/16 1515  INR 1.21 1.23   Cardiac Enzymes: Recent Labs  Lab 11/27/16 2130 11/30/16 2122 12/01/16 0426  TROPONINI <0.03 <0.03 <0.03   BNP (last 3 results) No results for input(s): PROBNP in the last 8760 hours. HbA1C: No results for input(s): HGBA1C in the last 72 hours. CBG: No results for input(s): GLUCAP in the last 168 hours. Lipid Profile: No results for input(s): CHOL, HDL, LDLCALC, TRIG, CHOLHDL, LDLDIRECT in the last 72 hours. Thyroid Function Tests: No results for input(s): TSH, T4TOTAL, FREET4, T3FREE, THYROIDAB in the last 72 hours. Anemia Panel: Recent Labs    11/28/16 1429 11/28/16 1438  VITAMINB12  --  638  FOLATE 14.0  --   FERRITIN  --  392*  TIBC  --  190*  IRON  --  15*  RETICCTPCT  --  3.8*   Sepsis Labs: No results for input(s): PROCALCITON, LATICACIDVEN in the last 168 hours.  Recent Results (from  the past 240 hour(s))  MRSA PCR Screening     Status: Abnormal   Collection Time: 11/27/16  9:53 PM  Result Value Ref Range Status   MRSA by PCR POSITIVE (A) NEGATIVE Final    Comment:        The GeneXpert MRSA Assay (FDA approved for NASAL specimens only), is one component of a comprehensive MRSA colonization surveillance program. It is not intended to diagnose MRSA infection nor to guide or monitor treatment for MRSA infections. RESULT CALLED TO, READ BACK BY AND VERIFIED WITH: RN Madolyn Frieze [phone] MLM   Culture, blood (routine x 2)     Status: None (Preliminary result)   Collection Time: 11/29/16 12:18 AM  Result Value Ref Range Status   Specimen Description BLOOD LEFT ANTECUBITAL  Final   Special Requests IN PEDIATRIC BOTTLE Blood Culture adequate volume  Final   Culture NO GROWTH 1 DAY  Final   Report Status PENDING  Incomplete  Culture, blood (routine x 2)     Status: None (Preliminary result)   Collection Time: 11/29/16  12:18 AM  Result Value Ref Range Status   Specimen Description BLOOD LEFT HAND  Final   Special Requests IN PEDIATRIC BOTTLE Blood Culture adequate volume  Final   Culture NO GROWTH 1 DAY  Final   Report Status PENDING  Incomplete         Radiology Studies: Dg Chest 2 View  Result Date: 11/30/2016 CLINICAL DATA:  Fever today. EXAM: CHEST  2 VIEW COMPARISON:  CT chest 11/27/2016.  Chest radiograph 11/27/2016. FINDINGS: Mild cardiac enlargement. No vascular congestion. No airspace disease or consolidation in the lungs. No blunting of costophrenic angles. No pneumothorax. Calcification of the aorta. Prominent lymphadenopathy demonstrated at CT is not well visualized radiographically. IMPRESSION: Cardiac enlargement.  No evidence of active pulmonary disease. Electronically Signed   By: Lucienne Capers M.D.   On: 11/30/2016 23:36        Scheduled Meds: . aspirin  81 mg Oral Daily  . Chlorhexidine Gluconate Cloth  6 each Topical Q0600  .  doxycycline  100 mg Oral Q12H  . ferrous sulfate  325 mg Oral BID WC  . loratadine  10 mg Oral Daily  . metoprolol tartrate  50 mg Oral BID  . mupirocin ointment  1 application Nasal BID  . pantoprazole  40 mg Oral Daily  . polyethylene glycol  17 g Oral BID  . simvastatin  40 mg Oral QHS   Continuous Infusions: . heparin 1,800 Units/hr (12/01/16 0533)     LOS: 4 days     Cordelia Poche, MD [facility] 12/01/2016, 8:50 AM Pager: [phone]  If 7PM-7AM, please contact night-coverage [email or web address] Password [email or web address] 12/01/2016, 8:50 AM

## 2016-12-01 NOTE — Progress Notes (Signed)
ANTICOAGULATION CONSULT NOTE Pharmacy Consult for Heparin Indication: atrial fibrillation  No Known Allergies  Patient Measurements: Height: 5\' 11"  (180.3 cm) Weight: 191 lb 2.2 oz (86.7 kg) IBW/kg (Calculated) : 75.3 Heparin Dosing Weight: 86kg  Vital Signs: Temp: 97.8 F (36.6 C) (11/19 0455) Temp Source: Oral (11/19 0455) BP: 95/68 (11/19 1251) Pulse Rate: 126 (11/19 1251)  Labs: Recent Labs    11/29/16 0517 11/30/16 0447 11/30/16 1347 11/30/16 1857 11/30/16 2122 12/01/16 0426 12/01/16 0813 12/01/16 1128  HGB 7.8* 8.0*  --   --   --  7.7*  --   --   HCT 25.6* 26.3*  --   --   --  25.0*  --   --   PLT 193 177  --   --   --  151  --   --   HEPARINUNFRC  --   --   --  <0.10*  --  <0.10*  --  <0.10*  CREATININE  --   --  0.72  --   --  0.70  --   --   TROPONINI  --   --   --   --  <0.03 <0.03 <0.03  --     Estimated Creatinine Clearance: 96.7 mL/min (by C-G formula based on SCr of 0.7 mg/dL).   Medical History: Past Medical History:  Diagnosis Date  . Bladder tumor   . Cataract   . CVA (cerebral infarction)    2008  . Hyperlipidemia   . 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  Pharmacy consulted for heparin for atrial fibrillation. No PTA AC. Plans noted for DOAC closer to discharge.  -heparin level undetectable, no further signs of bleeding noted -FOBT pending, hg= 7.7 (low/stable)  Goal of Therapy:  Heparin level 0.3-0.7 units/ml Monitor platelets by anticoagulation protocol: Yes   Plan:  Increase heparin infusion to 2050 units/hr (no bolus) 6 hour heparin level Daily heparin level, CBC  Hildred Laser, Pharm D 12/01/2016 1:10 PM

## 2016-12-02 ENCOUNTER — Ambulatory Visit (HOSPITAL_COMMUNITY): Payer: Medicare Other

## 2016-12-02 DIAGNOSIS — C8310 Mantle cell lymphoma, unspecified site: Secondary | ICD-10-CM

## 2016-12-02 DIAGNOSIS — C8315 Mantle cell lymphoma, lymph nodes of inguinal region and lower limb: Secondary | ICD-10-CM

## 2016-12-02 DIAGNOSIS — E7800 Pure hypercholesterolemia, unspecified: Secondary | ICD-10-CM

## 2016-12-02 LAB — CBC
HEMATOCRIT: 23.7 % — AB (ref 39.0–52.0)
Hemoglobin: 7.4 g/dL — ABNORMAL LOW (ref 13.0–17.0)
MCH: 27.7 pg (ref 26.0–34.0)
MCHC: 31.2 g/dL (ref 30.0–36.0)
MCV: 88.8 fL (ref 78.0–100.0)
PLATELETS: 167 10*3/uL (ref 150–400)
RBC: 2.67 MIL/uL — ABNORMAL LOW (ref 4.22–5.81)
RDW: 17.3 % — AB (ref 11.5–15.5)
WBC: 4.5 10*3/uL (ref 4.0–10.5)

## 2016-12-02 LAB — HEPARIN LEVEL (UNFRACTIONATED): Heparin Unfractionated: 0.24 IU/mL — ABNORMAL LOW (ref 0.30–0.70)

## 2016-12-02 LAB — ABO/RH: ABO/RH(D): A POS

## 2016-12-02 LAB — PREPARE RBC (CROSSMATCH)

## 2016-12-02 MED ORDER — SODIUM CHLORIDE 0.9% FLUSH: 3.0000 mL | INTRAVENOUS | Status: DC | PRN

## 2016-12-02 MED ORDER — HEPARIN SOD (PORK) LOCK FLUSH 100 UNIT/ML IV SOLN
500.0000 [IU] | Freq: Every day | INTRAVENOUS | Status: DC | PRN
  Filled 2016-12-02: qty 5

## 2016-12-02 MED ORDER — DILTIAZEM HCL ER COATED BEADS 240 MG PO CP24
240.0000 mg | ORAL_CAPSULE | Freq: Every day | ORAL | Status: DC
  Administered 2016-12-02 – 2016-12-06 (×5): 240 mg via ORAL
  Filled 2016-12-02 (×5): qty 1

## 2016-12-02 MED ORDER — HEPARIN SOD (PORK) LOCK FLUSH 100 UNIT/ML IV SOLN
250.0000 [IU] | INTRAVENOUS | Status: DC | PRN
  Filled 2016-12-02: qty 3

## 2016-12-02 MED ORDER — PREDNISONE 20 MG PO TABS
50.0000 mg | ORAL_TABLET | Freq: Every day | ORAL | Status: DC
  Administered 2016-12-03 – 2016-12-06 (×4): 50 mg via ORAL
  Filled 2016-12-02 (×4): qty 1

## 2016-12-02 MED ORDER — SODIUM CHLORIDE 0.9 % IV SOLN: 250.0000 mL | Freq: Once | INTRAVENOUS | Status: DC

## 2016-12-02 MED ORDER — SODIUM CHLORIDE 0.9% FLUSH: 10.0000 mL | INTRAVENOUS | Status: DC | PRN

## 2016-12-02 MED ORDER — SODIUM CHLORIDE 0.9 % IV SOLN: 375.0000 mg/m2 | Freq: Once | INTRAVENOUS | Status: DC

## 2016-12-02 NOTE — Clinical Social Work Note (Signed)
Clinical Social Work Assessment  Patient Details  Name: [name] [name] MRN: [id number] Date of Birth: Feb 08, 1950  Date of referral:  12/02/16               Reason for consult:  Facility Placement(from [facility] long term [facility])                Permission sought to share information with:  Facility Sport and exercise psychologist, Family Supports Permission granted to share information::  Yes, Verbal Permission Granted  Name::     Winnie Loftis  Agency::  [facility]  Relationship::  friend  Sport and exercise psychologist Information:  [phone]  Housing/Transportation Living arrangements for the past 2 months:  [place] of Information:  Facility, Patient Patient Interpreter Needed:  None Criminal Activity/Legal Involvement Pertinent to Current Situation/Hospitalization:  No - Comment as needed Significant Relationships:  Friend Lives with:  Facility Resident Do you feel safe going back to the place where you live?  Yes Need for family participation in patient care:  No (Coment)  Care giving concerns: Patient is long term care at [facility]. Will return at discharge.   Social Worker assessment / plan: CSW met with patient at bedside. Patient reported he has been at [facility] since having a stroke in 2008. Patient agreeable to return to SNF at discharge. Patient requested CSW call friend, Hulan Amato, when patient is discharged. CSW spoke to admissions at [facility] and they are ready to take patient back when medically stable. CSW to follow and support with discharge.  Employment status:  Disabled (Comment on whether or not currently receiving Disability) Insurance information:  Medicare, Medicaid In [place] PT Recommendations:  Not assessed at this time Information / Referral to community resources:  [place]  Patient/Family's Response to care: Patient appreciative of care.  Patient/Family's Understanding of and Emotional Response to Diagnosis, Current Treatment, and Prognosis: Patient  understanding of his conditions and agreeable to return to SNF.  Emotional Assessment Appearance:  Appears stated age Attitude/Demeanor/Rapport:  Other(appropriate) Affect (typically observed):  Calm, Pleasant Orientation:  Oriented to Self, Oriented to Place, Oriented to  Time, Oriented to Situation Alcohol / Substance use:  Not Applicable Psych involvement (Current and /or in the community):  No (Comment)  Discharge Needs  Concerns to be addressed:  Discharge Planning Concerns Readmission within the last 30 days:  No Current discharge risk:  Physical Impairment Barriers to Discharge:  Continued Medical Work up   Estanislado Emms, LCSW 12/02/2016, 11:06 AM

## 2016-12-02 NOTE — Progress Notes (Addendum)
PROGRESS NOTE    [name] [name]  NTI:[id number] DOB: Aug 31, 1950 DOA: 11/27/2016 PCP: Wenda Low, MD   Brief Narrative: [name] [name] is a 66 y.o. male with medical history significant of HTN, HLD, CVA, CKD presenting with afib. He was admitted for atrial fibrillation with RVR while attempting to undergo a core biopsy. Atrial fibrillation initially controlled on diltiazem drip but has not been controlled on metoprolol.    Assessment & Plan:   Principal Problem:   Atrial fibrillation with RVR (HCC) Active Problems:   Mild intellectual disability   Essential hypertension   Constipation   Hyperlipidemia   Normocytic anemia   Lymphadenopathy   Atrial fibrillation with RVR Patient started on Cardizem drip and heparin drip which have been discontinued. Echocardiogram significant for moderate biatrial enlargement, trivial pericardial effusion, EF if 55-60% with no evidence of wall motion abnormalities. Continues to have significant tachycardia on beta-blocker regimen. -Cardiology recommendations: converting to Cardizem 260 mg  Anemia of chronic disease Patient with hematuria, hematochezia and finds blood in his mouth at times. Unknown etiology. INR normal. Hemoglobin slightly down today. Chart review shows problem of bladder neoplasm and history of rectal bleeding. Colonoscopy three years ago significant for a polyp in sigmoid colon and colonic polyposis. Flexible sigmoidoscopy 2 years ago 5 cm pedunculated polyp. Pathology included results of tubular adenoma and lipoma. 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 Hemoglobin trended down. FOBT negative. -transfuse for hemoglobin <7 -cardiology transitioning to Aspirin. Hematology to address safety for anticoagulation per cardiology recommendation  Left cervical lymphadenopathy IR performed biopsy (11/28/16). PCP can follow-up results.  Essential  hypertension Normotensive. -Cardizem  History of CVA Hyperlipidemia -continue Simvastatin  Constipation -Miralax  Mild intellectual disability  Fever No leukocytosis. Patient asymptomatic. Biopsy area with overlying ecchymosis and is with mild erythema. Ultrasound negative for abscess. With likely diagnosis of non-hodgkin lymphoma, possible this could be secondary. Blood cultures no growth to date -continue Doxycycline   DVT prophylaxis: SCDs Code Status: Full code Family Communication: None at bedside Disposition Plan: Discharge to SNF when medically stable   Consultants:   Cardiology  Interventional radiology  Procedures:   Lymph node biopsy (11/16)  Antimicrobials:  None    Subjective: Afebrile. No palpitations or chest pain.  Objective: Vitals:   12/01/16 2353 12/01/16 2355 12/02/16 0556 12/02/16 0700  BP: 101/65  118/73 117/83  Pulse:    100  Resp:    16  Temp:  98.7 F (37.1 C)  97.9 F (36.6 C)  TempSrc:  Oral  Oral  SpO2:    97%  Weight:      Height:        Intake/Output Summary (Last 24 hours) at 12/02/2016 1011 Last data filed at 12/02/2016 0938 Gross per 24 hour  Intake 1915.75 ml  Output 710 ml  Net 1205.75 ml   Filed Weights   11/28/16 0532 11/29/16 0532 11/30/16 0400  Weight: 85 kg (187 lb 6.3 oz) 86.2 kg (190 lb 0.6 oz) 86.7 kg (191 lb 2.2 oz)    Examination:  General exam: Appears calm and comfortable HEENT: right cervical neck adenopathy without tenderness and overlying mild erythema Respiratory system: Clear to auscultation bilaterally. Unlabored work of breathing. No wheezing or rales. Cardiovascular system: S1 & S2 heard, Irregular rhythm, normal rate. No murmurs. Gastrointestinal system: Abdomen is nondistended, soft and nontender. No organomegaly or masses felt. Normal bowel sounds heard. Central nervous system: Alert and oriented. No focal neurological deficits. Extremities: No edema. No  calf tenderness. Right arm is  significantly enlarged compared to left with no tenderness Skin: No cyanosis. No rashes Psychiatry: Judgement and insight appear normal. Mood & affect appropriate.     Data Reviewed: I have personally reviewed following labs and imaging studies  CBC: Recent Labs  Lab 11/28/16 0550 11/29/16 0517 11/30/16 0447 12/01/16 0426 12/02/16 0327  WBC 3.9* 4.2 4.5 4.0 4.5  HGB 7.5* 7.8* 8.0* 7.7* 7.4*  HCT 24.0* 25.6* 26.3* 25.0* 23.7*  MCV 87.9 88.6 88.0 88.7 88.8  PLT 177 193 177 151 053   Basic Metabolic Panel: Recent Labs  Lab 11/27/16 1357 11/27/16 1515 11/30/16 1347 12/01/16 0426  NA 135  --  133* 136  K 3.8  --  3.5 3.3*  CL 99*  --  100* 103  CO2 23  --  23 25  GLUCOSE 89  --  132* 108*  BUN 11  --  12 11  CREATININE 0.88  --  0.72 0.70  CALCIUM 8.6*  --  8.0* 7.8*  MG  --  2.0  --   --    GFR: Estimated Creatinine Clearance: 96.7 mL/min (by C-G formula based on SCr of 0.7 mg/dL). Liver Function Tests: Recent Labs  Lab 11/30/16 1347  AST 31  ALT 10*  ALKPHOS 69  BILITOT 1.5*  PROT 5.4*  ALBUMIN 2.1*   No results for input(s): LIPASE, AMYLASE in the last 168 hours. No results for input(s): AMMONIA in the last 168 hours. Coagulation Profile: Recent Labs  Lab 11/27/16 1150 11/27/16 1515  INR 1.21 1.23   Cardiac Enzymes: Recent Labs  Lab 11/27/16 2130 11/30/16 2122 12/01/16 0426 12/01/16 0813  TROPONINI <0.03 <0.03 <0.03 <0.03   BNP (last 3 results) No results for input(s): PROBNP in the last 8760 hours. HbA1C: No results for input(s): HGBA1C in the last 72 hours. CBG: No results for input(s): GLUCAP in the last 168 hours. Lipid Profile: No results for input(s): CHOL, HDL, LDLCALC, TRIG, CHOLHDL, LDLDIRECT in the last 72 hours. Thyroid Function Tests: No results for input(s): TSH, T4TOTAL, FREET4, T3FREE, THYROIDAB in the last 72 hours. Anemia Panel: No results for input(s): VITAMINB12, FOLATE, FERRITIN, TIBC, IRON, RETICCTPCT in the last 72  hours. Sepsis Labs: No results for input(s): PROCALCITON, LATICACIDVEN in the last 168 hours.  Recent Results (from the past 240 hour(s))  MRSA PCR Screening     Status: Abnormal   Collection Time: 11/27/16  9:53 PM  Result Value Ref Range Status   MRSA by PCR POSITIVE (A) NEGATIVE Final    Comment:        The GeneXpert MRSA Assay (FDA approved for NASAL specimens only), is one component of a comprehensive MRSA colonization surveillance program. It is not intended to diagnose MRSA infection nor to guide or monitor treatment for MRSA infections. RESULT CALLED TO, READ BACK BY AND VERIFIED WITH: RN Madolyn Frieze [phone] MLM   Culture, blood (routine x 2)     Status: None (Preliminary result)   Collection Time: 11/29/16 12:18 AM  Result Value Ref Range Status   Specimen Description BLOOD LEFT ANTECUBITAL  Final   Special Requests IN PEDIATRIC BOTTLE Blood Culture adequate volume  Final   Culture NO GROWTH 2 DAYS  Final   Report Status PENDING  Incomplete  Culture, blood (routine x 2)     Status: None (Preliminary result)   Collection Time: 11/29/16 12:18 AM  Result Value Ref Range Status   Specimen Description BLOOD LEFT HAND  Final  Special Requests IN PEDIATRIC BOTTLE Blood Culture adequate volume  Final   Culture NO GROWTH 2 DAYS  Final   Report Status PENDING  Incomplete  Culture, blood (routine x 2)     Status: None (Preliminary result)   Collection Time: 11/30/16  9:25 PM  Result Value Ref Range Status   Specimen Description BLOOD LEFT HAND  Final   Special Requests   Final    BOTTLES DRAWN AEROBIC ONLY Blood Culture adequate volume   Culture NO GROWTH < 12 HOURS  Final   Report Status PENDING  Incomplete  Culture, blood (routine x 2)     Status: None (Preliminary result)   Collection Time: 11/30/16  9:30 PM  Result Value Ref Range Status   Specimen Description BLOOD LEFT WRIST  Final   Special Requests IN PEDIATRIC BOTTLE Blood Culture adequate volume  Final    Culture NO GROWTH < 12 HOURS  Final   Report Status PENDING  Incomplete         Radiology Studies: Dg Chest 2 View  Result Date: 11/30/2016 CLINICAL DATA:  Fever today. EXAM: CHEST  2 VIEW COMPARISON:  CT chest 11/27/2016.  Chest radiograph 11/27/2016. FINDINGS: Mild cardiac enlargement. No vascular congestion. No airspace disease or consolidation in the lungs. No blunting of costophrenic angles. No pneumothorax. Calcification of the aorta. Prominent lymphadenopathy demonstrated at CT is not well visualized radiographically. IMPRESSION: Cardiac enlargement.  No evidence of active pulmonary disease. Electronically Signed   By: Lucienne Capers M.D.   On: 11/30/2016 23:36   US Soft Tissue Neck  Result Date: 12/01/2016 CLINICAL DATA:  67 year old male with a history of neck mass, prior biopsy, possible infection EXAM: ULTRASOUND OF HEAD/NECK SOFT TISSUES TECHNIQUE: Ultrasound examination of the head and neck soft tissues was performed in the area of clinical concern. COMPARISON:  Ultrasound 11/28/2016, prior CT 11/27/2016 FINDINGS: Grayscale and color duplex ultrasound formed in the region of clinical concern. Multiple enlarged lymph nodes again evident, as previously demonstrated on ultrasound and CT imaging. No focal fluid collection. Post biopsy changes of targeted lymph node are evident without fluid collection, gas, or significant edema. IMPRESSION: Ultrasound survey in the region of clinical concern demonstrates no evidence of focal fluid collection or gas. Expected changes of prior biopsy of the targeted enlarged cervical lymph nodes. Electronically Signed   By: Corrie Mckusick D.O.   On: 12/01/2016 12:16        Scheduled Meds: . aspirin  81 mg Oral Daily  . Chlorhexidine Gluconate Cloth  6 each Topical Q0600  . diltiazem  240 mg Oral Daily  . doxycycline  100 mg Oral Q12H  . ferrous sulfate  325 mg Oral BID WC  . loratadine  10 mg Oral Daily  . mupirocin ointment  1 application  Nasal BID  . pantoprazole  40 mg Oral Daily  . polyethylene glycol  17 g Oral BID  . rosuvastatin  20 mg Oral q1800   Continuous Infusions:    LOS: 5 days     Cordelia Poche, MD [facility] 12/02/2016, 10:11 AM Pager: [phone]  If 7PM-7AM, please contact night-coverage [email or web address] Password [email or web address] 12/02/2016, 10:11 AM

## 2016-12-02 NOTE — Progress Notes (Signed)
ANTICOAGULATION CONSULT NOTE - FOLLOW UP  HL = 0.24 (goal 0.3 - 0.5 units/mL) Heparin dosing weight = 86 kg  Assessment: 66 YOM continues on IV heparin for Afib with RVR.    Heparin level subtherapeutic for low goal and no infusion issues per RN. CBC low-stable. No s/s bleeding per RN.   Plan: Increase heparin gtt to 2450 units/hr Heparin level in 6 hrs Daily heparin level and CBC Monitor for s/s bleeding   Lavonda Jumbo, PharmD Clinical Pharmacist 12/02/16 4:32 AM

## 2016-12-02 NOTE — Progress Notes (Signed)
[name] [name]   DOB:1950/07/04   GL#:[id number]   JJO#:[id number]  Subjective:  [name] [name] is pretty much unchanged from my prior visit. He denies nausea, altered taste of change in appetite. He tells me his BMs are brown, not black or bloody. He has complained of abdominal pain, mostly RUQ. No family in room  Objective: middle aged White man examined in bed Vitals:   12/02/16 1115 12/02/16 1141  BP: 99/65 100/74  Pulse: 97 95  Resp: 19 (!) 23  Temp:  98 F (36.7 C)  SpO2: 95% 96%    Body mass index is 26.66 kg/m.  Intake/Output Summary (Last 24 hours) at 12/02/2016 1901 Last data filed at 12/02/2016 1748 Gross per 24 hour  Intake 1710 ml  Output 750 ml  Net 960 ml     Sclerae unicteric  Cervical and supraclavicular adenopathy  Lungs no rales or wheezes--auscultated anterolaterally  Heart regular rate and rhythm tonight  Abdomen soft, +BS  Neuro nonfocal except for 5-/5 LLE flexion   CBG (last 3)  No results for input(s): GLUCAP in the last 72 hours.   Labs:  Lab Results  Component Value Date   WBC 4.5 12/02/2016   HGB 7.4 (L) 12/02/2016   HCT 23.7 (L) 12/02/2016   MCV 88.8 12/02/2016   PLT 167 12/02/2016    '@LASTCHEMISTRY'$ @  Urine Studies No results for input(s): UHGB, CRYS in the last 72 hours.  Invalid input(s): UACOL, UAPR, USPG, UPH, UTP, UGL, UKET, UBIL, UNIT, UROB, ULEU, UEPI, UWBC, URBC, UBAC, CAST, [place], [place]  Basic Metabolic Panel: Recent Labs  Lab 11/27/16 1357 11/27/16 1515 11/30/16 1347 12/01/16 0426  NA 135  --  133* 136  K 3.8  --  3.5 3.3*  CL 99*  --  100* 103  CO2 23  --  23 25  GLUCOSE 89  --  132* 108*  BUN 11  --  12 11  CREATININE 0.88  --  0.72 0.70  CALCIUM 8.6*  --  8.0* 7.8*  MG  --  2.0  --   --    GFR Estimated Creatinine Clearance: 96.7 mL/min (by C-G formula based on SCr of 0.7 mg/dL). Liver Function Tests: Recent Labs  Lab 11/30/16 1347  AST 31  ALT 10*  ALKPHOS 69  BILITOT 1.5*  PROT 5.4*  ALBUMIN 2.1*   No  results for input(s): LIPASE, AMYLASE in the last 168 hours. No results for input(s): AMMONIA in the last 168 hours. Coagulation profile Recent Labs  Lab 11/27/16 1150 11/27/16 1515  INR 1.21 1.23    CBC: Recent Labs  Lab 11/28/16 0550 11/29/16 0517 11/30/16 0447 12/01/16 0426 12/02/16 0327  WBC 3.9* 4.2 4.5 4.0 4.5  HGB 7.5* 7.8* 8.0* 7.7* 7.4*  HCT 24.0* 25.6* 26.3* 25.0* 23.7*  MCV 87.9 88.6 88.0 88.7 88.8  PLT 177 193 177 151 167   Cardiac Enzymes: Recent Labs  Lab 11/27/16 2130 11/30/16 2122 12/01/16 0426 12/01/16 0813  TROPONINI <0.03 <0.03 <0.03 <0.03   BNP: Invalid input(s): POCBNP CBG: No results for input(s): GLUCAP in the last 168 hours. D-Dimer No results for input(s): DDIMER in the last 72 hours. Hgb A1c No results for input(s): HGBA1C in the last 72 hours. Lipid Profile No results for input(s): CHOL, HDL, LDLCALC, TRIG, CHOLHDL, LDLDIRECT in the last 72 hours. Thyroid function studies No results for input(s): TSH, T4TOTAL, T3FREE, THYROIDAB in the last 72 hours.  Invalid input(s): FREET3 Anemia work up No results for input(s): VITAMINB12,  FOLATE, FERRITIN, TIBC, IRON, RETICCTPCT in the last 72 hours. Microbiology Recent Results (from the past 240 hour(s))  MRSA PCR Screening     Status: Abnormal   Collection Time: 11/27/16  9:53 PM  Result Value Ref Range Status   MRSA by PCR POSITIVE (A) NEGATIVE Final    Comment:        The GeneXpert MRSA Assay (FDA approved for NASAL specimens only), is one component of a comprehensive MRSA colonization surveillance program. It is not intended to diagnose MRSA infection nor to guide or monitor treatment for MRSA infections. RESULT CALLED TO, READ BACK BY AND VERIFIED WITH: RN Madolyn Frieze [phone] MLM   Culture, blood (routine x 2)     Status: None (Preliminary result)   Collection Time: 11/29/16 12:18 AM  Result Value Ref Range Status   Specimen Description BLOOD LEFT ANTECUBITAL  Final    Special Requests IN PEDIATRIC BOTTLE Blood Culture adequate volume  Final   Culture NO GROWTH 3 DAYS  Final   Report Status PENDING  Incomplete  Culture, blood (routine x 2)     Status: None (Preliminary result)   Collection Time: 11/29/16 12:18 AM  Result Value Ref Range Status   Specimen Description BLOOD LEFT HAND  Final   Special Requests IN PEDIATRIC BOTTLE Blood Culture adequate volume  Final   Culture NO GROWTH 3 DAYS  Final   Report Status PENDING  Incomplete  Culture, blood (routine x 2)     Status: None (Preliminary result)   Collection Time: 11/30/16  9:25 PM  Result Value Ref Range Status   Specimen Description BLOOD LEFT HAND  Final   Special Requests   Final    BOTTLES DRAWN AEROBIC ONLY Blood Culture adequate volume   Culture NO GROWTH 2 DAYS  Final   Report Status PENDING  Incomplete  Culture, blood (routine x 2)     Status: None (Preliminary result)   Collection Time: 11/30/16  9:30 PM  Result Value Ref Range Status   Specimen Description BLOOD LEFT WRIST  Final   Special Requests IN PEDIATRIC BOTTLE Blood Culture adequate volume  Final   Culture NO GROWTH 2 DAYS  Final   Report Status PENDING  Incomplete      Studies:  Dg Chest 2 View  Result Date: 11/30/2016 CLINICAL DATA:  Fever today. EXAM: CHEST  2 VIEW COMPARISON:  CT chest 11/27/2016.  Chest radiograph 11/27/2016. FINDINGS: Mild cardiac enlargement. No vascular congestion. No airspace disease or consolidation in the lungs. No blunting of costophrenic angles. No pneumothorax. Calcification of the aorta. Prominent lymphadenopathy demonstrated at CT is not well visualized radiographically. IMPRESSION: Cardiac enlargement.  No evidence of active pulmonary disease. Electronically Signed   By: Lucienne Capers M.D.   On: 11/30/2016 23:36   US Soft Tissue Neck  Result Date: 12/01/2016 CLINICAL DATA:  66 year old male with a history of neck mass, prior biopsy, possible infection EXAM: ULTRASOUND OF HEAD/NECK SOFT  TISSUES TECHNIQUE: Ultrasound examination of the head and neck soft tissues was performed in the area of clinical concern. COMPARISON:  Ultrasound 11/28/2016, prior CT 11/27/2016 FINDINGS: Grayscale and color duplex ultrasound formed in the region of clinical concern. Multiple enlarged lymph nodes again evident, as previously demonstrated on ultrasound and CT imaging. No focal fluid collection. Post biopsy changes of targeted lymph node are evident without fluid collection, gas, or significant edema. IMPRESSION: Ultrasound survey in the region of clinical concern demonstrates no evidence of focal fluid collection or gas. Expected  changes of prior biopsy of the targeted enlarged cervical lymph nodes. Electronically Signed   By: Corrie Mckusick D.O.   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 He understands this particular type of lymphoma is very aggressive--untreated survival would be in months at most--and though it does respond to therapy it generally recurs. Treatment has multiple side effects. Despite that he is very clear that he desires therapy and that is not unreasonable.  There are multiple chemotherapy protocols which are effective at remission induction. I do not know to what extent [name] Mineo would be able to tolerate CHOP-Rituximab or similar.I think a better way to start is with steroids and rituximab alone and reassess whether we can  proceed to more aggressive treatment.  We discussed the possible toxicities, side effects and complications of prednisone and we will be starting that in AM for a 5 day course.  I have written for rituximab to be given 11/23, taking account of the fact that we have a holiday this week and that we would like to have hepatitis serologies available (to be drawn tomorrow AM). The charge nurse will work on operationalizing the rituxan infusion and will let me know if it will not be possible on this floor or in the HD unit.  I have written for transfusion in AM with expectation of further drop in his Hb. I have consulted GI as this particular type of lymphoma involves the GI tract unusually frequently and though the patient is guaiac negative I suspect a GI source of his (presumed) blood loss. I also wrote for a bone marrow biopsy per IR.  Will follow closely with you. Appreciate your help to this patient!    Chauncey Cruel, MD 12/02/2016  7:01 PM Medical Oncology and Hematology [facility] [address] [place], [place] [address] Tel. [phone]    Fax. [phone]

## 2016-12-02 NOTE — Progress Notes (Signed)
Progress Note  Patient Name: [name] [name] Date of Encounter: 12/02/2016  Primary Cardiologist: Dr. Percival Spanish  Subjective   Feeling well.  Denies chest pain or shortness of breath.  Denies palpitations.  Inpatient Medications    Scheduled Meds: . aspirin  81 mg Oral Daily  . Chlorhexidine Gluconate Cloth  6 each Topical Q0600  . diltiazem  60 mg Oral Q6H  . doxycycline  100 mg Oral Q12H  . ferrous sulfate  325 mg Oral BID WC  . loratadine  10 mg Oral Daily  . mupirocin ointment  1 application Nasal BID  . pantoprazole  40 mg Oral Daily  . polyethylene glycol  17 g Oral BID  . rosuvastatin  20 mg Oral q1800   Continuous Infusions: . heparin 2,450 Units/hr (12/02/16 0556)   PRN Meds: acetaminophen, ondansetron (ZOFRAN) IV   Vital Signs    Vitals:   12/01/16 2353 12/01/16 2355 12/02/16 0556 12/02/16 0700  BP: 101/65  118/73 117/83  Pulse:    100  Resp:    16  Temp:  98.7 F (37.1 C)  97.9 F (36.6 C)  TempSrc:  Oral  Oral  SpO2:    97%  Weight:      Height:        Intake/Output Summary (Last 24 hours) at 12/02/2016 0816 Last data filed at 12/02/2016 0600 Gross per 24 hour  Intake 1675.75 ml  Output 710 ml  Net 965.75 ml   Filed Weights   11/28/16 0532 11/29/16 0532 11/30/16 0400  Weight: 85 kg (187 lb 6.3 oz) 86.2 kg (190 lb 0.6 oz) 86.7 kg (191 lb 2.2 oz)    Telemetry    Atrial fibrillation.  Rate 70s-110s.- Personally Reviewed  ECG    N/A- Personally Reviewed  Physical Exam   VS:  BP 117/83 (BP Location: Left Arm)   Pulse 100   Temp 97.9 F (36.6 C) (Oral)   Resp 16   Ht '5\' 11"'$  (1.803 m)   Wt 86.7 kg (191 lb 2.2 oz)   SpO2 97%   BMI 26.66 kg/m  , BMI Body mass index is 26.66 kg/m. GENERAL:  Well appearing HEENT: Pupils equal round and reactive, fundi not visualized, oral mucosa unremarkable NECK:  No jugular venous distention, waveform within normal limits, carotid upstroke brisk and symmetric, no bruits, no thyromegaly LYMPHATICS:   No cervical adenopathy LUNGS:  Clear to auscultation bilaterally HEART:  Irregularly irregular.  PMI not displaced or sustained,S1 and S2 within normal limits, no S3, no S4, no clicks, no rubs, no murmurs ABD:  Flat, positive bowel sounds normal in frequency in pitch, no bruits, no rebound, no guarding, no midline pulsatile mass, no hepatomegaly, no splenomegaly EXT:  2 plus pulses throughout, no edema, no cyanosis no clubbing.  Right upper extremity much larger than left. SKIN:  No rashes no nodules NEURO:  Cranial nerves II through XII grossly intact, motor grossly intact throughout PSYCH: Mild cognitive dysfunction.  Oriented to person place and time   Labs    Chemistry Recent Labs  Lab 11/27/16 1357 11/30/16 1347 12/01/16 0426  NA 135 133* 136  K 3.8 3.5 3.3*  CL 99* 100* 103  CO2 '23 23 25  '$ GLUCOSE 89 132* 108*  BUN '11 12 11  '$ CREATININE 0.88 0.72 0.70  CALCIUM 8.6* 8.0* 7.8*  PROT  --  5.4*  --   ALBUMIN  --  2.1*  --   AST  --  31  --  ALT  --  10*  --   ALKPHOS  --  69  --   BILITOT  --  1.5*  --   GFRNONAA >60 >60 >60  GFRAA >60 >60 >60  ANIONGAP '13 10 8     '$ Hematology Recent Labs  Lab 11/30/16 0447 12/01/16 0426 12/02/16 0327  WBC 4.5 4.0 4.5  RBC 2.99* 2.82* 2.67*  HGB 8.0* 7.7* 7.4*  HCT 26.3* 25.0* 23.7*  MCV 88.0 88.7 88.8  MCH 26.8 27.3 27.7  MCHC 30.4 30.8 31.2  RDW 16.7* 16.8* 17.3*  PLT 177 151 167    Cardiac Enzymes Recent Labs  Lab 11/27/16 2130 11/30/16 2122 12/01/16 0426 12/01/16 0813  TROPONINI <0.03 <0.03 <0.03 <0.03   No results for input(s): TROPIPOC in the last 168 hours.   BNPNo results for input(s): BNP, PROBNP in the last 168 hours.   DDimer No results for input(s): DDIMER in the last 168 hours.   Radiology    Dg Chest 2 View  Result Date: 11/30/2016 CLINICAL DATA:  Fever today. EXAM: CHEST  2 VIEW COMPARISON:  CT chest 11/27/2016.  Chest radiograph 11/27/2016. FINDINGS: Mild cardiac enlargement. No vascular  congestion. No airspace disease or consolidation in the lungs. No blunting of costophrenic angles. No pneumothorax. Calcification of the aorta. Prominent lymphadenopathy demonstrated at CT is not well visualized radiographically. IMPRESSION: Cardiac enlargement.  No evidence of active pulmonary disease. Electronically Signed   By: Lucienne Capers M.D.   On: 11/30/2016 23:36   US Soft Tissue Neck  Result Date: 12/01/2016 CLINICAL DATA:  66 year old male with a history of neck mass, prior biopsy, possible infection EXAM: ULTRASOUND OF HEAD/NECK SOFT TISSUES TECHNIQUE: Ultrasound examination of the head and neck soft tissues was performed in the area of clinical concern. COMPARISON:  Ultrasound 11/28/2016, prior CT 11/27/2016 FINDINGS: Grayscale and color duplex ultrasound formed in the region of clinical concern. Multiple enlarged lymph nodes again evident, as previously demonstrated on ultrasound and CT imaging. No focal fluid collection. Post biopsy changes of targeted lymph node are evident without fluid collection, gas, or significant edema. IMPRESSION: Ultrasound survey in the region of clinical concern demonstrates no evidence of focal fluid collection or gas. Expected changes of prior biopsy of the targeted enlarged cervical lymph nodes. Electronically Signed   By: Corrie Mckusick D.O.   On: 12/01/2016 12:16    Cardiac Studies   Echo 11/29/16: Study Conclusions  - Left ventricle: Wall thickness was increased in a pattern of   moderate LVH. Systolic function was normal. The estimated   ejection fraction was in the range of 55% to 60%. Wall motion was   normal; there were no regional wall motion abnormalities. The   study is not technically sufficient to allow evaluation of LV   diastolic function. - Aortic valve: Trileaflet. Sclerosis without stenosis. There was   trivial regurgitation. - Mitral valve: Mildly thickened leaflets . There was trivial   regurgitation. - Left atrium: Moderately  dilated. - Right ventricle: The cavity size was mildly dilated. Low normal   systolic function. - Right atrium: Moderately dilated. - Tricuspid valve: There was mild regurgitation. - Pulmonary arteries: PA peak pressure: 35 mm Hg (S). - Inferior vena cava: The vessel was normal in size. The   respirophasic diameter changes were in the normal range (>= 50%),   consistent with normal central venous pressure. - Pericardium, extracardiac: A trivial pericardial effusion was   identified posterior to the heart. Features were not consistent   with  tamponade physiology.  Impressions:  - LVEF 55-60%, moderate LVH, normal wall motion, aortic valve   sclerosis with trivial AI, moderate biatrial enlargement, mild   RVE with low normal RV systolic function, mild TR, RVSP 35 mmHg,   normal IVC, trivial posterior pericardial effusion.  Patient Profile     Mr. Boivin is a 58M with prior stroke, hypertension, hyperlipidemia, and anemia who was undergoing workup for his anemia with a biopsy when he was found to go into atrial fibrillation with rapid ventricular response.  Assessment & Plan    # Paroxysmal atrial fibrillation: Mr. Pickerill remains in atrial fibrillation.  His heart rate is much better-controlled on diltiazem.  We will consolidate this to 260 mg daily.  His anemia continues to worsen.  His hemoglobin is down to 7.4 from 8.9 on admission.  Hemoglobin 07/2008 was 16.3.  He is currently on a heparin drip.  Ideally, he would be on an oral anticoagulant.  Preliminary results of his bone marrow biopsy reveal non-Hodgkin's lymphoma.  Given this finding we will stop heparin and start aspirin 81 mg only.  We will defer to hematology/oncology regarding if and when it is safe to start him on an oral anticoagulant.  Would transfuse to maintain a hemoglobin greater than 7.    For questions or updates, please contact [place] Please consult [email or web address] for contact info under Cardiology/STEMI.        Signed, Skeet Latch, MD  12/02/2016, 8:16 AM

## 2016-12-03 DIAGNOSIS — D649 Anemia, unspecified: Secondary | ICD-10-CM

## 2016-12-03 DIAGNOSIS — I700 Atherosclerosis of aorta: Secondary | ICD-10-CM

## 2016-12-03 DIAGNOSIS — C8311 Mantle cell lymphoma, lymph nodes of head, face, and neck: Secondary | ICD-10-CM

## 2016-12-03 LAB — RETICULOCYTES
RBC.: 2.64 MIL/uL — ABNORMAL LOW (ref 4.22–5.81)
RETIC COUNT ABSOLUTE: 105.6 10*3/uL (ref 19.0–186.0)
Retic Ct Pct: 4 % — ABNORMAL HIGH (ref 0.4–3.1)

## 2016-12-03 LAB — HEMOGLOBIN AND HEMATOCRIT, BLOOD
HEMATOCRIT: 28.8 % — AB (ref 39.0–52.0)
HEMOGLOBIN: 9.2 g/dL — AB (ref 13.0–17.0)

## 2016-12-03 LAB — CBC
HCT: 23.6 % — ABNORMAL LOW (ref 39.0–52.0)
Hemoglobin: 7.4 g/dL — ABNORMAL LOW (ref 13.0–17.0)
MCH: 28 pg (ref 26.0–34.0)
MCHC: 31.4 g/dL (ref 30.0–36.0)
MCV: 89.4 fL (ref 78.0–100.0)
PLATELETS: 164 10*3/uL (ref 150–400)
RBC: 2.64 MIL/uL — AB (ref 4.22–5.81)
RDW: 17.4 % — AB (ref 11.5–15.5)
WBC: 4.3 10*3/uL (ref 4.0–10.5)

## 2016-12-03 LAB — LACTATE DEHYDROGENASE: LDH: 216 U/L — AB (ref 98–192)

## 2016-12-03 LAB — FOLATE: Folate: 8.9 ng/mL (ref >5.9)

## 2016-12-03 MED ORDER — METOPROLOL TARTRATE 12.5 MG HALF TABLET
12.5000 mg | ORAL_TABLET | Freq: Two times a day (BID) | ORAL | Status: DC
  Administered 2016-12-03 – 2016-12-05 (×5): 12.5 mg via ORAL
  Filled 2016-12-03 (×6): qty 1

## 2016-12-03 MED ORDER — ALLOPURINOL 300 MG PO TABS
300.0000 mg | ORAL_TABLET | Freq: Every day | ORAL | Status: DC
  Administered 2016-12-03 – 2016-12-06 (×4): 300 mg via ORAL
  Filled 2016-12-03 (×4): qty 1

## 2016-12-03 NOTE — Progress Notes (Signed)
Discussed rituxan administration with IV team.  Medication would be available but no one from IV team available to hang medication and monitor administration.  IV team to contact MD and make arrangements.    Bonnita Nasuti Pharm.D. CPP, BCPS Clinical Pharmacist [phone] 12/03/2016 3:14 PM

## 2016-12-03 NOTE — Progress Notes (Signed)
Progress Note  Patient Name: [name] [name] Date of Encounter: 12/03/2016  Primary Cardiologist: Dr. Percival Spanish  Subjective   Pt denies palpitations, chest pain, dizziness, and feelings of syncope. He is resting in bed with 1U PRBC transfusing.  Inpatient Medications    Scheduled Meds: . allopurinol  300 mg Oral Daily  . aspirin  81 mg Oral Daily  . diltiazem  240 mg Oral Daily  . doxycycline  100 mg Oral Q12H  . ferrous sulfate  325 mg Oral BID WC  . loratadine  10 mg Oral Daily  . pantoprazole  40 mg Oral Daily  . polyethylene glycol  17 g Oral BID  . predniSONE  50 mg Oral Q breakfast  . [START ON 12/05/2016] riTUXimab (RITUXAN) IV infusion  375 mg/m2 Intravenous Once  . rosuvastatin  20 mg Oral q1800   Continuous Infusions: . sodium chloride     PRN Meds: acetaminophen, heparin lock flush, heparin lock flush, ondansetron (ZOFRAN) IV, sodium chloride flush, sodium chloride flush   Vital Signs    Vitals:   12/03/16 0709 12/03/16 0736 12/03/16 1043 12/03/16 1058  BP: (!) 117/99  (!) 103/51 109/74  Pulse: (!) 127 93 95 88  Resp: (!) 32 20 18 (!) 28  Temp: 98.2 F (36.8 C)  98.8 F (37.1 C) 98.1 F (36.7 C)  TempSrc: Oral  Oral Oral  SpO2: 96% 91% 95% 95%  Weight:      Height:        Intake/Output Summary (Last 24 hours) at 12/03/2016 1142 Last data filed at 12/03/2016 0434 Gross per 24 hour  Intake 720 ml  Output 500 ml  Net 220 ml   Filed Weights   11/29/16 0532 11/30/16 0400 12/03/16 0431  Weight: 190 lb 0.6 oz (86.2 kg) 191 lb 2.2 oz (86.7 kg) 155 lb 10.3 oz (70.6 kg)     Physical Exam   General: Well developed, well nourished, male appearing in no acute distress. Head: Normocephalic, atraumatic.  Neck: JVD difficult to assess Lungs:  Resp regular and unlabored, CTA. Heart: Irregular rhythm, regular rate, no murmur; no rub. Abdomen: Soft, non-tender, non-distended with normoactive bowel sounds. No hepatomegaly. No rebound/guarding. No obvious  abdominal masses. Extremities: No clubbing, cyanosis, no edema. Distal pedal pulses are 2+ bilaterally. Neuro: Alert and oriented X 3. Moves all extremities spontaneously. Psych: Normal affect.  Labs    Chemistry Recent Labs  Lab 11/27/16 1357 11/30/16 1347 12/01/16 0426  NA 135 133* 136  K 3.8 3.5 3.3*  CL 99* 100* 103  CO2 23 23 25   GLUCOSE 89 132* 108*  BUN 11 12 11   CREATININE 0.88 0.72 0.70  CALCIUM 8.6* 8.0* 7.8*  PROT  --  5.4*  --   ALBUMIN  --  2.1*  --   AST  --  31  --   ALT  --  10*  --   ALKPHOS  --  69  --   BILITOT  --  1.5*  --   GFRNONAA >60 >60 >60  GFRAA >60 >60 >60  ANIONGAP 13 10 8      Hematology Recent Labs  Lab 12/01/16 0426 12/02/16 0327 12/03/16 0357  WBC 4.0 4.5 4.3  RBC 2.82* 2.67* 2.64*  2.64*  HGB 7.7* 7.4* 7.4*  HCT 25.0* 23.7* 23.6*  MCV 88.7 88.8 89.4  MCH 27.3 27.7 28.0  MCHC 30.8 31.2 31.4  RDW 16.8* 17.3* 17.4*  PLT 151 167 164    Cardiac Enzymes Recent Labs  Lab 11/27/16 2130 11/30/16 2122 12/01/16 0426 12/01/16 0813  TROPONINI <0.03 <0.03 <0.03 <0.03   No results for input(s): TROPIPOC in the last 168 hours.   BNPNo results for input(s): BNP, PROBNP in the last 168 hours.   DDimer No results for input(s): DDIMER in the last 168 hours.   Radiology    No results found.   Telemetry    Rate controlled Afib 80-90s - Personally Reviewed  ECG    No new tracings - Personally Reviewed   Cardiac Studies   Echo 11/29/16: Study Conclusions - Left ventricle: Wall thickness was increased in a pattern of moderate LVH. Systolic function was normal. The estimated ejection fraction was in the range of 55% to 60%. Wall motion was normal; there were no regional wall motion abnormalities. The study is not technically sufficient to allow evaluation of LV diastolic function. - Aortic valve: Trileaflet. Sclerosis without stenosis. There was trivial regurgitation. - Mitral valve: Mildly thickened  leaflets . There was trivial regurgitation. - Left atrium: Moderately dilated. - Right ventricle: The cavity size was mildly dilated. Low normal systolic function. - Right atrium: Moderately dilated. - Tricuspid valve: There was mild regurgitation. - Pulmonary arteries: PA peak pressure: 35 mm Hg (S). - Inferior vena cava: The vessel was normal in size. The respirophasic diameter changes were in the normal range (>= 50%), consistent with normal central venous pressure. - Pericardium, extracardiac: A trivial pericardial effusion was identified posterior to the heart. Features were not consistent with tamponade physiology.  Impressions: - LVEF 55-60%, moderate LVH, normal wall motion, aortic valve sclerosis with trivial AI, moderate biatrial enlargement, mild RVE with low normal RV systolic function, mild TR, RVSP 35 mmHg, normal IVC, trivial posterior pericardial effusion.    Patient Profile     66 y.o. male with prior stroke, hypertension, hyperlipidemia, and anemia who was undergoing workup for his anemia with a biopsy when he was found to go into atrial fibrillation with rapid ventricular response. Biopsy consistent with NHL.  Assessment & Plan    1. Paroxysmal Afib with RVR - he is still in rate controlled Afib in the 80-90s - This patients CHA2DS2-VASc Score and unadjusted Ischemic Stroke Rate (% per year) is equal to 4.8 % stroke rate/year from a score of 4 (HTN, age, stroke); however, given his anemia and new diagnosis of NHL, heparin drip was stopped. Anticoagulation will be per hematology/GI - IV diltiazem has been transitioned to 240 mg daily - no medication changes today, can titrate up diltiazem if needed for rate control as he starts chemo for NHL   Signed, Ledora Bottcher , PA-C 11:42 AM 12/03/2016 Pager: [phone]

## 2016-12-03 NOTE — Progress Notes (Signed)
Spoke with Pharmacist in regards to patient [name] [name], [name] [name] to receive Rituxan administration on Friday, Nov. 23, 2018. No chemo competent IV Nurse available until Monday, Nov 26th 2018. I contacted [facility] to see if they had staff available on Friday and they could help due to only open for a half day and low staffing. Also reach out to Aurelio Jew via voicemail and have not received a response at this time. Spoke with Lorriane Shire RN on 6E to contact MD to see if the patient could be transferred to [facility] Oncology unit to receive the Rituxan administration. I called Dr Jana Hakim office and left message with his nurse.

## 2016-12-03 NOTE — Progress Notes (Signed)
IV team unable to give chemotherapy infusion Thursday or Friday. Spoke to Dr. Jana Hakim, stated he will re-schedule Rituxan for Monday, 11/26.

## 2016-12-03 NOTE — Progress Notes (Signed)
PROGRESS NOTE  [name] [name] PYP:[id number] DOB: August 29, 1950 DOA: 11/27/2016 PCP: Wenda Low, MD  Brief Narrative: 71yom presented to IR as outpt for elective biopsy of neck mass, found to have afib with RVR and sent to ED. Admitted for Afib/RVR  Assessment/Plan Afib/RVR. TSH WNL. CHA2DS2-VASc 6. Echo showed normal LVEF. - fair control. Management per cardioloyg. - eventual anticoagulation recommended per cardiology   Normocytic anemia, reported hematuria, hematochezia, oral bleeding.  - no evidence of hemolysis. Oncology suspects bleeding as etiology and consulted GI as this lymphoma can involve the GI tract. Oncology ordered PRBC transfusrion. - Hgb stable. GI saw pt and pt refused endoscopy.  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 Bone marrow biopsy ordered.  PMH CVA 2008  Essential HTN - stable  Aortic atherosclerosis - follow-up as an outpatient   HR control suboptimal. Await further recs from cardiology and oncology  DVT prophylaxis: SCDs Code Status: full Family Communication: none Disposition Plan: return to [facility], MD  [facility] Direct contact: [phone] --[place] [place]  --[email or web address]; password [email or web address]  7PM-7AM contact night coverage as above 12/03/2016, 11:23 AM  LOS: 6 days   Consultants:  Cardiology  IR  Oncology   GI  Procedures:  11/16 Technically successful US guided biopsy of indeterminate right cervical lymph node.  11/21 transfusion 2 units PRBC  Echo Impressions:  - LVEF 55-60%, moderate LVH, normal wall motion, aortic valve   sclerosis with trivial AI, moderate biatrial enlargement, mild   RVE with low normal RV systolic function, mild TR, RVSP 35 mmHg,   normal IVC, trivial posterior pericardial effusion.  Antimicrobials:    Interval history/Subjective: Feels ok, no complaints.  Objective: Vitals:  Vitals:   12/03/16 1043 12/03/16 1058  BP:  (!) 103/51 109/74  Pulse: 95 88  Resp: 18 (!) 28  Temp: 98.8 F (37.1 C) 98.1 F (36.7 C)  SpO2: 95% 95%    Exam:  Constitutional:  . Appears calm and comfortable Eyes:  . pupils and irises appear normal ENMT:  . grossly normal hearing  Neck:  . Right neck, supraclavicular mass noted; nontender. No fluctuance. Respiratory:  . CTA bilaterally, no w/r/r.  . Respiratory effort normal  Cardiovascular:  . RRR, no m/r/g . No LE extremity edema   RUE edema noted . Telemetry afib 110s-120s Musculoskeletal:  . Digits/nails BUE: no clubbing, cyanosis, petechiae, infection Psychiatric:  . Mental status o Mood, affect appropriate  I have personally reviewed the following:   Labs:  Hgb stable 7.4  Imaging studies:  CXR no acute diasease  CT angio chest noted  U/s RUE and soft tissue nexk noted  Scheduled Meds: . allopurinol  300 mg Oral Daily  . aspirin  81 mg Oral Daily  . diltiazem  240 mg Oral Daily  . doxycycline  100 mg Oral Q12H  . ferrous sulfate  325 mg Oral BID WC  . loratadine  10 mg Oral Daily  . pantoprazole  40 mg Oral Daily  . polyethylene glycol  17 g Oral BID  . predniSONE  50 mg Oral Q breakfast  . [START ON 12/05/2016] riTUXimab (RITUXAN) IV infusion  375 mg/m2 Intravenous Once  . rosuvastatin  20 mg Oral q1800   Continuous Infusions: . sodium chloride      Principal Problem:   Atrial fibrillation with RVR (HCC) Active Problems:   Mild intellectual disability   Essential hypertension   Constipation   Hyperlipidemia  Normocytic anemia   Lymphadenopathy   Mantle cell lymphoma ([place])   LOS: 6 days

## 2016-12-03 NOTE — Plan of Care (Signed)
Patient educated on use of call light system. Verbalizes understanding of need to call for assistance when necessary. Patient uses call light appropriately. Bed alarm implemented for safety.

## 2016-12-03 NOTE — Progress Notes (Signed)
Spoke to Dr. Jana Hakim, okay to give 2 units of blood. Hemoglobin 7.4. Will transfuse and monitor patient.

## 2016-12-03 NOTE — H&P (Signed)
Chief Complaint: Possible lymphoma  Referring Physician(s): Magrinat, [name]  Supervising Physician: Jacqulynn Cadet  Patient Status: [facility] - In-pt  History of Present Illness: [name] [name] is a 66 y.o. male who is known to our service.  He underwent a lymph node biopsy by Dr. Pascal Lux on 11/28/2016.  His lymphadenopathy is worrisome for lymphoma.  We are now asked to perform a bone marrow biopsy.  Other medical issues include A-fib with RVR (rate controlled now), bladder tumor, HTN.  Past Medical History:  Diagnosis Date  . Bladder tumor   . Cataract   . CVA (cerebral infarction)    2008  . Hyperlipidemia   . Hypertension     Past Surgical History:  Procedure Laterality Date  . CHOLECYSTECTOMY    . EYE SURGERY    . FLEXIBLE SIGMOIDOSCOPY N/A 11/30/2013   Procedure: FLEXIBLE SIGMOIDOSCOPY;  Surgeon: Inda Castle, MD;  Location: [facility];  Service: Endoscopy;  Laterality: N/A;  . HOT HEMOSTASIS N/A 11/30/2013   Procedure: HOT HEMOSTASIS (ARGON PLASMA COAGULATION/BICAP);  Surgeon: Inda Castle, MD;  Location: [facility];  Service: Endoscopy;  Laterality: N/A;  . MOUTH SURGERY      Allergies: Patient has no known allergies.  Medications: Prior to Admission medications   Medication Sig Start Date End Date Taking? Authorizing Provider  acetaminophen (TYLENOL) 650 MG CR tablet Take 650 mg every 8 (eight) hours as needed by mouth (for pain or fever ("for 48 hours")).   Yes [provider]  Cholecalciferol (VITAMIN D-3) 1000 units CAPS Take 2,000 Units daily by mouth.   Yes [provider]  docusate sodium (COLACE) 100 MG capsule Take 100 mg by mouth 2 (two) times daily. For constipation   Yes [provider]  ferrous sulfate 325 (65 FE) MG tablet Take 325 mg 2 (two) times daily with a meal by mouth.   Yes [provider]  hydrochlorothiazide (HYDRODIURIL) 25 MG tablet Take 25 mg by mouth daily.   Yes [provider]  ibuprofen (ADVIL,MOTRIN) 400 MG tablet Take 400 mg every 6 (six) hours as needed by mouth (for pain).    Yes [provider]  lisinopril (PRINIVIL,ZESTRIL) 10 MG tablet Take 10 mg by mouth daily.   Yes [provider]  loratadine (CLARITIN) 10 MG tablet Take 10 mg daily by mouth.   Yes [provider]  omeprazole (PRILOSEC) 20 MG capsule Take 1 capsule by mouth daily. 09/17/13  Yes [provider]  simvastatin (ZOCOR) 40 MG tablet Take 40 mg by mouth at bedtime.   Yes [provider]     Family History  Problem Relation Age of Onset  . Colon cancer Father   . Colon cancer Sister 76  . Hypertension Sister     Social History   Socioeconomic History  . Marital status: Single    Spouse name: None  . Number of children: None  . Years of education: None  . Highest education level: None  Social Needs  . Financial resource strain: None  . Food insecurity - worry: None  . Food insecurity - inability: None  . Transportation needs - medical: None  . Transportation needs - non-medical: None  Occupational History  . Occupation: disabled  Tobacco Use  . Smoking status: Former Smoker    Last attempt to quit: 09/15/1985    Years since quitting: 31.2  . Smokeless tobacco: Never Used  . Tobacco comment: Started at age 72, quit in the 1980's  Substance and Sexual Activity  . Alcohol use: No  . Drug use: No  . Sexual activity: None  Other Topics Concern  . None  Social History Narrative  . None    Review of Systems: A 12 point ROS discussed  Review of Systems  Constitutional: Positive for activity change.  HENT: Negative.   Respiratory: Negative.   Cardiovascular: Negative.   Gastrointestinal: Negative.   Genitourinary: Negative.   Musculoskeletal: Negative.   Skin: Negative.   Neurological: Positive for weakness.  Hematological: Negative.   Psychiatric/Behavioral: Negative.     Vital Signs: BP 109/74   Pulse 88    Temp 98.1 F (36.7 C) (Oral)   Resp (!) 28   Ht '5\' 11"'$  (1.803 m)   Wt 155 lb 10.3 oz (70.6 kg)   SpO2 95%   BMI 21.71 kg/m   Physical Exam  Constitutional: He is oriented to person, place, and time. He appears well-developed.  HENT:  Head: Normocephalic.  Eyes: EOM are normal.  Neck: Normal range of motion.  Cardiovascular: Normal rate, regular rhythm and normal heart sounds.  Pulmonary/Chest: Effort normal and breath sounds normal. No respiratory distress.  Abdominal: Soft. He exhibits no distension. There is no tenderness.  Musculoskeletal:       Arms: Large mass right bicep  Neurological: He is alert and oriented to person, place, and time.  Skin: Skin is warm and dry.  Psychiatric: He has a normal mood and affect. His behavior is normal. Judgment and thought content normal.  Vitals reviewed.   Imaging: Dg Chest 2 View  Result Date: 11/30/2016 CLINICAL DATA:  Fever today. EXAM: CHEST  2 VIEW COMPARISON:  CT chest 11/27/2016.  Chest radiograph 11/27/2016. FINDINGS: Mild cardiac enlargement. No vascular congestion. No airspace disease or consolidation in the lungs. No blunting of costophrenic angles. No pneumothorax. Calcification of the aorta. Prominent lymphadenopathy demonstrated at CT is not well visualized radiographically. IMPRESSION: Cardiac enlargement.  No evidence of active pulmonary disease. Electronically Signed   By: Lucienne Capers M.D.   On: 11/30/2016 23:36   Ct Soft Tissue Neck Wo Contrast  Result Date: 11/21/2016 CLINICAL DATA:  Initial evaluation for right-sided neck swelling. EXAM: CT NECK WITHOUT CONTRAST TECHNIQUE: Multidetector CT imaging of the neck was performed following the standard protocol without intravenous contrast. COMPARISON:  None available. FINDINGS: Pharynx and larynx: Oral cavity within normal limits without mass lesion or loculated fluid collection. Patient is edentulous. Palatine tonsils symmetric and within normal limits bilaterally. Few  small calcified tonsilliths noted. Parapharyngeal fat preserved. Nasopharynx within normal limits. Retropharyngeal soft tissues demonstrate no acute abnormality. Epiglottis normal. Vallecula clear. Small amount of layering secretions present within the right hypopharynx, extending into the right piriform sinus. Remainder of the hypopharynx and supraglottic larynx grossly normal. True cords symmetric and within normal limits. Subglottic airway clear. Salivary glands: Salivary glands including the parotid and submandibular glands are within normal limits bilaterally. Thyroid: Thyroid within normal limits. Lymph nodes: Extensive bulky adenopathy seen throughout the neck and visualized upper chest, slightly worse on the right as compared to the left. Nodes are seen at essentially all levels, most prevalent within the right supraclavicular and axillary region. For reference purposes, largest discrete node within the right supraclavicular region measures approximately 2.6 x 5.1 cm (series 2, image 85). Largest discrete nodal mass within the right axillary region measures approximately 9.4 x 3.7 cm (series 2, image 114). In the left neck, largest discrete node seen at left level III and measures 1.8 x  2.7 cm (series 2, image 71). Largest node within the left upper chest seen within the left subpectoral region and measures 5.4 x 2.9 cm (series 2, image 106). Study nodes seen within the partially visualized upper mediastinum as well, largest of which measures 1 cm in the prevascular region. Findings highly concerning for lymphoproliferative disorder/ lymphoma. Vascular: Atherosclerotic changes noted about the aortic arch and carotid bifurcations. Limited intracranial: Unremarkable. Visualized orbits: Partially visualized globes and orbital soft tissues within normal limits. Mastoids and visualized paranasal sinuses: Chronic maxillary sinusitis noted, right worse than left. Retained metallic density at the posterior right  maxillary sinus. Sequelae of prior ORIF at the anterior maxillary sinuses/alveolar ridge. Visualize mastoids and middle ear cavities are clear. Skeleton: No acute osseus abnormality. No worrisome lytic or blastic osseous lesions. Straightening of the normal cervical lordosis with trace retrolisthesis of C3 on C4. Moderate to advanced degenerate spondylolysis at C5-6. Upper chest: Partially visualized lungs are clear. Other: None. IMPRESSION: 1. Extensive bulky adenopathy throughout the neck and visualized upper chest, right worse than left, highly concerning for lymphoproliferative disorder/lymphoma. 2. No other acute abnormality within the neck. 3. Chronic maxillary sinusitis. Electronically Signed   By: Jeannine Boga M.D.   On: 11/21/2016 15:27   Ct Angio Chest Pe W And/or Wo Contrast  Result Date: 11/27/2016 CLINICAL DATA:  Tachycardia. Suspect pulmonary embolism. History of hypertension, ex smoker, bladder tumor. Scheduled for neck biopsy today. EXAM: CT ANGIOGRAPHY CHEST WITH CONTRAST TECHNIQUE: Multidetector CT imaging of the chest was performed using the standard protocol during bolus administration of intravenous contrast. Multiplanar CT image reconstructions and MIPs were obtained to evaluate the vascular anatomy. CONTRAST:  129m ISOVUE-370 IOPAMIDOL (ISOVUE-370) INJECTION 76% COMPARISON:  CT neck November 21, 2016 and chest radiograph November 27, 2016 and CT abdomen and pelvis June 22, 2012 FINDINGS: CARDIOVASCULAR: Adequate contrast opacification of the pulmonary artery's. Main pulmonary artery is not enlarged. No pulmonary arterial filling defects to the level of the segmental branches, distal branches obscured by respiratory motion. Heart is upper limits of normal in size. Mild coronary artery calcifications. Small pericardial effusion. Thoracic aorta is normal course and caliber, unremarkable. MEDIASTINUM/NODES: Greater than expected number of mildly enlarged mediastinal and hilar lymph  nodes. Anterior mediastinal fat stranding. Bulky RIGHT greater than LEFT axillary lymphadenopathy, 12.9 x 6.7 cm RIGHT axillary nodal conglomeration. Bulky RIGHT grand LEFT supraclavicular lymphadenopathy. LUNGS/PLEURA: Tracheobronchial tree is patent, no pneumothorax. No pleural effusions, focal consolidations, pulmonary nodules or masses. UPPER ABDOMEN: New splenomegaly. 6.7 cm cyst (5 Hounsfield units) LEFT renal fossa, larger than prior CT. Stable 3 cm cyst (3 Hounsfield units) RIGHT lobe of the liver. Partially imaged probable portal caval lymphadenopathy. MUSCULOSKELETAL: Nonacute. RIGHT biceps intramuscular lipoma versus atrophy. Multilevel scattered Schmorl's nodes. Review of the MIP images confirms the above findings. IMPRESSION: 1. No acute pulmonary embolism. 2. Bulky supraclavicular, axillary and possibly portal caval lymphadenopathy. 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 3. Borderline cardiomegaly.  No acute pulmonary process. Aortic Atherosclerosis (ICD10-I70.0). Electronically Signed   By: CElon AlasM.D.   On: 11/27/2016 17:13   Dg Chest Port 1 View  Result Date: 11/27/2016 CLINICAL DATA:  Atrial fibrillation EXAM: PORTABLE CHEST 1 VIEW COMPARISON:  None. FINDINGS: Cardiac silhouette is upper limits of normal. Mild aortic atherosclerotic calcification. No focal airspace consolidation or pulmonary edema. No pneumothorax or sizable pleural effusion. IMPRESSION: No active disease. Aortic Atherosclerosis (ICD10-I70.0). Electronically Signed   By: KUlyses JarredM.D.   On: 11/27/2016 15:17  [place] Core Biopsy (lymph Nodes)  Result Date: 11/28/2016 INDICATION: Bulky cervical axillary lymphadenopathy. Please perform ultrasound-guided biopsy for tissue diagnostic purposes. EXAM: ULTRASOUND-GUIDED RIGHT CERVICAL LYMPH NODE BIOPSY COMPARISON:  Neck CT - 11/21/2016; chest CT - 11/17/2016 MEDICATIONS: None ANESTHESIA/SEDATION: Moderate (conscious)  sedation was employed during this procedure. A total of Versed 2 mg and Fentanyl 75 mcg was administered intravenously. Moderate Sedation Time: 10 minutes. The patient's level of consciousness and vital signs were monitored continuously by radiology nursing throughout the procedure under my direct supervision. COMPLICATIONS: None immediate. TECHNIQUE: Informed written consent was obtained from the patient after a discussion of the risks, benefits and alternatives to treatment. Questions regarding the procedure were encouraged and answered. Initial ultrasound scanning demonstrated multiple enlarged cervical lymph nodes. A dominant approximately 3.2 x 1.6 cm lymph node within the superficial aspect of the mid right lateral neck was targeted for biopsy given lymph node location and sonographic window. An ultrasound image was saved for documentation purposes. The procedure was planned. A timeout was performed prior to the initiation of the procedure. The operative was prepped and draped in the usual sterile fashion, and a sterile drape was applied covering the operative field. A timeout was performed prior to the initiation of the procedure. Local anesthesia was provided with 1% lidocaine with epinephrine. Under direct ultrasound guidance, an 18 gauge core needle device was utilized to obtain to obtain 6 core needle biopsies of the dominant right cervical lymph node. The samples were placed in saline and submitted to pathology. The needle was removed and hemostasis was achieved with manual compression. Post procedure scan was negative for significant hematoma. A dressing was placed. The patient tolerated the procedure well without immediate postprocedural complication. IMPRESSION: Technically successful ultrasound guided biopsy of dominant right cervical lymph node. Electronically Signed   By: Sandi Mariscal M.D.   On: 11/28/2016 13:48   US Soft Tissue Neck  Result Date: 12/01/2016 CLINICAL DATA:  66 year old male  with a history of neck mass, prior biopsy, possible infection EXAM: ULTRASOUND OF HEAD/NECK SOFT TISSUES TECHNIQUE: Ultrasound examination of the head and neck soft tissues was performed in the area of clinical concern. COMPARISON:  Ultrasound 11/28/2016, prior CT 11/27/2016 FINDINGS: Grayscale and color duplex ultrasound formed in the region of clinical concern. Multiple enlarged lymph nodes again evident, as previously demonstrated on ultrasound and CT imaging. No focal fluid collection. Post biopsy changes of targeted lymph node are evident without fluid collection, gas, or significant edema. IMPRESSION: Ultrasound survey in the region of clinical concern demonstrates no evidence of focal fluid collection or gas. Expected changes of prior biopsy of the targeted enlarged cervical lymph nodes. Electronically Signed   By: Corrie Mckusick D.O.   On: 12/01/2016 12:16    Labs:  CBC: Recent Labs    11/30/16 0447 12/01/16 0426 12/02/16 0327 12/03/16 0357  WBC 4.5 4.0 4.5 4.3  HGB 8.0* 7.7* 7.4* 7.4*  HCT 26.3* 25.0* 23.7* 23.6*  PLT 177 151 167 164    COAGS: Recent Labs    11/27/16 1150 11/27/16 1515  INR 1.21 1.23    BMP: Recent Labs    11/27/16 1357 11/30/16 1347 12/01/16 0426  NA 135 133* 136  K 3.8 3.5 3.3*  CL 99* 100* 103  CO2 '23 23 25  '$ GLUCOSE 89 132* 108*  BUN '11 12 11  '$ CALCIUM 8.6* 8.0* 7.8*  CREATININE 0.88 0.72 0.70  GFRNONAA >60 >60 >60  GFRAA >60 >60 >60    LIVER FUNCTION TESTS: Recent  Labs    11/30/16 1347  BILITOT 1.5*  AST 31  ALT 10*  ALKPHOS 69  PROT 5.4*  ALBUMIN 2.1*    TUMOR MARKERS: No results for input(s): AFPTM, CEA, CA199, CHROMGRNA in the last 8760 hours.  Assessment and Plan:  Possible lymphoma.  Will proceed with bone marrow biopsy on Friday.  Risks and benefits discussed with the patient including, but not limited to bleeding, infection, damage to adjacent structures or low yield requiring additional tests.  All of the patient's  questions were answered, patient is agreeable to proceed. Consent signed and in chart.  Thank you for this interesting consult.  I greatly enjoyed meeting [name] [name] and look forward to participating in their care.  A copy of this report was sent to the requesting provider on this date.  Electronically Signed: Murrell Redden, PA-C 12/03/2016, 12:39 PM   I spent a total of 15 Minutes in face to face in clinical consultation, greater than 50% of which was counseling/coordinating care for bone marrow biopsy.

## 2016-12-03 NOTE — Consult Note (Signed)
Subjective:   HPI  The patient is a 66 year old male who resides in a nursing home and who also has a diagnosis of non-Hodgkin's lymphoma. The patient is anemic. He is heme-negative. We were asked to see him by his oncologist because this type of lymphoma does have involvement of the gastrointestinal tract. It was felt that evaluation of his gastrointestinal tract would be appropriate to further investigate his anemia. Patient denies any abdominal pain or rectal bleeding or hematemesis.  Review of Systems No chest pain or shortness of breath  Past Medical History:  Diagnosis Date  . Bladder tumor   . Cataract   . CVA (cerebral infarction)    2008  . Hyperlipidemia   . Hypertension    Past Surgical History:  Procedure Laterality Date  . CHOLECYSTECTOMY    . EYE SURGERY    . FLEXIBLE SIGMOIDOSCOPY N/A 11/30/2013   Procedure: FLEXIBLE SIGMOIDOSCOPY;  Surgeon: Inda Castle, MD;  Location: [facility];  Service: Endoscopy;  Laterality: N/A;  . HOT HEMOSTASIS N/A 11/30/2013   Procedure: HOT HEMOSTASIS (ARGON PLASMA COAGULATION/BICAP);  Surgeon: Inda Castle, MD;  Location: [facility];  Service: Endoscopy;  Laterality: N/A;  . MOUTH SURGERY     Social History   Socioeconomic History  . Marital status: Single    Spouse name: Not on file  . Number of children: Not on file  . Years of education: Not on file  . Highest education level: Not on file  Social Needs  . Financial resource strain: Not on file  . Food insecurity - worry: Not on file  . Food insecurity - inability: Not on file  . Transportation needs - medical: Not on file  . Transportation needs - non-medical: Not on file  Occupational History  . Occupation: disabled  Tobacco Use  . Smoking status: Former Smoker    Last attempt to quit: 09/15/1985    Years since quitting: 31.2  . Smokeless tobacco: Never Used  . Tobacco comment: Started at age 9, quit in the 1980's  Substance and Sexual Activity  . Alcohol use:  No  . Drug use: No  . Sexual activity: Not on file  Other Topics Concern  . Not on file  Social History Narrative  . Not on file   family history includes Colon cancer in his father; Colon cancer (age of onset: 63) in his sister; Hypertension in his sister.  Current Facility-Administered Medications:  .  0.9 %  sodium chloride infusion, 250 mL, Intravenous, Once, Magrinat, [name], MD .  acetaminophen (TYLENOL) tablet 650 mg, 650 mg, Oral, Q4H PRN, Karmen Bongo, MD, 650 mg at 12/02/16 2012 .  allopurinol (ZYLOPRIM) tablet 300 mg, 300 mg, Oral, Daily, Magrinat, [name], MD, 300 mg at 12/03/16 0825 .  aspirin chewable tablet 81 mg, 81 mg, Oral, Daily, Karmen Bongo, MD, 81 mg at 12/03/16 0824 .  diltiazem (CARDIZEM CD) 24 hr capsule 240 mg, 240 mg, Oral, Daily, Skeet Latch, MD, 240 mg at 12/03/16 0825 .  doxycycline (VIBRA-TABS) tablet 100 mg, 100 mg, Oral, Q12H, Mariel Aloe, MD, 100 mg at 12/03/16 0825 .  ferrous sulfate tablet 325 mg, 325 mg, Oral, BID WC, Karmen Bongo, MD, 325 mg at 12/03/16 0825 .  heparin lock flush 100 unit/mL, 500 Units, Intracatheter, Daily PRN, Magrinat, [name], MD .  heparin lock flush 100 unit/mL, 250 Units, Intracatheter, PRN, Magrinat, [name], MD .  loratadine (CLARITIN) tablet 10 mg, 10 mg, Oral, Daily, Karmen Bongo, MD,  10 mg at 12/03/16 0824 .  ondansetron (ZOFRAN) injection 4 mg, 4 mg, Intravenous, Q6H PRN, Karmen Bongo, MD .  pantoprazole (PROTONIX) EC tablet 40 mg, 40 mg, Oral, Daily, Karmen Bongo, MD, 40 mg at 12/03/16 0824 .  polyethylene glycol (MIRALAX / GLYCOLAX) packet 17 g, 17 g, Oral, BID, Mariel Aloe, MD, 17 g at 12/02/16 2012 .  predniSONE (DELTASONE) tablet 50 mg, 50 mg, Oral, Q breakfast, Magrinat, [name], MD, 50 mg at 12/03/16 0824 .  [START ON 12/05/2016] riTUXimab (RITUXAN) 800 mg in sodium chloride 0.9 % 250 mL (2.4242 mg/mL) chemo infusion, 375 mg/m2, Intravenous, Once, Magrinat, [name], MD .   rosuvastatin (CRESTOR) tablet 20 mg, 20 mg, Oral, q1800, Skeet Latch, MD, 20 mg at 12/02/16 1622 .  sodium chloride flush (NS) 0.9 % injection 10 mL, 10 mL, Intracatheter, PRN, Magrinat, [name], MD .  sodium chloride flush (NS) 0.9 % injection 3 mL, 3 mL, Intracatheter, PRN, Magrinat, [name], MD No Known Allergies   Objective:     BP (!) 103/51   Pulse 95   Temp 98.8 F (37.1 C) (Oral)   Resp 18   Ht 5\' 11"  (1.803 m)   Wt 70.6 kg (155 lb 10.3 oz)   SpO2 95%   BMI 21.71 kg/m   He is in no acute distress  Heart regular rhythm no murmurs heard  Lungs clear  Abdomen: Soft and nontender    Laboratory No components found for: D1    Assessment:     Non-Hodgkin's lymphoma  Anemia of unclear etiology      Plan:     I explained to the patient that his oncologist was concerned about the possibility of involvement of his gastrointestinal tract with lymphoma or some other reason from the GI tract to explain his anemia. I explained that we were asked to come to see him in regards to this and that we could further evaluate with EGD and colonoscopy. The patient states he does not want to have these investigations at this time as there is so much going on. He will think about it. If he changes his mind give [place] a call back. In the meanwhile we will sign off. This evaluation if it were to be done could probably be done as an outpatient. Lab Results  Component Value Date   HGB 7.4 (L) 12/03/2016   HGB 7.4 (L) 12/02/2016   HGB 7.7 (L) 12/01/2016   HCT 23.6 (L) 12/03/2016   HCT 23.7 (L) 12/02/2016   HCT 25.0 (L) 12/01/2016   ALKPHOS 69 11/30/2016   AST 31 11/30/2016   ALT 10 (L) 11/30/2016

## 2016-12-04 ENCOUNTER — Other Ambulatory Visit: Payer: Self-pay | Admitting: Oncology

## 2016-12-04 DIAGNOSIS — Z7189 Other specified counseling: Secondary | ICD-10-CM | POA: Insufficient documentation

## 2016-12-04 DIAGNOSIS — C8310 Mantle cell lymphoma, unspecified site: Secondary | ICD-10-CM

## 2016-12-04 LAB — CULTURE, BLOOD (ROUTINE X 2)
CULTURE: NO GROWTH
Culture: NO GROWTH
Special Requests: ADEQUATE
Special Requests: ADEQUATE

## 2016-12-04 LAB — CBC
HEMATOCRIT: 30.2 % — AB (ref 39.0–52.0)
Hemoglobin: 9.5 g/dL — ABNORMAL LOW (ref 13.0–17.0)
MCH: 27.5 pg (ref 26.0–34.0)
MCHC: 31.5 g/dL (ref 30.0–36.0)
MCV: 87.5 fL (ref 78.0–100.0)
PLATELETS: 172 10*3/uL (ref 150–400)
RBC: 3.45 MIL/uL — AB (ref 4.22–5.81)
RDW: 17 % — ABNORMAL HIGH (ref 11.5–15.5)
WBC: 5 10*3/uL (ref 4.0–10.5)

## 2016-12-04 LAB — TYPE AND SCREEN
ABO/RH(D): A POS
Antibody Screen: NEGATIVE
Unit division: 0
Unit division: 0

## 2016-12-04 LAB — COMPREHENSIVE METABOLIC PANEL
ALBUMIN: 2 g/dL — AB (ref 3.5–5.0)
ALT: 12 U/L — AB (ref 17–63)
AST: 30 U/L (ref 15–41)
Alkaline Phosphatase: 74 U/L (ref 38–126)
Anion gap: 7 (ref 5–15)
BUN: 12 mg/dL (ref 6–20)
CHLORIDE: 104 mmol/L (ref 101–111)
CO2: 24 mmol/L (ref 22–32)
Calcium: 8.3 mg/dL — ABNORMAL LOW (ref 8.9–10.3)
Creatinine, Ser: 0.73 mg/dL (ref 0.61–1.24)
GFR calc Af Amer: >60 mL/min (ref >60)
GLUCOSE: 117 mg/dL — AB (ref 65–99)
POTASSIUM: 4.4 mmol/L (ref 3.5–5.1)
Sodium: 135 mmol/L (ref 135–145)
Total Bilirubin: 1.9 mg/dL — ABNORMAL HIGH (ref 0.3–1.2)
Total Protein: 5.4 g/dL — ABNORMAL LOW (ref 6.5–8.1)

## 2016-12-04 LAB — BPAM RBC
BLOOD PRODUCT EXPIRATION DATE: 201812102359
Blood Product Expiration Date: 201812102359
ISSUE DATE / TIME: 201811211334
ISSUE DATE / TIME: 201811211018
UNIT TYPE AND RH: 6200
Unit Type and Rh: 6200

## 2016-12-04 LAB — PHOSPHORUS: Phosphorus: 3.7 mg/dL (ref 2.5–4.6)

## 2016-12-04 LAB — HEPATITIS C ANTIBODY

## 2016-12-04 LAB — HEPATITIS B CORE ANTIBODY, TOTAL: Hep B Core Total Ab: NEGATIVE

## 2016-12-04 LAB — LACTATE DEHYDROGENASE: LDH: 205 U/L — AB (ref 98–192)

## 2016-12-04 LAB — URIC ACID: Uric Acid, Serum: 2.1 mg/dL — ABNORMAL LOW (ref 4.4–7.6)

## 2016-12-04 LAB — HEPATITIS B SURFACE ANTIGEN: HEP B S AG: NEGATIVE

## 2016-12-04 MED ORDER — AMIODARONE HCL 200 MG PO TABS
400.0000 mg | ORAL_TABLET | Freq: Two times a day (BID) | ORAL | Status: DC
  Administered 2016-12-04 – 2016-12-06 (×5): 400 mg via ORAL
  Filled 2016-12-04 (×5): qty 2

## 2016-12-04 NOTE — Progress Notes (Signed)
Progress Note  Patient Name: [name] [name] Date of Encounter: 12/04/2016  Primary Cardiologist: Dr. Percival Spanish  Subjective   Feeling well. No chest pain, shortness of breath or palpitations.    Inpatient Medications    Scheduled Meds: . allopurinol  300 mg Oral Daily  . aspirin  81 mg Oral Daily  . diltiazem  240 mg Oral Daily  . ferrous sulfate  325 mg Oral BID WC  . loratadine  10 mg Oral Daily  . metoprolol tartrate  12.5 mg Oral BID  . pantoprazole  40 mg Oral Daily  . polyethylene glycol  17 g Oral BID  . predniSONE  50 mg Oral Q breakfast  . [START ON 12/05/2016] riTUXimab (RITUXAN) IV infusion  375 mg/m2 Intravenous Once  . rosuvastatin  20 mg Oral q1800   Continuous Infusions: . sodium chloride     PRN Meds: acetaminophen, heparin lock flush, heparin lock flush, ondansetron (ZOFRAN) IV, sodium chloride flush, sodium chloride flush   Vital Signs    Vitals:   12/03/16 2245 12/04/16 0030 12/04/16 0322 12/04/16 0808  BP: 100/83 116/72 123/67 122/77  Pulse: 85 (!) 104 91 (!) 105  Resp:  (!) 24 20 (!) 25  Temp:  97.8 F (36.6 C) 98 F (36.7 C) (!) 97.5 F (36.4 C)  TempSrc:  Oral Oral Oral  SpO2:  98% 96% 97%  Weight:   88.7 kg (195 lb 9.6 oz)   Height:        Intake/Output Summary (Last 24 hours) at 12/04/2016 1010 Last data filed at 12/03/2016 2100 Gross per 24 hour  Intake 1275 ml  Output 300 ml  Net 975 ml   Filed Weights   12/03/16 0431 12/03/16 1704 12/04/16 0322  Weight: 70.6 kg (155 lb 10.3 oz) 88.6 kg (195 lb 6.4 oz) 88.7 kg (195 lb 9.6 oz)    Telemetry    Atrial fibrillation.  Rate 80s-140s - Personally Reviewed  ECG    N/A- Personally Reviewed  Physical Exam   VS:  BP 122/77 (BP Location: Left Arm)   Pulse (!) 105   Temp (!) 97.5 F (36.4 C) (Oral)   Resp (!) 25   Ht 5\' 11"  (1.803 m)   Wt 88.7 kg (195 lb 9.6 oz)   SpO2 97%   BMI 27.28 kg/m  , BMI Body mass index is 27.28 kg/m. GENERAL:  Well appearing HEENT: Pupils  equal round and reactive, fundi not visualized, oral mucosa unremarkable NECK:  No jugular venous distention, waveform within normal limits, carotid upstroke brisk and symmetric, no bruits, no thyromegaly LUNGS:  Clear to auscultation bilaterally HEART:  Irregularly irregular.  PMI not displaced or sustained,S1 and S2 within normal limits, no S3, no S4, no clicks, no rubs, no murmurs ABD:  Flat, positive bowel sounds normal in frequency in pitch, no bruits, no rebound, no guarding, no midline pulsatile mass, no hepatomegaly, no splenomegaly EXT:  2 plus pulses throughout, no edema, no cyanosis no clubbing.  Right upper extremity much larger than left. SKIN:  No rashes no nodules NEURO:  Cranial nerves II through XII grossly intact, motor grossly intact throughout PSYCH: Mild cognitive dysfunction.  Oriented to person place and time   Labs    Chemistry Recent Labs  Lab 11/30/16 1347 12/01/16 0426 12/04/16 0313  NA 133* 136 135  K 3.5 3.3* 4.4  CL 100* 103 104  CO2 23 25 24   GLUCOSE 132* 108* 117*  BUN 12 11 12   CREATININE 0.72  0.70 0.73  CALCIUM 8.0* 7.8* 8.3*  PROT 5.4*  --  5.4*  ALBUMIN 2.1*  --  2.0*  AST 31  --  30  ALT 10*  --  12*  ALKPHOS 69  --  74  BILITOT 1.5*  --  1.9*  GFRNONAA >60 >60 >60  GFRAA >60 >60 >60  ANIONGAP 10 8 7      Hematology Recent Labs  Lab 12/02/16 0327 12/03/16 0357 12/03/16 2205 12/04/16 0313  WBC 4.5 4.3  --  5.0  RBC 2.67* 2.64*  2.64*  --  3.45*  HGB 7.4* 7.4* 9.2* 9.5*  HCT 23.7* 23.6* 28.8* 30.2*  MCV 88.8 89.4  --  87.5  MCH 27.7 28.0  --  27.5  MCHC 31.2 31.4  --  31.5  RDW 17.3* 17.4*  --  17.0*  PLT 167 164  --  172    Cardiac Enzymes Recent Labs  Lab 11/27/16 2130 11/30/16 2122 12/01/16 0426 12/01/16 0813  TROPONINI <0.03 <0.03 <0.03 <0.03   No results for input(s): TROPIPOC in the last 168 hours.   BNPNo results for input(s): BNP, PROBNP in the last 168 hours.   DDimer No results for input(s): DDIMER in the  last 168 hours.   Radiology    No results found.  Cardiac Studies   Echo 11/29/16: Study Conclusions  - Left ventricle: Wall thickness was increased in a pattern of   moderate LVH. Systolic function was normal. The estimated   ejection fraction was in the range of 55% to 60%. Wall motion was   normal; there were no regional wall motion abnormalities. The   study is not technically sufficient to allow evaluation of LV   diastolic function. - Aortic valve: Trileaflet. Sclerosis without stenosis. There was   trivial regurgitation. - Mitral valve: Mildly thickened leaflets . There was trivial   regurgitation. - Left atrium: Moderately dilated. - Right ventricle: The cavity size was mildly dilated. Low normal   systolic function. - Right atrium: Moderately dilated. - Tricuspid valve: There was mild regurgitation. - Pulmonary arteries: PA peak pressure: 35 mm Hg (S). - Inferior vena cava: The vessel was normal in size. The   respirophasic diameter changes were in the normal range (>= 50%),   consistent with normal central venous pressure. - Pericardium, extracardiac: A trivial pericardial effusion was   identified posterior to the heart. Features were not consistent   with tamponade physiology.  Impressions:  - LVEF 55-60%, moderate LVH, normal wall motion, aortic valve   sclerosis with trivial AI, moderate biatrial enlargement, mild   RVE with low normal RV systolic function, mild TR, RVSP 35 mmHg,   normal IVC, trivial posterior pericardial effusion.  Patient Profile     [name] [name] is a 25M with prior stroke, hypertension, hyperlipidemia, and anemia who was undergoing workup for his anemia with a biopsy when he was found to go into atrial fibrillation with rapid ventricular response.  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   Assessment & Plan    # Paroxysmal  atrial fibrillation:  # Tachy/brady syndrome: [name] [name] remains in atrial fibrillation.  His heart rate was better-controlled, but this AM it went back into the 140s. He is currently on prednisone, which may be affecting his rate control.  He is asymptomatic.  He has also been bradycardic at times and has pauses up to 2 seconds.  BP is in  the 90s at times.  Therefore we won't titrate his nodal agents.  Will start amiodarone 400mg  bid for rate control.  Plan for 5g load.  No anticoagulation 2/2 anemia.  Continue aspirin as tolerated.  Hemoglobin up to 9.5 today.    # Hypertension: BP well-controlled on diltiazem and metoprolol.  For questions or updates, please contact [place] Please consult [email or web address] for contact info under Cardiology/STEMI.      Signed, Skeet Latch, MD  12/04/2016, 10:10 AM

## 2016-12-04 NOTE — Progress Notes (Signed)
PROGRESS NOTE  [name] [name] MOQ:[id number] DOB: 1950/10/21 DOA: 11/27/2016 PCP: Wenda Low, MD  Brief Narrative: 59yom presented to IR as outpt for elective biopsy of neck mass, found to have afib with RVR and sent to ED. Admitted for Afib/RVR  Assessment/Plan Afib/RVR. TSH WNL. CHA2DS2-VASc 6. Echo showed normal LVEF. - afib on tele, one episode of bradycardia. HR 110s now. Management per cardiology at this point: diltiazem, metoprolol; amiodarone added. - eventual consideration of anticoagulation recommended per cardiology; will defer to outpatient setting   Normocytic anemia, reported hematuria, hematochezia, oral bleeding.  - no bleeding here. Hgb improved with transfusion PRBC 11/21. No evidence of hemolysis. Oncology suspects bleeding as etiology and consulted GI as this lymphoma can involve the GI tract. GI saw pt and pt refused endoscopy. 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 Rituximab deferred to 11/26. Bone marrow biopsy ordered.  PMH CVA 2008  Essential HTN - remains stable  Aortic atherosclerosis - follow-up as an outpatient   Further recs per cardiology for ongoing tachycardia.  CBC in AM, hopefully also bone marrow biopsy in AM  DVT prophylaxis: SCDs Code Status: full Family Communication: none Disposition Plan: return to [facility], [place]  [facility] Direct contact: [phone] --Via amion app OR  --[email or web address]; password [email or web address]  7PM-7AM contact night coverage as above 12/04/2016, 9:46 AM  LOS: 7 days   Consultants:  Cardiology  IR  Oncology   GI  Procedures:  11/16 Technically successful US guided biopsy of indeterminate right cervical lymph node.  11/21 transfusion 2 units PRBC  Echo Impressions:  - LVEF 55-60%, moderate LVH, normal wall motion, aortic valve   sclerosis with trivial AI, moderate biatrial enlargement,  mild   RVE with low normal RV systolic function, mild TR, RVSP 35 mmHg,   normal IVC, trivial posterior pericardial effusion.  Antimicrobials:    Interval history/Subjective: Yesterday cardiology rec adding metoprolol, cont diltiazem, not a candidate for anticoagulation at this time.  Feels ok, no complaints.  Objective: Vitals:  Vitals:   12/04/16 0322 12/04/16 0808  BP: 123/67 122/77  Pulse: 91 (!) 105  Resp: 20 (!) 25  Temp: 98 F (36.7 C) (!) 97.5 F (36.4 C)  SpO2: 96% 97%    Exam: Constitutional:   . Appears calm and comfortable lying flat in bed Eyes:  . pupils and irises appear normal ENMT:  . grossly normal hearing  Respiratory:  . CTA bilaterally, no w/r/r.  . Respiratory effort normal.  Cardiovascular:  . Irregular, tachycardic, no m/r/g . No LE extremity edema   Abdomen:  . Abdomen appears normal; no tenderness or masses . No hernias . No HSM Psychiatric:  . Mental status o Mood, affect appropriate . judgement and insight appear normal. Aware of bone marrow biopsy.   I have personally reviewed the following:   Labs:  Hgb stable 9.5 s/p transfusion. Plts and WBC WNL  BMP unremarkable. Mild elevation of t bili noted.  Imaging studies:    Scheduled Meds: . allopurinol  300 mg Oral Daily  . aspirin  81 mg Oral Daily  . diltiazem  240 mg Oral Daily  . ferrous sulfate  325 mg Oral BID WC  . loratadine  10 mg Oral Daily  . metoprolol tartrate  12.5 mg Oral BID  . pantoprazole  40 mg Oral Daily  . polyethylene glycol  17 g Oral BID  .  predniSONE  50 mg Oral Q breakfast  . [START ON 12/05/2016] riTUXimab (RITUXAN) IV infusion  375 mg/m2 Intravenous Once  . rosuvastatin  20 mg Oral q1800   Continuous Infusions: . sodium chloride      Principal Problem:   Atrial fibrillation with RVR (HCC) Active Problems:   Mild intellectual disability   Essential hypertension   Constipation   Hyperlipidemia   Normocytic anemia    Lymphadenopathy   Mantle cell lymphoma ([place])   LOS: 7 days

## 2016-12-04 NOTE — Progress Notes (Signed)
START ON PATHWAY REGIMEN - Lymphoma and CLL     A cycle is every 28 days:     Bendamustine      Rituximab   **Always confirm dose/schedule in your pharmacy ordering system**    Patient Characteristics: Mantle Cell Lymphoma, First Line, Stage II - IV, Transplant Ineligible Disease Type: Not Applicable Disease Type: Mantle Cell Lymphoma Disease Type: Not Applicable Line of Therapy: First Line [place] Stage: III Eligible for Transplant<= Transplant Ineligible Intent of Therapy: Non-Curative / Palliative Intent, Discussed with Patient 

## 2016-12-04 NOTE — Progress Notes (Signed)
[name] [name]   DOB:1950-05-01   TI#:[id number]   ASN#:[id number]  Subjective:  Currently day 2 prednisone; s/p transfusion yesterday with Hb up 2 points; scheduled for bmbx tomorrow; refused endoscopy  Objective: middle aged White man Vitals:   12/04/16 0322 12/04/16 0808  BP: 123/67 122/77  Pulse: 91 (!) 105  Resp: 20 (!) 25  Temp: 98 F (36.7 C) (!) 97.5 F (36.4 C)  SpO2: 96% 97%    Body mass index is 27.28 kg/m.  Intake/Output Summary (Last 24 hours) at 12/04/2016 0816 Last data filed at 12/03/2016 2100 Gross per 24 hour  Intake 1635 ml  Output 300 ml  Net 1335 ml     CBG (last 3)  No results for input(s): GLUCAP in the last 72 hours.   Labs:  Lab Results  Component Value Date   WBC 5.0 12/04/2016   HGB 9.5 (L) 12/04/2016   HCT 30.2 (L) 12/04/2016   MCV 87.5 12/04/2016   PLT 172 12/04/2016    '@LASTCHEMISTRY'$ @  Urine Studies No results for input(s): UHGB, CRYS in the last 72 hours.  Invalid input(s): UACOL, UAPR, USPG, UPH, UTP, UGL, UKET, UBIL, UNIT, UROB, ULEU, UEPI, UWBC, URBC, UBAC, CAST, [place], [place]  Basic Metabolic Panel: Recent Labs  Lab 11/27/16 1357 11/27/16 1515 11/30/16 1347 12/01/16 0426 12/04/16 0313  NA 135  --  133* 136 135  K 3.8  --  3.5 3.3* 4.4  CL 99*  --  100* 103 104  CO2 23  --  '23 25 24  '$ GLUCOSE 89  --  132* 108* 117*  BUN 11  --  '12 11 12  '$ CREATININE 0.88  --  0.72 0.70 0.73  CALCIUM 8.6*  --  8.0* 7.8* 8.3*  MG  --  2.0  --   --   --   PHOS  --   --   --   --  3.7   GFR Estimated Creatinine Clearance: 96.7 mL/min (by C-G formula based on SCr of 0.73 mg/dL). Liver Function Tests: Recent Labs  Lab 11/30/16 1347 12/04/16 0313  AST 31 30  ALT 10* 12*  ALKPHOS 69 74  BILITOT 1.5* 1.9*  PROT 5.4* 5.4*  ALBUMIN 2.1* 2.0*   No results for input(s): LIPASE, AMYLASE in the last 168 hours. No results for input(s): AMMONIA in the last 168 hours. Coagulation profile Recent Labs  Lab 11/27/16 1150 11/27/16 1515  INR  1.21 1.23    CBC: Recent Labs  Lab 11/30/16 0447 12/01/16 0426 12/02/16 0327 12/03/16 0357 12/03/16 2205 12/04/16 0313  WBC 4.5 4.0 4.5 4.3  --  5.0  HGB 8.0* 7.7* 7.4* 7.4* 9.2* 9.5*  HCT 26.3* 25.0* 23.7* 23.6* 28.8* 30.2*  MCV 88.0 88.7 88.8 89.4  --  87.5  PLT 177 151 167 164  --  172   Cardiac Enzymes: Recent Labs  Lab 11/27/16 2130 11/30/16 2122 12/01/16 0426 12/01/16 0813  TROPONINI <0.03 <0.03 <0.03 <0.03   BNP: Invalid input(s): POCBNP CBG: No results for input(s): GLUCAP in the last 168 hours. D-Dimer No results for input(s): DDIMER in the last 72 hours. Hgb A1c No results for input(s): HGBA1C in the last 72 hours. Lipid Profile No results for input(s): CHOL, HDL, LDLCALC, TRIG, CHOLHDL, LDLDIRECT in the last 72 hours. Thyroid function studies No results for input(s): TSH, T4TOTAL, T3FREE, THYROIDAB in the last 72 hours.  Invalid input(s): FREET3 Anemia work up Recent Labs    12/02/16 2012 12/03/16 0357  FOLATE 8.9  --  RETICCTPCT  --  4.0*   Microbiology Recent Results (from the past 240 hour(s))  MRSA PCR Screening     Status: Abnormal   Collection Time: 11/27/16  9:53 PM  Result Value Ref Range Status   MRSA by PCR POSITIVE (A) NEGATIVE Final    Comment:        The GeneXpert MRSA Assay (FDA approved for NASAL specimens only), is one component of a comprehensive MRSA colonization surveillance program. It is not intended to diagnose MRSA infection nor to guide or monitor treatment for MRSA infections. RESULT CALLED TO, READ BACK BY AND VERIFIED WITH: RN Madolyn Frieze [phone]   Culture, blood (routine x 2)     Status: None (Preliminary result)   Collection Time: 11/29/16 12:18 AM  Result Value Ref Range Status   Specimen Description BLOOD LEFT ANTECUBITAL  Final   Special Requests IN PEDIATRIC BOTTLE Blood Culture adequate volume  Final   Culture NO GROWTH 4 DAYS  Final   Report Status PENDING  Incomplete  Culture, blood  (routine x 2)     Status: None (Preliminary result)   Collection Time: 11/29/16 12:18 AM  Result Value Ref Range Status   Specimen Description BLOOD LEFT HAND  Final   Special Requests IN PEDIATRIC BOTTLE Blood Culture adequate volume  Final   Culture NO GROWTH 4 DAYS  Final   Report Status PENDING  Incomplete  Culture, blood (routine x 2)     Status: None (Preliminary result)   Collection Time: 11/30/16  9:25 PM  Result Value Ref Range Status   Specimen Description BLOOD LEFT HAND  Final   Special Requests   Final    BOTTLES DRAWN AEROBIC ONLY Blood Culture adequate volume   Culture NO GROWTH 3 DAYS  Final   Report Status PENDING  Incomplete  Culture, blood (routine x 2)     Status: None (Preliminary result)   Collection Time: 11/30/16  9:30 PM  Result Value Ref Range Status   Specimen Description BLOOD LEFT WRIST  Final   Special Requests IN PEDIATRIC BOTTLE Blood Culture adequate volume  Final   Culture NO GROWTH 3 DAYS  Final   Report Status PENDING  Incomplete      Studies:  Dg Chest 2 View  Result Date: 11/30/2016 CLINICAL DATA:  Fever today. EXAM: CHEST  2 VIEW COMPARISON:  CT chest 11/27/2016.  Chest radiograph 11/27/2016. FINDINGS: Mild cardiac enlargement. No vascular congestion. No airspace disease or consolidation in the lungs. No blunting of costophrenic angles. No pneumothorax. Calcification of the aorta. Prominent lymphadenopathy demonstrated at CT is not well visualized radiographically. IMPRESSION: Cardiac enlargement.  No evidence of active pulmonary disease. Electronically Signed   By: Lucienne Capers M.D.   On: 11/30/2016 23:36   Ct Soft Tissue Neck Wo Contrast  Result Date: 11/21/2016 CLINICAL DATA:  Initial evaluation for right-sided neck swelling. EXAM: CT NECK WITHOUT CONTRAST TECHNIQUE: Multidetector CT imaging of the neck was performed following the standard protocol without intravenous contrast. COMPARISON:  None available. FINDINGS: Pharynx and larynx:  Oral cavity within normal limits without mass lesion or loculated fluid collection. Patient is edentulous. Palatine tonsils symmetric and within normal limits bilaterally. Few small calcified tonsilliths noted. Parapharyngeal fat preserved. Nasopharynx within normal limits. Retropharyngeal soft tissues demonstrate no acute abnormality. Epiglottis normal. Vallecula clear. Small amount of layering secretions present within the right hypopharynx, extending into the right piriform sinus. Remainder of the hypopharynx and supraglottic larynx grossly normal. True cords symmetric and  within normal limits. Subglottic airway clear. Salivary glands: Salivary glands including the parotid and submandibular glands are within normal limits bilaterally. Thyroid: Thyroid within normal limits. Lymph nodes: Extensive bulky adenopathy seen throughout the neck and visualized upper chest, slightly worse on the right as compared to the left. Nodes are seen at essentially all levels, most prevalent within the right supraclavicular and axillary region. For reference purposes, largest discrete node within the right supraclavicular region measures approximately 2.6 x 5.1 cm (series 2, image 85). Largest discrete nodal mass within the right axillary region measures approximately 9.4 x 3.7 cm (series 2, image 114). In the left neck, largest discrete node seen at left level III and measures 1.8 x 2.7 cm (series 2, image 71). Largest node within the left upper chest seen within the left subpectoral region and measures 5.4 x 2.9 cm (series 2, image 106). Study nodes seen within the partially visualized upper mediastinum as well, largest of which measures 1 cm in the prevascular region. Findings highly concerning for lymphoproliferative disorder/ lymphoma. Vascular: Atherosclerotic changes noted about the aortic arch and carotid bifurcations. Limited intracranial: Unremarkable. Visualized orbits: Partially visualized globes and orbital soft tissues  within normal limits. Mastoids and visualized paranasal sinuses: Chronic maxillary sinusitis noted, right worse than left. Retained metallic density at the posterior right maxillary sinus. Sequelae of prior ORIF at the anterior maxillary sinuses/alveolar ridge. Visualize mastoids and middle ear cavities are clear. Skeleton: No acute osseus abnormality. No worrisome lytic or blastic osseous lesions. Straightening of the normal cervical lordosis with trace retrolisthesis of C3 on C4. Moderate to advanced degenerate spondylolysis at C5-6. Upper chest: Partially visualized lungs are clear. Other: None. IMPRESSION: 1. Extensive bulky adenopathy throughout the neck and visualized upper chest, right worse than left, highly concerning for lymphoproliferative disorder/lymphoma. 2. No other acute abnormality within the neck. 3. Chronic maxillary sinusitis. Electronically Signed   By: Jeannine Boga M.D.   On: 11/21/2016 15:27   Ct Angio Chest Pe W And/or Wo Contrast  Result Date: 11/27/2016 CLINICAL DATA:  Tachycardia. Suspect pulmonary embolism. History of hypertension, ex smoker, bladder tumor. Scheduled for neck biopsy today. EXAM: CT ANGIOGRAPHY CHEST WITH CONTRAST TECHNIQUE: Multidetector CT imaging of the chest was performed using the standard protocol during bolus administration of intravenous contrast. Multiplanar CT image reconstructions and MIPs were obtained to evaluate the vascular anatomy. CONTRAST:  175m ISOVUE-370 IOPAMIDOL (ISOVUE-370) INJECTION 76% COMPARISON:  CT neck November 21, 2016 and chest radiograph November 27, 2016 and CT abdomen and pelvis June 22, 2012 FINDINGS: CARDIOVASCULAR: Adequate contrast opacification of the pulmonary artery's. Main pulmonary artery is not enlarged. No pulmonary arterial filling defects to the level of the segmental branches, distal branches obscured by respiratory motion. Heart is upper limits of normal in size. Mild coronary artery calcifications. Small  pericardial effusion. Thoracic aorta is normal course and caliber, unremarkable. MEDIASTINUM/NODES: Greater than expected number of mildly enlarged mediastinal and hilar lymph nodes. Anterior mediastinal fat stranding. Bulky RIGHT greater than LEFT axillary lymphadenopathy, 12.9 x 6.7 cm RIGHT axillary nodal conglomeration. Bulky RIGHT grand LEFT supraclavicular lymphadenopathy. LUNGS/PLEURA: Tracheobronchial tree is patent, no pneumothorax. No pleural effusions, focal consolidations, pulmonary nodules or masses. UPPER ABDOMEN: New splenomegaly. 6.7 cm cyst (5 Hounsfield units) LEFT renal fossa, larger than prior CT. Stable 3 cm cyst (3 Hounsfield units) RIGHT lobe of the liver. Partially imaged probable portal caval lymphadenopathy. MUSCULOSKELETAL: Nonacute. RIGHT biceps intramuscular lipoma versus atrophy. Multilevel scattered Schmorl's nodes. Review of the MIP images confirms the above findings.  IMPRESSION: 1. No acute pulmonary embolism. 2. Bulky supraclavicular, axillary and possibly portal caval lymphadenopathy. 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 3. Borderline cardiomegaly.  No acute pulmonary process. Aortic Atherosclerosis (ICD10-I70.0). Electronically Signed   By: Elon Alas M.D.   On: 11/27/2016 17:13   Dg Chest Port 1 View  Result Date: 11/27/2016 CLINICAL DATA:  Atrial fibrillation EXAM: PORTABLE CHEST 1 VIEW COMPARISON:  None. FINDINGS: Cardiac silhouette is upper limits of normal. Mild aortic atherosclerotic calcification. No focal airspace consolidation or pulmonary edema. No pneumothorax or sizable pleural effusion. IMPRESSION: No active disease. Aortic Atherosclerosis (ICD10-I70.0). Electronically Signed   By: Ulyses Jarred M.D.   On: 11/27/2016 15:17   [place] Core Biopsy (lymph Nodes)  Result Date: 11/28/2016 INDICATION: Bulky cervical axillary lymphadenopathy. Please perform ultrasound-guided biopsy for tissue diagnostic purposes. EXAM:  ULTRASOUND-GUIDED RIGHT CERVICAL LYMPH NODE BIOPSY COMPARISON:  Neck CT - 11/21/2016; chest CT - 11/17/2016 MEDICATIONS: None ANESTHESIA/SEDATION: Moderate (conscious) sedation was employed during this procedure. A total of Versed 2 mg and Fentanyl 75 mcg was administered intravenously. Moderate Sedation Time: 10 minutes. The patient's level of consciousness and vital signs were monitored continuously by radiology nursing throughout the procedure under my direct supervision. COMPLICATIONS: None immediate. TECHNIQUE: Informed written consent was obtained from the patient after a discussion of the risks, benefits and alternatives to treatment. Questions regarding the procedure were encouraged and answered. Initial ultrasound scanning demonstrated multiple enlarged cervical lymph nodes. A dominant approximately 3.2 x 1.6 cm lymph node within the superficial aspect of the mid right lateral neck was targeted for biopsy given lymph node location and sonographic window. An ultrasound image was saved for documentation purposes. The procedure was planned. A timeout was performed prior to the initiation of the procedure. The operative was prepped and draped in the usual sterile fashion, and a sterile drape was applied covering the operative field. A timeout was performed prior to the initiation of the procedure. Local anesthesia was provided with 1% lidocaine with epinephrine. Under direct ultrasound guidance, an 18 gauge core needle device was utilized to obtain to obtain 6 core needle biopsies of the dominant right cervical lymph node. The samples were placed in saline and submitted to pathology. The needle was removed and hemostasis was achieved with manual compression. Post procedure scan was negative for significant hematoma. A dressing was placed. The patient tolerated the procedure well without immediate postprocedural complication. IMPRESSION: Technically successful ultrasound guided biopsy of dominant right cervical  lymph node. Electronically Signed   By: Sandi Mariscal M.D.   On: 11/28/2016 13:48   US Soft Tissue Neck  Result Date: 12/01/2016 CLINICAL DATA:  66 year old male with a history of neck mass, prior biopsy, possible infection EXAM: ULTRASOUND OF HEAD/NECK SOFT TISSUES TECHNIQUE: Ultrasound examination of the head and neck soft tissues was performed in the area of clinical concern. COMPARISON:  Ultrasound 11/28/2016, prior CT 11/27/2016 FINDINGS: Grayscale and color duplex ultrasound formed in the region of clinical concern. Multiple enlarged lymph nodes again evident, as previously demonstrated on ultrasound and CT imaging. No focal fluid collection. Post biopsy changes of targeted lymph node are evident without fluid collection, gas, or significant edema. IMPRESSION: Ultrasound survey in the region of clinical concern demonstrates no evidence of focal fluid collection or gas. Expected changes of prior biopsy of the targeted enlarged cervical lymph nodes. Electronically Signed   By: Corrie Mckusick D.O.   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 He is scheduled for bone marrow biopsy tomorrow. He has declined EGD  If  he is clinically stable tomorrow he can be discharged from an oncology point of view--we will set him up for treatment in the clinic next week.  Note prednisone stops after 11/25 dose. He is to continue on allopurinol.  If he is not discharged tomorrow we will see over the weekend. I will be out of town until Monday.   Greatly appreciate your help to this complex patient!    Chauncey Cruel, MD 12/04/2016  8:16 AM Medical Oncology and Hematology [facility] [address] [place], [place] [address] Tel. [phone]    Fax. [phone]

## 2016-12-04 NOTE — Plan of Care (Signed)
VSS and WNL 

## 2016-12-05 ENCOUNTER — Ambulatory Visit (HOSPITAL_COMMUNITY)
Admission: RE | Admit: 2016-12-05 | Discharge: 2016-12-05 | Disposition: A | Discharge status: Home / Self Care | Payer: Medicare Other | Source: Ambulatory Visit | Attending: Internal Medicine | Admitting: Internal Medicine

## 2016-12-05 ENCOUNTER — Encounter (HOSPITAL_COMMUNITY): Payer: Self-pay

## 2016-12-05 ENCOUNTER — Inpatient Hospital Stay (HOSPITAL_COMMUNITY): Payer: Medicare Other

## 2016-12-05 LAB — CBC
HEMATOCRIT: 28.4 % — AB (ref 39.0–52.0)
HEMOGLOBIN: 9 g/dL — AB (ref 13.0–17.0)
MCH: 28.1 pg (ref 26.0–34.0)
MCHC: 31.7 g/dL (ref 30.0–36.0)
MCV: 88.8 fL (ref 78.0–100.0)
Platelets: 193 10*3/uL (ref 150–400)
RBC: 3.2 MIL/uL — ABNORMAL LOW (ref 4.22–5.81)
RDW: 17.2 % — AB (ref 11.5–15.5)
WBC: 4.5 10*3/uL (ref 4.0–10.5)

## 2016-12-05 LAB — CULTURE, BLOOD (ROUTINE X 2)
Culture: NO GROWTH
Culture: NO GROWTH
Special Requests: ADEQUATE
Special Requests: ADEQUATE

## 2016-12-05 LAB — COMPREHENSIVE METABOLIC PANEL
ALBUMIN: 2 g/dL — AB (ref 3.5–5.0)
ALK PHOS: 63 U/L (ref 38–126)
ALT: 14 U/L — ABNORMAL LOW (ref 17–63)
ANION GAP: 6 (ref 5–15)
AST: 33 U/L (ref 15–41)
BILIRUBIN TOTAL: 1.4 mg/dL — AB (ref 0.3–1.2)
BUN: 17 mg/dL (ref 6–20)
CALCIUM: 8.4 mg/dL — AB (ref 8.9–10.3)
CO2: 27 mmol/L (ref 22–32)
Chloride: 103 mmol/L (ref 101–111)
Creatinine, Ser: 0.72 mg/dL (ref 0.61–1.24)
GFR calc Af Amer: >60 mL/min (ref >60)
GLUCOSE: 117 mg/dL — AB (ref 65–99)
Potassium: 4.6 mmol/L (ref 3.5–5.1)
Sodium: 136 mmol/L (ref 135–145)
TOTAL PROTEIN: 5.4 g/dL — AB (ref 6.5–8.1)

## 2016-12-05 LAB — URINALYSIS, ROUTINE W REFLEX MICROSCOPIC
BILIRUBIN URINE: NEGATIVE
GLUCOSE, UA: NEGATIVE mg/dL
HGB URINE DIPSTICK: NEGATIVE
Ketones, ur: NEGATIVE mg/dL
Leukocytes, UA: NEGATIVE
Nitrite: NEGATIVE
Protein, ur: NEGATIVE mg/dL
SPECIFIC GRAVITY, URINE: 1.021 (ref 1.005–1.030)
pH: 5 (ref 5.0–8.0)

## 2016-12-05 LAB — URIC ACID: Uric Acid, Serum: 1.8 mg/dL — ABNORMAL LOW (ref 4.4–7.6)

## 2016-12-05 LAB — LACTATE DEHYDROGENASE: LDH: 177 U/L (ref 98–192)

## 2016-12-05 LAB — PHOSPHORUS: Phosphorus: 4.2 mg/dL (ref 2.5–4.6)

## 2016-12-05 MED ORDER — METOPROLOL TARTRATE 25 MG PO TABS
25.0000 mg | ORAL_TABLET | Freq: Two times a day (BID) | ORAL | Status: DC
  Administered 2016-12-05: 25 mg via ORAL
  Filled 2016-12-05: qty 1

## 2016-12-05 MED ORDER — FENTANYL CITRATE (PF) 100 MCG/2ML IJ SOLN
INTRAMUSCULAR | Status: AC
  Filled 2016-12-05: qty 4

## 2016-12-05 MED ORDER — MIDAZOLAM HCL 2 MG/2ML IJ SOLN
INTRAMUSCULAR | Status: AC
Start: 2016-12-05 — End: 2016-12-05
  Filled 2016-12-05: qty 4

## 2016-12-05 MED ORDER — SODIUM CHLORIDE 0.9 % IV SOLN
INTRAVENOUS | Status: DC | PRN
  Administered 2016-12-05: 10 mL/h via INTRAVENOUS

## 2016-12-05 MED ORDER — MIDAZOLAM HCL 2 MG/2ML IJ SOLN
INTRAMUSCULAR | Status: DC | PRN
  Administered 2016-12-05: 1 mg via INTRAVENOUS

## 2016-12-05 MED ORDER — FENTANYL CITRATE (PF) 100 MCG/2ML IJ SOLN
INTRAMUSCULAR | Status: DC | PRN
  Administered 2016-12-05: 50 ug via INTRAVENOUS

## 2016-12-05 MED ORDER — LIDOCAINE HCL 1 % IJ SOLN
INTRAMUSCULAR | Status: AC
  Filled 2016-12-05: qty 20

## 2016-12-05 NOTE — Sedation Documentation (Signed)
Patient is resting comfortably. 

## 2016-12-05 NOTE — Progress Notes (Signed)
PROGRESS NOTE  [name] [name] JEH:[id number] DOB: 07-22-1950 DOA: 11/27/2016 PCP: Wenda Low, MD  Brief Narrative: 52yom presented to IR as outpt for elective biopsy of neck mass, found to have afib with RVR and sent to ED. Admitted for Afib/RVR  Assessment/Plan Afib/RVR. TSH WNL. CHA2DS2-VASc 6. Echo showed normal LVEF. - HR control better. Cardiology increased metoprolol. Continue diltiazem, metoprolol, amiodarone   - eventual consideration of anticoagulation will be deferred to outpatient setting   Normocytic anemia, reported hematuria, hematochezia, oral bleeding.  - Hgb slightly lower. Hgb improved with transfusion PRBC 11/21. No evidence of hemolysis. Oncology suspects bleeding as etiology and consulted GI as this lymphoma can involve the GI tract. GI saw pt and pt refused endoscopy. - hematuria noted. Check U/a. Patient asymptomatic. 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 Rituximab deferred to 11/26. Bone marrow biopsy done 11/23  PMH CVA 2008  Essential HTN - remains stable  Aortic atherosclerosis - follow-up as an outpatient   Anticipate d/c when HR controlled.  Check U/A  Check CBC in AM  DVT prophylaxis: SCDs Code Status: full Family Communication: none Disposition Plan: return to [facility], [place]  [facility] Direct contact: [phone] --Via amion app OR  --[email or web address]; password [email or web address]  7PM-7AM contact night coverage as above 12/05/2016, 3:32 PM  LOS: 8 days   Consultants:  Cardiology  IR  Oncology   GI  Procedures:  11/16 Technically successful US guided biopsy of indeterminate right cervical lymph node.  11/21 transfusion 2 units PRBC  Echo Impressions:  - LVEF 55-60%, moderate LVH, normal wall motion, aortic valve   sclerosis with trivial AI, moderate biatrial enlargement, mild   RVE with low normal RV systolic  function, mild TR, RVSP 35 mmHg,   normal IVC, trivial posterior pericardial effusion.  Antimicrobials:    Interval history/Subjective: S/p bone marrow biopsy. Feels fine, no complaints.  Objective: Vitals:  Vitals:   12/05/16 1130 12/05/16 1347  BP: 104/71 98/60  Pulse: 84 82  Resp: 17 (!) 21  Temp:  97.9 F (36.6 C)  SpO2: 93% 100%    Exam:  Constitutional:   . Appears calm and comfortable lying in bed Respiratory:  . CTA bilaterally, no w/r/r.  . Respiratory effort normal.  Cardiovascular:  . irregular, no m/r/g. Telemetry afib. . No LE extremity edema   Psychiatric:  . Mental status o Mood, affect appropriate  I have personally reviewed the following:   Labs:  Hgb 9.5 >> 9.0  T bili 1.9 >> 1.4  Imaging studies:    Scheduled Meds: . allopurinol  300 mg Oral Daily  . amiodarone  400 mg Oral BID  . aspirin  81 mg Oral Daily  . diltiazem  240 mg Oral Daily  . fentaNYL      . ferrous sulfate  325 mg Oral BID WC  . lidocaine      . loratadine  10 mg Oral Daily  . metoprolol tartrate  25 mg Oral BID  . midazolam      . pantoprazole  40 mg Oral Daily  . polyethylene glycol  17 g Oral BID  . predniSONE  50 mg Oral Q breakfast  . riTUXimab (RITUXAN) IV infusion  375 mg/m2 Intravenous Once  . rosuvastatin  20 mg Oral q1800   Continuous Infusions: . sodium chloride Stopped (12/03/16 0000)  . sodium chloride 10 mL/hr (12/05/16 0925)  Principal Problem:   Atrial fibrillation with RVR (HCC) Active Problems:   Mild intellectual disability   Essential hypertension   Constipation   Hyperlipidemia   Normocytic anemia   Lymphadenopathy   Mantle cell lymphoma ([place])   LOS: 8 days

## 2016-12-05 NOTE — Progress Notes (Signed)
Progress Note  Patient Name: [name] [name] Date of Encounter: 12/05/2016  Primary Cardiologist: Dr. Percival Spanish  Subjective   "I'm just trying to keep my mind strong." He denies chest pain, palpitations, and dizziness.   Inpatient Medications    Scheduled Meds: . allopurinol  300 mg Oral Daily  . amiodarone  400 mg Oral BID  . aspirin  81 mg Oral Daily  . diltiazem  240 mg Oral Daily  . ferrous sulfate  325 mg Oral BID WC  . loratadine  10 mg Oral Daily  . metoprolol tartrate  12.5 mg Oral BID  . pantoprazole  40 mg Oral Daily  . polyethylene glycol  17 g Oral BID  . predniSONE  50 mg Oral Q breakfast  . riTUXimab (RITUXAN) IV infusion  375 mg/m2 Intravenous Once  . rosuvastatin  20 mg Oral q1800   Continuous Infusions: . sodium chloride Stopped (12/03/16 0000)   PRN Meds: acetaminophen, heparin lock flush, heparin lock flush, ondansetron (ZOFRAN) IV, sodium chloride flush, sodium chloride flush   Vital Signs    Vitals:   12/05/16 0100 12/05/16 0200 12/05/16 0300 12/05/16 0549  BP:    115/86  Pulse: 64 62 71 97  Resp: 15 14 17 17   Temp:    97.6 F (36.4 C)  TempSrc:    Oral  SpO2: 99% 95% (!) 87% 96%  Weight:    198 lb 9.6 oz (90.1 kg)  Height:        Intake/Output Summary (Last 24 hours) at 12/05/2016 0756 Last data filed at 12/05/2016 0551 Gross per 24 hour  Intake 718 ml  Output 800 ml  Net -82 ml   Filed Weights   12/03/16 1704 12/04/16 0322 12/05/16 0549  Weight: 195 lb 6.4 oz (88.6 kg) 195 lb 9.6 oz (88.7 kg) 198 lb 9.6 oz (90.1 kg)     Physical Exam   General: Well developed, well nourished, male appearing in no acute distress. Head: Normocephalic, atraumatic.  Neck: Supple without bruits, no JVD but difficult to assess Lungs:  Resp regular and unlabored, occasional expiratory wheeze Heart: irregular rhythm, regular rate, no murmurs Abdomen: Soft, non-tender, non-distended with normoactive bowel sounds. No hepatomegaly. No rebound/guarding.  No obvious abdominal masses. Extremities: No clubbing, cyanosis, no edema. Distal pedal pulses are 1+ bilaterally. Neuro: Alert and oriented X 3. Moves all extremities spontaneously. Psych: Normal affect.  Labs    Chemistry Recent Labs  Lab 11/30/16 1347 12/01/16 0426 12/04/16 0313 12/05/16 0346  NA 133* 136 135 136  K 3.5 3.3* 4.4 4.6  CL 100* 103 104 103  CO2 23 25 24 27   GLUCOSE 132* 108* 117* 117*  BUN 12 11 12 17   CREATININE 0.72 0.70 0.73 0.72  CALCIUM 8.0* 7.8* 8.3* 8.4*  PROT 5.4*  --  5.4* 5.4*  ALBUMIN 2.1*  --  2.0* 2.0*  AST 31  --  30 33  ALT 10*  --  12* 14*  ALKPHOS 69  --  74 63  BILITOT 1.5*  --  1.9* 1.4*  GFRNONAA >60 >60 >60 >60  GFRAA >60 >60 >60 >60  ANIONGAP 10 8 7 6      Hematology Recent Labs  Lab 12/03/16 0357 12/03/16 2205 12/04/16 0313 12/05/16 0346  WBC 4.3  --  5.0 4.5  RBC 2.64*  2.64*  --  3.45* 3.20*  HGB 7.4* 9.2* 9.5* 9.0*  HCT 23.6* 28.8* 30.2* 28.4*  MCV 89.4  --  87.5 88.8  MCH 28.0  --  27.5 28.1  MCHC 31.4  --  31.5 31.7  RDW 17.4*  --  17.0* 17.2*  PLT 164  --  172 193    Cardiac Enzymes Recent Labs  Lab 11/30/16 2122 12/01/16 0426 12/01/16 0813  TROPONINI <0.03 <0.03 <0.03   No results for input(s): TROPIPOC in the last 168 hours.   BNPNo results for input(s): BNP, PROBNP in the last 168 hours.   DDimer No results for input(s): DDIMER in the last 168 hours.   Radiology    No results found.   Telemetry    Afib in the 90-100s - Personally Reviewed  ECG    No new tracings - Personally Reviewed   Cardiac Studies   Echo 11/29/16: Study Conclusions - Left ventricle: Wall thickness was increased in a pattern of moderate LVH. Systolic function was normal. The estimated ejection fraction was in the range of 55% to 60%. Wall motion was normal; there were no regional wall motion abnormalities. The study is not technically sufficient to allow evaluation of LV diastolic function. - Aortic  valve: Trileaflet. Sclerosis without stenosis. There was trivial regurgitation. - Mitral valve: Mildly thickened leaflets . There was trivial regurgitation. - Left atrium: Moderately dilated. - Right ventricle: The cavity size was mildly dilated. Low normal systolic function. - Right atrium: Moderately dilated. - Tricuspid valve: There was mild regurgitation. - Pulmonary arteries: PA peak pressure: 35 mm Hg (S). - Inferior vena cava: The vessel was normal in size. The respirophasic diameter changes were in the normal range (>= 50%), consistent with normal central venous pressure. - Pericardium, extracardiac: A trivial pericardial effusion was identified posterior to the heart. Features were not consistent with tamponade physiology.  Impressions: - LVEF 55-60%, moderate LVH, normal wall motion, aortic valve sclerosis with trivial AI, moderate biatrial enlargement, mild RVE with low normal RV systolic function, mild TR, RVSP 35 mmHg, normal IVC, trivial posterior pericardial effusion.    Patient Profile     66 y.o. male with prior stroke, hypertension, hyperlipidemia, and anemia who was undergoing workup for his anemia with a biopsy when he was found to go into atrial fibrillation with rapid ventricular response.  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 Atrial fibrillation, tachy-brady syndrome - on 240 mg diltiazem and lopressor 12.5 mg bid - started on amiodarone 400 mg BID yesterday - AC held for anemia - telemetry with Afib, has been generally rate-controlled since yesterday at 1000 in the 90-100s - tolerating the rhythm well - If pressure tolerates, may increase lopressor to 25 mg BID   2. HTN - pressure well-controlled on current regimen   3. Anemia - Hb stable at 9.0   Signed, Ledora Bottcher ,  [place] 7:56 AM 12/05/2016 Pager: [phone]

## 2016-12-05 NOTE — Procedures (Signed)
CT guided bone marrow biopsy of right ilium.  2 aspirates and 1 core.  Minimal blood loss and no immediate complication.

## 2016-12-05 NOTE — Progress Notes (Signed)
[name] [name] tolerated the bone marrow biopsy today w/o event. Anticipate results 11/27 or so.  [name] [name] is still pretty-much bed-bound here but feels [name] [name] could walk with assistance.  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 [name] [name] is currently receiving prednisone with good tolerance. This ends 11/25. [name] [name] reports no side effects from the steroids.  While there is no standard treatment for this type of lymphoma, for non-transplant candidates the combination of rituximab and bendamustine is frequently used and is effective, though not expected to be curative. The plan is to try to get the patient into remission then start maintenance therapy.  We discussed the possible side-effects, toxicities and complications of these agents. [name] [name] is aware of some of these as [name] [name] has had several relatives who received chemotherapy (for rectal and other cancers).  If [name] [name] is still in the hospital 11/26 [name] [name] will receive rituximab that day. If [name] [name] is discharged over the weekend I have scheduled him for bendamustine in our office Thursday 11/29. We will contact the nursing home to arrange for transportation.  Again, thank you for your help to this patient!

## 2016-12-05 NOTE — Progress Notes (Signed)
CSW following for discharge back to long term SNF. CSW updated [place] on patient's condition today. CSW to follow and support.  Estanislado Emms, [place]

## 2016-12-06 DIAGNOSIS — C8310 Mantle cell lymphoma, unspecified site: Secondary | ICD-10-CM | POA: Diagnosis not present

## 2016-12-06 DIAGNOSIS — R262 Difficulty in walking, not elsewhere classified: Secondary | ICD-10-CM | POA: Diagnosis not present

## 2016-12-06 DIAGNOSIS — D649 Anemia, unspecified: Secondary | ICD-10-CM | POA: Diagnosis not present

## 2016-12-06 DIAGNOSIS — A419 Sepsis, unspecified organism: Secondary | ICD-10-CM | POA: Diagnosis not present

## 2016-12-06 DIAGNOSIS — Z8249 Family history of ischemic heart disease and other diseases of the circulatory system: Secondary | ICD-10-CM | POA: Diagnosis not present

## 2016-12-06 DIAGNOSIS — Z79899 Other long term (current) drug therapy: Secondary | ICD-10-CM | POA: Diagnosis not present

## 2016-12-06 DIAGNOSIS — R002 Palpitations: Secondary | ICD-10-CM | POA: Diagnosis present

## 2016-12-06 DIAGNOSIS — R251 Tremor, unspecified: Secondary | ICD-10-CM | POA: Diagnosis present

## 2016-12-06 DIAGNOSIS — D630 Anemia in neoplastic disease: Secondary | ICD-10-CM | POA: Diagnosis not present

## 2016-12-06 DIAGNOSIS — R41841 Cognitive communication deficit: Secondary | ICD-10-CM | POA: Diagnosis not present

## 2016-12-06 DIAGNOSIS — Z87891 Personal history of nicotine dependence: Secondary | ICD-10-CM | POA: Diagnosis not present

## 2016-12-06 DIAGNOSIS — T451X5A Adverse effect of antineoplastic and immunosuppressive drugs, initial encounter: Secondary | ICD-10-CM | POA: Diagnosis not present

## 2016-12-06 DIAGNOSIS — D179 Benign lipomatous neoplasm, unspecified: Secondary | ICD-10-CM | POA: Diagnosis present

## 2016-12-06 DIAGNOSIS — I4891 Unspecified atrial fibrillation: Secondary | ICD-10-CM | POA: Diagnosis not present

## 2016-12-06 DIAGNOSIS — Y92538 Other ambulatory health services establishments as the place of occurrence of the external cause: Secondary | ICD-10-CM | POA: Diagnosis present

## 2016-12-06 DIAGNOSIS — I499 Cardiac arrhythmia, unspecified: Secondary | ICD-10-CM | POA: Diagnosis not present

## 2016-12-06 DIAGNOSIS — Z22322 Carrier or suspected carrier of Methicillin resistant Staphylococcus aureus: Secondary | ICD-10-CM | POA: Diagnosis not present

## 2016-12-06 DIAGNOSIS — R0602 Shortness of breath: Secondary | ICD-10-CM | POA: Diagnosis present

## 2016-12-06 DIAGNOSIS — I480 Paroxysmal atrial fibrillation: Secondary | ICD-10-CM | POA: Diagnosis not present

## 2016-12-06 DIAGNOSIS — E785 Hyperlipidemia, unspecified: Secondary | ICD-10-CM | POA: Diagnosis not present

## 2016-12-06 DIAGNOSIS — R17 Unspecified jaundice: Secondary | ICD-10-CM | POA: Diagnosis present

## 2016-12-06 DIAGNOSIS — R221 Localized swelling, mass and lump, neck: Secondary | ICD-10-CM | POA: Diagnosis not present

## 2016-12-06 DIAGNOSIS — Z7982 Long term (current) use of aspirin: Secondary | ICD-10-CM | POA: Diagnosis not present

## 2016-12-06 DIAGNOSIS — Z5112 Encounter for antineoplastic immunotherapy: Secondary | ICD-10-CM | POA: Diagnosis not present

## 2016-12-06 DIAGNOSIS — R590 Localized enlarged lymph nodes: Secondary | ICD-10-CM | POA: Diagnosis present

## 2016-12-06 DIAGNOSIS — M6281 Muscle weakness (generalized): Secondary | ICD-10-CM | POA: Diagnosis not present

## 2016-12-06 DIAGNOSIS — F70 Mild intellectual disabilities: Secondary | ICD-10-CM | POA: Diagnosis not present

## 2016-12-06 DIAGNOSIS — I10 Essential (primary) hypertension: Secondary | ICD-10-CM | POA: Diagnosis not present

## 2016-12-06 DIAGNOSIS — Z8673 Personal history of transient ischemic attack (TIA), and cerebral infarction without residual deficits: Secondary | ICD-10-CM | POA: Diagnosis not present

## 2016-12-06 DIAGNOSIS — C8311 Mantle cell lymphoma, lymph nodes of head, face, and neck: Secondary | ICD-10-CM | POA: Diagnosis not present

## 2016-12-06 DIAGNOSIS — N5089 Other specified disorders of the male genital organs: Secondary | ICD-10-CM | POA: Diagnosis not present

## 2016-12-06 DIAGNOSIS — Z9221 Personal history of antineoplastic chemotherapy: Secondary | ICD-10-CM | POA: Diagnosis not present

## 2016-12-06 DIAGNOSIS — D696 Thrombocytopenia, unspecified: Secondary | ICD-10-CM | POA: Diagnosis not present

## 2016-12-06 DIAGNOSIS — R599 Enlarged lymph nodes, unspecified: Secondary | ICD-10-CM | POA: Diagnosis not present

## 2016-12-06 LAB — CBC
HCT: 29.8 % — ABNORMAL LOW (ref 39.0–52.0)
Hemoglobin: 9.3 g/dL — ABNORMAL LOW (ref 13.0–17.0)
MCH: 27.8 pg (ref 26.0–34.0)
MCHC: 31.2 g/dL (ref 30.0–36.0)
MCV: 89.2 fL (ref 78.0–100.0)
Platelets: 195 10*3/uL (ref 150–400)
RBC: 3.34 MIL/uL — AB (ref 4.22–5.81)
RDW: 17.2 % — ABNORMAL HIGH (ref 11.5–15.5)
WBC: 3.8 10*3/uL — AB (ref 4.0–10.5)

## 2016-12-06 LAB — LACTATE DEHYDROGENASE: LDH: 208 U/L — ABNORMAL HIGH (ref 98–192)

## 2016-12-06 LAB — COMPREHENSIVE METABOLIC PANEL
ALT: 17 U/L (ref 17–63)
AST: 37 U/L (ref 15–41)
Albumin: 2 g/dL — ABNORMAL LOW (ref 3.5–5.0)
Alkaline Phosphatase: 78 U/L (ref 38–126)
Anion gap: 9 (ref 5–15)
BUN: 16 mg/dL (ref 6–20)
CHLORIDE: 103 mmol/L (ref 101–111)
CO2: 22 mmol/L (ref 22–32)
CREATININE: 0.66 mg/dL (ref 0.61–1.24)
Calcium: 8.3 mg/dL — ABNORMAL LOW (ref 8.9–10.3)
GFR calc non Af Amer: >60 mL/min (ref >60)
Glucose, Bld: 118 mg/dL — ABNORMAL HIGH (ref 65–99)
Potassium: 5 mmol/L (ref 3.5–5.1)
SODIUM: 134 mmol/L — AB (ref 135–145)
Total Bilirubin: 1.3 mg/dL — ABNORMAL HIGH (ref 0.3–1.2)
Total Protein: 5.5 g/dL — ABNORMAL LOW (ref 6.5–8.1)

## 2016-12-06 LAB — PHOSPHORUS: Phosphorus: 4.5 mg/dL (ref 2.5–4.6)

## 2016-12-06 LAB — URIC ACID: Uric Acid, Serum: 1.7 mg/dL — ABNORMAL LOW (ref 4.4–7.6)

## 2016-12-06 MED ORDER — METOPROLOL TARTRATE 50 MG PO TABS
50.0000 mg | ORAL_TABLET | Freq: Two times a day (BID) | ORAL | Status: DC
  Administered 2016-12-06: 50 mg via ORAL
  Filled 2016-12-06: qty 1

## 2016-12-06 MED ORDER — PREDNISONE 50 MG PO TABS: 50.0000 mg | ORAL_TABLET | Freq: Every day | ORAL | Status: AC

## 2016-12-06 MED ORDER — ASPIRIN 81 MG PO CHEW: 81.0000 mg | CHEWABLE_TABLET | Freq: Every day | ORAL | Status: AC

## 2016-12-06 MED ORDER — DILTIAZEM HCL ER COATED BEADS 240 MG PO CP24: 240.0000 mg | ORAL_CAPSULE | Freq: Every day | ORAL | Status: AC

## 2016-12-06 MED ORDER — ALLOPURINOL 300 MG PO TABS: 300.0000 mg | ORAL_TABLET | Freq: Every day | ORAL | Status: AC

## 2016-12-06 MED ORDER — METOPROLOL TARTRATE 50 MG PO TABS: 50.0000 mg | ORAL_TABLET | Freq: Two times a day (BID) | ORAL | Status: AC

## 2016-12-06 MED ORDER — AMIODARONE HCL 200 MG PO TABS: ORAL_TABLET | ORAL | Status: AC

## 2016-12-06 NOTE — Progress Notes (Signed)
[facility] called and attempted  to give report on [name] [name],Cipriano. No one picked up the phone to get  Report.

## 2016-12-06 NOTE — Progress Notes (Signed)
Progress Note  Patient Name: DEL OVERFELT Date of Encounter: 12/06/2016  Primary Cardiologist: Oval Linsey   Subjective   No complaints no chest pain palpitations or dyspnea   Inpatient Medications    Scheduled Meds: . allopurinol  300 mg Oral Daily  . amiodarone  400 mg Oral BID  . aspirin  81 mg Oral Daily  . diltiazem  240 mg Oral Daily  . ferrous sulfate  325 mg Oral BID WC  . loratadine  10 mg Oral Daily  . metoprolol tartrate  25 mg Oral BID  . pantoprazole  40 mg Oral Daily  . polyethylene glycol  17 g Oral BID  . predniSONE  50 mg Oral Q breakfast  . riTUXimab (RITUXAN) IV infusion  375 mg/m2 Intravenous Once  . rosuvastatin  20 mg Oral q1800   Continuous Infusions: . sodium chloride Stopped (12/03/16 0000)  . sodium chloride 10 mL/hr (12/05/16 0925)   PRN Meds: sodium chloride, acetaminophen, heparin lock flush, heparin lock flush, ondansetron (ZOFRAN) IV, sodium chloride flush, sodium chloride flush   Vital Signs    Vitals:   12/05/16 1347 12/05/16 1727 12/05/16 2100 12/06/16 0424  BP: 98/60 117/85 105/67 106/85  Pulse: 82 94 86 80  Resp: (!) 21 17 (!) 26 19  Temp: 97.9 F (36.6 C)  98 F (36.7 C) (!) 97.4 F (36.3 C)  TempSrc: Oral  Oral Oral  SpO2: 100% 93% 95% 100%  Weight:    196 lb 12.8 oz (89.3 kg)  Height:        Intake/Output Summary (Last 24 hours) at 12/06/2016 0928 Last data filed at 12/06/2016 0500 Gross per 24 hour  Intake 1145.33 ml  Output 1850 ml  Net -704.67 ml   Filed Weights   12/04/16 0322 12/05/16 0549 12/06/16 0424  Weight: 195 lb 9.6 oz (88.7 kg) 198 lb 9.6 oz (90.1 kg) 196 lb 12.8 oz (89.3 kg)    Telemetry    afib rates 80-95 - Personally Reviewed  ECG    afib nonspecific ST/T wave changes  - Personally Reviewed  Physical Exam  Poor vision  GEN: No acute distress.   Neck: No JVD adenopathy  Cardiac: RRR, no murmurs, rubs, or gallops.  Respiratory: Clear to auscultation bilaterally. GI: Soft, nontender,  non-distended  MS: No edema; No deformity. Neuro:  Nonfocal  Psych: Normal affect   Labs    Chemistry Recent Labs  Lab 12/04/16 0313 12/05/16 0346 12/06/16 0432  NA 135 136 134*  K 4.4 4.6 5.0  CL 104 103 103  CO2 _0 GLUCOSE 117* 117* 118*  BUN _1 CREATININE 0.73 0.72 0.66  CALCIUM 8.3* 8.4* 8.3*  PROT 5.4* 5.4* 5.5*  ALBUMIN 2.0* 2.0* 2.0*  AST 30 33 37  ALT 12* 14* 17  ALKPHOS 74 63 78  BILITOT 1.9* 1.4* 1.3*  GFRNONAA >60 >60 >60  GFRAA >60 >60 >60  ANIONGAP _2 Hematology Recent Labs  Lab 12/04/16 0313 12/05/16 0346 12/06/16 0432  WBC 5.0 4.5 3.8*  RBC 3.45* 3.20* 3.34*  HGB 9.5* 9.0* 9.3*  HCT 30.2* 28.4* 29.8*  MCV 87.5 88.8 89.2  MCH 27.5 28.1 27.8  MCHC 31.5 31.7 31.2  RDW 17.0* 17.2* 17.2*  PLT 172 193 195    Cardiac Enzymes Recent Labs  Lab 11/30/16 2122 12/01/16 0426 12/01/16 0813  TROPONINI <0.03 <0.03 <0.03   No results for input(s): TROPIPOC in the last 168 hours.  BNPNo results for input(s): BNP, PROBNP in the last 168 hours.   DDimer No results for input(s): DDIMER in the last 168 hours.   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 EXAM: CT GUIDED BONE MARROW ASPIRATES AND BIOPSY Physician: Stephan Minister. Anselm Pancoast, MD MEDICATIONS: None. ANESTHESIA/SEDATION: Fentanyl 50 mcg IV; Versed 1.0 mg IV Moderate Sedation Time:  12 minutes The patient was continuously monitored during the procedure by the interventional radiology nurse under my direct supervision. COMPLICATIONS: None immediate. PROCEDURE: The procedure was explained to the patient. The risks and benefits of the procedure were discussed and the patient's questions were addressed. Informed consent was obtained from the patient. The patient was placed prone on CT scan. Images of the pelvis were obtained. The right side of back was prepped and draped in sterile fashion. The skin and right posterior iliac  bone were anesthetized with 1% lidocaine. 11 gauge bone needle was directed into the right iliac bone with CT guidance. Two aspirates and one core biopsy obtained. Bandage placed over the puncture site. FINDINGS: Bone needle directed in the right ilium. Extensive lymphadenopathy throughout the pelvis and inguinal regions. IMPRESSION: CT guided bone marrow aspirates and core biopsy. Electronically Signed   By: Markus Daft M.D.   On: 12/05/2016 10:19    Cardiac Studies   Echo EF 55-60% moderate LAE  Patient Profile     66 y.o. male with new diagnosis of lymphoma. Presented with neck mass Noted to Have PAF Not a candidate for anticoagulation Primary goal is rate control   Assessment & Plan    PAF:  Will try to increase metoprolol to 50 bid Rhythm is asymptomatic Echo alright Ok to d/c home from our standpoint will arrange outpatient f/u with Dr Oval Linsey  Will sign off   Jenkins Rouge

## 2016-12-06 NOTE — Progress Notes (Signed)
Patient will Discharge To: [facility] Anticipated DC Date:12/06/16 Family Notified: attempted to contact [employer] By: Corey Harold   Per MD patient ready for DC to [facility] . RN, patient, patient's family (attempted to contact friend Hulan Amato), and facility notified of DC. Assessment, Fl2/Pasrr, and Discharge Summary sent to facility. RN given number for report ( [phone]). DC packet on chart. Ambulance transport requested for patient.   CSW signing off.  Reed Breech LCSWA [phone]

## 2016-12-06 NOTE — Discharge Summary (Signed)
Physician Discharge Summary  [name] [name] WUJ:[id number] DOB: 1950-10-06 DOA: 11/27/2016  PCP: Wenda Low, MD  Admit date: 11/27/2016 Discharge date: 12/06/2016  Recommendations for Outpatient Follow-up:  1. Mantle cell NHL. Oncology will arrange followup for 11/29 administration of bendamustine. 2. Follow-up bone marrow biopsy 3. Afib/RVR. Rate-control achieved with new meds below. Eventual consideration of anticoagulation will be deferred to outpatient setting  4. Normocytic anemia    Contact information for follow-up providers    Magrinat, [name], MD Follow up on 12/11/2016.   Specialty:  Oncology Why:  office will coordinate with NH to arrange transportation Contact information: [place] [address] [phone]            Contact information for after-discharge care    [facility] Follow up.   Service:  Skilled Nursing Contact information: [place] [phone]                   Discharge Diagnoses:  1. Afib/RVR. 2. Normocytic anemia 3. Mantle cell NHL.  Discharge Condition: improved Disposition: return to SNF  Diet recommendation: heart healthy  Filed Weights   12/04/16 0322 12/05/16 0549 12/06/16 0424  Weight: 88.7 kg (195 lb 9.6 oz) 90.1 kg (198 lb 9.6 oz) 89.3 kg (196 lb 12.8 oz)    History of present illness:  66yom presented to IR as outpt for elective biopsy of neck mass, found to have afib with RVR and sent to ED. Admitted for [place].  Hospital Course:  Seen by cardiology. Rate-control of afib was difficult to achieve and required multiple medication adjustments. Patient was stabilized and seen by oncology and successfully underwent bone marrow biopsy 11/23. Oncology started 5 days of prednisone and plans initiation of outpatient chemotherapy next week. Anemia was noted and treated as below. Individual issues as below.  Afib/RVR. TSH  WNL. CHA2DS2-VASc 6. Echo showed normal LVEF. - HR controlled after multiple adjustments. Cardiology has signed off.  Will continue amiodarone, diltiazem, metoprolol on discharge. - eventual consideration of anticoagulation will be deferred to outpatient setting   Normocytic anemia, reported hematuria, hematochezia, oral bleeding, though no evidence of this as inpatient.  - Hgb declined and was evaluated by oncology. No evidence of hemolysis. Hgb improved with transfusion PRBC 11/21. Oncology suspected bleeding as etiology and consulted GI as this lymphoma can involve the GI tract. GI saw pt and pt refused endoscopy. - Hgb has been stable the last 72 hours. Follow-up next week with oncology.  - urine is clear today and u/a was negative yesterday  Mantle cell NHL. - Rx per oncology; currently steroids 5 day course stop after 11/25, continue allopurinol. Rituximab deferred to 11/29. Bone marrow biopsy done 11/23   Consultants:  Cardiology  IR  Oncology   GI  Procedures:  11/16 Technically successful US guided biopsy ofindeterminate right cervical lymph node.  11/21 transfusion 2 units PRBC  Echo Impressions:  - LVEF 55-60%, moderate LVH, normal wall motion, aortic valve sclerosis with trivial AI, moderate biatrial enlargement, mild RVE with low normal RV systolic function, mild TR, RVSP 35 mmHg, normal IVC, trivial posterior pericardial effusion.    Today's assessment: S: feels fine. Eating ok. O: Vitals:  Vitals:   12/06/16 0424 12/06/16 0946  BP: 106/85 99/68  Pulse: 80   Resp: 19   Temp: (!) 97.4 F (36.3 C)   SpO2: 100%     Constitutional: . Appears calm and comfortable Respiratory:  .  CTA bilaterally, no w/r/r.  . Respiratory effort normal. Cardiovascular:  . irregular, no m/r/g. Telemetry afib 80s. . 1+ BLE extremity edema   Psychiatric:  . judgement and insight appear normal . Mental status o Mood, affect appropriate  CMP noted. Uric  acid and tbili w/o change. Hgb stable 9.3  Discharge Instructions  Discharge Instructions    Type and screen   Complete by:  Dec 03, 2016    [place] order/instruction   Complete by:  As directed    Transfuse Parameters   Complete patient signature process for consent form   Complete by:  As directed    Practitioner attestation of consent   Complete by:  As directed    I, the ordering practitioner, attest that I have discussed with the patient the benefits, risks, side effects, alternatives, likelihood of achieving goals and potential problems during recovery for the procedure listed.   Procedure:  Blood Product(s)     Allergies as of 12/06/2016   No Known Allergies     Medication List    STOP taking these medications   ibuprofen 400 MG tablet Commonly known as:  ADVIL,MOTRIN   lisinopril 10 MG tablet Commonly known as:  PRINIVIL,ZESTRIL     TAKE these medications   acetaminophen 650 MG CR tablet Commonly known as:  TYLENOL Take 650 mg every 8 (eight) hours as needed by mouth (for pain or fever ("for 48 hours")).   allopurinol 300 MG tablet Commonly known as:  ZYLOPRIM Take 1 tablet (300 mg total) by mouth daily. Start taking on:  12/07/2016   amiodarone 200 MG tablet Commonly known as:  PACERONE Take 2 tablets (400 mg total) by mouth 2 (two) times daily for 4 days, THEN 1 tablet (200 mg total) daily. Start taking on:  12/06/2016   aspirin 81 MG chewable tablet Chew 1 tablet (81 mg total) by mouth daily. Start taking on:  12/07/2016   diltiazem 240 MG 24 hr capsule Commonly known as:  CARDIZEM CD Take 1 capsule (240 mg total) by mouth daily. Start taking on:  12/07/2016   docusate sodium 100 MG capsule Commonly known as:  COLACE Take 100 mg by mouth 2 (two) times daily. For constipation   ferrous sulfate 325 (65 FE) MG tablet Take 325 mg 2 (two) times daily with a meal by mouth.   hydrochlorothiazide 25 MG tablet Commonly  known as:  HYDRODIURIL Take 25 mg by mouth daily.   loratadine 10 MG tablet Commonly known as:  CLARITIN Take 10 mg daily by mouth.   metoprolol tartrate 50 MG tablet Commonly known as:  LOPRESSOR Take 1 tablet (50 mg total) by mouth 2 (two) times daily.   omeprazole 20 MG capsule Commonly known as:  PRILOSEC Take 1 capsule by mouth daily.   predniSONE 50 MG tablet Commonly known as:  DELTASONE Take 1 tablet (50 mg total) by mouth daily with breakfast for 1 day. Start taking on:  12/07/2016   simvastatin 40 MG tablet Commonly known as:  ZOCOR Take 40 mg by mouth at bedtime.   Vitamin D-3 1000 units Caps Take 2,000 Units daily by mouth.      No Known Allergies  The results of significant diagnostics from this hospitalization (including imaging, microbiology, ancillary and laboratory) are listed below for reference.    Significant Diagnostic Studies: Dg Chest 2 View  Result Date: 11/30/2016 CLINICAL DATA:  Fever today. EXAM: CHEST  2 VIEW COMPARISON:  CT  chest 11/27/2016.  Chest radiograph 11/27/2016. FINDINGS: Mild cardiac enlargement. No vascular congestion. No airspace disease or consolidation in the lungs. No blunting of costophrenic angles. No pneumothorax. Calcification of the aorta. Prominent lymphadenopathy demonstrated at CT is not well visualized radiographically. IMPRESSION: Cardiac enlargement.  No evidence of active pulmonary disease. Electronically Signed   By: Lucienne Capers M.D.   On: 11/30/2016 23:36   Ct Soft Tissue Neck Wo Contrast  Result Date: 11/21/2016 CLINICAL DATA:  Initial evaluation for right-sided neck swelling. EXAM: CT NECK WITHOUT CONTRAST TECHNIQUE: Multidetector CT imaging of the neck was performed following the standard protocol without intravenous contrast. COMPARISON:  None available. FINDINGS: Pharynx and larynx: Oral cavity within normal limits without mass lesion or loculated fluid collection. Patient is edentulous. Palatine tonsils  symmetric and within normal limits bilaterally. Few small calcified tonsilliths noted. Parapharyngeal fat preserved. Nasopharynx within normal limits. Retropharyngeal soft tissues demonstrate no acute abnormality. Epiglottis normal. Vallecula clear. Small amount of layering secretions present within the right hypopharynx, extending into the right piriform sinus. Remainder of the hypopharynx and supraglottic larynx grossly normal. True cords symmetric and within normal limits. Subglottic airway clear. Salivary glands: Salivary glands including the parotid and submandibular glands are within normal limits bilaterally. Thyroid: Thyroid within normal limits. Lymph nodes: Extensive bulky adenopathy seen throughout the neck and visualized upper chest, slightly worse on the right as compared to the left. Nodes are seen at essentially all levels, most prevalent within the right supraclavicular and axillary region. For reference purposes, largest discrete node within the right supraclavicular region measures approximately 2.6 x 5.1 cm (series 2, image 85). Largest discrete nodal mass within the right axillary region measures approximately 9.4 x 3.7 cm (series 2, image 114). In the left neck, largest discrete node seen at left level III and measures 1.8 x 2.7 cm (series 2, image 71). Largest node within the left upper chest seen within the left subpectoral region and measures 5.4 x 2.9 cm (series 2, image 106). Study nodes seen within the partially visualized upper mediastinum as well, largest of which measures 1 cm in the prevascular region. Findings highly concerning for lymphoproliferative disorder/ lymphoma. Vascular: Atherosclerotic changes noted about the aortic arch and carotid bifurcations. Limited intracranial: Unremarkable. Visualized orbits: Partially visualized globes and orbital soft tissues within normal limits. Mastoids and visualized paranasal sinuses: Chronic maxillary sinusitis noted, right worse than left.  Retained metallic density at the posterior right maxillary sinus. Sequelae of prior ORIF at the anterior maxillary sinuses/alveolar ridge. Visualize mastoids and middle ear cavities are clear. Skeleton: No acute osseus abnormality. No worrisome lytic or blastic osseous lesions. Straightening of the normal cervical lordosis with trace retrolisthesis of C3 on C4. Moderate to advanced degenerate spondylolysis at C5-6. Upper chest: Partially visualized lungs are clear. Other: None. IMPRESSION: 1. Extensive bulky adenopathy throughout the neck and visualized upper chest, right worse than left, highly concerning for lymphoproliferative disorder/lymphoma. 2. No other acute abnormality within the neck. 3. Chronic maxillary sinusitis. Electronically Signed   By: Jeannine Boga M.D.   On: 11/21/2016 15:27   Ct Angio Chest Pe W And/or Wo Contrast  Result Date: 11/27/2016 CLINICAL DATA:  Tachycardia. Suspect pulmonary embolism. History of hypertension, ex smoker, bladder tumor. Scheduled for neck biopsy today. EXAM: CT ANGIOGRAPHY CHEST WITH CONTRAST TECHNIQUE: Multidetector CT imaging of the chest was performed using the standard protocol during bolus administration of intravenous contrast. Multiplanar CT image reconstructions and MIPs were obtained to evaluate the vascular anatomy. CONTRAST:  144m ISOVUE-370  IOPAMIDOL (ISOVUE-370) INJECTION 76% COMPARISON:  CT neck November 21, 2016 and chest radiograph November 27, 2016 and CT abdomen and pelvis June 22, 2012 FINDINGS: CARDIOVASCULAR: Adequate contrast opacification of the pulmonary artery's. Main pulmonary artery is not enlarged. No pulmonary arterial filling defects to the level of the segmental branches, distal branches obscured by respiratory motion. Heart is upper limits of normal in size. Mild coronary artery calcifications. Small pericardial effusion. Thoracic aorta is normal course and caliber, unremarkable. MEDIASTINUM/NODES: Greater than expected number  of mildly enlarged mediastinal and hilar lymph nodes. Anterior mediastinal fat stranding. Bulky RIGHT greater than LEFT axillary lymphadenopathy, 12.9 x 6.7 cm RIGHT axillary nodal conglomeration. Bulky RIGHT grand LEFT supraclavicular lymphadenopathy. LUNGS/PLEURA: Tracheobronchial tree is patent, no pneumothorax. No pleural effusions, focal consolidations, pulmonary nodules or masses. UPPER ABDOMEN: New splenomegaly. 6.7 cm cyst (5 Hounsfield units) LEFT renal fossa, larger than prior CT. Stable 3 cm cyst (3 Hounsfield units) RIGHT lobe of the liver. Partially imaged probable portal caval lymphadenopathy. MUSCULOSKELETAL: Nonacute. RIGHT biceps intramuscular lipoma versus atrophy. Multilevel scattered Schmorl's nodes. Review of the MIP images confirms the above findings. IMPRESSION: 1. No acute pulmonary embolism. 2. Bulky supraclavicular, axillary and possibly portal caval lymphadenopathy. 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 3. Borderline cardiomegaly.  No acute pulmonary process. Aortic Atherosclerosis (ICD10-I70.0). Electronically Signed   By: Elon Alas M.D.   On: 11/27/2016 17:13   Dg Chest Port 1 View  Result Date: 11/27/2016 CLINICAL DATA:  Atrial fibrillation EXAM: PORTABLE CHEST 1 VIEW COMPARISON:  None. FINDINGS: Cardiac silhouette is upper limits of normal. Mild aortic atherosclerotic calcification. No focal airspace consolidation or pulmonary edema. No pneumothorax or sizable pleural effusion. IMPRESSION: No active disease. Aortic Atherosclerosis (ICD10-I70.0). Electronically Signed   By: Ulyses Jarred M.D.   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 EXAM: CT GUIDED BONE MARROW ASPIRATES AND BIOPSY Physician: Stephan Minister. Anselm Pancoast, MD MEDICATIONS: None. ANESTHESIA/SEDATION: Fentanyl 50 mcg IV; Versed 1.0 mg IV Moderate Sedation Time:  12 minutes The patient was  continuously monitored during the procedure by the interventional radiology nurse under my direct supervision. COMPLICATIONS: None immediate. PROCEDURE: The procedure was explained to the patient. The risks and benefits of the procedure were discussed and the patient's questions were addressed. Informed consent was obtained from the patient. The patient was placed prone on CT scan. Images of the pelvis were obtained. The right side of back was prepped and draped in sterile fashion. The skin and right posterior iliac bone were anesthetized with 1% lidocaine. 11 gauge bone needle was directed into the right iliac bone with CT guidance. Two aspirates and one core biopsy obtained. Bandage placed over the puncture site. FINDINGS: Bone needle directed in the right ilium. Extensive lymphadenopathy throughout the pelvis and inguinal regions. IMPRESSION: CT guided bone marrow aspirates and core biopsy. Electronically Signed   By: Markus Daft M.D.   On: 12/05/2016 10:19   [place] Core Biopsy (lymph Nodes)  Result Date: 11/28/2016 INDICATION: Bulky cervical axillary lymphadenopathy. Please perform ultrasound-guided biopsy for tissue diagnostic purposes. EXAM: ULTRASOUND-GUIDED RIGHT CERVICAL LYMPH NODE BIOPSY COMPARISON:  Neck CT - 11/21/2016; chest CT - 11/17/2016 MEDICATIONS: None ANESTHESIA/SEDATION: Moderate (conscious) sedation was employed during this procedure. A total of Versed 2 mg and Fentanyl 75 mcg was administered intravenously. Moderate Sedation Time: 10 minutes. The patient's level of consciousness and vital signs were monitored continuously by radiology nursing throughout the procedure under  my direct supervision. COMPLICATIONS: None immediate. TECHNIQUE: Informed written consent was obtained from the patient after a discussion of the risks, benefits and alternatives to treatment. Questions regarding the procedure were encouraged and answered. Initial ultrasound scanning demonstrated multiple enlarged cervical  lymph nodes. A dominant approximately 3.2 x 1.6 cm lymph node within the superficial aspect of the mid right lateral neck was targeted for biopsy given lymph node location and sonographic window. An ultrasound image was saved for documentation purposes. The procedure was planned. A timeout was performed prior to the initiation of the procedure. The operative was prepped and draped in the usual sterile fashion, and a sterile drape was applied covering the operative field. A timeout was performed prior to the initiation of the procedure. Local anesthesia was provided with 1% lidocaine with epinephrine. Under direct ultrasound guidance, an 18 gauge core needle device was utilized to obtain to obtain 6 core needle biopsies of the dominant right cervical lymph node. The samples were placed in saline and submitted to pathology. The needle was removed and hemostasis was achieved with manual compression. Post procedure scan was negative for significant hematoma. A dressing was placed. The patient tolerated the procedure well without immediate postprocedural complication. IMPRESSION: Technically successful ultrasound guided biopsy of dominant right cervical lymph node. Electronically Signed   By: Sandi Mariscal M.D.   On: 11/28/2016 13:48   US Soft Tissue Neck  Result Date: 12/01/2016 CLINICAL DATA:  66 year old male with a history of neck mass, prior biopsy, possible infection EXAM: ULTRASOUND OF HEAD/NECK SOFT TISSUES TECHNIQUE: Ultrasound examination of the head and neck soft tissues was performed in the area of clinical concern. COMPARISON:  Ultrasound 11/28/2016, prior CT 11/27/2016 FINDINGS: Grayscale and color duplex ultrasound formed in the region of clinical concern. Multiple enlarged lymph nodes again evident, as previously demonstrated on ultrasound and CT imaging. No focal fluid collection. Post biopsy changes of targeted lymph node are evident without fluid collection, gas, or significant edema. IMPRESSION:  Ultrasound survey in the region of clinical concern demonstrates no evidence of focal fluid collection or gas. Expected changes of prior biopsy of the targeted enlarged cervical lymph nodes. Electronically Signed   By: Corrie Mckusick D.O.   On: 12/01/2016 12:16    Microbiology: Recent Results (from the past 240 hour(s))  MRSA PCR Screening     Status: Abnormal   Collection Time: 11/27/16  9:53 PM  Result Value Ref Range Status   MRSA by PCR POSITIVE (A) NEGATIVE Final    Comment:        The GeneXpert MRSA Assay (FDA approved for NASAL specimens only), is one component of a comprehensive MRSA colonization surveillance program. It is not intended to diagnose MRSA infection nor to guide or monitor treatment for MRSA infections. RESULT CALLED TO, READ BACK BY AND VERIFIED WITH: RN Madolyn Frieze [id number] MLM   Culture, blood (routine x 2)     Status: None   Collection Time: 11/29/16 12:18 AM  Result Value Ref Range Status   Specimen Description BLOOD LEFT ANTECUBITAL  Final   Special Requests IN PEDIATRIC BOTTLE Blood Culture adequate volume  Final   Culture NO GROWTH 5 DAYS  Final   Report Status 12/04/2016 FINAL  Final  Culture, blood (routine x 2)     Status: None   Collection Time: 11/29/16 12:18 AM  Result Value Ref Range Status   Specimen Description BLOOD LEFT HAND  Final   Special Requests IN PEDIATRIC BOTTLE Blood Culture adequate volume  Final   Culture NO GROWTH 5 DAYS  Final   Report Status 12/04/2016 FINAL  Final  Culture, blood (routine x 2)     Status: None   Collection Time: 11/30/16  9:25 PM  Result Value Ref Range Status   Specimen Description BLOOD LEFT HAND  Final   Special Requests   Final    BOTTLES DRAWN AEROBIC ONLY Blood Culture adequate volume   Culture NO GROWTH 5 DAYS  Final   Report Status 12/05/2016 FINAL  Final  Culture, blood (routine x 2)     Status: None   Collection Time: 11/30/16  9:30 PM  Result Value Ref Range Status   Specimen Description  BLOOD LEFT WRIST  Final   Special Requests IN PEDIATRIC BOTTLE Blood Culture adequate volume  Final   Culture NO GROWTH 5 DAYS  Final   Report Status 12/05/2016 FINAL  Final     Labs: Basic Metabolic Panel: Recent Labs  Lab 11/30/16 1347 12/01/16 0426 12/04/16 0313 12/05/16 0346 12/06/16 0432  NA 133* 136 135 136 134*  K 3.5 3.3* 4.4 4.6 5.0  CL 100* 103 104 103 103  CO2 '23 25 24 27 22  '$ GLUCOSE 132* 108* 117* 117* 118*  BUN '12 11 12 17 16  '$ CREATININE 0.72 0.70 0.73 0.72 0.66  CALCIUM 8.0* 7.8* 8.3* 8.4* 8.3*  PHOS  --   --  3.7 4.2 4.5   Liver Function Tests: Recent Labs  Lab 11/30/16 1347 12/04/16 0313 12/05/16 0346 12/06/16 0432  AST 31 30 33 37  ALT 10* 12* 14* 17  ALKPHOS 69 74 63 78  BILITOT 1.5* 1.9* 1.4* 1.3*  PROT 5.4* 5.4* 5.4* 5.5*  ALBUMIN 2.1* 2.0* 2.0* 2.0*   CBC: Recent Labs  Lab 12/02/16 0327 12/03/16 0357 12/03/16 2205 12/04/16 0313 12/05/16 0346 12/06/16 0432  WBC 4.5 4.3  --  5.0 4.5 3.8*  HGB 7.4* 7.4* 9.2* 9.5* 9.0* 9.3*  HCT 23.7* 23.6* 28.8* 30.2* 28.4* 29.8*  MCV 88.8 89.4  --  87.5 88.8 89.2  PLT 167 164  --  172 193 195   Cardiac Enzymes: Recent Labs  Lab 11/30/16 2122 12/01/16 0426 12/01/16 0813  TROPONINI <0.03 <0.03 <0.03    Principal Problem:   Atrial fibrillation with RVR (HCC) Active Problems:   Mild intellectual disability   Essential hypertension   Constipation   Hyperlipidemia   Normocytic anemia   Lymphadenopathy   Mantle cell lymphoma ([place])   Time coordinating discharge: 40 minutes  Signed:  Murray Hodgkins, MD [facility] 12/06/2016, 2:42 PM

## 2016-12-06 NOTE — Progress Notes (Signed)
Physical Therapy Evaluation Patient Details Name: [name] [name] MRN: [id number] DOB: [birth year] Today's Date: 12/06/2016   History of Present Illness   [name] [name] is a 66 y.o. male with medical history significant for HTN, HLD, CVA, CKD, and CHF presenting with afib.  He presented today to IR for a neck biopsy and they discovered that he had an irregular heart beat.  Per the IR PA's note, he was in afib with rate in the 130s.  Clinical Impression  Pt admitted with/for afib.  Unsure of pt's PLOF, but pt needs min guard to min assist for general mobility.  Pt currently limited functionally due to the problems listed. ( See problems list.)   Pt will benefit from PT to maximize function and safety in order to get ready for next venue listed below.     Follow Up Recommendations SNF    Equipment Recommendations  None recommended by PT    Recommendations for Other Services       Precautions / Restrictions Precautions Precautions: Fall Restrictions Weight Bearing Restrictions: No      Mobility  Bed Mobility Overal bed mobility: Needs Assistance Bed Mobility: Supine to Sit;Sit to Supine     Supine to sit: Min guard Sit to supine: Min guard   General bed mobility comments: no use of the rail, slow moving to EOB with trouble scooting intially, but improving after received assist  Transfers Overall transfer level: Needs assistance   Transfers: Sit to/from Stand Sit to Stand: Min assist         General transfer comment: cues for safe technique assist to come forward over BOS  Ambulation/Gait Ambulation/Gait assistance: Min assist Ambulation Distance (Feet): 4 Feet(forward and back x3) Assistive device: Rolling walker (2 wheeled) Gait Pattern/deviations: Step-through pattern;Decreased step length - right;Decreased step length - left;Decreased stride length   Gait velocity interpretation: Below normal speed for age/gender General Gait Details: generally unsteady steps  with assist for helping maneuver the RW  Stairs            Wheelchair Mobility    Modified Rankin (Stroke Patients Only)       Balance Overall balance assessment: Needs assistance Sitting-balance support: No upper extremity supported Sitting balance-Leahy Scale: Fair     Standing balance support: Bilateral upper extremity supported Standing balance-Leahy Scale: Poor Standing balance comment: relant on the AD for stability                             Pertinent Vitals/Pain Pain Assessment: Faces Faces Pain Scale: No hurt    Home Living Family/patient expects to be discharged to:: Skilled nursing facility                      Prior Function Level of Independence: Needs assistance   Gait / Transfers Assistance Needed: pt a poor historian, but sound as if pt needed assist for mobility lately           Hand Dominance        Extremity/Trunk Assessment        Lower Extremity Assessment Lower Extremity Assessment: Generalized weakness(L UE incoordination, gen. weakness, LE's left notably worse)    Cervical / Trunk Assessment Cervical / Trunk Assessment: Kyphotic  Communication      Cognition Arousal/Alertness: Awake/alert Behavior During Therapy: Anxious;WFL for tasks assessed/performed Overall Cognitive Status: No family/caregiver present to determine baseline cognitive functioning  General Comments: tangential thought, poor historian, follow simple instruction.      General Comments      Exercises     Assessment/Plan    PT Assessment Patient needs continued PT services  PT Problem List Decreased strength;Decreased activity tolerance;Decreased balance;Decreased mobility       PT Treatment Interventions Gait training;DME instruction;Functional mobility training;Therapeutic activities;Balance training;Patient/family education    PT Goals (Current goals can be found in the Care  Plan section)  Acute Rehab PT Goals Patient Stated Goal: pt agreed he like to be more independent PT Goal Formulation: With patient Time For Goal Achievement: 12/13/16 Potential to Achieve Goals: Fair    Frequency Min 2X/week   Barriers to discharge        Co-evaluation               AM-PAC PT "6 Clicks" Daily Activity  Outcome Measure Difficulty turning over in bed (including adjusting bedclothes, sheets and blankets)?: A Little Difficulty moving from lying on back to sitting on the side of the bed? : A Little Difficulty sitting down on and standing up from a chair with arms (e.g., wheelchair, bedside commode, etc,.)?: Unable Help needed moving to and from a bed to chair (including a wheelchair)?: A Little Help needed walking in hospital room?: A Little Help needed climbing 3-5 steps with a railing? : A Lot 6 Click Score: 15    End of Session   Activity Tolerance: Patient limited by fatigue;Patient tolerated treatment well Patient left: in bed;with call bell/phone within reach;with bed alarm set Nurse Communication: Mobility status PT Visit Diagnosis: Unsteadiness on feet (R26.81);Muscle weakness (generalized) (M62.81)    Time: 4801-6553 PT Time Calculation (min) (ACUTE ONLY): 32 min   Charges:   PT Evaluation $PT Eval Moderate Complexity: 1 Mod PT Treatments $Gait Training: 8-22 mins   PT G Codes:        12/28/16  Donnella Sham, PT [phone]  (pager)  Tessie Fass Bemnet Trovato 12/28/2016, 4:02 PM

## 2016-12-09 ENCOUNTER — Other Ambulatory Visit: Payer: Self-pay | Admitting: Oncology

## 2016-12-10 ENCOUNTER — Telehealth: Payer: Self-pay | Admitting: Oncology

## 2016-12-10 ENCOUNTER — Other Ambulatory Visit: Payer: Self-pay | Admitting: Oncology

## 2016-12-10 ENCOUNTER — Other Ambulatory Visit: Payer: Self-pay

## 2016-12-10 DIAGNOSIS — C8310 Mantle cell lymphoma, unspecified site: Secondary | ICD-10-CM

## 2016-12-10 NOTE — Progress Notes (Addendum)
[name] [name]  Telephone:([phone] [phone]     ID: [name] [name] DOB: 03-13-1950  MR#: [id number]  QJF#:[id number]  Patient Care Team: Wenda Low, MD as PCP - General (Internal Medicine) Magrinat, [name], MD as Consulting Physician (Oncology) Scot Dock, NP OTHER MD:  CHIEF COMPLAINT: mantle Cell lymphoma  CURRENT TREATMENT: Rituximab weekly   HISTORY OF CURRENT ILLNESS: [name] [name] presented to the hospital on 11/27/16 as he presented to IR as outpatient for elective biopsy of right neck mass, and was found to have Atrial fibrillation with RVR and was sent to ED.  His cardiac issues stabilized, and he underwent bone marrow biopsy for further work up of his mantle cell lymphoma.  He was started on Prednisone, and discharged in stable condition back to his nursing home with f/u here to start on Rituximab.    The patient's subsequent history is as detailed below.  INTERVAL HISTORY:  [name] [name] is doing well today.  He is with nursing assistant [name] [name] today.  He is anxious about starting therapy with Rituximab.  He tells me that he is feeling well.  He says his biopsy site is feeling better.  He does have a sore spot under his left arm that he is requesting some ice for when he goes back to the infusion room.  He also requests to be in a recliner.    REVIEW OF SYSTEMS:  [name] is feeling well today.  In discussing with his nursing assistant about his activity level, he requires assistance to do most things, but does not need total help.  He denies any issues today and a detailed ROS is non contributory.    PAST MEDICAL HISTORY: Past Medical History:  Diagnosis Date  . Bladder tumor   . Cataract   . CVA (cerebral infarction)    2008  . Hyperlipidemia   . Hypertension     PAST SURGICAL HISTORY: Past Surgical History:  Procedure Laterality Date  . CHOLECYSTECTOMY    . EYE SURGERY    . FLEXIBLE SIGMOIDOSCOPY N/A 11/30/2013   Procedure:  FLEXIBLE SIGMOIDOSCOPY;  Surgeon: Inda Castle, MD;  Location: [facility];  Service: Endoscopy;  Laterality: N/A;  . HOT HEMOSTASIS N/A 11/30/2013   Procedure: HOT HEMOSTASIS (ARGON PLASMA COAGULATION/BICAP);  Surgeon: Inda Castle, MD;  Location: [facility];  Service: Endoscopy;  Laterality: N/A;  . MOUTH SURGERY      FAMILY HISTORY Family History  Problem Relation Age of Onset  . Colon cancer Father   . Colon cancer Sister 10  . Hypertension Sister     GYNECOLOGIC HISTORY:  No LMP for male patient.   SOCIAL HISTORY:  Lives at [facility].      ADVANCED DIRECTIVES:    HEALTH MAINTENANCE: Social History   Tobacco Use  . Smoking status: Former Smoker    Last attempt to quit: 09/15/1985    Years since quitting: 31.2  . Smokeless tobacco: Never Used  . Tobacco comment: Started at age 36, quit in the 1980's  Substance Use Topics  . Alcohol use: No  . Drug use: No     Colonoscopy:  PAP:  Bone density:   No Known Allergies  Current Outpatient Medications  Medication Sig Dispense Refill  . acetaminophen (TYLENOL) 650 MG CR tablet Take 650 mg every 8 (eight) hours as needed by mouth (for pain or fever ("for 48 hours")).    Marland Kitchen allopurinol (ZYLOPRIM) 300 MG tablet Take 1 tablet (300 mg  total) by mouth daily.    Marland Kitchen amiodarone (PACERONE) 200 MG tablet Take 2 tablets (400 mg total) by mouth 2 (two) times daily for 4 days, THEN 1 tablet (200 mg total) daily.    Marland Kitchen aspirin 81 MG chewable tablet Chew 1 tablet (81 mg total) by mouth daily.    . Cholecalciferol (VITAMIN D-3) 1000 units CAPS Take 2,000 Units daily by mouth.    . diltiazem (CARDIZEM CD) 240 MG 24 hr capsule Take 1 capsule (240 mg total) by mouth daily.    Marland Kitchen docusate sodium (COLACE) 100 MG capsule Take 100 mg by mouth 2 (two) times daily. For constipation    . ferrous sulfate 325 (65 FE) MG tablet Take 325 mg 2 (two) times daily with a meal by mouth.    . hydrochlorothiazide  (HYDRODIURIL) 25 MG tablet Take 25 mg by mouth daily.    Marland Kitchen ibuprofen (ADVIL,MOTRIN) 400 MG tablet     . lisinopril (PRINIVIL,ZESTRIL) 10 MG tablet     . loratadine (CLARITIN) 10 MG tablet Take 10 mg daily by mouth.    . metoprolol tartrate (LOPRESSOR) 50 MG tablet Take 1 tablet (50 mg total) by mouth 2 (two) times daily.    Marland Kitchen omeprazole (PRILOSEC) 20 MG capsule Take 1 capsule by mouth daily.    . simvastatin (ZOCOR) 40 MG tablet Take 40 mg by mouth at bedtime.     No current facility-administered medications for this visit.     OBJECTIVE:  Vitals:   12/11/16 0913  BP: 118/75  Pulse: (!) 112  Resp: 19  Temp: 98.6 F (37 C)  SpO2: 97%     Body mass index is 26.53 kg/m.   Wt Readings from Last 3 Encounters:  12/11/16 190 lb 3.2 oz (86.3 kg)  12/06/16 196 lb 12.8 oz (89.3 kg)  11/27/16 209 lb (94.8 kg)      ECOG FS:1 - Symptomatic but completely ambulatory GENERAL: Patient is a chronically ill appearing gentleman in a wheelchair today HEENT:  Sclerae anicteric.  Oropharynx clear and moist. No ulcerations or evidence of oropharyngeal candidiasis. Neck is supple.  NODES:  No cervical, or axillary lymphadenopathy palpated. Right supraclavicular mass remains, about 5cm BREAST EXAM:  Deferred. LUNGS:  Clear to auscultation bilaterally.  No wheezes or rhonchi. HEART:  Regular rate and rhythm. No murmur appreciated. ABDOMEN:  Soft, nontender.  Positive, normoactive bowel sounds. No organomegaly palpated. MSK:  No focal spinal tenderness to palpation. Full range of motion bilaterally in the upper extremities. EXTREMITIES:  No peripheral edema.   SKIN:  Clear with no obvious rashes or skin changes. No nail dyscrasia. NEURO:  Nonfocal. Well oriented.  Appropriate affect.    LAB RESULTS:  CMP     Component Value Date/Time   NA 134 (L) 12/06/2016 0432   K 5.0 12/06/2016 0432   CL 103 12/06/2016 0432   CO2 22 12/06/2016 0432   GLUCOSE 118 (H) 12/06/2016 0432   BUN 16 12/06/2016  0432   CREATININE 0.66 12/06/2016 0432   CALCIUM 8.3 (L) 12/06/2016 0432   PROT 5.5 (L) 12/06/2016 0432   ALBUMIN 2.0 (L) 12/06/2016 0432   AST 37 12/06/2016 0432   ALT 17 12/06/2016 0432   ALKPHOS 78 12/06/2016 0432   BILITOT 1.3 (H) 12/06/2016 0432   GFRNONAA >60 12/06/2016 0432   GFRAA >60 12/06/2016 0432    No results found for: TOTALPROTELP, ALBUMINELP, A1GS, A2GS, BETS, BETA2SER, GAMS, MSPIKE, SPEI  No results found for: KPAFRELGTCHN, LAMBDASER, KAPLAMBRATIO  Lab Results  Component Value Date   WBC 3.8 (L) 12/06/2016   HGB 9.3 (L) 12/06/2016   HCT 29.8 (L) 12/06/2016   MCV 89.2 12/06/2016   PLT 195 12/06/2016      Chemistry      Component Value Date/Time   NA 134 (L) 12/06/2016 0432   K 5.0 12/06/2016 0432   CL 103 12/06/2016 0432   CO2 22 12/06/2016 0432   BUN 16 12/06/2016 0432   CREATININE 0.66 12/06/2016 0432      Component Value Date/Time   CALCIUM 8.3 (L) 12/06/2016 0432   ALKPHOS 78 12/06/2016 0432   AST 37 12/06/2016 0432   ALT 17 12/06/2016 0432   BILITOT 1.3 (H) 12/06/2016 0432       No results found for: LABCA2  No components found for: XVQMGQ676  No results for input(s): INR in the last 168 hours.  No results found for: LABCA2  No results found for: PPJ093  No results found for: OIZ124  No results found for: PYK998  No results found for: CA2729  No components found for: HGQUANT  No results found for: CEA1 / No results found for: CEA1   No results found for: AFPTUMOR  No results found for: CHROMOGRNA  No results found for: PSA1  No visits with results within 3 Day(s) from this visit.  Latest known visit with results is:  Admission on 11/27/2016, Discharged on 12/06/2016  No results displayed because visit has over 200 results.      (this displays the last labs from the last 3 days)  No results found for: TOTALPROTELP, ALBUMINELP, A1GS, A2GS, BETS, BETA2SER, GAMS, MSPIKE, SPEI (this displays SPEP labs)  No results  found for: KPAFRELGTCHN, LAMBDASER, KAPLAMBRATIO (kappa/lambda light chains)  No results found for: HGBA, HGBA2QUANT, HGBFQUANT, HGBSQUAN (Hemoglobinopathy evaluation)   Lab Results  Component Value Date   LDH 208 (H) 12/06/2016    Lab Results  Component Value Date   IRON 15 (L) 11/28/2016   TIBC 190 (L) 11/28/2016   IRONPCTSAT 8 (L) 11/28/2016   (Iron and TIBC)  Lab Results  Component Value Date   FERRITIN 392 (H) 11/28/2016    Urinalysis    Component Value Date/Time   COLORURINE AMBER (A) 12/05/2016 1541   APPEARANCEUR HAZY (A) 12/05/2016 1541   LABSPEC 1.021 12/05/2016 1541   PHURINE 5.0 12/05/2016 1541   GLUCOSEU NEGATIVE 12/05/2016 1541   HGBUR NEGATIVE 12/05/2016 1541   BILIRUBINUR NEGATIVE 12/05/2016 1541   KETONESUR NEGATIVE 12/05/2016 1541   PROTEINUR NEGATIVE 12/05/2016 1541   NITRITE NEGATIVE 12/05/2016 1541   LEUKOCYTESUR NEGATIVE 12/05/2016 1541     STUDIES: Dg Chest 2 View  Result Date: 11/30/2016 CLINICAL DATA:  Fever today. EXAM: CHEST  2 VIEW COMPARISON:  CT chest 11/27/2016.  Chest radiograph 11/27/2016. FINDINGS: Mild cardiac enlargement. No vascular congestion. No airspace disease or consolidation in the lungs. No blunting of costophrenic angles. No pneumothorax. Calcification of the aorta. Prominent lymphadenopathy demonstrated at CT is not well visualized radiographically. IMPRESSION: Cardiac enlargement.  No evidence of active pulmonary disease. Electronically Signed   By: Lucienne Capers M.D.   On: 11/30/2016 23:36   Ct Soft Tissue Neck Wo Contrast  Result Date: 11/21/2016 CLINICAL DATA:  Initial evaluation for right-sided neck swelling. EXAM: CT NECK WITHOUT CONTRAST TECHNIQUE: Multidetector CT imaging of the neck was performed following the standard protocol without intravenous contrast. COMPARISON:  None available. FINDINGS: Pharynx and larynx: Oral cavity within normal limits without mass lesion or loculated  fluid collection. Patient is  edentulous. Palatine tonsils symmetric and within normal limits bilaterally. Few small calcified tonsilliths noted. Parapharyngeal fat preserved. Nasopharynx within normal limits. Retropharyngeal soft tissues demonstrate no acute abnormality. Epiglottis normal. Vallecula clear. Small amount of layering secretions present within the right hypopharynx, extending into the right piriform sinus. Remainder of the hypopharynx and supraglottic larynx grossly normal. True cords symmetric and within normal limits. Subglottic airway clear. Salivary glands: Salivary glands including the parotid and submandibular glands are within normal limits bilaterally. Thyroid: Thyroid within normal limits. Lymph nodes: Extensive bulky adenopathy seen throughout the neck and visualized upper chest, slightly worse on the right as compared to the left. Nodes are seen at essentially all levels, most prevalent within the right supraclavicular and axillary region. For reference purposes, largest discrete node within the right supraclavicular region measures approximately 2.6 x 5.1 cm (series 2, image 85). Largest discrete nodal mass within the right axillary region measures approximately 9.4 x 3.7 cm (series 2, image 114). In the left neck, largest discrete node seen at left level III and measures 1.8 x 2.7 cm (series 2, image 71). Largest node within the left upper chest seen within the left subpectoral region and measures 5.4 x 2.9 cm (series 2, image 106). Study nodes seen within the partially visualized upper mediastinum as well, largest of which measures 1 cm in the prevascular region. Findings highly concerning for lymphoproliferative disorder/ lymphoma. Vascular: Atherosclerotic changes noted about the aortic arch and carotid bifurcations. Limited intracranial: Unremarkable. Visualized orbits: Partially visualized globes and orbital soft tissues within normal limits. Mastoids and visualized paranasal sinuses: Chronic maxillary sinusitis  noted, right worse than left. Retained metallic density at the posterior right maxillary sinus. Sequelae of prior ORIF at the anterior maxillary sinuses/alveolar ridge. Visualize mastoids and middle ear cavities are clear. Skeleton: No acute osseus abnormality. No worrisome lytic or blastic osseous lesions. Straightening of the normal cervical lordosis with trace retrolisthesis of C3 on C4. Moderate to advanced degenerate spondylolysis at C5-6. Upper chest: Partially visualized lungs are clear. Other: None. IMPRESSION: 1. Extensive bulky adenopathy throughout the neck and visualized upper chest, right worse than left, highly concerning for lymphoproliferative disorder/lymphoma. 2. No other acute abnormality within the neck. 3. Chronic maxillary sinusitis. Electronically Signed   By: Jeannine Boga M.D.   On: 11/21/2016 15:27   Ct Angio Chest Pe W And/or Wo Contrast  Result Date: 11/27/2016 CLINICAL DATA:  Tachycardia. Suspect pulmonary embolism. History of hypertension, ex smoker, bladder tumor. Scheduled for neck biopsy today. EXAM: CT ANGIOGRAPHY CHEST WITH CONTRAST TECHNIQUE: Multidetector CT imaging of the chest was performed using the standard protocol during bolus administration of intravenous contrast. Multiplanar CT image reconstructions and MIPs were obtained to evaluate the vascular anatomy. CONTRAST:  169m ISOVUE-370 IOPAMIDOL (ISOVUE-370) INJECTION 76% COMPARISON:  CT neck November 21, 2016 and chest radiograph November 27, 2016 and CT abdomen and pelvis June 22, 2012 FINDINGS: CARDIOVASCULAR: Adequate contrast opacification of the pulmonary artery's. Main pulmonary artery is not enlarged. No pulmonary arterial filling defects to the level of the segmental branches, distal branches obscured by respiratory motion. Heart is upper limits of normal in size. Mild coronary artery calcifications. Small pericardial effusion. Thoracic aorta is normal course and caliber, unremarkable. MEDIASTINUM/NODES:  Greater than expected number of mildly enlarged mediastinal and hilar lymph nodes. Anterior mediastinal fat stranding. Bulky RIGHT greater than LEFT axillary lymphadenopathy, 12.9 x 6.7 cm RIGHT axillary nodal conglomeration. Bulky RIGHT grand LEFT supraclavicular lymphadenopathy. LUNGS/PLEURA: Tracheobronchial tree is patent, no  pneumothorax. No pleural effusions, focal consolidations, pulmonary nodules or masses. UPPER ABDOMEN: New splenomegaly. 6.7 cm cyst (5 Hounsfield units) LEFT renal fossa, larger than prior CT. Stable 3 cm cyst (3 Hounsfield units) RIGHT lobe of the liver. Partially imaged probable portal caval lymphadenopathy. MUSCULOSKELETAL: Nonacute. RIGHT biceps intramuscular lipoma versus atrophy. Multilevel scattered Schmorl's nodes. Review of the MIP images confirms the above findings. IMPRESSION: 1. No acute pulmonary embolism. 2. Bulky supraclavicular, axillary and possibly portal caval lymphadenopathy. 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 3. Borderline cardiomegaly.  No acute pulmonary process. Aortic Atherosclerosis (ICD10-I70.0). Electronically Signed   By: Elon Alas M.D.   On: 11/27/2016 17:13   Dg Chest Port 1 View  Result Date: 11/27/2016 CLINICAL DATA:  Atrial fibrillation EXAM: PORTABLE CHEST 1 VIEW COMPARISON:  None. FINDINGS: Cardiac silhouette is upper limits of normal. Mild aortic atherosclerotic calcification. No focal airspace consolidation or pulmonary edema. No pneumothorax or sizable pleural effusion. IMPRESSION: No active disease. Aortic Atherosclerosis (ICD10-I70.0). Electronically Signed   By: Ulyses Jarred M.D.   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 EXAM: CT GUIDED BONE MARROW ASPIRATES AND BIOPSY Physician: Stephan Minister. Anselm Pancoast, MD MEDICATIONS: None. ANESTHESIA/SEDATION: Fentanyl 50 mcg IV; Versed 1.0 mg IV Moderate Sedation Time:  12  minutes The patient was continuously monitored during the procedure by the interventional radiology nurse under my direct supervision. COMPLICATIONS: None immediate. PROCEDURE: The procedure was explained to the patient. The risks and benefits of the procedure were discussed and the patient's questions were addressed. Informed consent was obtained from the patient. The patient was placed prone on CT scan. Images of the pelvis were obtained. The right side of back was prepped and draped in sterile fashion. The skin and right posterior iliac bone were anesthetized with 1% lidocaine. 11 gauge bone needle was directed into the right iliac bone with CT guidance. Two aspirates and one core biopsy obtained. Bandage placed over the puncture site. FINDINGS: Bone needle directed in the right ilium. Extensive lymphadenopathy throughout the pelvis and inguinal regions. IMPRESSION: CT guided bone marrow aspirates and core biopsy. Electronically Signed   By: Markus Daft M.D.   On: 12/05/2016 10:19   [place] Core Biopsy (lymph Nodes)  Result Date: 11/28/2016 INDICATION: Bulky cervical axillary lymphadenopathy. Please perform ultrasound-guided biopsy for tissue diagnostic purposes. EXAM: ULTRASOUND-GUIDED RIGHT CERVICAL LYMPH NODE BIOPSY COMPARISON:  Neck CT - 11/21/2016; chest CT - 11/17/2016 MEDICATIONS: None ANESTHESIA/SEDATION: Moderate (conscious) sedation was employed during this procedure. A total of Versed 2 mg and Fentanyl 75 mcg was administered intravenously. Moderate Sedation Time: 10 minutes. The patient's level of consciousness and vital signs were monitored continuously by radiology nursing throughout the procedure under my direct supervision. COMPLICATIONS: None immediate. TECHNIQUE: Informed written consent was obtained from the patient after a discussion of the risks, benefits and alternatives to treatment. Questions regarding the procedure were encouraged and answered. Initial ultrasound scanning demonstrated  multiple enlarged cervical lymph nodes. A dominant approximately 3.2 x 1.6 cm lymph node within the superficial aspect of the mid right lateral neck was targeted for biopsy given lymph node location and sonographic window. An ultrasound image was saved for documentation purposes. The procedure was planned. A timeout was performed prior to the initiation of the procedure. The operative was prepped and draped in the usual sterile fashion, and a sterile drape was applied covering the operative field. A timeout was performed prior to the initiation  of the procedure. Local anesthesia was provided with 1% lidocaine with epinephrine. Under direct ultrasound guidance, an 18 gauge core needle device was utilized to obtain to obtain 6 core needle biopsies of the dominant right cervical lymph node. The samples were placed in saline and submitted to pathology. The needle was removed and hemostasis was achieved with manual compression. Post procedure scan was negative for significant hematoma. A dressing was placed. The patient tolerated the procedure well without immediate postprocedural complication. IMPRESSION: Technically successful ultrasound guided biopsy of dominant right cervical lymph node. Electronically Signed   By: Sandi Mariscal M.D.   On: 11/28/2016 13:48   US Soft Tissue Neck  Result Date: 12/01/2016 CLINICAL DATA:  66 year old male with a history of neck mass, prior biopsy, possible infection EXAM: ULTRASOUND OF HEAD/NECK SOFT TISSUES TECHNIQUE: Ultrasound examination of the head and neck soft tissues was performed in the area of clinical concern. COMPARISON:  Ultrasound 11/28/2016, prior CT 11/27/2016 FINDINGS: Grayscale and color duplex ultrasound formed in the region of clinical concern. Multiple enlarged lymph nodes again evident, as previously demonstrated on ultrasound and CT imaging. No focal fluid collection. Post biopsy changes of targeted lymph node are evident without fluid collection, gas, or  significant edema. IMPRESSION: Ultrasound survey in the region of clinical concern demonstrates no evidence of focal fluid collection or gas. Expected changes of prior biopsy of the targeted enlarged cervical lymph nodes. Electronically Signed   By: Corrie Mckusick D.O.   On: 12/01/2016 12:16   ASSESSMENT: 66 y.o. 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  He met with Dr. Jana Hakim and Verita Lamb.  We reviewed that he will start Rituximab weekly.  His labs were reviewed in detail.  Dr. Jana Hakim reviewed possible side effects.  Also, I gave the nursing home written instructions.  We reviewed that he may need a port in the future.  After discussing with Armanda Magic, RN, his veins are poor and he will need a port sooner rather than later.  I will go ahead and put in order for this today and hopefully we can schedule it for early next week.    Natalio has a good understanding of the overall plan. he agrees with it. he knows the goal of treatment in her case is cure. She will call with any problems that may develop before her next visit here.  Scot Dock, NP   12/11/2016 9:32 AM Medical Oncology and Hematology [facility] [address] [place], [place] [address] Tel. [phone]    Fax. [phone]   ADDENDUM: We are proceeding with rituximab today. If the patient can tolerate it, given his response to steroids we may not need to do bendamustine and I am holding that for now.  I would like to treat him with imbruvica but given the concerns re bleeding and Afib we are hlding off on that agent.  He has a good understanding of the possible toxicities, side effects and complications of rituximab and is agreeable to start.  I personally saw this patient and performed a substantive portion of this encounter with the listed APP documented above.   Chauncey Cruel, MD Medical Oncology and Hematology [facility] [address] [place], [place] [address] Tel. [phone]    Fax. [phone]

## 2016-12-10 NOTE — Telephone Encounter (Signed)
Spoke with Elmyra Ricks, transportation for [facility] re patient appointments for 11/29 @ 8:30 am.   Elmyra Ricks [phone] - number provided by Gabriel Cirri at the facility.

## 2016-12-11 ENCOUNTER — Ambulatory Visit (HOSPITAL_BASED_OUTPATIENT_CLINIC_OR_DEPARTMENT_OTHER): Payer: Medicare Other | Admitting: Medical

## 2016-12-11 ENCOUNTER — Other Ambulatory Visit: Payer: Self-pay

## 2016-12-11 ENCOUNTER — Encounter: Payer: Self-pay | Admitting: Adult Health

## 2016-12-11 ENCOUNTER — Other Ambulatory Visit: Payer: Self-pay | Admitting: Adult Health

## 2016-12-11 ENCOUNTER — Ambulatory Visit (HOSPITAL_BASED_OUTPATIENT_CLINIC_OR_DEPARTMENT_OTHER): Payer: Medicare Other

## 2016-12-11 ENCOUNTER — Emergency Department (HOSPITAL_COMMUNITY): Payer: Medicare Other

## 2016-12-11 ENCOUNTER — Other Ambulatory Visit: Payer: Self-pay | Admitting: Oncology

## 2016-12-11 ENCOUNTER — Ambulatory Visit (HOSPITAL_BASED_OUTPATIENT_CLINIC_OR_DEPARTMENT_OTHER): Payer: Medicare Other | Admitting: Adult Health

## 2016-12-11 ENCOUNTER — Inpatient Hospital Stay (HOSPITAL_COMMUNITY)
Admission: EM | Admit: 2016-12-11 | Discharge: 2016-12-14 | DRG: 872 | Disposition: A | Discharge status: Skilled Nursing Facility | Payer: Medicare Other | Attending: Internal Medicine | Admitting: Internal Medicine

## 2016-12-11 ENCOUNTER — Other Ambulatory Visit: Payer: Self-pay | Admitting: Emergency Medicine

## 2016-12-11 VITALS — BP 157/127 | HR 124 | Temp 98.7°F | Resp 20

## 2016-12-11 VITALS — BP 118/75 | HR 112 | Temp 98.6°F | Resp 19 | Ht 71.0 in | Wt 190.2 lb

## 2016-12-11 DIAGNOSIS — R6520 Severe sepsis without septic shock: Secondary | ICD-10-CM | POA: Diagnosis not present

## 2016-12-11 DIAGNOSIS — F70 Mild intellectual disabilities: Secondary | ICD-10-CM | POA: Diagnosis not present

## 2016-12-11 DIAGNOSIS — C8310 Mantle cell lymphoma, unspecified site: Secondary | ICD-10-CM

## 2016-12-11 DIAGNOSIS — D696 Thrombocytopenia, unspecified: Secondary | ICD-10-CM | POA: Diagnosis not present

## 2016-12-11 DIAGNOSIS — A419 Sepsis, unspecified organism: Principal | ICD-10-CM

## 2016-12-11 DIAGNOSIS — R251 Tremor, unspecified: Secondary | ICD-10-CM | POA: Diagnosis present

## 2016-12-11 DIAGNOSIS — Z87891 Personal history of nicotine dependence: Secondary | ICD-10-CM | POA: Diagnosis not present

## 2016-12-11 DIAGNOSIS — R591 Generalized enlarged lymph nodes: Secondary | ICD-10-CM

## 2016-12-11 DIAGNOSIS — N433 Hydrocele, unspecified: Secondary | ICD-10-CM | POA: Diagnosis not present

## 2016-12-11 DIAGNOSIS — I480 Paroxysmal atrial fibrillation: Secondary | ICD-10-CM | POA: Diagnosis not present

## 2016-12-11 DIAGNOSIS — C8311 Mantle cell lymphoma, lymph nodes of head, face, and neck: Secondary | ICD-10-CM

## 2016-12-11 DIAGNOSIS — R599 Enlarged lymph nodes, unspecified: Secondary | ICD-10-CM | POA: Diagnosis not present

## 2016-12-11 DIAGNOSIS — D630 Anemia in neoplastic disease: Secondary | ICD-10-CM | POA: Diagnosis not present

## 2016-12-11 DIAGNOSIS — D126 Benign neoplasm of colon, unspecified: Secondary | ICD-10-CM

## 2016-12-11 DIAGNOSIS — Z79899 Other long term (current) drug therapy: Secondary | ICD-10-CM | POA: Diagnosis not present

## 2016-12-11 DIAGNOSIS — R262 Difficulty in walking, not elsewhere classified: Secondary | ICD-10-CM | POA: Diagnosis not present

## 2016-12-11 DIAGNOSIS — Z5112 Encounter for antineoplastic immunotherapy: Secondary | ICD-10-CM | POA: Diagnosis not present

## 2016-12-11 DIAGNOSIS — R002 Palpitations: Secondary | ICD-10-CM | POA: Diagnosis present

## 2016-12-11 DIAGNOSIS — R17 Unspecified jaundice: Secondary | ICD-10-CM | POA: Diagnosis present

## 2016-12-11 DIAGNOSIS — D649 Anemia, unspecified: Secondary | ICD-10-CM

## 2016-12-11 DIAGNOSIS — Z8673 Personal history of transient ischemic attack (TIA), and cerebral infarction without residual deficits: Secondary | ICD-10-CM | POA: Diagnosis not present

## 2016-12-11 DIAGNOSIS — N5089 Other specified disorders of the male genital organs: Secondary | ICD-10-CM

## 2016-12-11 DIAGNOSIS — Z8249 Family history of ischemic heart disease and other diseases of the circulatory system: Secondary | ICD-10-CM | POA: Diagnosis not present

## 2016-12-11 DIAGNOSIS — R0602 Shortness of breath: Secondary | ICD-10-CM | POA: Diagnosis not present

## 2016-12-11 DIAGNOSIS — E785 Hyperlipidemia, unspecified: Secondary | ICD-10-CM | POA: Diagnosis not present

## 2016-12-11 DIAGNOSIS — R509 Fever, unspecified: Secondary | ICD-10-CM | POA: Diagnosis not present

## 2016-12-11 DIAGNOSIS — I10 Essential (primary) hypertension: Secondary | ICD-10-CM | POA: Diagnosis present

## 2016-12-11 DIAGNOSIS — M7989 Other specified soft tissue disorders: Secondary | ICD-10-CM | POA: Diagnosis not present

## 2016-12-11 DIAGNOSIS — Z9221 Personal history of antineoplastic chemotherapy: Secondary | ICD-10-CM

## 2016-12-11 DIAGNOSIS — D179 Benign lipomatous neoplasm, unspecified: Secondary | ICD-10-CM | POA: Diagnosis present

## 2016-12-11 DIAGNOSIS — Z22322 Carrier or suspected carrier of Methicillin resistant Staphylococcus aureus: Secondary | ICD-10-CM | POA: Diagnosis not present

## 2016-12-11 DIAGNOSIS — I4891 Unspecified atrial fibrillation: Secondary | ICD-10-CM | POA: Diagnosis not present

## 2016-12-11 DIAGNOSIS — T451X5A Adverse effect of antineoplastic and immunosuppressive drugs, initial encounter: Secondary | ICD-10-CM | POA: Diagnosis not present

## 2016-12-11 DIAGNOSIS — Z7982 Long term (current) use of aspirin: Secondary | ICD-10-CM | POA: Diagnosis not present

## 2016-12-11 DIAGNOSIS — R41841 Cognitive communication deficit: Secondary | ICD-10-CM | POA: Diagnosis not present

## 2016-12-11 DIAGNOSIS — R590 Localized enlarged lymph nodes: Secondary | ICD-10-CM | POA: Diagnosis present

## 2016-12-11 DIAGNOSIS — Y92538 Other ambulatory health services establishments as the place of occurrence of the external cause: Secondary | ICD-10-CM | POA: Diagnosis present

## 2016-12-11 DIAGNOSIS — M6281 Muscle weakness (generalized): Secondary | ICD-10-CM | POA: Diagnosis not present

## 2016-12-11 LAB — CBC WITH DIFFERENTIAL/PLATELET
BAND NEUTROPHILS: 6 %
BASO%: 0.5 % (ref 0.0–2.0)
BASOS ABS: 0 10*3/uL (ref 0.0–0.1)
BASOS ABS: 0 10*3/uL (ref 0.0–0.1)
BASOS PCT: 0 %
Blasts: 0 %
EOS ABS: 0 10*3/uL (ref 0.0–0.5)
EOS ABS: 0 10*3/uL (ref 0.0–0.7)
EOS PCT: 0 %
EOS%: 0.3 % (ref 0.0–7.0)
HCT: 31.2 % — ABNORMAL LOW (ref 38.4–49.9)
HEMATOCRIT: 29.4 % — AB (ref 39.0–52.0)
HGB: 10 g/dL — ABNORMAL LOW (ref 13.0–17.1)
Hemoglobin: 8.9 g/dL — ABNORMAL LOW (ref 13.0–17.0)
LYMPH#: 1.3 10*3/uL (ref 0.9–3.3)
LYMPH%: 19.7 % (ref 14.0–49.0)
LYMPHS ABS: 0.8 10*3/uL (ref 0.7–4.0)
Lymphocytes Relative: 18 %
MCH: 28.4 pg (ref 27.2–33.4)
MCH: 28 pg (ref 26.0–34.0)
MCHC: 32.2 g/dL (ref 32.0–36.0)
MCHC: 30.3 g/dL (ref 30.0–36.0)
MCV: 88.2 fL (ref 79.3–98.0)
MCV: 92.5 fL (ref 78.0–100.0)
METAMYELOCYTES PCT: 0 %
MONO ABS: 0.2 10*3/uL (ref 0.1–1.0)
MONO#: 1 10*3/uL — AB (ref 0.1–0.9)
MONO%: 15.3 % — AB (ref 0.0–14.0)
MONOS PCT: 4 %
Myelocytes: 1 %
NEUT%: 64.2 % (ref 39.0–75.0)
NEUTROS ABS: 4.4 10*3/uL (ref 1.5–6.5)
NEUTROS ABS: 3.7 10*3/uL (ref 1.7–7.7)
Neutrophils Relative %: 71 %
Other: 0 %
Platelets: 178 10*3/uL (ref 140–400)
Platelets: 111 10*3/uL — ABNORMAL LOW (ref 150–400)
Promyelocytes Absolute: 0 %
RBC: 3.54 10*6/uL — AB (ref 4.20–5.82)
RBC: 3.18 MIL/uL — ABNORMAL LOW (ref 4.22–5.81)
RDW: 17.9 % — ABNORMAL HIGH (ref 11.0–14.6)
RDW: 17.8 % — ABNORMAL HIGH (ref 11.5–15.5)
WBC: 6.8 10*3/uL (ref 4.0–10.3)
WBC: 4.7 10*3/uL (ref 4.0–10.5)
nRBC: 1 /100 WBC — ABNORMAL HIGH

## 2016-12-11 LAB — URINALYSIS, ROUTINE W REFLEX MICROSCOPIC
Glucose, UA: NEGATIVE mg/dL
Hgb urine dipstick: NEGATIVE
KETONES UR: 5 mg/dL — AB
Leukocytes, UA: NEGATIVE
Nitrite: NEGATIVE
PH: 6 (ref 5.0–8.0)
PROTEIN: 100 mg/dL — AB
Specific Gravity, Urine: 1.023 (ref 1.005–1.030)

## 2016-12-11 LAB — COMPREHENSIVE METABOLIC PANEL
ALT: 18 U/L (ref 0–55)
AST: 41 U/L — ABNORMAL HIGH (ref 5–34)
Albumin: 2.2 g/dL — ABNORMAL LOW (ref 3.5–5.0)
Alkaline Phosphatase: 124 U/L (ref 40–150)
Anion Gap: 11 mEq/L (ref 3–11)
BUN: 19.7 mg/dL (ref 7.0–26.0)
CO2: 21 meq/L — AB (ref 22–29)
Calcium: 8.9 mg/dL (ref 8.4–10.4)
Chloride: 108 mEq/L (ref 98–109)
Creatinine: 0.8 mg/dL (ref 0.7–1.3)
GLUCOSE: 90 mg/dL (ref 70–140)
POTASSIUM: 4.9 meq/L (ref 3.5–5.1)
SODIUM: 140 meq/L (ref 136–145)
Total Bilirubin: 2.31 mg/dL — ABNORMAL HIGH (ref 0.20–1.20)
Total Protein: 6.2 g/dL — ABNORMAL LOW (ref 6.4–8.3)

## 2016-12-11 LAB — CBG MONITORING, ED: GLUCOSE-CAPILLARY: 144 mg/dL — AB (ref 65–99)

## 2016-12-11 LAB — BASIC METABOLIC PANEL
ANION GAP: 12 (ref 5–15)
BUN: 24 mg/dL — AB (ref 6–20)
CHLORIDE: 110 mmol/L (ref 101–111)
CO2: 18 mmol/L — ABNORMAL LOW (ref 22–32)
Calcium: 8.1 mg/dL — ABNORMAL LOW (ref 8.9–10.3)
Creatinine, Ser: 1.02 mg/dL (ref 0.61–1.24)
GFR calc Af Amer: >60 mL/min (ref >60)
GLUCOSE: 145 mg/dL — AB (ref 65–99)
POTASSIUM: 5.1 mmol/L (ref 3.5–5.1)
SODIUM: 140 mmol/L (ref 135–145)

## 2016-12-11 LAB — PROTIME-INR
INR: 1.52
Prothrombin Time: 18.2 seconds — ABNORMAL HIGH (ref 11.4–15.2)

## 2016-12-11 LAB — LACTIC ACID, PLASMA: Lactic Acid, Venous: 2.3 mmol/L (ref 0.5–1.9)

## 2016-12-11 LAB — TROPONIN I: Troponin I: 0.06 ng/mL (ref <0.03)

## 2016-12-11 LAB — I-STAT CG4 LACTIC ACID, ED: Lactic Acid, Venous: 4.17 mmol/L (ref 0.5–1.9)

## 2016-12-11 LAB — TECHNOLOGIST REVIEW

## 2016-12-11 LAB — PROCALCITONIN: Procalcitonin: 39.21 ng/mL

## 2016-12-11 LAB — CHROMOSOME ANALYSIS, BONE MARROW

## 2016-12-11 LAB — APTT: aPTT: 28 seconds (ref 24–36)

## 2016-12-11 MED ORDER — HYDRALAZINE HCL 20 MG/ML IJ SOLN: 5.0000 mg | INTRAMUSCULAR | Status: DC | PRN

## 2016-12-11 MED ORDER — FAMOTIDINE IN NACL 20-0.9 MG/50ML-% IV SOLN
20.0000 mg | Freq: Once | INTRAVENOUS | Status: AC | PRN
  Administered 2016-12-11: 20 mg via INTRAVENOUS

## 2016-12-11 MED ORDER — ALBUTEROL SULFATE (2.5 MG/3ML) 0.083% IN NEBU
5.0000 mg | INHALATION_SOLUTION | Freq: Once | RESPIRATORY_TRACT | Status: AC
  Administered 2016-12-11: 5 mg via RESPIRATORY_TRACT
  Filled 2016-12-11: qty 6

## 2016-12-11 MED ORDER — SODIUM CHLORIDE 0.9 % IV SOLN
INTRAVENOUS | Status: DC
  Administered 2016-12-11 – 2016-12-12 (×2): via INTRAVENOUS

## 2016-12-11 MED ORDER — SODIUM CHLORIDE 0.9 % IV BOLUS (SEPSIS)
1000.0000 mL | Freq: Once | INTRAVENOUS | Status: AC
  Administered 2016-12-11: 1000 mL via INTRAVENOUS

## 2016-12-11 MED ORDER — VITAMIN D 1000 UNITS PO TABS
1000.0000 [IU] | ORAL_TABLET | Freq: Every day | ORAL | Status: DC
  Administered 2016-12-12 – 2016-12-14 (×3): 1000 [IU] via ORAL
  Filled 2016-12-11 (×3): qty 1

## 2016-12-11 MED ORDER — AMIODARONE HCL 200 MG PO TABS
200.0000 mg | ORAL_TABLET | Freq: Every day | ORAL | Status: DC
  Administered 2016-12-12 – 2016-12-14 (×3): 200 mg via ORAL
  Filled 2016-12-11 (×3): qty 1

## 2016-12-11 MED ORDER — ZOLPIDEM TARTRATE 5 MG PO TABS
5.0000 mg | ORAL_TABLET | Freq: Every evening | ORAL | Status: DC | PRN
  Administered 2016-12-13: 5 mg via ORAL
  Filled 2016-12-11: qty 1

## 2016-12-11 MED ORDER — SODIUM CHLORIDE 0.9 % IV SOLN
Freq: Once | INTRAVENOUS | Status: AC
  Administered 2016-12-11: 11:00:00 via INTRAVENOUS

## 2016-12-11 MED ORDER — DILTIAZEM LOAD VIA INFUSION
10.0000 mg | Freq: Once | INTRAVENOUS | Status: AC
  Administered 2016-12-11: 10 mg via INTRAVENOUS
  Filled 2016-12-11: qty 10

## 2016-12-11 MED ORDER — FAMOTIDINE IN NACL 20-0.9 MG/50ML-% IV SOLN
20.0000 mg | Freq: Two times a day (BID) | INTRAVENOUS | Status: DC
  Administered 2016-12-12 (×3): 20 mg via INTRAVENOUS
  Filled 2016-12-11 (×3): qty 50

## 2016-12-11 MED ORDER — METHYLPREDNISOLONE SODIUM SUCC 125 MG IJ SOLR
125.0000 mg | Freq: Once | INTRAMUSCULAR | Status: AC | PRN
  Administered 2016-12-11: 125 mg via INTRAVENOUS

## 2016-12-11 MED ORDER — DILTIAZEM HCL-DEXTROSE 100-5 MG/100ML-% IV SOLN (PREMIX): 5.0000 mg/h | INTRAVENOUS | Status: DC

## 2016-12-11 MED ORDER — PIPERACILLIN-TAZOBACTAM 3.375 G IVPB 30 MIN
3.3750 g | Freq: Once | INTRAVENOUS | Status: AC
  Administered 2016-12-11: 3.375 g via INTRAVENOUS
  Filled 2016-12-11: qty 50

## 2016-12-11 MED ORDER — PANTOPRAZOLE SODIUM 40 MG PO TBEC
40.0000 mg | DELAYED_RELEASE_TABLET | Freq: Every day | ORAL | Status: DC
  Administered 2016-12-12 – 2016-12-14 (×4): 40 mg via ORAL
  Filled 2016-12-11 (×4): qty 1

## 2016-12-11 MED ORDER — ALBUTEROL SULFATE (2.5 MG/3ML) 0.083% IN NEBU
INHALATION_SOLUTION | RESPIRATORY_TRACT | Status: AC
  Administered 2016-12-11: 5 mg via RESPIRATORY_TRACT
  Filled 2016-12-11: qty 3

## 2016-12-11 MED ORDER — ALLOPURINOL 300 MG PO TABS
300.0000 mg | ORAL_TABLET | Freq: Every day | ORAL | Status: DC
  Administered 2016-12-12 – 2016-12-14 (×3): 300 mg via ORAL
  Filled 2016-12-11: qty 1
  Filled 2016-12-11: qty 3
  Filled 2016-12-11: qty 1
  Filled 2016-12-11: qty 3
  Filled 2016-12-11: qty 1

## 2016-12-11 MED ORDER — DIPHENHYDRAMINE HCL 25 MG PO CAPS
25.0000 mg | ORAL_CAPSULE | Freq: Once | ORAL | Status: AC
  Administered 2016-12-11: 25 mg via ORAL

## 2016-12-11 MED ORDER — ACETAMINOPHEN 650 MG RE SUPP: 650.0000 mg | Freq: Four times a day (QID) | RECTAL | Status: DC | PRN

## 2016-12-11 MED ORDER — HYDROXYZINE HCL 10 MG PO TABS
10.0000 mg | ORAL_TABLET | Freq: Three times a day (TID) | ORAL | Status: DC | PRN
  Administered 2016-12-13: 10 mg via ORAL
  Filled 2016-12-11: qty 1

## 2016-12-11 MED ORDER — METHYLPREDNISOLONE SODIUM SUCC 125 MG IJ SOLR
60.0000 mg | Freq: Two times a day (BID) | INTRAMUSCULAR | Status: DC
  Administered 2016-12-12: 60 mg via INTRAVENOUS
  Filled 2016-12-11: qty 2

## 2016-12-11 MED ORDER — PALONOSETRON HCL INJECTION 0.25 MG/5ML
INTRAVENOUS | Status: AC
  Filled 2016-12-11: qty 5

## 2016-12-11 MED ORDER — ACETAMINOPHEN 325 MG PO TABS
ORAL_TABLET | ORAL | Status: AC
Start: 2016-12-11 — End: 2016-12-11
  Filled 2016-12-11: qty 2

## 2016-12-11 MED ORDER — ACETAMINOPHEN 325 MG PO TABS
650.0000 mg | ORAL_TABLET | Freq: Once | ORAL | Status: AC
  Administered 2016-12-11: 650 mg via ORAL
  Filled 2016-12-11: qty 2

## 2016-12-11 MED ORDER — SODIUM CHLORIDE 0.9 % IV BOLUS (SEPSIS)
1000.0000 mL | Freq: Once | INTRAVENOUS | Status: AC
Start: 2016-12-11 — End: 2016-12-11
  Administered 2016-12-11: 1000 mL via INTRAVENOUS

## 2016-12-11 MED ORDER — ACETAMINOPHEN 325 MG PO TABS
650.0000 mg | ORAL_TABLET | Freq: Once | ORAL | Status: AC
  Administered 2016-12-11: 650 mg via ORAL

## 2016-12-11 MED ORDER — MEPERIDINE HCL 25 MG/ML IJ SOLN
INTRAMUSCULAR | Status: AC
  Filled 2016-12-11: qty 1

## 2016-12-11 MED ORDER — ENOXAPARIN SODIUM 40 MG/0.4ML ~~LOC~~ SOLN
40.0000 mg | Freq: Every day | SUBCUTANEOUS | Status: DC
  Administered 2016-12-12: 40 mg via SUBCUTANEOUS
  Filled 2016-12-11: qty 0.4

## 2016-12-11 MED ORDER — METHYLPREDNISOLONE SODIUM SUCC 125 MG IJ SOLR
125.0000 mg | Freq: Once | INTRAMUSCULAR | Status: AC
  Administered 2016-12-11: 125 mg via INTRAVENOUS

## 2016-12-11 MED ORDER — VANCOMYCIN HCL IN DEXTROSE 1-5 GM/200ML-% IV SOLN
1000.0000 mg | Freq: Once | INTRAVENOUS | Status: AC
  Administered 2016-12-11: 1000 mg via INTRAVENOUS
  Filled 2016-12-11: qty 200

## 2016-12-11 MED ORDER — SODIUM CHLORIDE 0.9 % IV SOLN
800.0000 mg | Freq: Once | INTRAVENOUS | Status: AC
  Administered 2016-12-11: 800 mg via INTRAVENOUS
  Filled 2016-12-11: qty 50

## 2016-12-11 MED ORDER — FAMOTIDINE IN NACL 20-0.9 MG/50ML-% IV SOLN
20.0000 mg | Freq: Two times a day (BID) | INTRAVENOUS | Status: AC
  Administered 2016-12-11: 20 mg via INTRAVENOUS

## 2016-12-11 MED ORDER — DIPHENHYDRAMINE HCL 25 MG PO CAPS
ORAL_CAPSULE | ORAL | Status: AC
  Filled 2016-12-11: qty 2

## 2016-12-11 MED ORDER — METOPROLOL TARTRATE 50 MG PO TABS
50.0000 mg | ORAL_TABLET | Freq: Two times a day (BID) | ORAL | Status: DC
  Administered 2016-12-12 – 2016-12-14 (×6): 50 mg via ORAL
  Filled 2016-12-11 (×3): qty 2
  Filled 2016-12-11 (×2): qty 1
  Filled 2016-12-11: qty 2

## 2016-12-11 MED ORDER — SIMVASTATIN 40 MG PO TABS: 40.0000 mg | ORAL_TABLET | Freq: Every day | ORAL | Status: DC

## 2016-12-11 MED ORDER — DILTIAZEM HCL-DEXTROSE 100-5 MG/100ML-% IV SOLN (PREMIX)
5.0000 mg/h | Freq: Once | INTRAVENOUS | Status: AC
  Administered 2016-12-11: 5 mg/h via INTRAVENOUS
  Filled 2016-12-11: qty 100

## 2016-12-11 MED ORDER — FERROUS SULFATE 325 (65 FE) MG PO TABS
325.0000 mg | ORAL_TABLET | Freq: Every day | ORAL | Status: DC
  Administered 2016-12-12 – 2016-12-14 (×3): 325 mg via ORAL
  Filled 2016-12-11 (×3): qty 1

## 2016-12-11 MED ORDER — ASPIRIN 81 MG PO CHEW
81.0000 mg | CHEWABLE_TABLET | Freq: Every day | ORAL | Status: DC
  Administered 2016-12-12 – 2016-12-14 (×3): 81 mg via ORAL
  Filled 2016-12-11 (×3): qty 1

## 2016-12-11 MED ORDER — ONDANSETRON HCL 4 MG/2ML IJ SOLN: 4.0000 mg | Freq: Four times a day (QID) | INTRAMUSCULAR | Status: DC | PRN

## 2016-12-11 MED ORDER — DIPHENHYDRAMINE HCL 50 MG/ML IJ SOLN
INTRAMUSCULAR | Status: AC
  Administered 2016-12-11: 50 mg
  Filled 2016-12-11: qty 1

## 2016-12-11 MED ORDER — ONDANSETRON HCL 4 MG PO TABS: 4.0000 mg | ORAL_TABLET | Freq: Four times a day (QID) | ORAL | Status: DC | PRN

## 2016-12-11 MED ORDER — ACETAMINOPHEN 325 MG PO TABS: 650.0000 mg | ORAL_TABLET | Freq: Four times a day (QID) | ORAL | Status: DC | PRN

## 2016-12-11 MED ORDER — SODIUM CHLORIDE 0.9 % IV SOLN
20.0000 mg | Freq: Once | INTRAVENOUS | Status: AC
  Administered 2016-12-11: 20 mg via INTRAVENOUS
  Filled 2016-12-11: qty 2

## 2016-12-11 MED ORDER — DIPHENHYDRAMINE HCL 50 MG/ML IJ SOLN
25.0000 mg | Freq: Once | INTRAMUSCULAR | Status: AC
  Administered 2016-12-11: 25 mg via INTRAVENOUS

## 2016-12-11 MED ORDER — DILTIAZEM HCL-DEXTROSE 100-5 MG/100ML-% IV SOLN (PREMIX)
5.0000 mg/h | Freq: Once | INTRAVENOUS | Status: AC
  Administered 2016-12-11: 15 mg/h via INTRAVENOUS
  Filled 2016-12-11: qty 100

## 2016-12-11 MED ORDER — LORATADINE 10 MG PO TABS
10.0000 mg | ORAL_TABLET | Freq: Every day | ORAL | Status: DC
Start: 2016-12-12 — End: 2016-12-14
  Administered 2016-12-12 – 2016-12-14 (×3): 10 mg via ORAL
  Filled 2016-12-11 (×3): qty 1

## 2016-12-11 MED ORDER — MEPERIDINE HCL 25 MG/ML IJ SOLN
25.0000 mg | Freq: Once | INTRAMUSCULAR | Status: AC
  Administered 2016-12-11: 25 mg via INTRAVENOUS

## 2016-12-11 MED ORDER — DILTIAZEM HCL ER COATED BEADS 120 MG PO CP24
240.0000 mg | ORAL_CAPSULE | Freq: Every day | ORAL | Status: DC
  Administered 2016-12-12 – 2016-12-14 (×3): 240 mg via ORAL
  Filled 2016-12-11 (×3): qty 2

## 2016-12-11 MED ORDER — DOCUSATE SODIUM 100 MG PO CAPS
100.0000 mg | ORAL_CAPSULE | Freq: Two times a day (BID) | ORAL | Status: DC
  Administered 2016-12-12 – 2016-12-14 (×6): 100 mg via ORAL
  Filled 2016-12-11 (×6): qty 1

## 2016-12-11 NOTE — Progress Notes (Signed)
A consult was received from an ED physician for Vanc and Zosyn per pharmacy dosing.  The patient's profile has been reviewed for ht/wt/allergies/indication/available labs. A one time order has been placed for the above antibiotics.  Further antibiotics/pharmacy consults should be ordered by admitting physician if indicated.                       Thank you, Reuel Boom, PharmD, BCPS Pager: [phone] 12/11/2016, 8:21 PM

## 2016-12-11 NOTE — Progress Notes (Signed)
1505- Per verbal order from PA Lucianne Lei pt will restart Rituxan infusion at 41 ml/hr for 21 ml.  Vitals: HR 155, RR 22, BP 168/144.  Pt has hx of a-fib. 1507- Pt diaphoretic and skin pale.  Denies any CP or SOB but appears drowsy.  PA Lucianne Lei notified. [place] at chairside.  Separate NS drip wide open.  Pt placed on 2L O2 w/[place].  HR between 115-145.  BP 91/47. 1514- Verbal order per PA Lucianne Lei to hold Rituxan infusion again.  MD will be over to evaluate.  Give NS x30 min and monitor. 1530- PA reassessed pt.  Will continue to monitor.  VS 104/63, 22 RR, 100% 2L [place], 108 HR  1539- Per verbal order PA Lucianne Lei & MD Magrinat resume Rituxan drip at 41 ml/hr for 21 ml.  Will continue to monitor.

## 2016-12-11 NOTE — ED Notes (Signed)
Bed: RESB Expected date:  Expected time:  Means of arrival:  Comments: Cancer center 

## 2016-12-11 NOTE — Patient Instructions (Signed)
[facility] Discharge Instructions for Patients Receiving Chemotherapy  Today you received the following chemotherapy agents RITUXAN  To help prevent nausea and vomiting after your treatment, we encourage you to take your nausea medication as directed- call [facility] at [phone] for additional questions  If you develop nausea and vomiting that is not controlled by your nausea medication, call the clinic.   BELOW ARE SYMPTOMS THAT SHOULD BE REPORTED IMMEDIATELY:  *FEVER GREATER THAN 100.5 F  *CHILLS WITH OR WITHOUT FEVER  NAUSEA AND VOMITING THAT IS NOT CONTROLLED WITH YOUR NAUSEA MEDICATION  *UNUSUAL SHORTNESS OF BREATH  *UNUSUAL BRUISING OR BLEEDING  TENDERNESS IN MOUTH AND THROAT WITH OR WITHOUT PRESENCE OF ULCERS  *URINARY PROBLEMS  *BOWEL PROBLEMS  UNUSUAL RASH Items with * indicate a potential emergency and should be followed up as soon as possible.  Feel free to call the clinic should you have any questions or concerns. The clinic phone number is [phone].  Please show the [facility] at check-in to the Emergency Department and triage nurse.      Rituximab injection What is this medicine? RITUXIMAB (ri TUX i mab) is a monoclonal antibody. It is used to treat certain types of cancer like non-Hodgkin lymphoma and chronic lymphocytic leukemia. It is also used to treat rheumatoid arthritis, granulomatosis with polyangiitis (or Wegener's granulomatosis), and microscopic polyangiitis. This medicine may be used for other purposes; ask your health care provider or pharmacist if you have questions. COMMON BRAND NAME(S): Rituxan What should I tell my health care provider before I take this medicine? They need to know if you have any of these conditions: -heart disease -infection (especially a virus infection such as hepatitis B, chickenpox, cold sores, or herpes) -immune system problems -irregular heartbeat -kidney disease -lung or breathing  disease, like asthma -recently received or scheduled to receive a vaccine -an unusual or allergic reaction to rituximab, mouse proteins, other medicines, foods, dyes, or preservatives -pregnant or trying to get pregnant -breast-feeding How should I use this medicine? This medicine is for infusion into a vein. It is administered in a hospital or clinic by a specially trained health care professional. A special MedGuide will be given to you by the pharmacist with each prescription and refill. Be sure to read this information carefully each time. Talk to your pediatrician regarding the use of this medicine in children. This medicine is not approved for use in children. Overdosage: If you think you have taken too much of this medicine contact a poison control center or emergency room at once. NOTE: This medicine is only for you. Do not share this medicine with others. What if I miss a dose? It is important not to miss a dose. Call your doctor or health care professional if you are unable to keep an appointment. What may interact with this medicine? -cisplatin -other medicines for arthritis like disease modifying antirheumatic drugs or tumor necrosis factor inhibitors -live virus vaccines This list may not describe all possible interactions. Give your health care provider a list of all the medicines, herbs, non-prescription drugs, or dietary supplements you use. Also tell them if you smoke, drink alcohol, or use illegal drugs. Some items may interact with your medicine. What should I watch for while using this medicine? Your condition will be monitored carefully while you are receiving this medicine. You may need blood work done while you are taking this medicine. This medicine can cause serious allergic reactions. To reduce your risk you may need to  take medicine before treatment with this medicine. Take your medicine as directed. In some patients, this medicine may cause a serious brain infection  that may cause death. If you have any problems seeing, thinking, speaking, walking, or standing, tell your doctor right away. If you cannot reach your doctor, urgently seek other source of medical care. Call your doctor or health care professional for advice if you get a fever, chills or sore throat, or other symptoms of a cold or flu. Do not treat yourself. This drug decreases your body's ability to fight infections. Try to avoid being around people who are sick. Do not become pregnant while taking this medicine or for 12 months after stopping it. Women should inform their doctor if they wish to become pregnant or think they might be pregnant. There is a potential for serious side effects to an unborn child. Talk to your health care professional or pharmacist for more information. What side effects may I notice from receiving this medicine? Side effects that you should report to your doctor or health care professional as soon as possible: -breathing problems -chest pain -dizziness or feeling faint -fast, irregular heartbeat -low blood counts - this medicine may decrease the number of white blood cells, red blood cells and platelets. You may be at increased risk for infections and bleeding. -mouth sores -redness, blistering, peeling or loosening of the skin, including inside the mouth (this can be added for any serious or exfoliative rash that could lead to hospitalization) -signs of infection - fever or chills, cough, sore throat, pain or difficulty passing urine -signs and symptoms of kidney injury like trouble passing urine or change in the amount of urine -signs and symptoms of liver injury like dark yellow or brown urine; general ill feeling or flu-like symptoms; light-colored stools; loss of appetite; nausea; right upper belly pain; unusually weak or tired; yellowing of the eyes or skin -stomach pain -vomiting Side effects that usually do not require medical attention (report to your doctor or  health care professional if they continue or are bothersome): -headache -joint pain -muscle cramps or muscle pain This list may not describe all possible side effects. Call your doctor for medical advice about side effects. You may report side effects to FDA at 1-800-FDA-1088. Where should I keep my medicine? This drug is given in a hospital or clinic and will not be stored at home. NOTE: This sheet is a summary. It may not cover all possible information. If you have questions about this medicine, talk to your doctor, pharmacist, or health care provider.  2018 Elsevier/Gold Standard (2015-08-08 15:28:09)

## 2016-12-11 NOTE — ED Notes (Signed)
Made MD aware of temperature. Orders to follow.

## 2016-12-11 NOTE — H&P (Signed)
History and Physical    [name] [name]:[id number] DOB: 04-02-1950 DOA: 12/11/2016  Referring MD/NP/PA:   PCP: Wenda Low, MD   Patient coming from:  The patient is coming from home.  At baseline, pt is independent for most of ADL.  Chief Complaint: Fever, chills  HPI: [name] [name] is a 66 y.o. male with medical history significant of hypertension, hyperlipidemia, stroke, GERD, gout, iron deficiency anemia, atrial fibrillation not on anticoagulants, mantle cell lymphoma, who presents with fever and chills.  Patient is a very poor historian, history is limited. Patient states that he developed fever, chills, rigors when he was receiving his first dose of Rituxan infusion in cancer Center at about 2 PM. He also had tachycardia. Patient was treated with Solu-Medrol and Pepcid. When I saw patient in ED, patient's symptoms have improved. He denies chest pain, SOB, nausea, vomiting, diarrhea or abdominal pain. No symptoms of UTI or unilateral weakness. Patient's right upper arm looks swollen. Per patient, this is a chronic issue since 1993 after he had injury to that arm. Patient was found to have atrial fibrillation with RVR with heart rate up to 169.  ED Course: pt was found to have WBC 4.7, lactic acid of 4.17, negative urinalysis negative, temperature 102.3, tachycardia, tachypnea, oxygen saturation to 78, but improved to 99% currently. Soft blood pressure. Negative chest x-ray. Patient is admitted to stepdown as inpatient. EDP discussed with oncologist, Dr. Joycelyn Schmid, who recommended to treat patient as sepsis.  Review of Systems:   General: has fevers, chills, no body weight gain, has fatigue HEENT: no blurry vision, hearing changes or sore throat Respiratory: no dyspnea, coughing, wheezing CV: no chest pain, has palpitations GI: no nausea, vomiting, abdominal pain, diarrhea, constipation GU: no dysuria, burning on urination, increased urinary frequency, hematuria  Ext: no leg  edema Neuro: no unilateral weakness, numbness, or tingling, no vision change or hearing loss Skin: no rash, no skin tear. MSK: No muscle spasm, no deformity, no limitation of range of movement in spin Heme: No easy bruising.  Travel history: No recent long distant travel.  Allergy: No Known Allergies  Past Medical History:  Diagnosis Date  . Bladder tumor   . Cataract   . CVA (cerebral infarction)    2008  . Hyperlipidemia   . Hypertension     Past Surgical History:  Procedure Laterality Date  . CHOLECYSTECTOMY    . EYE SURGERY    . FLEXIBLE SIGMOIDOSCOPY N/A 11/30/2013   Procedure: FLEXIBLE SIGMOIDOSCOPY;  Surgeon: Inda Castle, MD;  Location: [facility];  Service: Endoscopy;  Laterality: N/A;  . HOT HEMOSTASIS N/A 11/30/2013   Procedure: HOT HEMOSTASIS (ARGON PLASMA COAGULATION/BICAP);  Surgeon: Inda Castle, MD;  Location: [facility];  Service: Endoscopy;  Laterality: N/A;  . MOUTH SURGERY      Social History:  reports that he quit smoking about 31 years ago. he has never used smokeless tobacco. He reports that he does not drink alcohol or use drugs.  Family History:  Family History  Problem Relation Age of Onset  . Colon cancer Father   . Colon cancer Sister 89  . Hypertension Sister      Prior to Admission medications   Medication Sig Start Date End Date Taking? Authorizing Provider  acetaminophen (TYLENOL) 650 MG CR tablet Take 650 mg every 8 (eight) hours as needed by mouth (for pain or fever ("for 48 hours")).    [provider]  allopurinol (ZYLOPRIM) 300 MG tablet Take 1  tablet (300 mg total) by mouth daily. 12/07/16   Samuella Cota, MD  amiodarone (PACERONE) 200 MG tablet Take 2 tablets (400 mg total) by mouth 2 (two) times daily for 4 days, THEN 1 tablet (200 mg total) daily. 12/06/16 01/09/17  Samuella Cota, MD  aspirin 81 MG chewable tablet Chew 1 tablet (81 mg total) by mouth daily. 12/07/16   Samuella Cota, MD   Cholecalciferol (VITAMIN D-3) 1000 units CAPS Take 2,000 Units daily by mouth.    [provider]  diltiazem (CARDIZEM CD) 240 MG 24 hr capsule Take 1 capsule (240 mg total) by mouth daily. 12/07/16   Samuella Cota, MD  docusate sodium (COLACE) 100 MG capsule Take 100 mg by mouth 2 (two) times daily. For constipation    [provider]  ferrous sulfate 325 (65 FE) MG tablet Take 325 mg 2 (two) times daily with a meal by mouth.    [provider]  hydrochlorothiazide (HYDRODIURIL) 25 MG tablet Take 25 mg by mouth daily.    [provider]  ibuprofen (ADVIL,MOTRIN) 400 MG tablet  10/09/16   [provider]  lisinopril (PRINIVIL,ZESTRIL) 10 MG tablet  11/04/16   [provider]  loratadine (CLARITIN) 10 MG tablet Take 10 mg daily by mouth.    [provider]  metoprolol tartrate (LOPRESSOR) 50 MG tablet Take 1 tablet (50 mg total) by mouth 2 (two) times daily. 12/06/16   Samuella Cota, MD  omeprazole (PRILOSEC) 20 MG capsule Take 1 capsule by mouth daily. 09/17/13   [provider]  simvastatin (ZOCOR) 40 MG tablet Take 40 mg by mouth at bedtime.    [provider]    Physical Exam: Vitals:   12/12/16 0100 12/12/16 0200 12/12/16 0300 12/12/16 0400  BP:  93/67 (!) 102/57 101/62  Pulse: (!) 102 68 60 70  Resp: (!) 21 (!) 22 18 (!) 23  Temp:    97.8 F (36.6 C)  TempSrc:    Oral  SpO2: 97% 97% 97% 98%  Weight:      Height:       General: Not in acute distress HEENT:       Eyes: PERRL, EOMI, no scleral icterus.       ENT: No discharge from the ears and nose, no pharynx injection, no tonsillar enlargement.        Neck: No JVD, no bruit, no mass felt. Heme: No neck lymph node enlargement. Cardiac: S1/S2, irregularly irregular rhythm, No murmurs, No gallops or rubs. Respiratory: No rales, wheezing, rhonchi or rubs. GI: Soft, nondistended, nontender, no rebound pain, no organomegaly, BS present. GU: No  hematuria Ext: No pitting leg edema bilaterally. 2+DP/PT pulse bilaterally. Musculoskeletal: No joint deformities, No joint redness or warmth, no limitation of ROM in spin. Skin: No rashes.  Neuro: Alert, oriented X3, cranial nerves II-XII grossly intact, moves all extremities normally. Psych: Patient is not psychotic, no suicidal or hemocidal ideation.  Labs on Admission: I have personally reviewed following labs and imaging studies  CBC: Recent Labs  Lab 12/06/16 0432 12/11/16 0855 12/11/16 1816 12/12/16 0148  WBC 3.8* 6.8 4.7 8.0  NEUTROABS  --  4.4 3.7  --   HGB 9.3* 10.0* 8.9* 8.1*  HCT 29.8* 31.2* 29.4* 25.5*  MCV 89.2 88.2 92.5 90.4  PLT 195 178 111* 87*   Basic Metabolic Panel: Recent Labs  Lab 12/06/16 0432 12/11/16 0855 12/11/16 1816 12/12/16 0148  NA 134* 140 140 138  K  5.0 4.9 5.1 4.2  CL 103  --  110 112*  CO2 22 21* 18* 19*  GLUCOSE 118* 90 145* 177*  BUN 16 19.7 24* 22*  CREATININE 0.66 0.8 1.02 0.75  CALCIUM 8.3* 8.9 8.1* 7.4*  PHOS 4.5  --   --   --    GFR: Estimated Creatinine Clearance: 99.7 mL/min (by C-G formula based on SCr of 0.75 mg/dL). Liver Function Tests: Recent Labs  Lab 12/06/16 0432 12/11/16 0855  AST 37 41*  ALT 17 18  ALKPHOS 78 124  BILITOT 1.3* 2.31*  PROT 5.5* 6.2*  ALBUMIN 2.0* 2.2*   No results for input(s): LIPASE, AMYLASE in the last 168 hours. No results for input(s): AMMONIA in the last 168 hours. Coagulation Profile: Recent Labs  Lab 12/11/16 2253  INR 1.52   Cardiac Enzymes: Recent Labs  Lab 12/11/16 2253 12/12/16 0148  TROPONINI 0.06* 0.05*   BNP (last 3 results) No results for input(s): PROBNP in the last 8760 hours. HbA1C: No results for input(s): HGBA1C in the last 72 hours. CBG: Recent Labs  Lab 12/11/16 1818  GLUCAP 144*   Lipid Profile: No results for input(s): CHOL, HDL, LDLCALC, TRIG, CHOLHDL, LDLDIRECT in the last 72 hours. Thyroid Function Tests: Recent Labs    12/12/16 0148   TSH 1.881   Anemia Panel: No results for input(s): VITAMINB12, FOLATE, FERRITIN, TIBC, IRON, RETICCTPCT in the last 72 hours. Urine analysis:    Component Value Date/Time   COLORURINE AMBER (A) 12/11/2016 2051   APPEARANCEUR HAZY (A) 12/11/2016 2051   LABSPEC 1.023 12/11/2016 2051   PHURINE 6.0 12/11/2016 2051   GLUCOSEU NEGATIVE 12/11/2016 2051   HGBUR NEGATIVE 12/11/2016 2051   BILIRUBINUR SMALL (A) 12/11/2016 2051   KETONESUR 5 (A) 12/11/2016 2051   PROTEINUR 100 (A) 12/11/2016 2051   NITRITE NEGATIVE 12/11/2016 2051   LEUKOCYTESUR NEGATIVE 12/11/2016 2051   Sepsis Labs: @LABRCNTIP (procalcitonin:4,lacticidven:4) ) Recent Results (from the past 240 hour(s))  TECHNOLOGIST REVIEW     Status: None   Collection Time: 12/11/16  8:55 AM  Result Value Ref Range Status   Technologist Review   Final    Large Variant lymphs present- few with nucleoli, rare myelocyte seen  MRSA PCR Screening     Status: Abnormal   Collection Time: 12/12/16 12:54 AM  Result Value Ref Range Status   MRSA by PCR POSITIVE (A) NEGATIVE Final    Comment:        The GeneXpert MRSA Assay (FDA approved for NASAL specimens only), is one component of a comprehensive MRSA colonization surveillance program. It is not intended to diagnose MRSA infection nor to guide or monitor treatment for MRSA infections. RESULT CALLED TO, READ BACK BY AND VERIFIED WITHVira Blanco RN [id number] 12/12/16 A [place]      Radiological Exams on Admission: Dg Chest Portable 1 View  Result Date: 12/11/2016 CLINICAL DATA:  Shortness of breath EXAM: PORTABLE CHEST 1 VIEW COMPARISON:  11/30/2016 FINDINGS: 1756 hours. The cardio pericardial silhouette is enlarged. The lungs are clear without focal pneumonia, edema, pneumothorax or pleural effusion. The visualized bony structures of the thorax are intact. Telemetry leads overlie the chest. IMPRESSION: Stable.  No acute cardiopulmonary findings. Electronically Signed   By: Misty Stanley  M.D.   On: 12/11/2016 18:38     EKG: Independently reviewed.  Not done in ED, will get one.   Assessment/Plan Principal Problem:   Sepsis ([place]) Active Problems:   Essential hypertension   Hyperlipidemia  Atrial fibrillation with RVR (HCC)   Normocytic anemia   Mantle cell lymphoma ([place])  Sepsis [facility]): Patient meets criteria for sepsis with fever, tachycardia and tachypnea. Lactic acid is elevated at 4.17. Source of infection is not clear, urinalysis negative with chest x-rays negative. Another potential differential diagnosis is serum sickness. ED physician consulted oncologist, Dr. Joycelyn Schmid, who recommended to treat patient as sepsis at this moment.  -Admitted to stepdown as inpatient -IV vancomycin and Zosyn --will get Procalcitonin and trend lactic acid levels per sepsis protocol. -IVF: 4L of NS bolus in ED, followed by 125 cc/h  -Solu-Medrol 60 mg twice a day and  Pepcid to cover possible serum sickness   HTN:  -Continue home medications: Metoprolol -Hold lisinopril and HCTZ since his blood pressure is soft -IV hydralazine prn  HLD: -zocor  Atrial fibrillation with RVR ([place]): likely triggered by sepsis. CHA2DS2-VASc Score is 2, needs oral anticoagulation, but patient is not on AC, unclear why pt is not on AC, maybe due high risk of bleeding 2/2 lymphoma. -on cardizem gtt -check TSH  Normocytic anemia: hgb 10.0 yesterday-->8.9 -f/u by CBC  Mantle cell lymphoma [facility]): f/u by dr. Jana Hakim. He did not tolerate firsrt dose of Rituxan. -Follow up with Dr. Jana Hakim    DVT ppx: SCD Code Status: Full code Family Communication: None at bed side.   Disposition Plan:  Anticipate discharge back to previous home environment Consults called:  Oncologist, Dr. Ermalene Searing Admission status: SDU/inpation       Date of Service 12/12/2016    Ivor Costa [facility] Pager [phone]  If 7PM-7AM, please contact night-coverage [email or web address] Password [email or web address] 12/12/2016, 5:01  AM

## 2016-12-11 NOTE — Progress Notes (Signed)
At approx 1400 patient developed rigors during 1st rituxan infusion. His rate of infusion at that time was 62 ml/hr. Infusion stopped and NS ran. Elevated heartrate, but otherwise normal vital signs (see VS flowsheet). Sandi Mealy, PA-C at chairside. Administered solumedrol 125 mg and  20mg  of pepcid IV. At 1415 rigors resolved. Patient denies shortness of breath, chest pain and difficulty swallowing. Will monitor until instructed to re-start infusion.

## 2016-12-11 NOTE — Progress Notes (Signed)
PT MAR- Read Only- verified PT, name, med and dosage. Additional documentation on Flowsheet. RN aware.

## 2016-12-11 NOTE — Progress Notes (Unsigned)
1724 rigors, Lucianne Lei, PA at chairside, VO 125mg  solumedrol IVP, Rituxan stopped NS wide open, VS 991, hr 124, 2 PAO2-97% on 2L O2 BP 127/100 1725 Pepcid  1728 25mg  benadryl 1730 Vomit x 2 VO 25mg  Demerol 1732 Vomit x1 1732 VS BP 157/127 HR 70, PAO2 87%, O2 increased to 4L face mask, EKG done 1745 Pt transported to ED via [facility].

## 2016-12-11 NOTE — ED Notes (Addendum)
Date and time results received: 12/11/16 11:50 PM  Test:Lactic Critical Value: 2.3  Name of Provider Notified: Blaine Hamper, MD

## 2016-12-11 NOTE — ED Provider Notes (Signed)
[facility] DEPT Provider Note   CSN: [id number] Arrival date & time: 12/11/16  1800     History   Chief Complaint Chief Complaint  Patient presents with  . Shortness of Breath    HPI XION DEBRUYNE is a 66 y.o. male.  66 year old male with history of cancer as well as A. fib presents with tremors, dyspnea, palpitations after receiving chemotherapy just prior to arrival.  According to the staff, patient was treated for an allergic reaction with Solu-Medrol 125 IV push x2, 50 mg of Benadryl, 20 mg of Pepcid.  He was also given Demerol 20 mg.  Patient developed A. fib with rapid ventricular rate response just prior to arrival.      Past Medical History:  Diagnosis Date  . Bladder tumor   . Cataract   . CVA (cerebral infarction)    2008  . Hyperlipidemia   . Hypertension     Patient Active Problem List   Diagnosis Date Noted  . Goals of care, counseling/discussion 12/04/2016  . 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 Atrial fibrillation with RVR ([place]) 11/27/2016  . Normocytic anemia 11/27/2016  . Lymphadenopathy 11/27/2016  . Hyperlipidemia 10/24/2013  . 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 Constipation 08/05/2012  . Hyperglycemia 08/05/2012  . 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 HEMORRHAGE OF RECTUM AND ANUS 05/17/2008  . ABDOMINAL PAIN, LEFT LOWER QUADRANT 05/17/2008  . DYSLIPIDEMIA 04/07/2008  . 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 Essential hypertension 04/07/2008  . DIVERTICULOSIS, MILD 04/07/2008    Past Surgical History:  Procedure Laterality Date  . CHOLECYSTECTOMY    . EYE SURGERY    . FLEXIBLE SIGMOIDOSCOPY N/A 11/30/2013   Procedure: FLEXIBLE SIGMOIDOSCOPY;  Surgeon: Inda Castle, MD;  Location: [facility] ENDOSCOPY;  Service: Endoscopy;  Laterality: N/A;  . HOT HEMOSTASIS N/A 11/30/2013   Procedure: HOT HEMOSTASIS (ARGON PLASMA COAGULATION/BICAP);  Surgeon: Inda Castle, MD;  Location: [facility];  Service:  Endoscopy;  Laterality: N/A;  . MOUTH SURGERY         Home Medications    Prior to Admission medications   Medication Sig Start Date End Date Taking? Authorizing Provider  acetaminophen (TYLENOL) 650 MG CR tablet Take 650 mg every 8 (eight) hours as needed by mouth (for pain or fever ("for 48 hours")).    [provider]  allopurinol (ZYLOPRIM) 300 MG tablet Take 1 tablet (300 mg total) by mouth daily. 12/07/16   Samuella Cota, MD  amiodarone (PACERONE) 200 MG tablet Take 2 tablets (400 mg total) by mouth 2 (two) times daily for 4 days, THEN 1 tablet (200 mg total) daily. 12/06/16 01/09/17  Samuella Cota, MD  aspirin 81 MG chewable tablet Chew 1 tablet (81 mg total) by mouth daily. 12/07/16   Samuella Cota, MD  Cholecalciferol (VITAMIN D-3) 1000 units CAPS Take 2,000 Units daily by mouth.    [provider]  diltiazem (CARDIZEM CD) 240 MG 24 hr capsule Take 1 capsule (240 mg total) by mouth daily. 12/07/16   Samuella Cota, MD  docusate sodium (COLACE) 100 MG capsule Take 100 mg by mouth 2 (two) times daily. For constipation    [provider]  ferrous sulfate 325 (65 FE) MG tablet Take 325 mg 2 (two) times daily with a meal by mouth.    [provider]  hydrochlorothiazide (HYDRODIURIL) 25 MG tablet Take 25 mg by mouth daily.    [provider]  ibuprofen (ADVIL,MOTRIN) 400 MG  tablet  10/09/16   [provider]  lisinopril (PRINIVIL,ZESTRIL) 10 MG tablet  11/04/16   [provider]  loratadine (CLARITIN) 10 MG tablet Take 10 mg daily by mouth.    [provider]  metoprolol tartrate (LOPRESSOR) 50 MG tablet Take 1 tablet (50 mg total) by mouth 2 (two) times daily. 12/06/16   Samuella Cota, MD  omeprazole (PRILOSEC) 20 MG capsule Take 1 capsule by mouth daily. 09/17/13   [provider]  simvastatin (ZOCOR) 40 MG tablet Take 40 mg by mouth at bedtime.    [provider]    Family  History Family History  Problem Relation Age of Onset  . Colon cancer Father   . Colon cancer Sister 58  . Hypertension Sister     Social History Social History   Tobacco Use  . Smoking status: Former Smoker    Last attempt to quit: 09/15/1985    Years since quitting: 31.2  . Smokeless tobacco: Never Used  . Tobacco comment: Started at age 63, quit in the 1980's  Substance Use Topics  . Alcohol use: No  . Drug use: No     Allergies   Patient has no known allergies.   Review of Systems Review of Systems  All other systems reviewed and are negative.    Physical Exam Updated Vital Signs There were no vitals taken for this visit.  Physical Exam  Constitutional: He is oriented to person, place, and time. He appears well-developed and well-nourished.  Non-toxic appearance. No distress.  HENT:  Head: Normocephalic and atraumatic.  Eyes: Conjunctivae, EOM and lids are normal. Pupils are equal, round, and reactive to light.  Neck: Normal range of motion. Neck supple. No tracheal deviation present. No thyroid mass present.  Cardiovascular: Normal heart sounds. An irregularly irregular rhythm present. Tachycardia present. Exam reveals no gallop.  No murmur heard. Pulmonary/Chest: Effort normal and breath sounds normal. No stridor. No respiratory distress. He has no decreased breath sounds. He has no wheezes. He has no rhonchi. He has no rales.  Abdominal: Soft. Normal appearance and bowel sounds are normal. He exhibits no distension. There is no tenderness. There is no rebound and no CVA tenderness.  Musculoskeletal: Normal range of motion. He exhibits no edema or tenderness.  Neurological: He is alert and oriented to person, place, and time. He displays tremor. No cranial nerve deficit or sensory deficit. GCS eye subscore is 4. GCS verbal subscore is 5. GCS motor subscore is 6.  Skin: Skin is warm and dry. No abrasion and no rash noted.  Psychiatric: His mood appears anxious.    Nursing note and vitals reviewed.    ED Treatments / Results  Labs (all labs ordered are listed, but only abnormal results are displayed) Labs Reviewed  CBC WITH DIFFERENTIAL/PLATELET  BASIC METABOLIC PANEL    EKG  EKG Interpretation None     ED ECG REPORT   Date: 12/11/2016  Rate: 126  Rhythm: atrial fibrillation  QRS Axis: normal  Intervals: normal  ST/T Wave abnormalities: nonspecific ST changes  Conduction Disutrbances:none  Narrative Interpretation:   Old EKG Reviewed: none available  I have personally reviewed the EKG tracing and agree with the computerized printout as noted.   Radiology No results found.  Procedures Procedures (including critical care time)  Medications Ordered in ED Medications  albuterol (PROVENTIL) (2.5 MG/3ML) 0.083% nebulizer solution 5 mg (not administered)  diphenhydrAMINE (BENADRYL) 50 MG/ML injection (not administered)  sodium chloride 0.9 % bolus 1,000  mL (not administered)  0.9 %  sodium chloride infusion (not administered)  diltiazem (CARDIZEM) 100 mg in dextrose 5% 167mL (1 mg/mL) infusion (not administered)  diltiazem (CARDIZEM) 1 mg/mL load via infusion 10 mg (not administered)     Initial Impression / Assessment and Plan / ED Course  I have reviewed the triage vital signs and the nursing notes.  Pertinent labs & imaging results that were available during my care of the patient were reviewed by me and considered in my medical decision making (see chart for details).     Patient had been treated for allergic reaction to his chemotherapy agent prior to arrival.  He received the appropriate medications for this as mentioned above.  Patient was noted to be in atrial fibrillation with rapid ventricular rate response.  Cardizem IV bolus as well as drip started and titrated which decreased his heart rate.  Patient then became febrile and code sepsis initiated with blood cultures and antibiotics started.  Consultation with the  patient's oncologist was obtained x2 and he is aware of the patient's condition.  Patient is hemodynamically improved at this time.  Heart rate is much better.  Will be admitted to the hospital  CRITICAL CARE Performed by: Leota Jacobsen Total critical care time: 70 minutes Critical care time was exclusive of separately billable procedures and treating other patients. Critical care was necessary to treat or prevent imminent or life-threatening deterioration. Critical care was time spent personally by me on the following activities: development of treatment plan with patient and/or surrogate as well as nursing, discussions with consultants, evaluation of patient's response to treatment, examination of patient, obtaining history from patient or surrogate, ordering and performing treatments and interventions, ordering and review of laboratory studies, ordering and review of radiographic studies, pulse oximetry and re-evaluation of patient's condition.   Final Clinical Impressions(s) / ED Diagnoses   Final diagnoses:  None    ED Discharge Orders    None       Lacretia Leigh, MD 12/11/16 2103

## 2016-12-11 NOTE — ED Notes (Signed)
Date and time results received: 12/11/16 11:54 PM   Test: Troponin Critical Value: 0.06  Name of Provider Notified: Blaine Hamper, MD

## 2016-12-11 NOTE — ED Triage Notes (Signed)
Per cancer center RN/PA-patient receiving first dose of chemo med-patient had an allergic reaction-was given solumedrol, benadryl-chemo med was restarted and patient had another reaction-states he O2 saturations went down into the 60's

## 2016-12-11 NOTE — ED Notes (Signed)
Greg Cutter, MD of I-stat lactic results @ 2052.

## 2016-12-12 ENCOUNTER — Ambulatory Visit: Payer: Medicare Other

## 2016-12-12 ENCOUNTER — Encounter (HOSPITAL_COMMUNITY): Payer: Self-pay | Admitting: *Deleted

## 2016-12-12 DIAGNOSIS — R599 Enlarged lymph nodes, unspecified: Secondary | ICD-10-CM

## 2016-12-12 DIAGNOSIS — C8311 Mantle cell lymphoma, lymph nodes of head, face, and neck: Secondary | ICD-10-CM

## 2016-12-12 DIAGNOSIS — A419 Sepsis, unspecified organism: Principal | ICD-10-CM

## 2016-12-12 DIAGNOSIS — I4891 Unspecified atrial fibrillation: Secondary | ICD-10-CM

## 2016-12-12 DIAGNOSIS — C8310 Mantle cell lymphoma, unspecified site: Secondary | ICD-10-CM

## 2016-12-12 LAB — BASIC METABOLIC PANEL
Anion gap: 7 (ref 5–15)
BUN: 22 mg/dL — AB (ref 6–20)
CALCIUM: 7.4 mg/dL — AB (ref 8.9–10.3)
CO2: 19 mmol/L — ABNORMAL LOW (ref 22–32)
CREATININE: 0.75 mg/dL (ref 0.61–1.24)
Chloride: 112 mmol/L — ABNORMAL HIGH (ref 101–111)
GFR calc Af Amer: >60 mL/min (ref >60)
Glucose, Bld: 177 mg/dL — ABNORMAL HIGH (ref 65–99)
POTASSIUM: 4.2 mmol/L (ref 3.5–5.1)
SODIUM: 138 mmol/L (ref 135–145)

## 2016-12-12 LAB — LIPID PANEL
CHOLESTEROL: 99 mg/dL (ref 0–200)
HDL: 16 mg/dL — ABNORMAL LOW (ref >40)
LDL CALC: 70 mg/dL (ref 0–99)
TRIGLYCERIDES: 67 mg/dL (ref <150)
Total CHOL/HDL Ratio: 6.2 RATIO
VLDL: 13 mg/dL (ref 0–40)

## 2016-12-12 LAB — CBC
HCT: 25.5 % — ABNORMAL LOW (ref 39.0–52.0)
Hemoglobin: 8.1 g/dL — ABNORMAL LOW (ref 13.0–17.0)
MCH: 28.7 pg (ref 26.0–34.0)
MCHC: 31.8 g/dL (ref 30.0–36.0)
MCV: 90.4 fL (ref 78.0–100.0)
PLATELETS: 87 10*3/uL — AB (ref 150–400)
RBC: 2.82 MIL/uL — ABNORMAL LOW (ref 4.22–5.81)
RDW: 17.7 % — AB (ref 11.5–15.5)
WBC: 8 10*3/uL (ref 4.0–10.5)

## 2016-12-12 LAB — TROPONIN I: Troponin I: 0.05 ng/mL (ref <0.03)

## 2016-12-12 LAB — HEMOGLOBIN A1C
HEMOGLOBIN A1C: 5.2 % (ref 4.8–5.6)
Mean Plasma Glucose: 102.54 mg/dL

## 2016-12-12 LAB — TSH: TSH: 1.881 u[IU]/mL (ref 0.350–4.500)

## 2016-12-12 LAB — C-REACTIVE PROTEIN: CRP: 18.3 mg/dL — AB (ref <1.0)

## 2016-12-12 LAB — MRSA PCR SCREENING: MRSA BY PCR: POSITIVE — AB

## 2016-12-12 LAB — LACTIC ACID, PLASMA: LACTIC ACID, VENOUS: 1.4 mmol/L (ref 0.5–1.9)

## 2016-12-12 LAB — SEDIMENTATION RATE: SED RATE: 60 mm/h — AB (ref 0–16)

## 2016-12-12 MED ORDER — PREDNISONE 50 MG PO TABS
60.0000 mg | ORAL_TABLET | Freq: Every day | ORAL | Status: DC
  Administered 2016-12-13 – 2016-12-14 (×2): 60 mg via ORAL
  Filled 2016-12-12: qty 3
  Filled 2016-12-12: qty 1

## 2016-12-12 MED ORDER — MUPIROCIN 2 % EX OINT
1.0000 "application " | TOPICAL_OINTMENT | Freq: Two times a day (BID) | CUTANEOUS | Status: DC
  Administered 2016-12-12 – 2016-12-14 (×5): 1 via NASAL
  Filled 2016-12-12 (×2): qty 22

## 2016-12-12 MED ORDER — VANCOMYCIN HCL IN DEXTROSE 1-5 GM/200ML-% IV SOLN
1000.0000 mg | Freq: Two times a day (BID) | INTRAVENOUS | Status: DC
  Administered 2016-12-12: 1000 mg via INTRAVENOUS
  Filled 2016-12-12: qty 200

## 2016-12-12 MED ORDER — PIPERACILLIN-TAZOBACTAM 3.375 G IVPB
3.3750 g | Freq: Three times a day (TID) | INTRAVENOUS | Status: DC
  Administered 2016-12-12 – 2016-12-14 (×7): 3.375 g via INTRAVENOUS
  Filled 2016-12-12 (×8): qty 50

## 2016-12-12 MED ORDER — CHLORHEXIDINE GLUCONATE CLOTH 2 % EX PADS
6.0000 | MEDICATED_PAD | Freq: Every day | CUTANEOUS | Status: DC
  Administered 2016-12-12 – 2016-12-14 (×3): 6 via TOPICAL

## 2016-12-12 MED ORDER — ATORVASTATIN CALCIUM 20 MG PO TABS
20.0000 mg | ORAL_TABLET | Freq: Every day | ORAL | Status: DC
  Administered 2016-12-12 – 2016-12-13 (×3): 20 mg via ORAL
  Filled 2016-12-12: qty 1
  Filled 2016-12-12 (×2): qty 2

## 2016-12-12 MED ORDER — FOLIC ACID 1 MG PO TABS
1.0000 mg | ORAL_TABLET | Freq: Every day | ORAL | Status: DC
  Administered 2016-12-12 – 2016-12-14 (×3): 1 mg via ORAL
  Filled 2016-12-12 (×3): qty 1

## 2016-12-12 NOTE — Progress Notes (Signed)
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 Patient was evaluated by GI Dr Penelope Coop on 11/21, patient declined egd/colonosopy at that time. Per oncology Dr Letta Pate, change steroid to prednisone 60mg  daily for total of 5 days (counts improved in the past)  Atrial fibrillation with RVR [facility]): likely triggered by sepsis. CHA2DS2-VASc Score is 2, needs oral anticoagulation, but patient is not on AC due to anemia/thrombocytopenia and mental cell lymphoma He was last evaluated by cardiology on 11/17 -rate controlled today, continue aspirin, Cardizem, amiodarone, Lopressor   Mantle cell lymphoma [facility]): f/u by dr. Jana Hakim. He did not tolerate firsrt dose of Rituxan on 11/29.    Code Status: full  Family Communication: patient   Disposition Plan: remain in stepdown today, may be able to med tele on 12/1, return to snf when culture finalized and counts improves   Consultants:  oncology  Procedures:  none  Antibiotics:  vanc from admission to 11/30  Zosyn from admission   Objective: BP 108/70    Pulse 84   Temp 97.8 F (36.6 C) (Oral)   Resp 15   Ht 6' (1.829 m)   Wt 90.8 kg (200 lb 2.8 oz)   SpO2 96%   BMI 27.15 kg/m   Intake/Output Summary (Last 24 hours) at 12/12/2016 0749 Last data filed at 12/12/2016 3532 Gross per 24 hour  Intake 5257.5 ml  Output 225 ml  Net 5032.5 ml   Filed Weights   12/11/16 2141 12/12/16 0052  Weight: 87.1 kg (192 lb) 90.8 kg (200 lb 2.8 oz)    Exam: Patient is examined daily including today on 12/12/2016, exams remain the same as of yesterday except that has changed    General:  NAD, very poor historian (h/o MR)  Cardiovascular: IRRR  Respiratory: CTABL  Abdomen: Soft/ND/NT, positive BS  Musculoskeletal: No Edema  Neuro: alert, interactive  Data Reviewed: Basic Metabolic Panel: Recent Labs  Lab 12/06/16 0432 12/11/16 0855 12/11/16 1816 12/12/16 0148  NA 134* 140 140 138  K 5.0 4.9 5.1 4.2  CL 103  --  110 112*  CO2 22 21* 18* 19*  GLUCOSE 118* 90 145* 177*  BUN 16 19.7 24* 22*  CREATININE 0.66 0.8 1.02 0.75  CALCIUM 8.3* 8.9 8.1* 7.4*  PHOS 4.5  --   --   --    Liver Function Tests: Recent Labs  Lab 12/06/16 0432 12/11/16 0855  AST 37 41*  ALT 17 18  ALKPHOS 78 124  BILITOT 1.3* 2.31*  PROT 5.5* 6.2*  ALBUMIN 2.0* 2.2*   No results for input(s): LIPASE, AMYLASE in the last 168 hours. No  results for input(s): AMMONIA in the last 168 hours. CBC: Recent Labs  Lab 12/06/16 0432 12/11/16 0855 12/11/16 1816 12/12/16 0148  WBC 3.8* 6.8 4.7 8.0  NEUTROABS  --  4.4 3.7  --   HGB 9.3* 10.0* 8.9* 8.1*  HCT 29.8* 31.2* 29.4* 25.5*  MCV 89.2 88.2 92.5 90.4  PLT 195 178 111* 87*   Cardiac Enzymes:   Recent Labs  Lab 12/11/16 2253 12/12/16 0148  TROPONINI 0.06* 0.05*   BNP (last 3 results) No results for input(s): BNP in the last 8760 hours.  ProBNP (last 3 results) No results for input(s): PROBNP in the last 8760 hours.  CBG: Recent Labs  Lab 12/11/16 1818  GLUCAP 144*    Recent Results  (from the past 240 hour(s))  TECHNOLOGIST REVIEW     Status: None   Collection Time: 12/11/16  8:55 AM  Result Value Ref Range Status   Technologist Review   Final    Large Variant lymphs present- few with nucleoli, rare myelocyte seen  MRSA PCR Screening     Status: Abnormal   Collection Time: 12/12/16 12:54 AM  Result Value Ref Range Status   MRSA by PCR POSITIVE (A) NEGATIVE Final    Comment:        The GeneXpert MRSA Assay (FDA approved for NASAL specimens only), is one component of a comprehensive MRSA colonization surveillance program. It is not intended to diagnose MRSA infection nor to guide or monitor treatment for MRSA infections. RESULT CALLED TO, READ BACK BY AND VERIFIED WITHVira Blanco RN [id number] 12/12/16 A NAVARRO      Studies: Dg Chest Portable 1 View  Result Date: 12/11/2016 CLINICAL DATA:  Shortness of breath EXAM: PORTABLE CHEST 1 VIEW COMPARISON:  11/30/2016 FINDINGS: 1756 hours. The cardio pericardial silhouette is enlarged. The lungs are clear without focal pneumonia, edema, pneumothorax or pleural effusion. The visualized bony structures of the thorax are intact. Telemetry leads overlie the chest. IMPRESSION: Stable.  No acute cardiopulmonary findings. Electronically Signed   By: Misty Stanley M.D.   On: 12/11/2016 18:38    Scheduled Meds: . allopurinol  300 mg Oral Daily  . amiodarone  200 mg Oral Daily  . aspirin  81 mg Oral Daily  . atorvastatin  20 mg Oral QHS  . cholecalciferol  1,000 Units Oral Daily  . diltiazem  240 mg Oral Daily  . docusate sodium  100 mg Oral BID  . enoxaparin (LOVENOX) injection  40 mg Subcutaneous Daily  . ferrous sulfate  325 mg Oral Q breakfast  . loratadine  10 mg Oral Daily  . methylPREDNISolone (SOLU-MEDROL) injection  60 mg Intravenous Q12H  . metoprolol tartrate  50 mg Oral BID  . pantoprazole  40 mg Oral Daily    Continuous Infusions: . sodium chloride Stopped (12/11/16 1926)  . famotidine (PEPCID) IV Stopped  (12/12/16 0155)  . piperacillin-tazobactam (ZOSYN)  IV 3.375 g (12/12/16 0413)  . vancomycin       Time spent: 35 mins, case discussed with oncology Dr Letta Pate over the phone I have personally reviewed and interpreted on  12/12/2016 daily labs, tele strips, imagings as discussed above under date review session and assessment and plans.  I reviewed all nursing notes, pharmacy notes, consultant notes,  vitals, pertinent old records  I have discussed plan of care as described above with RN , patient and family on 12/12/2016   Florencia Reasons MD, PhD  [facility] Pager [phone]. If 7PM-7AM, please contact night-coverage at  [email or web address], password [facility] 12/12/2016, 7:49 AM  LOS: 1 day

## 2016-12-12 NOTE — Progress Notes (Signed)
The order for simvastatin(Zocor) was changed to an equivalent dose of atorvastatin(Lipitor) due to the potential drug interaction with Cardizem  When taken in combination with medications that inhibit its metabolism, simvastatin can accumulate which increases the risk of liver toxicity, myopathy, or rhabdomyolysis.  Simvastatin dose should not exceed 10mg /day in patients taking verapamil, diltiazem, fibrates, or niacin >or= 1g/day.   Simvastatin dose should not exceed 20mg /day in patients taking amlodipine, ranolazine or amiodarone.   Please consider this potential interaction at discharge.  Dorrene German 12/12/2016 12:49 AM

## 2016-12-12 NOTE — Care Management Note (Signed)
Case Management Note  Patient Details  Name: [name] [name] MRN: [id number] Date of Birth: 1950/12/02  Subjective/Objective:                  Febrile, uti ,ams  Action/Plan: Resident at [facility]  Expected Discharge Date:  12/13/16               Expected Discharge Plan:  [place]  In-House Referral:  Clinical Social Work  Discharge planning Services  CM Consult  Post Acute Care Choice:    Choice offered to:     DME Arranged:    DME Agency:     HH Arranged:    [place] Agency:     Status of Service:  In process, will continue to follow  If discussed at Long Length of Stay Meetings, dates discussed:    Additional Comments:  [name], RN 12/12/2016, 8:45 AM

## 2016-12-12 NOTE — Progress Notes (Signed)
[name] [name]   DOB:1950/05/28   SE#:[id number]   WVP#:[id number]  Subjective:  Feeling a bit weak. Otherwise OK. Had a small BM and "a lot of urine" this AM. No family or friends in room   Objective: middle aged White man examined in bed Vitals:   12/12/16 0500 12/12/16 0700  BP: 115/76 108/70  Pulse: 79 84  Resp: 15 15  Temp:    SpO2: 96% 96%    Body mass index is 27.15 kg/m.  Intake/Output Summary (Last 24 hours) at 12/12/2016 0802 Last data filed at 12/12/2016 5462 Gross per 24 hour  Intake 5257.5 ml  Output 225 ml  Net 5032.5 ml     Sclerae unicteric  Oropharynx shows no thrush or other lesions  Lungs no rales or wheezes--auscultated anterolaterally  Heart regular rate to auscultation this AM  Abdomen soft, +BS  Neuro nonfocal, well-oriented, positive affect    CBG (last 3)  Recent Labs    12/11/16 1818  GLUCAP 144*     Labs:  Lab Results  Component Value Date   WBC 8.0 12/12/2016   HGB 8.1 (L) 12/12/2016   HCT 25.5 (L) 12/12/2016   MCV 90.4 12/12/2016   PLT 87 (L) 12/12/2016   NEUTROABS 3.7 12/11/2016    '@LASTCHEMISTRY'$ @  Urine Studies No results for input(s): UHGB, CRYS in the last 72 hours.  Invalid input(s): UACOL, UAPR, USPG, UPH, UTP, UGL, UKET, UBIL, UNIT, UROB, [place], UEPI, UWBC, [place], [place]  Basic Metabolic Panel: Recent Labs  Lab 12/06/16 0432 12/11/16 0855 12/11/16 1816 12/12/16 0148  NA 134* 140 140 138  K 5.0 4.9 5.1 4.2  CL 103  --  110 112*  CO2 22 21* 18* 19*  GLUCOSE 118* 90 145* 177*  BUN 16 19.7 24* 22*  CREATININE 0.66 0.8 1.02 0.75  CALCIUM 8.3* 8.9 8.1* 7.4*  PHOS 4.5  --   --   --    GFR Estimated Creatinine Clearance: 99.7 mL/min (by C-G formula based on SCr of 0.75 mg/dL). Liver Function Tests: Recent Labs  Lab 12/06/16 0432 12/11/16 0855  AST 37 41*  ALT 17 18  ALKPHOS 78 124  BILITOT 1.3* 2.31*  PROT 5.5* 6.2*  ALBUMIN 2.0* 2.2*   No results for input(s): LIPASE, AMYLASE in the last  168 hours. No results for input(s): AMMONIA in the last 168 hours. Coagulation profile Recent Labs  Lab 12/11/16 2253  INR 1.52    CBC: Recent Labs  Lab 12/06/16 0432 12/11/16 0855 12/11/16 1816 12/12/16 0148  WBC 3.8* 6.8 4.7 8.0  NEUTROABS  --  4.4 3.7  --   HGB 9.3* 10.0* 8.9* 8.1*  HCT 29.8* 31.2* 29.4* 25.5*  MCV 89.2 88.2 92.5 90.4  PLT 195 178 111* 87*   Cardiac Enzymes: Recent Labs  Lab 12/11/16 2253 12/12/16 0148  TROPONINI 0.06* 0.05*   BNP: Invalid input(s): POCBNP CBG: Recent Labs  Lab 12/11/16 1818  GLUCAP 144*   D-Dimer No results for input(s): DDIMER in the last 72 hours. Hgb A1c No results for input(s): HGBA1C in the last 72 hours. Lipid Profile Recent Labs    12/12/16 0148  CHOL 99  HDL 16*  LDLCALC 70  TRIG 67  CHOLHDL 6.2   Thyroid function studies Recent Labs    12/12/16 0148  TSH 1.881   Anemia work up No results for input(s): VITAMINB12, FOLATE, FERRITIN, TIBC, IRON, RETICCTPCT in the last 72 hours. Microbiology Recent Results (from the past 240 hour(s))  TECHNOLOGIST REVIEW     Status: None   Collection Time: 12/11/16  8:55 AM  Result Value Ref Range Status   Technologist Review   Final    Large Variant lymphs present- few with nucleoli, rare myelocyte seen  MRSA PCR Screening     Status: Abnormal   Collection Time: 12/12/16 12:54 AM  Result Value Ref Range Status   MRSA by PCR POSITIVE (A) NEGATIVE Final    Comment:        The GeneXpert MRSA Assay (FDA approved for NASAL specimens only), is one component of a comprehensive MRSA colonization surveillance program. It is not intended to diagnose MRSA infection nor to guide or monitor treatment for MRSA infections. RESULT CALLED TO, READ BACK BY AND VERIFIED WITHVira Blanco RN 5625 12/12/16 A NAVARRO       Studies:  Dg Chest Portable 1 View  Result Date: 12/11/2016 CLINICAL DATA:  Shortness of breath EXAM: PORTABLE CHEST 1 VIEW COMPARISON:  11/30/2016  FINDINGS: 1756 hours. The cardio pericardial silhouette is enlarged. The lungs are clear without focal pneumonia, edema, pneumothorax or pleural effusion. The visualized bony structures of the thorax are intact. Telemetry leads overlie the chest. IMPRESSION: Stable.  No acute cardiopulmonary findings. Electronically Signed   By: Misty Stanley M.D.   On: 12/11/2016 18:38    Assessment: 66 y.o. 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  The Afib remains an active issue and may limit our ability to push the rituximab. Nevertheless I am planning on a second try 12/7 in our office.  If his counts continue to drop over weekend will do a second 5-day course of steroids-- we will follow with you.  Appreciate your help to this patient!   Chauncey Cruel, MD 12/12/2016  8:02 AM Medical Oncology and Hematology [facility] [address] [place],  [address] Tel. [phone]    Fax. [phone]

## 2016-12-12 NOTE — Progress Notes (Signed)
Pharmacy Antibiotic Note  [name] [name] is a 66 y.o. male with SOB admitted on 12/11/2016 with sepsis.  Pharmacy has been consulted for zosyn and vancomycin dosing.  Plan: Zosyn 3.375g IV q8h (4 hour infusion).  Vancomycin 1 Gm IV q12h for est AUC=514 Goal AUC=400-500 Daily Scr F/u cultures/levels   Height: 6' (182.9 cm) Weight: 192 lb (87.1 kg) IBW/kg (Calculated) : 77.6  Temp (24hrs), Avg:99.1 F (37.3 C), Min:98.1 F (36.7 C), Max:102.3 F (39.1 C)  Recent Labs  Lab 12/05/16 0346 12/06/16 0432 12/11/16 0855 12/11/16 1816 12/11/16 2047 12/11/16 2253  WBC 4.5 3.8* 6.8 4.7  --   --   CREATININE 0.72 0.66 0.8 1.02  --   --   LATICACIDVEN  --   --   --   --  4.17* 2.3*    Estimated Creatinine Clearance: 78.2 mL/min (by C-G formula based on SCr of 1.02 mg/dL).    No Known Allergies  Antimicrobials this admission: 11/28 zosyn >>  11/28 vancomycin >>   Dose adjustments this admission:   Microbiology results:  BCx:   UCx:    Sputum:    MRSA PCR:   Thank you for allowing pharmacy to be a part of this patient's care.  Dorrene German 12/12/2016 12:03 AM

## 2016-12-12 NOTE — Progress Notes (Signed)
[facility] Progress Note   [name] [name] [id number] August 20, 1950 66 y.o.  [name] [name] is managed by Dr. Jana Hakim  Actively treated with chemotherapy: yes  Current Therapy: Rituximab  Last Treated: 12/11/2016  Assessment: Plan:    Chemotherapy adverse reaction, initial encounter  [name] [name] was seen in the infusion room for a suspected chemotherapy reaction. He was receiving rituximab at the time of his reaction. He had received a total of 40 ml prior to onset of symptoms. His symptoms included: Rigors and hypotension. Rituximab was paused and [name] was given Pepcid 20 mg IV and Solu-Medrol 125 mg IV after onset of his symptoms. [name] did  respond to intervention.  Rituximab was restarted.    The patient tolerated a restart of therapy at a lower dose with titration of his dose for around 2-1/2 hours.  [name] was seen in the infusion room for a second chemotherapy reaction at around 515. He was receiving rituximab at the time of his reaction. He had received around 2-1/2 hours of rituximab after a restart after having rigors earlier.  His symptoms included: Rigors, vomiting, hypotension, and hypoxia. Rituximab was stopped and [name] was given supplemental O2 via mask, Pepcid 20 mg IV, Solu-Medrol 125 mg IV, Benadryl 25 mg IV, and Demerol 20 mg IV after onset of his symptoms. [name] did not respond to intervention.  Mr. Lesinski was transported emergently to the ER for management.  Please see After Visit Summary for patient specific instructions.  Future Appointments  Date Time Provider [place]  12/25/2016 11:45 AM [facility] LAB 3 [facility] None  12/25/2016 12:15 PM Magrinat, [name], MD [facility] None  12/25/2016  1:15 PM [facility] C9 [facility] None  12/16/2016 12:00 PM [facility] LAB 4 [facility] None  12/29/2016 12:30 PM Magrinat, [name], MD [facility] None  12/13/2016  1:30 PM [facility] I25  DNS [facility] None  01/08/2017 12:30 PM [facility] ONLY [facility] None  01/08/2017  1:00 PM Causey, Charlestine Massed, NP [facility] None  01/08/2017  2:00 PM [facility] C8 [facility] None    No orders of the defined types were placed in this encounter.      Subjective:   Patient ID:  [name] [name] is a 65 y.o. (DOB [birth year]) male.  Chief Complaint: No chief complaint on file.   HPI [name] [name] was seen in the infusion room for a suspected chemotherapy reaction. He was receiving rituximab at the time of his reaction. He had received a total of 40 ml prior to onset of symptoms. His symptoms included: Rigors and hypotension. Rituximab was paused and [name] was given Pepcid 20 mg IV and Solu-Medrol 125 mg IV after onset of his symptoms. [name] did  respond to intervention.  Rituximab was restarted.  The patient tolerated a restart of therapy at a lower dose with titration of his dose for around 2-1/2 hours. [name] [name] was seen in the infusion room for a second chemotherapy reaction at around 515. He was receiving rituximab at the time of his reaction. He had received around 2-1/2 hours of rituximab after a restart after having rigors earlier. His symptoms included: Rigors, vomiting, hypotension, and hypoxia. Rituximab was stopped and [name] was given supplemental O2 via mask, Pepcid 20 mg IV, Solu-Medrol 125 mg IV, Benadryl 25 mg IV, and Demerol 20 mg IV after onset of his symptoms. [name] did not respond to  intervention.  [name] was transported emergently to the ER for management.  Medications: I have reviewed the patient's current medications.  Allergies: No Known Allergies  Past Medical History:  Diagnosis Date  . Bladder tumor   . Cataract   . CVA (cerebral infarction)    2008  . Hyperlipidemia   . Hypertension     Past Surgical History:  Procedure Laterality Date  . CHOLECYSTECTOMY    . EYE SURGERY    . FLEXIBLE SIGMOIDOSCOPY N/A  11/30/2013   Procedure: FLEXIBLE SIGMOIDOSCOPY;  Surgeon: Inda Castle, MD;  Location: [facility];  Service: Endoscopy;  Laterality: N/A;  . HOT HEMOSTASIS N/A 11/30/2013   Procedure: HOT HEMOSTASIS (ARGON PLASMA COAGULATION/BICAP);  Surgeon: Inda Castle, MD;  Location: [facility];  Service: Endoscopy;  Laterality: N/A;  . MOUTH SURGERY      Family History  Problem Relation Age of Onset  . Colon cancer Father   . Colon cancer Sister 29  . Hypertension Sister     Social History   Socioeconomic History  . Marital status: Single    Spouse name: Not on file  . Number of children: Not on file  . Years of education: Not on file  . Highest education level: Not on file  Social Needs  . Financial resource strain: Not on file  . Food insecurity - worry: Not on file  . Food insecurity - inability: Not on file  . Transportation needs - medical: Not on file  . Transportation needs - non-medical: Not on file  Occupational History  . Occupation: disabled  Tobacco Use  . Smoking status: Former Smoker    Last attempt to quit: 09/15/1985    Years since quitting: 31.2  . Smokeless tobacco: Never Used  . Tobacco comment: Started at age 69, quit in the 1980's  Substance and Sexual Activity  . Alcohol use: No  . Drug use: No  . Sexual activity: Not on file  Other Topics Concern  . Not on file  Social History Narrative  . Not on file    Past Medical History, Surgical history, Social history, and Family history were reviewed and updated as appropriate.   Please see review of systems for further details on the patient's review from today.   Review of Systems:  Review of Systems  Constitutional: Positive for chills. Negative for diaphoresis and fever.  HENT: Negative for trouble swallowing.   Respiratory: Negative for choking, chest tightness and shortness of breath.   Cardiovascular: Positive for palpitations. Negative for chest pain.    Objective:   Physical Exam:  There  were no vitals taken for this visit. ECOG: 1  Physical Exam  Constitutional: He appears lethargic. He appears distressed.  HENT:  Head: Normocephalic.  Cardiovascular: S1 normal and S2 normal. An irregularly irregular rhythm present.  Pulmonary/Chest: He is in respiratory distress. He has no wheezes. He has no rales.  Neurological: He appears lethargic.  Skin: Skin is warm and dry. He is not diaphoretic.    Lab Review:     Component Value Date/Time   NA 138 12/12/2016 0148   NA 140 12/11/2016 0855   K 4.2 12/12/2016 0148   K 4.9 12/11/2016 0855   CL 112 (H) 12/12/2016 0148   CO2 19 (L) 12/12/2016 0148   CO2 21 (L) 12/11/2016 0855   GLUCOSE 177 (H) 12/12/2016 0148   GLUCOSE 90 12/11/2016 0855   BUN 22 (H) 12/12/2016 0148   BUN 19.7 12/11/2016 0855  CREATININE 0.75 12/12/2016 0148   CREATININE 0.8 12/11/2016 0855   CALCIUM 7.4 (L) 12/12/2016 0148   CALCIUM 8.9 12/11/2016 0855   PROT 6.2 (L) 12/11/2016 0855   ALBUMIN 2.2 (L) 12/11/2016 0855   AST 41 (H) 12/11/2016 0855   ALT 18 12/11/2016 0855   ALKPHOS 124 12/11/2016 0855   BILITOT 2.31 (H) 12/11/2016 0855   GFRNONAA >60 12/12/2016 0148   GFRAA >60 12/12/2016 0148       Component Value Date/Time   WBC 8.0 12/12/2016 0148   RBC 2.82 (L) 12/12/2016 0148   HGB 8.1 (L) 12/12/2016 0148   HGB 10.0 (L) 12/11/2016 0855   HCT 25.5 (L) 12/12/2016 0148   HCT 31.2 (L) 12/11/2016 0855   PLT 87 (L) 12/12/2016 0148   PLT 178 12/11/2016 0855   MCV 90.4 12/12/2016 0148   MCV 88.2 12/11/2016 0855   MCH 28.7 12/12/2016 0148   MCHC 31.8 12/12/2016 0148   RDW 17.7 (H) 12/12/2016 0148   RDW 17.9 (H) 12/11/2016 0855   LYMPHSABS 0.8 12/11/2016 1816   LYMPHSABS 1.3 12/11/2016 0855   MONOABS 0.2 12/11/2016 1816   MONOABS 1.0 (H) 12/11/2016 0855   EOSABS 0.0 12/11/2016 1816   EOSABS 0.0 12/11/2016 0855   BASOSABS 0.0 12/11/2016 1816   BASOSABS 0.0 12/11/2016 0855   -------------------------------  Imaging from last 24 hours  (if applicable):  Radiology interpretation: Dg Chest 2 View  Result Date: 11/30/2016 CLINICAL DATA:  Fever today. EXAM: CHEST  2 VIEW COMPARISON:  CT chest 11/27/2016.  Chest radiograph 11/27/2016. FINDINGS: Mild cardiac enlargement. No vascular congestion. No airspace disease or consolidation in the lungs. No blunting of costophrenic angles. No pneumothorax. Calcification of the aorta. Prominent lymphadenopathy demonstrated at CT is not well visualized radiographically. IMPRESSION: Cardiac enlargement.  No evidence of active pulmonary disease. Electronically Signed   By: Lucienne Capers M.D.   On: 11/30/2016 23:36   Ct Soft Tissue Neck Wo Contrast  Result Date: 11/21/2016 CLINICAL DATA:  Initial evaluation for right-sided neck swelling. EXAM: CT NECK WITHOUT CONTRAST TECHNIQUE: Multidetector CT imaging of the neck was performed following the standard protocol without intravenous contrast. COMPARISON:  None available. FINDINGS: Pharynx and larynx: Oral cavity within normal limits without mass lesion or loculated fluid collection. Patient is edentulous. Palatine tonsils symmetric and within normal limits bilaterally. Few small calcified tonsilliths noted. Parapharyngeal fat preserved. Nasopharynx within normal limits. Retropharyngeal soft tissues demonstrate no acute abnormality. Epiglottis normal. Vallecula clear. Small amount of layering secretions present within the right hypopharynx, extending into the right piriform sinus. Remainder of the hypopharynx and supraglottic larynx grossly normal. True cords symmetric and within normal limits. Subglottic airway clear. Salivary glands: Salivary glands including the parotid and submandibular glands are within normal limits bilaterally. Thyroid: Thyroid within normal limits. Lymph nodes: Extensive bulky adenopathy seen throughout the neck and visualized upper chest, slightly worse on the right as compared to the left. Nodes are seen at essentially all levels,  most prevalent within the right supraclavicular and axillary region. For reference purposes, largest discrete node within the right supraclavicular region measures approximately 2.6 x 5.1 cm (series 2, image 85). Largest discrete nodal mass within the right axillary region measures approximately 9.4 x 3.7 cm (series 2, image 114). In the left neck, largest discrete node seen at left level III and measures 1.8 x 2.7 cm (series 2, image 71). Largest node within the left upper chest seen within the left subpectoral region and measures 5.4 x 2.9 cm (series  2, image 106). Study nodes seen within the partially visualized upper mediastinum as well, largest of which measures 1 cm in the prevascular region. Findings highly concerning for lymphoproliferative disorder/ lymphoma. Vascular: Atherosclerotic changes noted about the aortic arch and carotid bifurcations. Limited intracranial: Unremarkable. Visualized orbits: Partially visualized globes and orbital soft tissues within normal limits. Mastoids and visualized paranasal sinuses: Chronic maxillary sinusitis noted, right worse than left. Retained metallic density at the posterior right maxillary sinus. Sequelae of prior ORIF at the anterior maxillary sinuses/alveolar ridge. Visualize mastoids and middle ear cavities are clear. Skeleton: No acute osseus abnormality. No worrisome lytic or blastic osseous lesions. Straightening of the normal cervical lordosis with trace retrolisthesis of C3 on C4. Moderate to advanced degenerate spondylolysis at C5-6. Upper chest: Partially visualized lungs are clear. Other: None. IMPRESSION: 1. Extensive bulky adenopathy throughout the neck and visualized upper chest, right worse than left, highly concerning for lymphoproliferative disorder/lymphoma. 2. No other acute abnormality within the neck. 3. Chronic maxillary sinusitis. Electronically Signed   By: Jeannine Boga M.D.   On: 11/21/2016 15:27   Ct Angio Chest Pe W And/or Wo  Contrast  Result Date: 11/27/2016 CLINICAL DATA:  Tachycardia. Suspect pulmonary embolism. History of hypertension, ex smoker, bladder tumor. Scheduled for neck biopsy today. EXAM: CT ANGIOGRAPHY CHEST WITH CONTRAST TECHNIQUE: Multidetector CT imaging of the chest was performed using the standard protocol during bolus administration of intravenous contrast. Multiplanar CT image reconstructions and MIPs were obtained to evaluate the vascular anatomy. CONTRAST:  111m ISOVUE-370 IOPAMIDOL (ISOVUE-370) INJECTION 76% COMPARISON:  CT neck November 21, 2016 and chest radiograph November 27, 2016 and CT abdomen and pelvis June 22, 2012 FINDINGS: CARDIOVASCULAR: Adequate contrast opacification of the pulmonary artery's. Main pulmonary artery is not enlarged. No pulmonary arterial filling defects to the level of the segmental branches, distal branches obscured by respiratory motion. Heart is upper limits of normal in size. Mild coronary artery calcifications. Small pericardial effusion. Thoracic aorta is normal course and caliber, unremarkable. MEDIASTINUM/NODES: Greater than expected number of mildly enlarged mediastinal and hilar lymph nodes. Anterior mediastinal fat stranding. Bulky RIGHT greater than LEFT axillary lymphadenopathy, 12.9 x 6.7 cm RIGHT axillary nodal conglomeration. Bulky RIGHT grand LEFT supraclavicular lymphadenopathy. LUNGS/PLEURA: Tracheobronchial tree is patent, no pneumothorax. No pleural effusions, focal consolidations, pulmonary nodules or masses. UPPER ABDOMEN: New splenomegaly. 6.7 cm cyst (5 Hounsfield units) LEFT renal fossa, larger than prior CT. Stable 3 cm cyst (3 Hounsfield units) RIGHT lobe of the liver. Partially imaged probable portal caval lymphadenopathy. MUSCULOSKELETAL: Nonacute. RIGHT biceps intramuscular lipoma versus atrophy. Multilevel scattered Schmorl's nodes. Review of the MIP images confirms the above findings. IMPRESSION: 1. No acute pulmonary embolism. 2. Bulky  supraclavicular, axillary and possibly portal caval lymphadenopathy. 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 3. Borderline cardiomegaly.  No acute pulmonary process. Aortic Atherosclerosis (ICD10-I70.0). Electronically Signed   By: CElon AlasM.D.   On: 11/27/2016 17:13   Dg Chest Portable 1 View  Result Date: 12/11/2016 CLINICAL DATA:  Shortness of breath EXAM: PORTABLE CHEST 1 VIEW COMPARISON:  11/30/2016 FINDINGS: 1756 hours. The cardio pericardial silhouette is enlarged. The lungs are clear without focal pneumonia, edema, pneumothorax or pleural effusion. The visualized bony structures of the thorax are intact. Telemetry leads overlie the chest. IMPRESSION: Stable.  No acute cardiopulmonary findings. Electronically Signed   By: EMisty StanleyM.D.   On: 12/11/2016 18:38   Dg Chest Port 1 View  Result Date: 11/27/2016 CLINICAL DATA:  Atrial fibrillation EXAM:  PORTABLE CHEST 1 VIEW COMPARISON:  None. FINDINGS: Cardiac silhouette is upper limits of normal. Mild aortic atherosclerotic calcification. No focal airspace consolidation or pulmonary edema. No pneumothorax or sizable pleural effusion. IMPRESSION: No active disease. Aortic Atherosclerosis (ICD10-I70.0). Electronically Signed   By: Ulyses Jarred M.D.   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 EXAM: CT GUIDED BONE MARROW ASPIRATES AND BIOPSY Physician: Stephan Minister. Anselm Pancoast, MD MEDICATIONS: None. ANESTHESIA/SEDATION: Fentanyl 50 mcg IV; Versed 1.0 mg IV Moderate Sedation Time:  12 minutes The patient was continuously monitored during the procedure by the interventional radiology nurse under my direct supervision. COMPLICATIONS: None immediate. PROCEDURE: The procedure was explained to the patient. The risks and benefits of the procedure were discussed and the patient's questions were addressed. Informed consent was obtained  from the patient. The patient was placed prone on CT scan. Images of the pelvis were obtained. The right side of back was prepped and draped in sterile fashion. The skin and right posterior iliac bone were anesthetized with 1% lidocaine. 11 gauge bone needle was directed into the right iliac bone with CT guidance. Two aspirates and one core biopsy obtained. Bandage placed over the puncture site. FINDINGS: Bone needle directed in the right ilium. Extensive lymphadenopathy throughout the pelvis and inguinal regions. IMPRESSION: CT guided bone marrow aspirates and core biopsy. Electronically Signed   By: Markus Daft M.D.   On: 12/05/2016 10:19   [place] Core Biopsy (lymph Nodes)  Result Date: 11/28/2016 INDICATION: Bulky cervical axillary lymphadenopathy. Please perform ultrasound-guided biopsy for tissue diagnostic purposes. EXAM: ULTRASOUND-GUIDED RIGHT CERVICAL LYMPH NODE BIOPSY COMPARISON:  Neck CT - 11/21/2016; chest CT - 11/17/2016 MEDICATIONS: None ANESTHESIA/SEDATION: Moderate (conscious) sedation was employed during this procedure. A total of Versed 2 mg and Fentanyl 75 mcg was administered intravenously. Moderate Sedation Time: 10 minutes. The patient's level of consciousness and vital signs were monitored continuously by radiology nursing throughout the procedure under my direct supervision. COMPLICATIONS: None immediate. TECHNIQUE: Informed written consent was obtained from the patient after a discussion of the risks, benefits and alternatives to treatment. Questions regarding the procedure were encouraged and answered. Initial ultrasound scanning demonstrated multiple enlarged cervical lymph nodes. A dominant approximately 3.2 x 1.6 cm lymph node within the superficial aspect of the mid right lateral neck was targeted for biopsy given lymph node location and sonographic window. An ultrasound image was saved for documentation purposes. The procedure was planned. A timeout was performed prior to the  initiation of the procedure. The operative was prepped and draped in the usual sterile fashion, and a sterile drape was applied covering the operative field. A timeout was performed prior to the initiation of the procedure. Local anesthesia was provided with 1% lidocaine with epinephrine. Under direct ultrasound guidance, an 18 gauge core needle device was utilized to obtain to obtain 6 core needle biopsies of the dominant right cervical lymph node. The samples were placed in saline and submitted to pathology. The needle was removed and hemostasis was achieved with manual compression. Post procedure scan was negative for significant hematoma. A dressing was placed. The patient tolerated the procedure well without immediate postprocedural complication. IMPRESSION: Technically successful ultrasound guided biopsy of dominant right cervical lymph node. Electronically Signed   By: Sandi Mariscal M.D.   On: 11/28/2016 13:48   US Soft Tissue Neck  Result Date: 12/01/2016 CLINICAL DATA:  66 year old male with a history of neck mass, prior biopsy, possible  infection EXAM: ULTRASOUND OF HEAD/NECK SOFT TISSUES TECHNIQUE: Ultrasound examination of the head and neck soft tissues was performed in the area of clinical concern. COMPARISON:  Ultrasound 11/28/2016, prior CT 11/27/2016 FINDINGS: Grayscale and color duplex ultrasound formed in the region of clinical concern. Multiple enlarged lymph nodes again evident, as previously demonstrated on ultrasound and CT imaging. No focal fluid collection. Post biopsy changes of targeted lymph node are evident without fluid collection, gas, or significant edema. IMPRESSION: Ultrasound survey in the region of clinical concern demonstrates no evidence of focal fluid collection or gas. Expected changes of prior biopsy of the targeted enlarged cervical lymph nodes. Electronically Signed   By: Corrie Mckusick D.O.   On: 12/01/2016 12:16        This case was discussed with Dr. Jana Hakim. He  expressed his agreement with my management of this patient.

## 2016-12-13 ENCOUNTER — Inpatient Hospital Stay (HOSPITAL_COMMUNITY): Payer: Medicare Other

## 2016-12-13 DIAGNOSIS — T451X5A Adverse effect of antineoplastic and immunosuppressive drugs, initial encounter: Secondary | ICD-10-CM

## 2016-12-13 DIAGNOSIS — D696 Thrombocytopenia, unspecified: Secondary | ICD-10-CM

## 2016-12-13 DIAGNOSIS — D630 Anemia in neoplastic disease: Secondary | ICD-10-CM

## 2016-12-13 DIAGNOSIS — N5089 Other specified disorders of the male genital organs: Secondary | ICD-10-CM

## 2016-12-13 LAB — COMPREHENSIVE METABOLIC PANEL
ALT: 15 U/L — ABNORMAL LOW (ref 17–63)
AST: 43 U/L — ABNORMAL HIGH (ref 15–41)
Albumin: 2 g/dL — ABNORMAL LOW (ref 3.5–5.0)
Alkaline Phosphatase: 85 U/L (ref 38–126)
Anion gap: 6 (ref 5–15)
BUN: 23 mg/dL — ABNORMAL HIGH (ref 6–20)
CO2: 21 mmol/L — ABNORMAL LOW (ref 22–32)
Calcium: 7.8 mg/dL — ABNORMAL LOW (ref 8.9–10.3)
Chloride: 112 mmol/L — ABNORMAL HIGH (ref 101–111)
Creatinine, Ser: 0.74 mg/dL (ref 0.61–1.24)
GFR calc Af Amer: >60 mL/min (ref >60)
GFR calc non Af Amer: >60 mL/min (ref >60)
Glucose, Bld: 126 mg/dL — ABNORMAL HIGH (ref 65–99)
Potassium: 3.9 mmol/L (ref 3.5–5.1)
Sodium: 139 mmol/L (ref 135–145)
Total Bilirubin: 1.6 mg/dL — ABNORMAL HIGH (ref 0.3–1.2)
Total Protein: 5.2 g/dL — ABNORMAL LOW (ref 6.5–8.1)

## 2016-12-13 LAB — CBC WITH DIFFERENTIAL/PLATELET
BASOS ABS: 0.2 10*3/uL — AB (ref 0.0–0.1)
BASOS PCT: 2 %
EOS PCT: 0 %
Eosinophils Absolute: 0 10*3/uL (ref 0.0–0.7)
HCT: 26.6 % — ABNORMAL LOW (ref 39.0–52.0)
Hemoglobin: 8.5 g/dL — ABNORMAL LOW (ref 13.0–17.0)
Lymphocytes Relative: 8 %
Lymphs Abs: 0.6 10*3/uL — ABNORMAL LOW (ref 0.7–4.0)
MCH: 28.6 pg (ref 26.0–34.0)
MCHC: 32 g/dL (ref 30.0–36.0)
MCV: 89.6 fL (ref 78.0–100.0)
MONO ABS: 0.6 10*3/uL (ref 0.1–1.0)
Monocytes Relative: 8 %
NEUTROS ABS: 6.1 10*3/uL (ref 1.7–7.7)
Neutrophils Relative %: 82 %
PLATELETS: 101 10*3/uL — AB (ref 150–400)
RBC: 2.97 MIL/uL — ABNORMAL LOW (ref 4.22–5.81)
RDW: 17.9 % — AB (ref 11.5–15.5)
WBC: 7.4 10*3/uL (ref 4.0–10.5)

## 2016-12-13 LAB — LACTIC ACID, PLASMA: Lactic Acid, Venous: 1.9 mmol/L (ref 0.5–1.9)

## 2016-12-13 LAB — URIC ACID: Uric Acid, Serum: 2 mg/dL — ABNORMAL LOW (ref 4.4–7.6)

## 2016-12-13 LAB — LACTATE DEHYDROGENASE: LDH: 226 U/L — ABNORMAL HIGH (ref 98–192)

## 2016-12-13 LAB — MAGNESIUM: Magnesium: 2.1 mg/dL (ref 1.7–2.4)

## 2016-12-13 MED ORDER — FAMOTIDINE 20 MG PO TABS
20.0000 mg | ORAL_TABLET | Freq: Two times a day (BID) | ORAL | Status: DC
  Administered 2016-12-13 – 2016-12-14 (×3): 20 mg via ORAL
  Filled 2016-12-13 (×3): qty 1

## 2016-12-13 NOTE — Clinical Social Work Note (Signed)
Clinical Social Work Assessment  Patient Details  Name: [name] [name] MRN: [id number] Date of Birth: 05-27-1950  Date of referral:  12/13/16               Reason for consult:  Facility Placement, Discharge Planning                Permission sought to share information with:  Facility Art therapist granted to share information::  Yes, Verbal Permission Granted  Name::        Agency::  [facility]  Relationship::     Contact Information:     Housing/Transportation Living arrangements for the past 2 months:  [name] [name] of Information:  Patient Patient Interpreter Needed:  None Criminal Activity/Legal Involvement Pertinent to Current Situation/Hospitalization:  No - Comment as needed Significant Relationships:  Friend Lives with:  Facility Resident Do you feel safe going back to the place where you live?  Yes Need for family participation in patient care:  No (Coment)  Care giving concerns:  Patient from [facility] for [name] term care.   Social Worker assessment / plan:  CSW spoke with patient at bedside regarding discharge planning. Patient verbalized plan to return to [name] at dc. Patient reported that he has been there since 2008 after having a stroke.   CSW will complete FL2 and continue to follow and assist with discharge planning.  Employment status:  Disabled (Comment on whether or not currently receiving Disability) Insurance information:  Medicare, Medicaid [employer] PT Recommendations:  Not assessed at this time Information / Referral to community resources:  (PAtient from SNF)  Patient/Family's Response to care:  Patient appreciative of CSW assistance with discharge planning. Patient reported that he is feeling somewhat better.  Patient/Family's Understanding of and Emotional Response to Diagnosis, Current Treatment, and Prognosis:  Patient presented calm and verbalized plan to dc back to SNF. CSW inquired about  patient's support system, patient reported he has has friends and that he is the only survivor of his family. CSW provided active listening and positively affirmed patient having friends as a support system.    Emotional Assessment Appearance:  Appears stated age Attitude/Demeanor/Rapport:  Other(cooperative) Affect (typically observed):  Calm Orientation:  Oriented to Self, Oriented to Place, Oriented to Situation Alcohol / Substance use:  Not Applicable Psych involvement (Current and /or in the community):  No (Comment)  Discharge Needs  Concerns to be addressed:  Care Coordination Readmission within the last 30 days:  Yes Current discharge risk:  None Barriers to Discharge:  Continued Medical Work up   [employer], LCSW 12/13/2016, 2:58 PM

## 2016-12-13 NOTE — Progress Notes (Signed)
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 -will continue zosyn for now, consider change to augmentin tomorrow  Anemia/thrombocytopenia -No sign of acute bleeding, FOBT test was negative on 11/15, may be intermittent blood loss from GI? -Patient was evaluated by GI Dr Penelope Coop on 11/21, patient declined egd/colonosopy at that time. -Per oncology Dr [name] [name], change steroid to prednisone 60mg  daily for total of 5 days (counts improved in the past) -hgb stable, plt improving  Atrial fibrillation with RVR ([place]): likely triggered by sepsis. CHA2DS2-VASc Score is 2, needs oral anticoagulation, but patient is not on AC due to anemia/thrombocytopenia and mental cell lymphoma He was last evaluated by cardiology on 11/17 -rate controlled today, continue aspirin, Cardizem, amiodarone, Lopressor   Mantle cell lymphoma [facility]): f/u by dr. Jana [name]. He did not tolerate firsrt dose of Rituxan on 11/29.  Right arm large cystic structure, nontender, will get ct humerus  Scrotal edema: nontender, scrotal  US    Code Status: full  Family Communication: patient , only contact listed is a friend, I am not able to reach his friend by phone.  Disposition Plan: move to med tele on 12/1, return to snf when culture finalized and counts improves Hopefully on 12/2  Consultants:  oncology  Procedures:  none  Antibiotics:  vanc from admission to 11/30  Zosyn from admission   Objective: BP (!) 104/55   Pulse (!) 102   Temp 97.7 F (36.5 C) (Oral)   Resp 19   Ht 6' (1.829 m)   Wt 90.5 kg (199 lb 8.3 oz)   SpO2 95%   BMI 27.06 kg/m   Intake/Output Summary (Last 24 hours) at 12/13/2016 3614 Last data filed at 12/13/2016 4315 Gross per 24 hour  Intake 1077.5 ml  Output 800 ml  Net 277.5 ml   Filed Weights   12/11/16 2141 12/12/16 0052 12/13/16 0500  Weight: 87.1 kg (192 lb) 90.8 kg (200 lb 2.8 oz) 90.5 kg (199 lb 8.3 oz)    Exam: Patient is examined daily including today on 12/13/2016, exams remain the same as of yesterday except that has changed    General:  NAD, very poor historian (h/o mild Mental Retardation)  Cardiovascular: IRRR  Respiratory: CTABL  Abdomen: Soft/ND/NT, positive BS, scrotal edema asymmetric, left> right, nontender  Musculoskeletal: right arm large cystic structure, no lower extremity edema  Neuro: alert, interactive  Data Reviewed: Basic Metabolic Panel: Recent Labs  Lab 12/11/16 0855 12/11/16 1816 12/12/16 0148 12/13/16 0318  NA 140 140 138 139  K 4.9 5.1 4.2 3.9  CL  --  110 112*  112*  CO2 21* 18* 19* 21*  GLUCOSE 90 145* 177* 126*  BUN 19.7 24* 22* 23*  CREATININE 0.8 1.02 0.75 0.74  CALCIUM 8.9 8.1* 7.4* 7.8*  MG  --   --   --  2.1   Liver Function Tests: Recent Labs  Lab 12/11/16 0855 12/13/16 0318  AST 41* 43*  ALT 18 15*  ALKPHOS 124 85  BILITOT 2.31* 1.6*  PROT 6.2* 5.2*  ALBUMIN 2.2* 2.0*   No results for input(s): LIPASE, AMYLASE in the last 168 hours. No results for input(s): AMMONIA in the last 168  hours. CBC: Recent Labs  Lab 12/11/16 0855 12/11/16 1816 12/12/16 0148 12/13/16 0318  WBC 6.8 4.7 8.0 7.4  NEUTROABS 4.4 3.7  --  6.1  HGB 10.0* 8.9* 8.1* 8.5*  HCT 31.2* 29.4* 25.5* 26.6*  MCV 88.2 92.5 90.4 89.6  PLT 178 111* 87* 101*   Cardiac Enzymes:   Recent Labs  Lab 12/11/16 2253 12/12/16 0148 12/12/16 1005  TROPONINI 0.06* 0.05* <0.03   BNP (last 3 results) No results for input(s): BNP in the last 8760 hours.  ProBNP (last 3 results) No results for input(s): PROBNP in the last 8760 hours.  CBG: Recent Labs  Lab 12/11/16 1818  GLUCAP 144*    Recent Results (from the past 240 hour(s))  TECHNOLOGIST REVIEW     Status: None   Collection Time: 12/11/16  8:55 AM  Result Value Ref Range Status   Technologist Review   Final    Large Variant lymphs present- few with nucleoli, rare myelocyte seen  Blood Culture (routine x 2)     Status: None (Preliminary result)   Collection Time: 12/11/16  8:12 PM  Result Value Ref Range Status   Specimen Description BLOOD LEFT ANTECUBITAL  Final   Special Requests   Final    BOTTLES DRAWN AEROBIC AND ANAEROBIC Blood Culture adequate volume   Culture   Final    NO GROWTH < 24 HOURS Performed at [facility], [address], [place], [place] [address]    Report Status PENDING  Incomplete  Blood Culture (routine x 2)     Status: None (Preliminary result)   Collection Time: 12/11/16  8:17 PM  Result Value Ref Range Status   Specimen Description BLOOD LEFT WRIST  Final   Special Requests   Final    BOTTLES DRAWN AEROBIC AND ANAEROBIC Blood Culture adequate volume   Culture   Final    NO GROWTH < 24 HOURS Performed at [facility], [place] [address], [place], [place] [address]    Report Status PENDING  Incomplete  MRSA PCR Screening     Status: Abnormal   Collection Time: 12/12/16 12:54 AM  Result Value Ref Range Status   MRSA by PCR POSITIVE (A) NEGATIVE Final    Comment:        The GeneXpert MRSA Assay  (FDA approved for NASAL specimens only), is one component of a comprehensive MRSA colonization surveillance program. It is not intended to diagnose MRSA infection nor to guide or monitor treatment for MRSA infections. RESULT CALLED TO, READ BACK BY AND VERIFIED WITHVira Blanco RN [id number] 12/12/16 A NAVARRO      Studies: No results found.  Scheduled Meds: . allopurinol  300 mg Oral Daily  . amiodarone  200 mg Oral Daily  . aspirin  81 mg Oral Daily  . atorvastatin  20 mg Oral QHS  . Chlorhexidine Gluconate Cloth  6  each Topical [id number]  . cholecalciferol  1,000 Units Oral Daily  . diltiazem  240 mg Oral Daily  . docusate sodium  100 mg Oral BID  . ferrous sulfate  325 mg Oral Q breakfast  . folic acid  1 mg Oral Daily  . loratadine  10 mg Oral Daily  . metoprolol tartrate  50 mg Oral BID  . mupirocin ointment  1 application Nasal BID  . pantoprazole  40 mg Oral Daily  . predniSONE  60 mg Oral Q breakfast    Continuous Infusions: . famotidine (PEPCID) IV Stopped (12/12/16 2214)  . piperacillin-tazobactam (ZOSYN)  IV 3.375 g (12/13/16 6184)     Time spent: 35 mins,  I have personally reviewed and interpreted on  12/13/2016 daily labs, tele strips, imagings as discussed above under date review session and assessment and plans.  I reviewed all nursing notes, pharmacy notes, consultant notes,  vitals, pertinent old records  I have discussed plan of care as described above with RN , patient  on 12/13/2016   Florencia Reasons MD, PhD  [facility] Pager [phone]. If 7PM-7AM, please contact night-coverage at [email or web address], password [facility] 12/13/2016, 9:07 AM  LOS: 2 days

## 2016-12-13 NOTE — NC FL2 (Signed)
[place] LEVEL OF CARE SCREENING TOOL     IDENTIFICATION  Patient Name: [name] [name] Birthdate: 1950-04-09 Sex: male Admission Date (Current Location): 12/11/2016  [facility] and [place] Number:  Herbalist and Address:  [facility] [place],  [place] [address], [place]      Provider Number: [id number]  Attending Physician Name and Address:  Florencia Reasons, MD  Relative Name and Phone Number:       Current Level of Care: Hospital Recommended Level of Care: [place] Prior Approval Number:    Date Approved/Denied:   PASRR Number: [id number]  Discharge Plan: SNF    Current Diagnoses: Patient Active Problem List   Diagnosis Date Noted  . Sepsis ([place]) 12/11/2016  . Goals of care, counseling/discussion 12/04/2016  . 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 Atrial fibrillation with RVR ([place]) 11/27/2016  . Normocytic anemia 11/27/2016  . Lymphadenopathy 11/27/2016  . Hyperlipidemia 10/24/2013  . 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 Constipation 08/05/2012  . Hyperglycemia 08/05/2012  . 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 HEMORRHAGE OF RECTUM AND ANUS 05/17/2008  . ABDOMINAL PAIN, LEFT LOWER QUADRANT 05/17/2008  . DYSLIPIDEMIA 04/07/2008  . 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 Essential hypertension 04/07/2008  . DIVERTICULOSIS, MILD 04/07/2008    Orientation RESPIRATION BLADDER Height & Weight     Self, Situation, Place  Normal Incontinent Weight: 199 lb 8.3 oz (90.5 kg) Height:  6' (182.9 cm)  BEHAVIORAL SYMPTOMS/MOOD NEUROLOGICAL BOWEL NUTRITION STATUS      Continent Diet(see dc summary)  AMBULATORY STATUS COMMUNICATION OF NEEDS Skin   Extensive Assist Verbally Other (Comment)(Incision  Location: (c) Back Location Orientation: Left;Lower;Mid   Adhesive Bandage Dressing Changed PRN )                       Personal Care Assistance Level of Assistance  Bathing, Feeding, Dressing Bathing Assistance: Maximum  assistance Feeding assistance: Independent Dressing Assistance: Maximum assistance     Functional Limitations Info  Sight, Hearing, Speech Sight Info: Adequate Hearing Info: Adequate Speech Info: Adequate    SPECIAL CARE FACTORS FREQUENCY                       Contractures Contractures Info: Not present    Additional Factors Info  Code Status, Allergies, Isolation Precautions Code Status Info: Full Code Allergies Info: NKA     Isolation Precautions Info: Contact Precautions   Infection:MRSA     Current Medications (12/13/2016):  This is the current hospital active medication list Current Facility-Administered Medications  Medication Dose Route Frequency Provider Last Rate Last Dose  . acetaminophen (TYLENOL) tablet 650 mg  650 mg Oral Q6H PRN Ivor Costa, MD       Or  . acetaminophen (TYLENOL) suppository 650 mg  650 mg Rectal Q6H PRN Ivor Costa, MD      . allopurinol (ZYLOPRIM) tablet 300 mg  300 mg Oral Daily Ivor Costa, MD   300 mg at 12/13/16 1019  . amiodarone (PACERONE) tablet 200 mg  200 mg Oral Daily Ivor Costa, MD   200 mg at 12/13/16 1020  . aspirin chewable tablet 81 mg  81 mg Oral Daily Ivor Costa, MD   81 mg at 12/13/16 1021  . atorvastatin (LIPITOR) tablet 20 mg  20 mg Oral QHS Ivor Costa, MD   20 mg at 12/12/16 2144  . Chlorhexidine Gluconate Cloth 2 % PADS 6 each  6 each  Topical Q0600 Florencia Reasons, MD   6 each at 12/13/16 [phone]  . cholecalciferol (VITAMIN D) tablet 1,000 Units  1,000 Units Oral Daily Ivor Costa, MD   1,000 Units at 12/13/16 1020  . diltiazem (CARDIZEM CD) 24 hr capsule 240 mg  240 mg Oral Daily Ivor Costa, MD   240 mg at 12/13/16 1019  . docusate sodium (COLACE) capsule 100 mg  100 mg Oral BID Ivor Costa, MD   100 mg at 12/13/16 1020  . famotidine (PEPCID) tablet 20 mg  20 mg Oral BID Florencia Reasons, MD   20 mg at 12/13/16 1020  . ferrous sulfate tablet 325 mg  325 mg Oral Q breakfast Ivor Costa, MD   325 mg at 12/13/16 0850  . folic acid (FOLVITE)  tablet 1 mg  1 mg Oral Daily Florencia Reasons, MD   1 mg at 12/13/16 1020  . hydrALAZINE (APRESOLINE) injection 5 mg  5 mg Intravenous Q2H PRN Ivor Costa, MD      . hydrOXYzine (ATARAX/VISTARIL) tablet 10 mg  10 mg Oral TID PRN Ivor Costa, MD   10 mg at 12/13/16 0244  . loratadine (CLARITIN) tablet 10 mg  10 mg Oral Daily Ivor Costa, MD   10 mg at 12/13/16 1019  . metoprolol tartrate (LOPRESSOR) tablet 50 mg  50 mg Oral BID Ivor Costa, MD   50 mg at 12/13/16 1020  . mupirocin ointment (BACTROBAN) 2 % 1 application  1 application Nasal BID Florencia Reasons, MD   1 application at 50/03/70 1021  . pantoprazole (PROTONIX) EC tablet 40 mg  40 mg Oral Daily Ivor Costa, MD   40 mg at 12/13/16 1021  . piperacillin-tazobactam (ZOSYN) IVPB 3.375 g  3.375 g Intravenous Q8H Dorrene German, RPH 12.5 mL/hr at 12/13/16 1355 3.375 g at 12/13/16 1355  . predniSONE (DELTASONE) tablet 60 mg  60 mg Oral Q breakfast Florencia Reasons, MD   60 mg at 12/13/16 0849  . zolpidem (AMBIEN) tablet 5 mg  5 mg Oral QHS PRN Ivor Costa, MD       Facility-Administered Medications Ordered in Other Encounters  Medication Dose Route Frequency Provider Last Rate Last Dose  . famotidine (PEPCID) IVPB 20 mg premix  20 mg Intravenous Q12H Harle Stanford., PA-C   20 mg at 12/11/16 1725     Discharge Medications: Please see discharge summary for a list of discharge medications.  Relevant Imaging Results:  Relevant Lab Results:   Additional Information SSN: [id number]  Burnis Medin, LCSW

## 2016-12-13 NOTE — Progress Notes (Addendum)
[name] [name]   DOB:[birth year]   XN#:[id number]   URK#:[id number]   Oncology follow-up note   subjective: I am covering Dr. Jana Hakim to see the patient over the weekend.  He was admitted for infusion reaction during his first dose of Rituxan 2 days ago.  He developed fever in the ED.  He had no more chills,  Afebrile with stable VS.    Objective:  Vitals:   12/13/16 1200 12/13/16 1540  BP:  113/69  Pulse:  98  Resp:  18  Temp: 98 F (36.7 C) 97.8 F (36.6 C)  SpO2:  96%    Body mass index is 29.06 kg/m.  Intake/Output Summary (Last 24 hours) at 12/13/2016 1743 Last data filed at 12/13/2016 1355 Gross per 24 hour  Intake 490 ml  Output 300 ml  Net 190 ml     Sclerae unicteric  Oropharynx clear  Bulky lymphadenopathy in right lateral upper neck  Lungs clear -- no rales or rhonchi  Heart regular rate and rhythm  Abdomen benign  MSK no focal spinal tenderness, no peripheral edema, a large mass in the right upper arm  Neuro nonfocal     CBG (last 3)  Recent Labs    12/11/16 1818  GLUCAP 144*     Labs:  Lab Results  Component Value Date   WBC 7.4 12/13/2016   HGB 8.5 (L) 12/13/2016   HCT 26.6 (L) 12/13/2016   MCV 89.6 12/13/2016   PLT 101 (L) 12/13/2016   NEUTROABS 6.1 12/13/2016    CMP Latest Ref Rng & Units 12/13/2016 12/12/2016 12/11/2016  Glucose 65 - 99 mg/dL 126(H) 177(H) 145(H)  BUN 6 - 20 mg/dL 23(H) 22(H) 24(H)  Creatinine 0.61 - 1.24 mg/dL 0.74 0.75 1.02  Sodium 135 - 145 mmol/L 139 138 140  Potassium 3.5 - 5.1 mmol/L 3.9 4.2 5.1  Chloride 101 - 111 mmol/L 112(H) 112(H) 110  CO2 22 - 32 mmol/L 21(L) 19(L) 18(L)  Calcium 8.9 - 10.3 mg/dL 7.8(L) 7.4(L) 8.1(L)  Total Protein 6.5 - 8.1 g/dL 5.2(L) - 6.2(L)  Total Bilirubin 0.3 - 1.2 mg/dL 1.6(H) - 2.31(H)  Alkaline Phos 38 - 126 U/L 85 - 124  AST 15 - 41 U/L 43(H) - 41(H)  ALT 17 - 63 U/L 15(L) - 18    Urine Studies No results for input(s): UHGB, CRYS in the last 72 hours.  Invalid input(s):  UACOL, UAPR, USPG, UPH, UTP, UGL, UKET, UBIL, UNIT, UROB, [place], UEPI, UWBC, [place], [place], [place], [place], [place]  Basic Metabolic Panel: Recent Labs  Lab 12/11/16 0855  12/11/16 1816 12/12/16 0148 12/13/16 0318  NA 140  --  140 138 139  K 4.9   < > 5.1 4.2 3.9  CL  --   --  110 112* 112*  CO2 21*  --  18* 19* 21*  GLUCOSE 90  --  145* 177* 126*  BUN 19.7  --  24* 22* 23*  CREATININE 0.8  --  1.02 0.75 0.74  CALCIUM 8.9  --  8.1* 7.4* 7.8*  MG  --   --   --   --  2.1   < > = values in this interval not displayed.   GFR Estimated Creatinine Clearance: 109.7 mL/min (by C-G formula based on SCr of 0.74 mg/dL). Liver Function Tests: Recent Labs  Lab 12/11/16 0855 12/13/16 0318  AST 41* 43*  ALT 18 15*  ALKPHOS 124 85  BILITOT 2.31* 1.6*  PROT 6.2* 5.2*  ALBUMIN 2.2*  2.0*   No results for input(s): LIPASE, AMYLASE in the last 168 hours. No results for input(s): AMMONIA in the last 168 hours. Coagulation profile Recent Labs  Lab 12/11/16 2253  INR 1.52    CBC: Recent Labs  Lab 12/11/16 0855 12/11/16 1816 12/12/16 0148 12/13/16 0318  WBC 6.8 4.7 8.0 7.4  NEUTROABS 4.4 3.7  --  6.1  HGB 10.0* 8.9* 8.1* 8.5*  HCT 31.2* 29.4* 25.5* 26.6*  MCV 88.2 92.5 90.4 89.6  PLT 178 111* 87* 101*   Cardiac Enzymes: Recent Labs  Lab 12/11/16 2253 12/12/16 0148 12/12/16 1005  TROPONINI 0.06* 0.05* <0.03   BNP: Invalid input(s): POCBNP CBG: Recent Labs  Lab 12/11/16 1818  GLUCAP 144*   D-Dimer No results for input(s): DDIMER in the last 72 hours. Hgb A1c Recent Labs    12/12/16 0148  HGBA1C 5.2   Lipid Profile Recent Labs    12/12/16 0148  CHOL 99  HDL 16*  LDLCALC 70  TRIG 67  CHOLHDL 6.2   Thyroid function studies Recent Labs    12/12/16 0148  TSH 1.881   Anemia work up No results for input(s): VITAMINB12, FOLATE, FERRITIN, TIBC, IRON, RETICCTPCT in the last 72 hours. Microbiology Recent Results (from the past 240 hour(s))  TECHNOLOGIST REVIEW      Status: None   Collection Time: 12/11/16  8:55 AM  Result Value Ref Range Status   Technologist Review   Final    Large Variant lymphs present- few with nucleoli, rare myelocyte seen  Blood Culture (routine x 2)     Status: None (Preliminary result)   Collection Time: 12/11/16  8:12 PM  Result Value Ref Range Status   Specimen Description BLOOD LEFT ANTECUBITAL  Final   Special Requests   Final    BOTTLES DRAWN AEROBIC AND ANAEROBIC Blood Culture adequate volume   Culture   Final    NO GROWTH 2 DAYS Performed at [facility], [address], [place], [place] [address]    Report Status PENDING  Incomplete  Blood Culture (routine x 2)     Status: None (Preliminary result)   Collection Time: 12/11/16  8:17 PM  Result Value Ref Range Status   Specimen Description BLOOD LEFT WRIST  Final   Special Requests   Final    BOTTLES DRAWN AEROBIC AND ANAEROBIC Blood Culture adequate volume   Culture   Final    NO GROWTH 2 DAYS Performed at [facility], [place] [address] [place], [place] [address]    Report Status PENDING  Incomplete  MRSA PCR Screening     Status: Abnormal   Collection Time: 12/12/16 12:54 AM  Result Value Ref Range Status   MRSA by PCR POSITIVE (A) NEGATIVE Final    Comment:        The GeneXpert MRSA Assay (FDA approved for NASAL specimens only), is one component of a comprehensive MRSA colonization surveillance program. It is not intended to diagnose MRSA infection nor to guide or monitor treatment for MRSA infections. RESULT CALLED TO, READ BACK BY AND VERIFIED WITHVira Blanco RN [phone] 12/12/16 A NAVARRO       Studies:  Ct Humerus Right Wo Contrast  Result Date: 12/13/2016 CLINICAL DATA:  Right upper arm mass.  History mantle cell lymphoma. EXAM: CT OF THE RIGHT HUMERUS WITHOUT CONTRAST TECHNIQUE: Multidetector CT imaging was performed according to the standard protocol. Multiplanar CT image reconstructions were also generated. COMPARISON:  CT chest  dated November 27, 2016. FINDINGS: Bones/Joint/Cartilage No acute fracture or malalignment. Mild degenerative changes of the acromioclavicular and glenohumeral joints. Osteopenia. Ligaments Suboptimally assessed by CT. Muscles and Tendons Within the right biceps muscle, there is a large predominantly fat density mass measuring 10.2 x 11.6 x 16.5 cm. There is minimal internal stranding without focal nodularity. The rotator cuff is grossly intact. Soft tissues Markedly enlarged right axillary and lower right cervical lymph nodes. Unchanged simple cysts in the liver. IMPRESSION: 1. Large, 16.5 cm predominantly fat density mass within the right biceps muscle, most consistent with a lipoma. Minimal internal stranding without focal nodularity makes liposarcoma unlikely. 2. Markedly enlarged right axillary and lower right cervical lymph nodes, consistent with history of mantle cell lymphoma. Electronically Signed   By: Titus Dubin M.D.   On: 12/13/2016 14:15   US Scrotum  Result Date: 12/13/2016 CLINICAL DATA:  Chronic scrotal edema EXAM: ULTRASOUND OF SCROTUM TECHNIQUE: Complete ultrasound examination of the testicles, epididymis, and other scrotal structures was performed. COMPARISON:  None. FINDINGS: Right testicle Measurements: 4.0 x 1.9 x 2.5 cm. No mass or microlithiasis visualized. Left testicle Measurements: 3.9 x 2.4 x 2.6 cm. No mass or microlithiasis visualized. Right epididymis:  4 mm cyst in the epididymal head. Left epididymis:  Normal in size and appearance. Hydrocele:  Bilateral hydroceles Varicocele:  None visualized. There is a complex fluid collection/cyst in the left scrotum measuring 8.2 x 6.4 x 6.2 cm. Layering internal debris. This is lateral to the left testicle. Complex cystic area noted in the right inguinal canal with internal septations. IMPRESSION: No testicular abnormality. Complex cystic area within the right inguinal canal containing internal septations of unknown etiology. Large  complex cystic area in the left lateral scrotum measuring up to 8.2 cm. This could reflect complex hydrocele or spermatocele. Electronically Signed   By: Rolm Baptise M.D.   On: 12/13/2016 13:27   Dg Chest Portable 1 View  Result Date: 12/11/2016 CLINICAL DATA:  Shortness of breath EXAM: PORTABLE CHEST 1 VIEW COMPARISON:  11/30/2016 FINDINGS: 1756 hours. The cardio pericardial silhouette is enlarged. The lungs are clear without focal pneumonia, edema, pneumothorax or pleural effusion. The visualized bony structures of the thorax are intact. Telemetry leads overlie the chest. IMPRESSION: Stable.  No acute cardiopulmonary findings. Electronically Signed   By: Misty Stanley M.D.   On: 12/11/2016 18:38    Assessment: 66 y.o.   1. Infusion reaction to Rituxan, vs sepsis. Negative ID workup so far  2.  Mental cell lymphoma, stage IV with bone marrow involvement  3. Anemia secondary to #2 4.  Hyperbilirubinemia, improving  5. Rapid AF, rate controlled   Plan:  -his ID work up has been negative so far, I think he likely had severe infusion reaction to rituxan, less likely sepsis  -no lab evidence of TLS, will continue monitoring  -He is currently on prednisone, per Dr. Jana Hakim  -outpt chemotherapy for his mental cell lymphoma per Dr. Jana Hakim  -will follow up as needed.    Truitt Merle, MD 12/13/2016  5:43 PM

## 2016-12-14 DIAGNOSIS — R6520 Severe sepsis without septic shock: Secondary | ICD-10-CM | POA: Diagnosis not present

## 2016-12-14 DIAGNOSIS — C8580 Other specified types of non-Hodgkin lymphoma, unspecified site: Secondary | ICD-10-CM | POA: Diagnosis not present

## 2016-12-14 DIAGNOSIS — I10 Essential (primary) hypertension: Secondary | ICD-10-CM | POA: Diagnosis not present

## 2016-12-14 DIAGNOSIS — J90 Pleural effusion, not elsewhere classified: Secondary | ICD-10-CM | POA: Diagnosis not present

## 2016-12-14 DIAGNOSIS — I639 Cerebral infarction, unspecified: Secondary | ICD-10-CM | POA: Diagnosis not present

## 2016-12-14 DIAGNOSIS — I482 Chronic atrial fibrillation: Secondary | ICD-10-CM | POA: Diagnosis not present

## 2016-12-14 DIAGNOSIS — I361 Nonrheumatic tricuspid (valve) insufficiency: Secondary | ICD-10-CM | POA: Diagnosis not present

## 2016-12-14 DIAGNOSIS — R509 Fever, unspecified: Secondary | ICD-10-CM | POA: Diagnosis not present

## 2016-12-14 DIAGNOSIS — I4891 Unspecified atrial fibrillation: Secondary | ICD-10-CM | POA: Diagnosis not present

## 2016-12-14 DIAGNOSIS — J9601 Acute respiratory failure with hypoxia: Secondary | ICD-10-CM | POA: Diagnosis not present

## 2016-12-14 DIAGNOSIS — M6281 Muscle weakness (generalized): Secondary | ICD-10-CM | POA: Diagnosis not present

## 2016-12-14 DIAGNOSIS — Z5112 Encounter for antineoplastic immunotherapy: Secondary | ICD-10-CM | POA: Diagnosis not present

## 2016-12-14 DIAGNOSIS — D6959 Other secondary thrombocytopenia: Secondary | ICD-10-CM | POA: Diagnosis present

## 2016-12-14 DIAGNOSIS — I951 Orthostatic hypotension: Secondary | ICD-10-CM | POA: Diagnosis present

## 2016-12-14 DIAGNOSIS — R61 Generalized hyperhidrosis: Secondary | ICD-10-CM | POA: Diagnosis not present

## 2016-12-14 DIAGNOSIS — D696 Thrombocytopenia, unspecified: Secondary | ICD-10-CM | POA: Diagnosis not present

## 2016-12-14 DIAGNOSIS — G9389 Other specified disorders of brain: Secondary | ICD-10-CM | POA: Diagnosis present

## 2016-12-14 DIAGNOSIS — I5032 Chronic diastolic (congestive) heart failure: Secondary | ICD-10-CM | POA: Diagnosis not present

## 2016-12-14 DIAGNOSIS — Q040 Congenital malformations of corpus callosum: Secondary | ICD-10-CM | POA: Diagnosis not present

## 2016-12-14 DIAGNOSIS — R0902 Hypoxemia: Secondary | ICD-10-CM | POA: Diagnosis not present

## 2016-12-14 DIAGNOSIS — R531 Weakness: Secondary | ICD-10-CM | POA: Diagnosis not present

## 2016-12-14 DIAGNOSIS — J189 Pneumonia, unspecified organism: Secondary | ICD-10-CM | POA: Diagnosis not present

## 2016-12-14 DIAGNOSIS — R7989 Other specified abnormal findings of blood chemistry: Secondary | ICD-10-CM | POA: Diagnosis not present

## 2016-12-14 DIAGNOSIS — K219 Gastro-esophageal reflux disease without esophagitis: Secondary | ICD-10-CM | POA: Diagnosis not present

## 2016-12-14 DIAGNOSIS — Z9181 History of falling: Secondary | ICD-10-CM | POA: Diagnosis not present

## 2016-12-14 DIAGNOSIS — F411 Generalized anxiety disorder: Secondary | ICD-10-CM | POA: Diagnosis not present

## 2016-12-14 DIAGNOSIS — I272 Pulmonary hypertension, unspecified: Secondary | ICD-10-CM | POA: Diagnosis not present

## 2016-12-14 DIAGNOSIS — Y95 Nosocomial condition: Secondary | ICD-10-CM | POA: Diagnosis not present

## 2016-12-14 DIAGNOSIS — Z800 Family history of malignant neoplasm of digestive organs: Secondary | ICD-10-CM | POA: Diagnosis not present

## 2016-12-14 DIAGNOSIS — Z87891 Personal history of nicotine dependence: Secondary | ICD-10-CM | POA: Diagnosis not present

## 2016-12-14 DIAGNOSIS — J80 Acute respiratory distress syndrome: Secondary | ICD-10-CM | POA: Diagnosis not present

## 2016-12-14 DIAGNOSIS — E872 Acidosis: Secondary | ICD-10-CM | POA: Diagnosis not present

## 2016-12-14 DIAGNOSIS — R591 Generalized enlarged lymph nodes: Secondary | ICD-10-CM | POA: Diagnosis not present

## 2016-12-14 DIAGNOSIS — T451X5A Adverse effect of antineoplastic and immunosuppressive drugs, initial encounter: Secondary | ICD-10-CM | POA: Diagnosis present

## 2016-12-14 DIAGNOSIS — D630 Anemia in neoplastic disease: Secondary | ICD-10-CM | POA: Diagnosis present

## 2016-12-14 DIAGNOSIS — I959 Hypotension, unspecified: Secondary | ICD-10-CM | POA: Diagnosis not present

## 2016-12-14 DIAGNOSIS — I890 Lymphedema, not elsewhere classified: Secondary | ICD-10-CM | POA: Diagnosis not present

## 2016-12-14 DIAGNOSIS — I63442 Cerebral infarction due to embolism of left cerebellar artery: Secondary | ICD-10-CM | POA: Diagnosis not present

## 2016-12-14 DIAGNOSIS — C8310 Mantle cell lymphoma, unspecified site: Secondary | ICD-10-CM | POA: Diagnosis not present

## 2016-12-14 DIAGNOSIS — I63512 Cerebral infarction due to unspecified occlusion or stenosis of left middle cerebral artery: Secondary | ICD-10-CM | POA: Diagnosis not present

## 2016-12-14 DIAGNOSIS — D638 Anemia in other chronic diseases classified elsewhere: Secondary | ICD-10-CM | POA: Diagnosis not present

## 2016-12-14 DIAGNOSIS — I63112 Cerebral infarction due to embolism of left vertebral artery: Secondary | ICD-10-CM | POA: Diagnosis not present

## 2016-12-14 DIAGNOSIS — E860 Dehydration: Secondary | ICD-10-CM | POA: Diagnosis present

## 2016-12-14 DIAGNOSIS — D6481 Anemia due to antineoplastic chemotherapy: Secondary | ICD-10-CM | POA: Diagnosis present

## 2016-12-14 DIAGNOSIS — R4182 Altered mental status, unspecified: Secondary | ICD-10-CM | POA: Diagnosis not present

## 2016-12-14 DIAGNOSIS — A419 Sepsis, unspecified organism: Secondary | ICD-10-CM | POA: Diagnosis not present

## 2016-12-14 DIAGNOSIS — C8311 Mantle cell lymphoma, lymph nodes of head, face, and neck: Secondary | ICD-10-CM | POA: Diagnosis not present

## 2016-12-14 DIAGNOSIS — Z8673 Personal history of transient ischemic attack (TIA), and cerebral infarction without residual deficits: Secondary | ICD-10-CM | POA: Diagnosis not present

## 2016-12-14 DIAGNOSIS — D649 Anemia, unspecified: Secondary | ICD-10-CM | POA: Diagnosis not present

## 2016-12-14 DIAGNOSIS — I480 Paroxysmal atrial fibrillation: Secondary | ICD-10-CM | POA: Diagnosis not present

## 2016-12-14 DIAGNOSIS — Z515 Encounter for palliative care: Secondary | ICD-10-CM | POA: Diagnosis not present

## 2016-12-14 DIAGNOSIS — E7849 Other hyperlipidemia: Secondary | ICD-10-CM | POA: Diagnosis not present

## 2016-12-14 DIAGNOSIS — F0150 Vascular dementia without behavioral disturbance: Secondary | ICD-10-CM | POA: Diagnosis not present

## 2016-12-14 DIAGNOSIS — R41841 Cognitive communication deficit: Secondary | ICD-10-CM | POA: Diagnosis not present

## 2016-12-14 DIAGNOSIS — I9589 Other hypotension: Secondary | ICD-10-CM | POA: Diagnosis not present

## 2016-12-14 DIAGNOSIS — N179 Acute kidney failure, unspecified: Secondary | ICD-10-CM | POA: Diagnosis not present

## 2016-12-14 DIAGNOSIS — C8590 Non-Hodgkin lymphoma, unspecified, unspecified site: Secondary | ICD-10-CM | POA: Diagnosis not present

## 2016-12-14 DIAGNOSIS — W19XXXA Unspecified fall, initial encounter: Secondary | ICD-10-CM | POA: Diagnosis not present

## 2016-12-14 DIAGNOSIS — J810 Acute pulmonary edema: Secondary | ICD-10-CM | POA: Diagnosis not present

## 2016-12-14 DIAGNOSIS — E861 Hypovolemia: Secondary | ICD-10-CM | POA: Diagnosis not present

## 2016-12-14 DIAGNOSIS — N170 Acute kidney failure with tubular necrosis: Secondary | ICD-10-CM | POA: Diagnosis present

## 2016-12-14 DIAGNOSIS — Z66 Do not resuscitate: Secondary | ICD-10-CM | POA: Diagnosis not present

## 2016-12-14 DIAGNOSIS — E785 Hyperlipidemia, unspecified: Secondary | ICD-10-CM | POA: Diagnosis not present

## 2016-12-14 DIAGNOSIS — R0603 Acute respiratory distress: Secondary | ICD-10-CM | POA: Diagnosis not present

## 2016-12-14 LAB — CBC WITH DIFFERENTIAL/PLATELET
Basophils Absolute: 0.2 10*3/uL — ABNORMAL HIGH (ref 0.0–0.1)
Basophils Relative: 3 %
Eosinophils Absolute: 0 10*3/uL (ref 0.0–0.7)
Eosinophils Relative: 0 %
HEMATOCRIT: 25.9 % — AB (ref 39.0–52.0)
HEMOGLOBIN: 8.2 g/dL — AB (ref 13.0–17.0)
LYMPHS ABS: 0.8 10*3/uL (ref 0.7–4.0)
Lymphocytes Relative: 13 %
MCH: 28.8 pg (ref 26.0–34.0)
MCHC: 31.7 g/dL (ref 30.0–36.0)
MCV: 90.9 fL (ref 78.0–100.0)
MONOS PCT: 11 %
Monocytes Absolute: 0.7 10*3/uL (ref 0.1–1.0)
NEUTROS ABS: 4.4 10*3/uL (ref 1.7–7.7)
NEUTROS PCT: 73 %
Platelets: 99 10*3/uL — ABNORMAL LOW (ref 150–400)
RBC: 2.85 MIL/uL — AB (ref 4.22–5.81)
RDW: 18.2 % — ABNORMAL HIGH (ref 11.5–15.5)
WBC: 6 10*3/uL (ref 4.0–10.5)

## 2016-12-14 LAB — COMPREHENSIVE METABOLIC PANEL
ALBUMIN: 2 g/dL — AB (ref 3.5–5.0)
ALK PHOS: 68 U/L (ref 38–126)
ALT: 14 U/L — AB (ref 17–63)
ANION GAP: 7 (ref 5–15)
AST: 27 U/L (ref 15–41)
BILIRUBIN TOTAL: 1.3 mg/dL — AB (ref 0.3–1.2)
BUN: 18 mg/dL (ref 6–20)
CALCIUM: 7.9 mg/dL — AB (ref 8.9–10.3)
CO2: 23 mmol/L (ref 22–32)
CREATININE: 0.57 mg/dL — AB (ref 0.61–1.24)
Chloride: 109 mmol/L (ref 101–111)
GFR calc non Af Amer: >60 mL/min (ref >60)
GLUCOSE: 105 mg/dL — AB (ref 65–99)
Potassium: 4.1 mmol/L (ref 3.5–5.1)
Sodium: 139 mmol/L (ref 135–145)
TOTAL PROTEIN: 5.1 g/dL — AB (ref 6.5–8.1)

## 2016-12-14 MED ORDER — LISINOPRIL 2.5 MG PO TABS: 2.5000 mg | ORAL_TABLET | Freq: Every day | ORAL | 11 refills | Status: AC

## 2016-12-14 MED ORDER — AMOXICILLIN-POT CLAVULANATE 875-125 MG PO TABS
1.0000 | ORAL_TABLET | Freq: Two times a day (BID) | ORAL | Status: DC
  Administered 2016-12-14: 1 via ORAL
  Filled 2016-12-14: qty 1

## 2016-12-14 MED ORDER — CHLORHEXIDINE GLUCONATE CLOTH 2 % EX PADS: 6.0000 | MEDICATED_PAD | Freq: Every day | CUTANEOUS | 0 refills | Status: AC

## 2016-12-14 MED ORDER — LORAZEPAM 2 MG/ML IJ SOLN
1.0000 mg | Freq: Once | INTRAMUSCULAR | Status: AC
  Administered 2016-12-14: 1 mg via INTRAVENOUS
  Filled 2016-12-14: qty 1

## 2016-12-14 MED ORDER — MUPIROCIN 2 % EX OINT: 1.0000 "application " | TOPICAL_OINTMENT | Freq: Two times a day (BID) | CUTANEOUS | 0 refills | Status: AC

## 2016-12-14 MED ORDER — AMOXICILLIN-POT CLAVULANATE 875-125 MG PO TABS: 1.0000 | ORAL_TABLET | Freq: Two times a day (BID) | ORAL | 0 refills | Status: AC

## 2016-12-14 MED ORDER — FOLIC ACID 1 MG PO TABS: 1.0000 mg | ORAL_TABLET | Freq: Every day | ORAL | 0 refills | Status: AC

## 2016-12-14 MED ORDER — PREDNISONE 20 MG PO TABS: 60.0000 mg | ORAL_TABLET | Freq: Every day | ORAL | 0 refills | Status: AC

## 2016-12-14 NOTE — Social Work (Signed)
CSW called [facility] admission staff to f/u on patient's return and discharge.  CSW left message and will f/u as patient is ready to return to [facility].  Elissa Hefty, LCSW Clinical Social Worker [phone]

## 2016-12-14 NOTE — Clinical Social Work Placement (Signed)
   CLINICAL SOCIAL WORK PLACEMENT  NOTE  Date:  12/14/2016  Patient Details  Name: RAVEN HARMES MRN: [id number] Date of Birth: 24-Jun-1950  Clinical Social Work is seeking post-discharge placement for this patient at the [place] level of care (*CSW will initial, date and re-position this form in  chart as items are completed):  Yes   Patient/family provided with [facility] Work Department's list of facilities offering this level of care within the geographic area requested by the patient (or if unable, by the patient's family).  Yes   Patient/family informed of their freedom to choose among providers that offer the needed level of care, that participate in Medicare, Medicaid or managed care program needed by the patient, have an available bed and are willing to accept the patient.  Yes   Patient/family informed of [facility]'s ownership interest in [facility] and [facility], as well as of the fact that they are under no obligation to receive care at these facilities.  PASRR submitted to EDS on       PASRR number received on       Existing PASRR number confirmed on 12/13/16     FL2 transmitted to all facilities in geographic area requested by pt/family on       FL2 transmitted to all facilities within larger geographic area on 12/13/16     Patient informed that his/her managed care company has contracts with or will negotiate with certain facilities, including the following:        Yes   Patient/family informed of bed offers received.  Patient chooses bed at [facility]     Physician recommends and patient chooses bed at      Patient to be transferred to [facility] on 12/14/16.  Patient to be transferred to facility by [facility]     Patient family notified on 12/14/16 of transfer.  Name of family member notified:  slef     PHYSICIAN Please prepare prescriptions     Additional Comment:     _______________________________________________ Normajean Baxter, LCSW 12/14/2016, 12:18 PM

## 2016-12-14 NOTE — Progress Notes (Signed)
Pt to be discharged back to [facility] this afternoon. Report called to Facility. Facility voicing concerns regarding Pt's admission this afternoon due to the problem doing admission process today. RN called Education officer, museum and she will touch base with this facility regarding Pt returning to [facility] this afternoon.

## 2016-12-14 NOTE — Care Management Important Message (Signed)
Important Message  Patient Details  Name: [name] [name] MRN: [id number] Date of Birth: 07-10-1950   Medicare Important Message Given:  Yes(signed copy)    Erenest Rasher, RN 12/14/2016, 11:49 AM

## 2016-12-14 NOTE — Progress Notes (Signed)
Social Worker has again spoken to [facility] SN[facility] admissions and Pt can be discharged today. Ambulance transport here. VSS and no acute changes noted with Pt's assessment at time of discharge

## 2016-12-14 NOTE — Social Work (Addendum)
CSW called SNF again and spoke with staff who indicated that they will need to get in touch with admissions prior to patient returning. CSW advised that CSW has not been able to reach [facility] in admissions. SNF staff will f/u as she would prefer patient return to SNF in the evening.    CSW will continue to follow up for admission staff to confirm that patient can return today.  12:45pm: CSW received confirmation from [facility]. CSW will set up transport.  1:37pm: CSW was advised by RN on floor that SNF was not willing to take patient due to administrative concerns at SNF.  CSW then called [name] in admissions again to confirm that patient can return and she confirmed same. CSW then called RN on floor again to advise that patient can return to [facility].  Elissa Hefty, LCSW Clinical Social Worker [phone]

## 2016-12-14 NOTE — Social Work (Signed)
Clinical Social Worker facilitated patient discharge including contacting patient family and facility to confirm patient discharge plans.  Clinical information faxed to facility and family agreeable with plan.    CSW arranged ambulance transport via [facility] to [facility].    RN to call [phone] to give report prior to discharge.  Clinical Social Worker will sign off for now as social work intervention is no longer needed. Please consult [place] again if new need arises.  Elissa Hefty, [place] Social Worker-Weekend [phone]

## 2016-12-14 NOTE — Discharge Summary (Addendum)
Discharge Summary  [name] [name] ZOX:[id number] DOB: 03/21/1950  PCP: Wenda Low, MD  Admit date: 12/11/2016 Discharge date: 12/14/2016  Time spent: >21mns, more than 50% time spent on coordination of care  Recommendations for Outpatient Follow-up:  1. F/u with SNF MD  for hospital discharge follow up, repeat cbc/bmp at follow up 2. F/u with oncology as scheduled 3. F/u with cardiology as scheduled 4.    snf MD to arrange urology follow up for "Complex cystic area within the right inguinal canal containing internal septations of unknown etiology. Large complex cystic area in the left lateral scrotum measuring up 4. to 8.2 cm. This could reflect complex hydrocele or spermatocele."  Discharge Diagnoses:  Active Hospital Problems   Diagnosis Date Noted  . [place]epsis ([facility] 12/11/2016  . [place]le cell [place] ([facility] 12/02/2016  . Normocytic anemia 11/27/2016  . Atrial fibrillation with RVR ([facility] 11/27/2016  . Hyperlipidemia 10/24/2013  . Essential hypertension 04/07/2008    Resolved Hospital Problems  No resolved problems to display.    Discharge Condition: stable  Diet recommendation: heart healthy  Filed Weights   12/12/16 0052 12/13/16 0500 12/13/16 1540  Weight: 90.8 kg (200 lb 2.8 oz) 90.5 kg (199 lb 8.3 oz) 97.2 kg (214 lb 4.6 oz)    History of present illness:  PCP: HWenda Low MD   Patient coming from:  The patient is coming from home.  At baseline, pt is independent for most of ADL.  Chief Complaint: Fever, chills  HPI: [name] BIRDWELLis a 66y.o. male with medical history significant of hypertension, hyperlipidemia, stroke, GERD, gout, iron deficiency anemia, atrial fibrillation not on anticoagulants, mantle cell lymphoma, who presents with fever and chills.  Patient is a very poor historian, history is limited. Patient states that he developed fever, chills, rigors when he was receiving his first dose of Rituxan infusion in cancer Center at about 2 PM. He  also had tachycardia. Patient was treated with Solu-Medrol and Pepcid. When I saw patient in ED, patient's symptoms have improved. He denies chest pain, SOB, nausea, vomiting, diarrhea or abdominal pain. No symptoms of UTI or unilateral weakness. Patient's right upper arm looks swollen. Per patient, this is a chronic issue since 1993 after he had injury to that arm. Patient was found to have atrial fibrillation with RVR with heart rate up to 169.  ED Course: pt was found to have WBC 4.7, lactic acid of 4.17, negative urinalysis negative, temperature 102.3, tachycardia, tachypnea, oxygen saturation to 78, but improved to 99% currently. Soft blood pressure. Negative chest x-ray. Patient is admitted to stepdown as inpatient. EDP discussed with oncologist, Dr. MJoycelyn Schmid who recommended to treat patient as sepsis.    Hospital Course:  Principal Problem:   Sepsis ([facility] Active Problems:   Essential hypertension   Hyperlipidemia   Atrial fibrillation with RVR (HCC)   Normocytic anemia   Mantle cell lymphoma (HCC)   Sepsis vs transfusion reaction to rituximab -Symptoms started with first dose of rituximab infusion at the cancer center on 11/29 -He presented to the ED found to have  fever 102.3, tachycardia up to 150,  -Ua/cxr unremarkable, blood culture no growth -he is started on vanc and zosyn, Per oncology Dr mLetta Pate mantle cell lhmphoma patient is prone to have infection from gi source, vanc d/ced. He is continued on zosyn -improving, d/c on  Augmentin.   Anemia/thrombocytopenia -No sign of acute bleeding, FOBT test was negative on 11/15, may be intermittent blood loss from GI? -Patient was  evaluated by GI Dr Penelope Coop on 11/21, patient declined egd/colonosopy at that time. -Per oncology Dr Jana Hakim, change steroid to prednisone '60mg'$  daily for total of 5 days (counts improved in the past) -hgb stable, plt improving F/u with hematology/oncology Dr Jana Hakim  Atrial fibrillation with RVR  [facility]): likely triggered by sepsis.CHA2DS2-VASc Scoreis 2, needs oral anticoagulation, but patient isnot on AC due to anemia/thrombocytopenia and mental cell lymphoma He was last evaluated by cardiology on 11/17 -rate controlled today, continue aspirin, Cardizem, amiodarone, Lopressor -f/u with cardiology as already scheduled  HTN; continue cardizem/lopressor, decrease lisinopril. D/c hctz ( prone to dehydration)   Mantle cell lymphoma [facility]): f/u by dr. Jana Hakim. He did not tolerate firsrt dose ofRituxan on 11/29.  Right arm large cystic structure, nontender, ct humerus showed lipoma. Detail please refer to original report.  Scrotal edema: nontender,  scrotal US " No testicular abnormality.  Complex cystic area within the right inguinal canal containing internal septations of unknown etiology. Large complex cystic area in the left lateral scrotum measuring up to 8.2 cm. This could reflect complex hydrocele or spermatocele. SNF MD to refer to urology follow up.   MRSA colonization: decolonization.  Code Status: full  Family Communication: patient , only contact listed is a friend, I am not able to reach his friend by phone.  Disposition Plan:  return to snf on 12/2  Consultants:  oncology  Procedures:  none  Antibiotics:  vanc from admission to 11/30  Zosyn from admission    Discharge Exam: BP 115/66   Pulse 84   Temp 98 F (36.7 C) (Oral)   Resp 19   Ht 6' (1.829 m)   Wt 97.2 kg (214 lb 4.6 oz)   SpO2 97%   BMI 29.06 kg/m    General:  NAD, very poor historian (h/o mild Mental Retardation)  Cardiovascular: IRRR  Respiratory: CTABL  Abdomen: Soft/ND/NT, positive BS, scrotal edema asymmetric, left> right, nontender, no erythema  Musculoskeletal: right arm large cystic structure, nontender, no erythema, range of motion intact.  no lower extremity edema  Neuro: alert, interactive   Discharge Instructions You were cared for by a  hospitalist during your hospital stay. If you have any questions about your discharge medications or the care you received while you were in the hospital after you are discharged, you can call the unit and asked to speak with the hospitalist on call if the hospitalist that took care of you is not available. Once you are discharged, your primary care physician will handle any further medical issues. Please note that NO REFILLS for any discharge medications will be authorized once you are discharged, as it is imperative that you return to your primary care physician (or establish a relationship with a primary care physician if you do not have one) for your aftercare needs so that they can reassess your need for medications and monitor your lab values.  Discharge Instructions    Diet - low sodium heart healthy   Complete by:  As directed    Increase activity slowly   Complete by:  As directed      Allergies as of 12/14/2016   No Known Allergies     Medication List    STOP taking these medications   hydrochlorothiazide 25 MG tablet Commonly known as:  HYDRODIURIL     TAKE these medications   acetaminophen 650 MG CR tablet Commonly known as:  TYLENOL Take 650 mg every 8 (eight) hours as needed by mouth (for pain or fever ("  for 48 hours")).   allopurinol 300 MG tablet Commonly known as:  ZYLOPRIM Take 1 tablet (300 mg total) by mouth daily.   amiodarone 200 MG tablet Commonly known as:  PACERONE Take 2 tablets (400 mg total) by mouth 2 (two) times daily for 4 days, THEN 1 tablet (200 mg total) daily. Start taking on:  12/06/2016 What changed:  See the new instructions.   amoxicillin-clavulanate 875-125 MG tablet Commonly known as:  AUGMENTIN Take 1 tablet by mouth every 12 (twelve) hours for 3 days.   aspirin 81 MG chewable tablet Chew 1 tablet (81 mg total) by mouth daily.   Chlorhexidine Gluconate Cloth 2 % Pads Apply 6 each topically daily at 6 (six) AM. Start taking on:   12/15/2016   diltiazem 240 MG 24 hr capsule Commonly known as:  CARDIZEM CD Take 1 capsule (240 mg total) by mouth daily.   docusate sodium 100 MG capsule Commonly known as:  COLACE Take 100 mg by mouth 2 (two) times daily. For constipation   ferrous sulfate 325 (65 FE) MG tablet Take 325 mg by mouth daily with breakfast.   folic acid 1 MG tablet Commonly known as:  FOLVITE Take 1 tablet (1 mg total) by mouth daily. Start taking on:  12/15/2016   ibuprofen 400 MG tablet Commonly known as:  ADVIL,MOTRIN Take 400 mg by mouth every 6 (six) hours as needed for moderate pain.   lisinopril 2.5 MG tablet Commonly known as:  ZESTRIL Take 1 tablet (2.5 mg total) by mouth daily. What changed:    medication strength  how much to take   loratadine 10 MG tablet Commonly known as:  CLARITIN Take 10 mg daily by mouth.   metoprolol tartrate 50 MG tablet Commonly known as:  LOPRESSOR Take 1 tablet (50 mg total) by mouth 2 (two) times daily.   mupirocin ointment 2 % Commonly known as:  BACTROBAN Place 1 application into the nose 2 (two) times daily.   omeprazole 20 MG capsule Commonly known as:  PRILOSEC Take 1 capsule by mouth daily.   predniSONE 20 MG tablet Commonly known as:  DELTASONE Take 3 tablets (60 mg total) by mouth daily with breakfast for 4 days. Start taking on:  12/15/2016   simvastatin 40 MG tablet Commonly known as:  ZOCOR Take 40 mg by mouth at bedtime.   Vitamin D-3 1000 units Caps Take 1,000 Units by mouth daily.      No Known Allergies Follow-up Information    Wenda Low, MD Follow up.   Specialty:  Internal Medicine Contact information: [address] [place] [address] [phone]        urology for srotal swelling Follow up in 3 week(s).        oncology Dr Jana Hakim Follow up.        f/u with cardiology for afib Follow up.            The results of significant diagnostics from this hospitalization (including  imaging, microbiology, ancillary and laboratory) are listed below for reference.    Significant Diagnostic Studies: Dg Chest 2 View  Result Date: 11/30/2016 CLINICAL DATA:  Fever today. EXAM: CHEST  2 VIEW COMPARISON:  CT chest 11/27/2016.  Chest radiograph 11/27/2016. FINDINGS: Mild cardiac enlargement. No vascular congestion. No airspace disease or consolidation in the lungs. No blunting of costophrenic angles. No pneumothorax. Calcification of the aorta. Prominent lymphadenopathy demonstrated at CT is not well visualized radiographically. IMPRESSION: Cardiac enlargement.  No evidence  of active pulmonary disease. Electronically Signed   By: Lucienne Capers M.D.   On: 11/30/2016 23:36   Ct Soft Tissue Neck Wo Contrast  Result Date: 11/21/2016 CLINICAL DATA:  Initial evaluation for right-sided neck swelling. EXAM: CT NECK WITHOUT CONTRAST TECHNIQUE: Multidetector CT imaging of the neck was performed following the standard protocol without intravenous contrast. COMPARISON:  None available. FINDINGS: Pharynx and larynx: Oral cavity within normal limits without mass lesion or loculated fluid collection. Patient is edentulous. Palatine tonsils symmetric and within normal limits bilaterally. Few small calcified tonsilliths noted. Parapharyngeal fat preserved. Nasopharynx within normal limits. Retropharyngeal soft tissues demonstrate no acute abnormality. Epiglottis normal. Vallecula clear. Small amount of layering secretions present within the right hypopharynx, extending into the right piriform sinus. Remainder of the hypopharynx and supraglottic larynx grossly normal. True cords symmetric and within normal limits. Subglottic airway clear. Salivary glands: Salivary glands including the parotid and submandibular glands are within normal limits bilaterally. Thyroid: Thyroid within normal limits. Lymph nodes: Extensive bulky adenopathy seen throughout the neck and visualized upper chest, slightly worse on the  right as compared to the left. Nodes are seen at essentially all levels, most prevalent within the right supraclavicular and axillary region. For reference purposes, largest discrete node within the right supraclavicular region measures approximately 2.6 x 5.1 cm (series 2, image 85). Largest discrete nodal mass within the right axillary region measures approximately 9.4 x 3.7 cm (series 2, image 114). In the left neck, largest discrete node seen at left level III and measures 1.8 x 2.7 cm (series 2, image 71). Largest node within the left upper chest seen within the left subpectoral region and measures 5.4 x 2.9 cm (series 2, image 106). Study nodes seen within the partially visualized upper mediastinum as well, largest of which measures 1 cm in the prevascular region. Findings highly concerning for lymphoproliferative disorder/ lymphoma. Vascular: Atherosclerotic changes noted about the aortic arch and carotid bifurcations. Limited intracranial: Unremarkable. Visualized orbits: Partially visualized globes and orbital soft tissues within normal limits. Mastoids and visualized paranasal sinuses: Chronic maxillary sinusitis noted, right worse than left. Retained metallic density at the posterior right maxillary sinus. Sequelae of prior ORIF at the anterior maxillary sinuses/alveolar ridge. Visualize mastoids and middle ear cavities are clear. Skeleton: No acute osseus abnormality. No worrisome lytic or blastic osseous lesions. Straightening of the normal cervical lordosis with trace retrolisthesis of C3 on C4. Moderate to advanced degenerate spondylolysis at C5-6. Upper chest: Partially visualized lungs are clear. Other: None. IMPRESSION: 1. Extensive bulky adenopathy throughout the neck and visualized upper chest, right worse than left, highly concerning for lymphoproliferative disorder/lymphoma. 2. No other acute abnormality within the neck. 3. Chronic maxillary sinusitis. Electronically Signed   By: Jeannine Boga M.D.   On: 11/21/2016 15:27   Ct Angio Chest Pe W And/or Wo Contrast  Result Date: 11/27/2016 CLINICAL DATA:  Tachycardia. Suspect pulmonary embolism. History of hypertension, ex smoker, bladder tumor. Scheduled for neck biopsy today. EXAM: CT ANGIOGRAPHY CHEST WITH CONTRAST TECHNIQUE: Multidetector CT imaging of the chest was performed using the standard protocol during bolus administration of intravenous contrast. Multiplanar CT image reconstructions and MIPs were obtained to evaluate the vascular anatomy. CONTRAST:  1108m ISOVUE-370 IOPAMIDOL (ISOVUE-370) INJECTION 76% COMPARISON:  CT neck November 21, 2016 and chest radiograph November 27, 2016 and CT abdomen and pelvis June 22, 2012 FINDINGS: CARDIOVASCULAR: Adequate contrast opacification of the pulmonary artery's. Main pulmonary artery is not enlarged. No pulmonary arterial filling defects to the level  of the segmental branches, distal branches obscured by respiratory motion. Heart is upper limits of normal in size. Mild coronary artery calcifications. Small pericardial effusion. Thoracic aorta is normal course and caliber, unremarkable. MEDIASTINUM/NODES: Greater than expected number of mildly enlarged mediastinal and hilar lymph nodes. Anterior mediastinal fat stranding. Bulky RIGHT greater than LEFT axillary lymphadenopathy, 12.9 x 6.7 cm RIGHT axillary nodal conglomeration. Bulky RIGHT grand LEFT supraclavicular lymphadenopathy. LUNGS/PLEURA: Tracheobronchial tree is patent, no pneumothorax. No pleural effusions, focal consolidations, pulmonary nodules or masses. UPPER ABDOMEN: New splenomegaly. 6.7 cm cyst (5 Hounsfield units) LEFT renal fossa, larger than prior CT. Stable 3 cm cyst (3 Hounsfield units) RIGHT lobe of the liver. Partially imaged probable portal caval lymphadenopathy. MUSCULOSKELETAL: Nonacute. RIGHT biceps intramuscular lipoma versus atrophy. Multilevel scattered Schmorl's nodes. Review of the MIP images confirms the above  findings. IMPRESSION: 1. No acute pulmonary embolism. 2. Bulky supraclavicular, axillary and possibly portal caval lymphadenopathy. 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 3. Borderline cardiomegaly.  No acute pulmonary process. Aortic Atherosclerosis (ICD10-I70.0). Electronically Signed   By: Elon Alas M.D.   On: 11/27/2016 17:13   Ct Humerus Right Wo Contrast  Result Date: 12/13/2016 CLINICAL DATA:  Right upper arm mass.  History mantle cell lymphoma. EXAM: CT OF THE RIGHT HUMERUS WITHOUT CONTRAST TECHNIQUE: Multidetector CT imaging was performed according to the standard protocol. Multiplanar CT image reconstructions were also generated. COMPARISON:  CT chest dated November 27, 2016. FINDINGS: Bones/Joint/Cartilage No acute fracture or malalignment. Mild degenerative changes of the acromioclavicular and glenohumeral joints. Osteopenia. Ligaments Suboptimally assessed by CT. Muscles and Tendons Within the right biceps muscle, there is a large predominantly fat density mass measuring 10.2 x 11.6 x 16.5 cm. There is minimal internal stranding without focal nodularity. The rotator cuff is grossly intact. Soft tissues Markedly enlarged right axillary and lower right cervical lymph nodes. Unchanged simple cysts in the liver. IMPRESSION: 1. Large, 16.5 cm predominantly fat density mass within the right biceps muscle, most consistent with a lipoma. Minimal internal stranding without focal nodularity makes liposarcoma unlikely. 2. Markedly enlarged right axillary and lower right cervical lymph nodes, consistent with history of mantle cell lymphoma. Electronically Signed   By: Titus Dubin M.D.   On: 12/13/2016 14:15   US Scrotum  Result Date: 12/13/2016 CLINICAL DATA:  Chronic scrotal edema EXAM: ULTRASOUND OF SCROTUM TECHNIQUE: Complete ultrasound examination of the testicles, epididymis, and other scrotal structures was performed. COMPARISON:  None. FINDINGS:  Right testicle Measurements: 4.0 x 1.9 x 2.5 cm. No mass or microlithiasis visualized. Left testicle Measurements: 3.9 x 2.4 x 2.6 cm. No mass or microlithiasis visualized. Right epididymis:  4 mm cyst in the epididymal head. Left epididymis:  Normal in size and appearance. Hydrocele:  Bilateral hydroceles Varicocele:  None visualized. There is a complex fluid collection/cyst in the left scrotum measuring 8.2 x 6.4 x 6.2 cm. Layering internal debris. This is lateral to the left testicle. Complex cystic area noted in the right inguinal canal with internal septations. IMPRESSION: No testicular abnormality. Complex cystic area within the right inguinal canal containing internal septations of unknown etiology. Large complex cystic area in the left lateral scrotum measuring up to 8.2 cm. This could reflect complex hydrocele or spermatocele. Electronically Signed   By: Rolm Baptise M.D.   On: 12/13/2016 13:27   Dg Chest Portable 1 View  Result Date: 12/11/2016 CLINICAL DATA:  Shortness of breath EXAM: PORTABLE CHEST 1 VIEW COMPARISON:  11/30/2016 FINDINGS: 1756 hours. The cardio  pericardial silhouette is enlarged. The lungs are clear without focal pneumonia, edema, pneumothorax or pleural effusion. The visualized bony structures of the thorax are intact. Telemetry leads overlie the chest. IMPRESSION: Stable.  No acute cardiopulmonary findings. Electronically Signed   By: Misty Stanley M.D.   On: 12/11/2016 18:38   Dg Chest Port 1 View  Result Date: 11/27/2016 CLINICAL DATA:  Atrial fibrillation EXAM: PORTABLE CHEST 1 VIEW COMPARISON:  None. FINDINGS: Cardiac silhouette is upper limits of normal. Mild aortic atherosclerotic calcification. No focal airspace consolidation or pulmonary edema. No pneumothorax or sizable pleural effusion. IMPRESSION: No active disease. Aortic Atherosclerosis (ICD10-I70.0). Electronically Signed   By: Ulyses Jarred M.D.   On: 11/27/2016 15:17   Ct Bone Marrow Biopsy  Result Date:  12/05/2016 INDICATION: 66 year old with mantle cell lymphoma. EXAM: CT GUIDED BONE MARROW ASPIRATES AND BIOPSY Physician: Stephan Minister. Anselm Pancoast, MD MEDICATIONS: None. ANESTHESIA/SEDATION: Fentanyl 50 mcg IV; Versed 1.0 mg IV Moderate Sedation Time:  12 minutes The patient was continuously monitored during the procedure by the interventional radiology nurse under my direct supervision. COMPLICATIONS: None immediate. PROCEDURE: The procedure was explained to the patient. The risks and benefits of the procedure were discussed and the patient's questions were addressed. Informed consent was obtained from the patient. The patient was placed prone on CT scan. Images of the pelvis were obtained. The right side of back was prepped and draped in sterile fashion. The skin and right posterior iliac bone were anesthetized with 1% lidocaine. 11 gauge bone needle was directed into the right iliac bone with CT guidance. Two aspirates and one core biopsy obtained. Bandage placed over the puncture site. FINDINGS: Bone needle directed in the right ilium. Extensive lymphadenopathy throughout the pelvis and inguinal regions. IMPRESSION: CT guided bone marrow aspirates and core biopsy. Electronically Signed   By: Markus Daft M.D.   On: 12/05/2016 10:19   [place] Core Biopsy (lymph Nodes)  Result Date: 11/28/2016 INDICATION: Bulky cervical axillary lymphadenopathy. Please perform ultrasound-guided biopsy for tissue diagnostic purposes. EXAM: ULTRASOUND-GUIDED RIGHT CERVICAL LYMPH NODE BIOPSY COMPARISON:  Neck CT - 11/21/2016; chest CT - 11/17/2016 MEDICATIONS: None ANESTHESIA/SEDATION: Moderate (conscious) sedation was employed during this procedure. A total of Versed 2 mg and Fentanyl 75 mcg was administered intravenously. Moderate Sedation Time: 10 minutes. The patient's level of consciousness and vital signs were monitored continuously by radiology nursing throughout the procedure under my direct supervision. COMPLICATIONS: None immediate.  TECHNIQUE: Informed written consent was obtained from the patient after a discussion of the risks, benefits and alternatives to treatment. Questions regarding the procedure were encouraged and answered. Initial ultrasound scanning demonstrated multiple enlarged cervical lymph nodes. A dominant approximately 3.2 x 1.6 cm lymph node within the superficial aspect of the mid right lateral neck was targeted for biopsy given lymph node location and sonographic window. An ultrasound image was saved for documentation purposes. The procedure was planned. A timeout was performed prior to the initiation of the procedure. The operative was prepped and draped in the usual sterile fashion, and a sterile drape was applied covering the operative field. A timeout was performed prior to the initiation of the procedure. Local anesthesia was provided with 1% lidocaine with epinephrine. Under direct ultrasound guidance, an 18 gauge core needle device was utilized to obtain to obtain 6 core needle biopsies of the dominant right cervical lymph node. The samples were placed in saline and submitted to pathology. The needle was removed and hemostasis was achieved with manual compression. Post procedure scan was  negative for significant hematoma. A dressing was placed. The patient tolerated the procedure well without immediate postprocedural complication. IMPRESSION: Technically successful ultrasound guided biopsy of dominant right cervical lymph node. Electronically Signed   By: Sandi Mariscal M.D.   On: 11/28/2016 13:48   US Soft Tissue Neck  Result Date: 12/01/2016 CLINICAL DATA:  66 year old male with a history of neck mass, prior biopsy, possible infection EXAM: ULTRASOUND OF HEAD/NECK SOFT TISSUES TECHNIQUE: Ultrasound examination of the head and neck soft tissues was performed in the area of clinical concern. COMPARISON:  Ultrasound 11/28/2016, prior CT 11/27/2016 FINDINGS: Grayscale and color duplex ultrasound formed in the region  of clinical concern. Multiple enlarged lymph nodes again evident, as previously demonstrated on ultrasound and CT imaging. No focal fluid collection. Post biopsy changes of targeted lymph node are evident without fluid collection, gas, or significant edema. IMPRESSION: Ultrasound survey in the region of clinical concern demonstrates no evidence of focal fluid collection or gas. Expected changes of prior biopsy of the targeted enlarged cervical lymph nodes. Electronically Signed   By: Corrie Mckusick D.O.   On: 12/01/2016 12:16    Microbiology: Recent Results (from the past 240 hour(s))  TECHNOLOGIST REVIEW     Status: None   Collection Time: 12/11/16  8:55 AM  Result Value Ref Range Status   Technologist Review   Final    Large Variant lymphs present- few with nucleoli, rare myelocyte seen  Blood Culture (routine x 2)     Status: None (Preliminary result)   Collection Time: 12/11/16  8:12 PM  Result Value Ref Range Status   Specimen Description BLOOD LEFT ANTECUBITAL  Final   Special Requests   Final    BOTTLES DRAWN AEROBIC AND ANAEROBIC Blood Culture adequate volume   Culture   Final    NO GROWTH 2 DAYS Performed at [facility], [address], [place], [place] [address]    Report Status PENDING  Incomplete  Blood Culture (routine x 2)     Status: None (Preliminary result)   Collection Time: 12/11/16  8:17 PM  Result Value Ref Range Status   Specimen Description BLOOD LEFT WRIST  Final   Special Requests   Final    BOTTLES DRAWN AEROBIC AND ANAEROBIC Blood Culture adequate volume   Culture   Final    NO GROWTH 2 DAYS Performed at [facility], [place] [address], [place], [place] [address]    Report Status PENDING  Incomplete  MRSA PCR Screening     Status: Abnormal   Collection Time: 12/12/16 12:54 AM  Result Value Ref Range Status   MRSA by PCR POSITIVE (A) NEGATIVE Final    Comment:        The GeneXpert MRSA Assay (FDA approved for NASAL specimens only), is one  component of a comprehensive MRSA colonization surveillance program. It is not intended to diagnose MRSA infection nor to guide or monitor treatment for MRSA infections. RESULT CALLED TO, READ BACK BY AND VERIFIED WITHVira Blanco RN 0401 12/12/16 A NAVARRO      Labs: Basic Metabolic Panel: Recent Labs  Lab 12/11/16 0855 12/11/16 1816 12/12/16 0148 12/13/16 0318 12/14/16 0515  NA 140 140 138 139 139  K 4.9 5.1 4.2 3.9 4.1  CL  --  110 112* 112* 109  CO2 21* 18* 19* 21* 23  GLUCOSE 90 145* 177* 126* 105*  BUN 19.7 24* 22* 23* 18  CREATININE 0.8 1.02 0.75 0.74 0.57*  CALCIUM 8.9  8.1* 7.4* 7.8* 7.9*  MG  --   --   --  2.1  --    Liver Function Tests: Recent Labs  Lab 12/11/16 0855 12/13/16 0318 12/14/16 0515  AST 41* 43* 27  ALT 18 15* 14*  ALKPHOS 124 85 68  BILITOT 2.31* 1.6* 1.3*  PROT 6.2* 5.2* 5.1*  ALBUMIN 2.2* 2.0* 2.0*   No results for input(s): LIPASE, AMYLASE in the last 168 hours. No results for input(s): AMMONIA in the last 168 hours. CBC: Recent Labs  Lab 12/11/16 0855 12/11/16 1816 12/12/16 0148 12/13/16 0318 12/14/16 0515  WBC 6.8 4.7 8.0 7.4 6.0  NEUTROABS 4.4 3.7  --  6.1 4.4  HGB 10.0* 8.9* 8.1* 8.5* 8.2*  HCT 31.2* 29.4* 25.5* 26.6* 25.9*  MCV 88.2 92.5 90.4 89.6 90.9  PLT 178 111* 87* 101* 99*   Cardiac Enzymes: Recent Labs  Lab 12/11/16 2253 12/12/16 0148 12/12/16 1005  TROPONINI 0.06* 0.05* <0.03   BNP: BNP (last 3 results) No results for input(s): BNP in the last 8760 hours.  ProBNP (last 3 results) No results for input(s): PROBNP in the last 8760 hours.  CBG: Recent Labs  Lab 12/11/16 1818  GLUCAP 144*       Signed:  Florencia Reasons MD, PhD  [facility] 12/14/2016, 10:59 AM

## 2016-12-16 LAB — CULTURE, BLOOD (ROUTINE X 2)
Culture: NO GROWTH
Culture: NO GROWTH
SPECIAL REQUESTS: ADEQUATE
SPECIAL REQUESTS: ADEQUATE

## 2016-12-17 ENCOUNTER — Other Ambulatory Visit: Payer: Self-pay | Admitting: Oncology

## 2016-12-18 ENCOUNTER — Telehealth: Payer: Self-pay | Admitting: *Deleted

## 2016-12-18 ENCOUNTER — Ambulatory Visit: Payer: Medicare Other

## 2016-12-18 ENCOUNTER — Ambulatory Visit: Payer: Medicare Other | Admitting: Oncology

## 2016-12-18 ENCOUNTER — Telehealth: Payer: Self-pay | Admitting: Oncology

## 2016-12-18 ENCOUNTER — Other Ambulatory Visit: Payer: Medicare Other

## 2016-12-18 DIAGNOSIS — A419 Sepsis, unspecified organism: Secondary | ICD-10-CM | POA: Diagnosis not present

## 2016-12-18 DIAGNOSIS — I4891 Unspecified atrial fibrillation: Secondary | ICD-10-CM | POA: Diagnosis not present

## 2016-12-18 DIAGNOSIS — K219 Gastro-esophageal reflux disease without esophagitis: Secondary | ICD-10-CM | POA: Diagnosis not present

## 2016-12-18 DIAGNOSIS — C8590 Non-Hodgkin lymphoma, unspecified, unspecified site: Secondary | ICD-10-CM | POA: Diagnosis not present

## 2016-12-18 DIAGNOSIS — D649 Anemia, unspecified: Secondary | ICD-10-CM | POA: Diagnosis not present

## 2016-12-18 DIAGNOSIS — I10 Essential (primary) hypertension: Secondary | ICD-10-CM | POA: Diagnosis not present

## 2016-12-18 NOTE — Telephone Encounter (Signed)
Scheduled appts per 12/3 sch msg in the infusion area per [facility]. Mary called Val and Val said she would contact the patient regarding the appts that were added.

## 2016-12-18 NOTE — Telephone Encounter (Signed)
This RN contacted pt's Chief Executive Officer at [facility] to verify they were aware of pt's appointments for today. Elmyra Ricks states no notification was sent per above.  Arrangements made for transportation tomorrow for treatment room at 730 AM  Requested lab to be obtained with IV start and to contact MD who can see pt in chemo room at [facility] verified transportation can be obtained for above.  URGENT los sent to scheduling.

## 2016-12-19 ENCOUNTER — Other Ambulatory Visit: Payer: Self-pay | Admitting: *Deleted

## 2016-12-19 ENCOUNTER — Ambulatory Visit (HOSPITAL_BASED_OUTPATIENT_CLINIC_OR_DEPARTMENT_OTHER): Payer: Medicare Other

## 2016-12-19 ENCOUNTER — Ambulatory Visit (HOSPITAL_BASED_OUTPATIENT_CLINIC_OR_DEPARTMENT_OTHER): Payer: Medicare Other | Admitting: Oncology

## 2016-12-19 ENCOUNTER — Other Ambulatory Visit (HOSPITAL_BASED_OUTPATIENT_CLINIC_OR_DEPARTMENT_OTHER): Payer: Medicare Other

## 2016-12-19 ENCOUNTER — Other Ambulatory Visit: Payer: Self-pay | Admitting: Oncology

## 2016-12-19 ENCOUNTER — Telehealth: Payer: Self-pay | Admitting: *Deleted

## 2016-12-19 VITALS — BP 117/64 | HR 95 | Temp 98.4°F | Resp 17

## 2016-12-19 DIAGNOSIS — D649 Anemia, unspecified: Secondary | ICD-10-CM | POA: Diagnosis not present

## 2016-12-19 DIAGNOSIS — I4891 Unspecified atrial fibrillation: Secondary | ICD-10-CM | POA: Diagnosis not present

## 2016-12-19 DIAGNOSIS — C8310 Mantle cell lymphoma, unspecified site: Secondary | ICD-10-CM

## 2016-12-19 DIAGNOSIS — R531 Weakness: Secondary | ICD-10-CM | POA: Diagnosis not present

## 2016-12-19 DIAGNOSIS — C8311 Mantle cell lymphoma, lymph nodes of head, face, and neck: Secondary | ICD-10-CM | POA: Diagnosis not present

## 2016-12-19 DIAGNOSIS — Z5112 Encounter for antineoplastic immunotherapy: Secondary | ICD-10-CM | POA: Diagnosis not present

## 2016-12-19 LAB — COMPREHENSIVE METABOLIC PANEL
ALT: 10 U/L (ref 0–55)
AST: 25 U/L (ref 5–34)
Albumin: 2.2 g/dL — ABNORMAL LOW (ref 3.5–5.0)
Alkaline Phosphatase: 97 U/L (ref 40–150)
Anion Gap: 11 mEq/L (ref 3–11)
BILIRUBIN TOTAL: 1.97 mg/dL — AB (ref 0.20–1.20)
BUN: 23.4 mg/dL (ref 7.0–26.0)
CO2: 22 meq/L (ref 22–29)
Calcium: 8.7 mg/dL (ref 8.4–10.4)
Chloride: 112 mEq/L — ABNORMAL HIGH (ref 98–109)
Creatinine: 0.8 mg/dL (ref 0.7–1.3)
GLUCOSE: 110 mg/dL (ref 70–140)
Potassium: 4.4 mEq/L (ref 3.5–5.1)
SODIUM: 145 meq/L (ref 136–145)
TOTAL PROTEIN: 5.8 g/dL — AB (ref 6.4–8.3)

## 2016-12-19 LAB — CBC WITH DIFFERENTIAL/PLATELET
BASO%: 1.5 % (ref 0.0–2.0)
BASOS ABS: 0.1 10*3/uL (ref 0.0–0.1)
EOS%: 0.2 % (ref 0.0–7.0)
Eosinophils Absolute: 0 10*3/uL (ref 0.0–0.5)
HEMATOCRIT: 27.8 % — AB (ref 38.4–49.9)
HEMOGLOBIN: 8.7 g/dL — AB (ref 13.0–17.1)
LYMPH#: 0.9 10*3/uL (ref 0.9–3.3)
LYMPH%: 17.5 % (ref 14.0–49.0)
MCH: 28.4 pg (ref 27.2–33.4)
MCHC: 31.2 g/dL — ABNORMAL LOW (ref 32.0–36.0)
MCV: 90.8 fL (ref 79.3–98.0)
MONO#: 0.9 10*3/uL (ref 0.1–0.9)
MONO%: 16.4 % — ABNORMAL HIGH (ref 0.0–14.0)
NEUT#: 3.4 10*3/uL (ref 1.5–6.5)
NEUT%: 64.4 % (ref 39.0–75.0)
Platelets: 106 10*3/uL — ABNORMAL LOW (ref 140–400)
RBC: 3.06 10*6/uL — ABNORMAL LOW (ref 4.20–5.82)
RDW: 19.8 % — ABNORMAL HIGH (ref 11.0–14.6)
WBC: 5.3 10*3/uL (ref 4.0–10.3)
nRBC: 1 % — ABNORMAL HIGH (ref 0–0)

## 2016-12-19 LAB — TECHNOLOGIST REVIEW: Technologist Review: 2

## 2016-12-19 MED ORDER — DIPHENHYDRAMINE HCL 25 MG PO CAPS
ORAL_CAPSULE | ORAL | Status: AC
Start: 2016-12-19 — End: ?
  Filled 2016-12-19: qty 1

## 2016-12-19 MED ORDER — ACETAMINOPHEN 325 MG PO TABS
650.0000 mg | ORAL_TABLET | Freq: Once | ORAL | Status: AC
  Administered 2016-12-19: 650 mg via ORAL

## 2016-12-19 MED ORDER — RITUXIMAB CHEMO INJECTION 500 MG/50ML
100.0000 mg | Freq: Once | INTRAVENOUS | Status: AC
  Administered 2016-12-19: 100 mg via INTRAVENOUS
  Filled 2016-12-19: qty 10

## 2016-12-19 MED ORDER — FAMOTIDINE IN NACL 20-0.9 MG/50ML-% IV SOLN
INTRAVENOUS | Status: AC
  Filled 2016-12-19: qty 50

## 2016-12-19 MED ORDER — LORAZEPAM 1 MG PO TABS
0.5000 mg | ORAL_TABLET | Freq: Once | ORAL | Status: AC
  Administered 2016-12-19: 0.5 mg via ORAL

## 2016-12-19 MED ORDER — ACETAMINOPHEN 325 MG PO TABS
ORAL_TABLET | ORAL | Status: AC
  Filled 2016-12-19: qty 2

## 2016-12-19 MED ORDER — DEXAMETHASONE 4 MG PO TABS: 20.0000 mg | ORAL_TABLET | Freq: Once | ORAL | Status: DC

## 2016-12-19 MED ORDER — DIPHENHYDRAMINE HCL 25 MG PO CAPS
25.0000 mg | ORAL_CAPSULE | Freq: Once | ORAL | Status: AC
  Administered 2016-12-19: 25 mg via ORAL

## 2016-12-19 MED ORDER — SODIUM CHLORIDE 0.9 % IV SOLN
Freq: Once | INTRAVENOUS | Status: AC
  Administered 2016-12-19: 12:00:00 via INTRAVENOUS

## 2016-12-19 MED ORDER — LORAZEPAM 1 MG PO TABS
ORAL_TABLET | ORAL | Status: AC
  Filled 2016-12-19: qty 1

## 2016-12-19 MED ORDER — DEXAMETHASONE 4 MG PO TABS
ORAL_TABLET | ORAL | Status: AC
  Filled 2016-12-19: qty 5

## 2016-12-19 MED ORDER — FAMOTIDINE IN NACL 20-0.9 MG/50ML-% IV SOLN
20.0000 mg | Freq: Two times a day (BID) | INTRAVENOUS | Status: DC
  Administered 2016-12-19: 20 mg via INTRAVENOUS

## 2016-12-19 MED ORDER — SODIUM CHLORIDE 0.9 % IV SOLN
20.0000 mg | Freq: Once | INTRAVENOUS | Status: AC
  Administered 2016-12-19: 20 mg via INTRAVENOUS
  Filled 2016-12-19: qty 2

## 2016-12-19 NOTE — Patient Instructions (Signed)
[facility] Discharge Instructions for Patients Receiving Chemotherapy  Today you received the following chemotherapy agents Rituxan  To help prevent nausea and vomiting after your treatment, we encourage you to take your nausea medication as prescribed by MD.    If you develop nausea and vomiting that is not controlled by your nausea medication, call the clinic.   BELOW ARE SYMPTOMS THAT SHOULD BE REPORTED IMMEDIATELY:  *FEVER GREATER THAN 100.5 F  *CHILLS WITH OR WITHOUT FEVER  NAUSEA AND VOMITING THAT IS NOT CONTROLLED WITH YOUR NAUSEA MEDICATION  *UNUSUAL SHORTNESS OF BREATH  *UNUSUAL BRUISING OR BLEEDING  TENDERNESS IN MOUTH AND THROAT WITH OR WITHOUT PRESENCE OF ULCERS  *URINARY PROBLEMS  *BOWEL PROBLEMS  UNUSUAL RASH Items with * indicate a potential emergency and should be followed up as soon as possible.  Feel free to call the clinic should you have any questions or concerns. The clinic phone number is [phone].  Please show the CHEMO ALERT CARD at check-in to the Emergency Department and triage nurse.  Rituximab injection What is this medicine? RITUXIMAB (ri TUX i mab) is a monoclonal antibody. It is used to treat certain types of cancer like non-Hodgkin lymphoma and chronic lymphocytic leukemia. It is also used to treat rheumatoid arthritis, granulomatosis with polyangiitis (or Wegener's granulomatosis), and microscopic polyangiitis. This medicine may be used for other purposes; ask your health care provider or pharmacist if you have questions. COMMON BRAND NAME(S): Rituxan What should I tell my health care provider before I take this medicine? They need to know if you have any of these conditions: -heart disease -infection (especially a virus infection such as hepatitis B, chickenpox, cold sores, or herpes) -immune system problems -irregular heartbeat -kidney disease -lung or breathing disease, like asthma -recently received or scheduled  to receive a vaccine -an unusual or allergic reaction to rituximab, mouse proteins, other medicines, foods, dyes, or preservatives -pregnant or trying to get pregnant -breast-feeding How should I use this medicine? This medicine is for infusion into a vein. It is administered in a hospital or clinic by a specially trained health care professional. A special MedGuide will be given to you by the pharmacist with each prescription and refill. Be sure to read this information carefully each time. Talk to your pediatrician regarding the use of this medicine in children. This medicine is not approved for use in children. Overdosage: If you think you have taken too much of this medicine contact a poison control center or emergency room at once. NOTE: This medicine is only for you. Do not share this medicine with others. What if I miss a dose? It is important not to miss a dose. Call your doctor or health care professional if you are unable to keep an appointment. What may interact with this medicine? -cisplatin -other medicines for arthritis like disease modifying antirheumatic drugs or tumor necrosis factor inhibitors -live virus vaccines This list may not describe all possible interactions. Give your health care provider a list of all the medicines, herbs, non-prescription drugs, or dietary supplements you use. Also tell them if you smoke, drink alcohol, or use illegal drugs. Some items may interact with your medicine. What should I watch for while using this medicine? Your condition will be monitored carefully while you are receiving this medicine. You may need blood work done while you are taking this medicine. This medicine can cause serious allergic reactions. To reduce your risk you may need to take medicine before treatment with this medicine.   Take your medicine as directed. In some patients, this medicine may cause a serious brain infection that may cause death. If you have any problems seeing,  thinking, speaking, walking, or standing, tell your doctor right away. If you cannot reach your doctor, urgently seek other source of medical care. Call your doctor or health care professional for advice if you get a fever, chills or sore throat, or other symptoms of a cold or flu. Do not treat yourself. This drug decreases your body's ability to fight infections. Try to avoid being around people who are sick. Do not become pregnant while taking this medicine or for 12 months after stopping it. Women should inform their doctor if they wish to become pregnant or think they might be pregnant. There is a potential for serious side effects to an unborn child. Talk to your health care professional or pharmacist for more information. What side effects may I notice from receiving this medicine? Side effects that you should report to your doctor or health care professional as soon as possible: -breathing problems -chest pain -dizziness or feeling faint -fast, irregular heartbeat -low blood counts - this medicine may decrease the number of white blood cells, red blood cells and platelets. You may be at increased risk for infections and bleeding. -mouth sores -redness, blistering, peeling or loosening of the skin, including inside the mouth (this can be added for any serious or exfoliative rash that could lead to hospitalization) -signs of infection - fever or chills, cough, sore throat, pain or difficulty passing urine -signs and symptoms of kidney injury like trouble passing urine or change in the amount of urine -signs and symptoms of liver injury like dark yellow or brown urine; general ill feeling or flu-like symptoms; light-colored stools; loss of appetite; nausea; right upper belly pain; unusually weak or tired; yellowing of the eyes or skin -stomach pain -vomiting Side effects that usually do not require medical attention (report to your doctor or health care professional if they continue or are  bothersome): -headache -joint pain -muscle cramps or muscle pain This list may not describe all possible side effects. Call your doctor for medical advice about side effects. You may report side effects to FDA at 1-800-FDA-1088. Where should I keep my medicine? This drug is given in a hospital or clinic and will not be stored at home. NOTE: This sheet is a summary. It may not cover all possible information. If you have questions about this medicine, talk to your doctor, pharmacist, or health care provider.  2018 [employer]/Gold Standard (2015-08-08 15:28:09)

## 2016-12-19 NOTE — Telephone Encounter (Signed)
Proceed with treatment today per labs obtained 12/14/2016.  May maintain Rituxin rate at low infusion rate to avoid any potential reaction.

## 2016-12-19 NOTE — Progress Notes (Signed)
[place]  [phone] [phone]     ID: [name] [name] DOB: 1951-01-13  MR#: [id number]  [facility]#:[id number]  Patient Care Team: Wenda Low, MD as PCP - General (Internal Medicine) Obryan Radu, [name], MD as Consulting Physician (Oncology) Chauncey Cruel, MD OTHER MD:  CHIEF COMPLAINT: mantle Cell lymphoma  CURRENT TREATMENT: Rituximab weekly   HISTORY OF CURRENT ILLNESS: From the original intake note:  [name] [name] presented to the hospital on 11/27/16 as he presented to IR as outpatient for elective biopsy of right neck mass, and was found to have Atrial fibrillation with RVR and was sent to ED.  His cardiac issues stabilized, and he underwent bone marrow biopsy for further work up of his mantle cell lymphoma.  He was started on Prednisone, and discharged in stable condition back to his nursing home with f/u here to start on Rituximab.    The patient's subsequent history is as detailed below.  INTERVAL HISTORY: We were able to work [name] [name] in today for treatment with the help of our nursing staff.  However when he arrived there was no appointment and instead of inquiring with [place] he was sent back to his SNF.  We had placed a safety zone portal regarding that.  We hired a taxi to bring him back.  He is now receiving his second dose of right toxin.  We are limiting this to 100 mg today given his severe reaction last time.  REVIEW OF SYSTEMS: [name] [name] tells me his legs are still weak.  He really cannot walk.  He denies shortness of breath or chest pain or pressure, but of course he has not been very active.  He is not aware of any bleeding.  He is having regular bowel movements.  They are not black or tarry or bloody.  There has not been any epistaxis or hematuria.  He denies fevers, drenching sweats, or problems with his appetite or sense of taste.  A detailed review of systems today was otherwise stable  PAST MEDICAL HISTORY: Past Medical History:    Diagnosis Date  . Bladder tumor   . Cataract   . CVA (cerebral infarction)    2008  . Hyperlipidemia   . Hypertension     PAST SURGICAL HISTORY: Past Surgical History:  Procedure Laterality Date  . CHOLECYSTECTOMY    . EYE SURGERY    . FLEXIBLE SIGMOIDOSCOPY N/A 11/30/2013   Procedure: FLEXIBLE SIGMOIDOSCOPY;  Surgeon: Inda Castle, MD;  Location: [facility];  Service: Endoscopy;  Laterality: N/A;  . HOT HEMOSTASIS N/A 11/30/2013   Procedure: HOT HEMOSTASIS (ARGON PLASMA COAGULATION/BICAP);  Surgeon: Inda Castle, MD;  Location: [facility];  Service: Endoscopy;  Laterality: N/A;  . MOUTH SURGERY      FAMILY HISTORY Family History  Problem Relation Age of Onset  . Colon cancer Father   . Colon cancer Sister 21  . Hypertension Sister   The patient's father died from rectal cancer at the age of 63.  The patient's mother died from some type of cancer, the patient does not know what type, at age 64.  The patient had 2 sisters, 1 of whom died at age 19 with cancer of the rectum.  The patient had 1 brother, who also died from.  Again he is not sure what type.  SOCIAL HISTORY:  Lives at [place].  He has lived there since 2008, following a left body stroke he is single, with no children.  ADVANCED DIRECTIVES: Mr. [name] [name] is not sure whether or not he has a healthcare power of attorney filed.  He does not recall who he may have named   HEALTH MAINTENANCE: Social History   Tobacco Use  . Smoking status: Former Smoker    Last attempt to quit: 09/15/1985    Years since quitting: 31.2  . Smokeless tobacco: Never Used  . Tobacco comment: Started at age 27, quit in the 1980's  Substance Use Topics  . Alcohol use: No  . Drug use: No     Colonoscopy:  PAP:  Bone density:   No Known Allergies  Current Outpatient Medications  Medication Sig Dispense Refill  . acetaminophen (TYLENOL) 650 MG CR tablet Take 650 mg every 8 (eight) hours as  needed by mouth (for pain or fever ("for 48 hours")).    Marland Kitchen allopurinol (ZYLOPRIM) 300 MG tablet Take 1 tablet (300 mg total) by mouth daily.    Marland Kitchen amiodarone (PACERONE) 200 MG tablet Take 2 tablets (400 mg total) by mouth 2 (two) times daily for 4 days, THEN 1 tablet (200 mg total) daily. (Patient taking differently: Take 1 Tablet (200 mg) by mouth daily)    . aspirin 81 MG chewable tablet Chew 1 tablet (81 mg total) by mouth daily.    . Chlorhexidine Gluconate Cloth 2 % PADS Apply 6 each topically daily at 6 (six) AM. 6 each 0  . Cholecalciferol (VITAMIN D-3) 1000 units CAPS Take 1,000 Units by mouth daily.     Marland Kitchen diltiazem (CARDIZEM CD) 240 MG 24 hr capsule Take 1 capsule (240 mg total) by mouth daily.    Marland Kitchen docusate sodium (COLACE) 100 MG capsule Take 100 mg by mouth 2 (two) times daily. For constipation    . ferrous sulfate 325 (65 FE) MG tablet Take 325 mg by mouth daily with breakfast.     . folic acid (FOLVITE) 1 MG tablet Take 1 tablet (1 mg total) by mouth daily. 30 tablet 0  . ibuprofen (ADVIL,MOTRIN) 400 MG tablet Take 400 mg by mouth every 6 (six) hours as needed for moderate pain.     Marland Kitchen lisinopril (ZESTRIL) 2.5 MG tablet Take 1 tablet (2.5 mg total) by mouth daily. 30 tablet 11  . loratadine (CLARITIN) 10 MG tablet Take 10 mg daily by mouth.    . metoprolol tartrate (LOPRESSOR) 50 MG tablet Take 1 tablet (50 mg total) by mouth 2 (two) times daily.    . mupirocin ointment (BACTROBAN) 2 % Place 1 application into the nose 2 (two) times daily. 22 g 0  . omeprazole (PRILOSEC) 20 MG capsule Take 1 capsule by mouth daily.    . predniSONE (DELTASONE) 20 MG tablet Take 3 tablets (60 mg total) by mouth daily with breakfast for 4 days. 12 tablet 0  . simvastatin (ZOCOR) 40 MG tablet Take 40 mg by mouth at bedtime.     No current facility-administered medications for this visit.    Facility-Administered Medications Ordered in Other Visits  Medication Dose Route Frequency Provider Last Rate Last  Dose  . famotidine (PEPCID) IVPB 20 mg premix  20 mg Intravenous Q12H Tanner, Lucianne Lei E., PA-C   20 mg at 12/11/16 1725  . famotidine (PEPCID) IVPB 20 mg premix  20 mg Intravenous Q12H Tiaria Biby, [name], MD   20 mg at 12/19/16 1150    OBJECTIVE: Middle-aged white man examined in bed  For today's vitals please refer to the infusion area flow sheet  There were no vitals  filed for this visit.   There is no height or weight on file to calculate BMI.   Wt Readings from Last 3 Encounters:  12/13/16 214 lb 4.6 oz (97.2 kg)  12/11/16 190 lb 3.2 oz (86.3 kg)  12/06/16 196 lb 12.8 oz (89.3 kg)      ECOG FS:3 - Symptomatic, >50% confined to bed   Sclerae unicteric The right cervical adenopathy appears softer and possibly smaller Lungs no rales or rhonchi auscultated anterolaterally Heart regular rate and rhythm Abd soft, nontender, positive bowel sounds MSK very large right upper forearm as previously noted Neuro: well oriented, appropriate affect   LAB RESULTS:  CMP     Component Value Date/Time   NA 145 12/19/2016 1135   K 4.4 12/19/2016 1135   CL 109 12/14/2016 0515   CO2 22 12/19/2016 1135   GLUCOSE 110 12/19/2016 1135   BUN 23.4 12/19/2016 1135   CREATININE 0.8 12/19/2016 1135   CALCIUM 8.7 12/19/2016 1135   PROT 5.8 (L) 12/19/2016 1135   ALBUMIN 2.2 (L) 12/19/2016 1135   AST 25 12/19/2016 1135   ALT 10 12/19/2016 1135   ALKPHOS 97 12/19/2016 1135   BILITOT 1.97 (H) 12/19/2016 1135   GFRNONAA >60 12/14/2016 0515   GFRAA >60 12/14/2016 0515    No results found for: TOTALPROTELP, ALBUMINELP, A1GS, A2GS, BETS, BETA2SER, GAMS, MSPIKE, SPEI  No results found for: Nils Pyle, KAPLAMBRATIO  Lab Results  Component Value Date   WBC 5.3 12/19/2016   NEUTROABS 3.4 12/19/2016   HGB 8.7 (L) 12/19/2016   HCT 27.8 (L) 12/19/2016   MCV 90.8 12/19/2016   PLT 106 (L) 12/19/2016      Chemistry      Component Value Date/Time   NA 145 12/19/2016 1135   K 4.4  12/19/2016 1135   CL 109 12/14/2016 0515   CO2 22 12/19/2016 1135   BUN 23.4 12/19/2016 1135   CREATININE 0.8 12/19/2016 1135      Component Value Date/Time   CALCIUM 8.7 12/19/2016 1135   ALKPHOS 97 12/19/2016 1135   AST 25 12/19/2016 1135   ALT 10 12/19/2016 1135   BILITOT 1.97 (H) 12/19/2016 1135       No results found for: LABCA2  No components found for: MGNOIB704  No results for input(s): INR in the last 168 hours.  No results found for: LABCA2  No results found for: UGQ916  No results found for: XIH038  No results found for: UEK800  No results found for: CA2729  No components found for: HGQUANT  No results found for: CEA1 / No results found for: CEA1   No results found for: AFPTUMOR  No results found for: CHROMOGRNA  No results found for: PSA1  Appointment on 12/19/2016  Component Date Value Ref Range Status  . Sodium 12/19/2016 145  136 - 145 mEq/L Final  . Potassium 12/19/2016 4.4  3.5 - 5.1 mEq/L Final  . Chloride 12/19/2016 112* 98 - 109 mEq/L Final  . CO2 12/19/2016 22  22 - 29 mEq/L Final  . Glucose 12/19/2016 110  70 - 140 mg/dl Final   Glucose reference range is for nonfasting patients. Fasting glucose reference range is 70- 100.  Marland Kitchen BUN 12/19/2016 23.4  7.0 - 26.0 mg/dL Final  . Creatinine 12/19/2016 0.8  0.7 - 1.3 mg/dL Final  . Total Bilirubin 12/19/2016 1.97* 0.20 - 1.20 mg/dL Final  . Alkaline Phosphatase 12/19/2016 97  40 - 150 U/L Final  . AST 12/19/2016 25  5 - 34 U/L Final  . ALT 12/19/2016 10  0 - 55 U/L Final  . Total Protein 12/19/2016 5.8* 6.4 - 8.3 g/dL Final  . Albumin 12/19/2016 2.2* 3.5 - 5.0 g/dL Final  . Calcium 12/19/2016 8.7  8.4 - 10.4 mg/dL Final  . Anion Gap 12/19/2016 11  3 - 11 mEq/L Final  . EGFR 12/19/2016 >60  >60 ml/min/1.73 m2 Final   eGFR is calculated using the CKD-EPI Creatinine Equation (2009)  . WBC 12/19/2016 5.3  4.0 - 10.3 10e3/uL Final  . NEUT# 12/19/2016 3.4  1.5 - 6.5 10e3/uL Final  . HGB  12/19/2016 8.7* 13.0 - 17.1 g/dL Final  . HCT 12/19/2016 27.8* 38.4 - 49.9 % Final  . Platelets 12/19/2016 106* 140 - 400 10e3/uL Final  . MCV 12/19/2016 90.8  79.3 - 98.0 fL Final  . MCH 12/19/2016 28.4  27.2 - 33.4 pg Final  . MCHC 12/19/2016 31.2* 32.0 - 36.0 g/dL Final  . RBC 12/19/2016 3.06* 4.20 - 5.82 10e6/uL Final  . RDW 12/19/2016 19.8* 11.0 - 14.6 % Final  . lymph# 12/19/2016 0.9  0.9 - 3.3 10e3/uL Final  . MONO# 12/19/2016 0.9  0.1 - 0.9 10e3/uL Final  . Eosinophils Absolute 12/19/2016 0.0  0.0 - 0.5 10e3/uL Final  . Basophils Absolute 12/19/2016 0.1  0.0 - 0.1 10e3/uL Final  . NEUT% 12/19/2016 64.4  39.0 - 75.0 % Final  . LYMPH% 12/19/2016 17.5  14.0 - 49.0 % Final  . MONO% 12/19/2016 16.4* 0.0 - 14.0 % Final  . EOS% 12/19/2016 0.2  0.0 - 7.0 % Final  . BASO% 12/19/2016 1.5  0.0 - 2.0 % Final  . nRBC 12/19/2016 1* 0 - 0 % Final  . Technologist Review 12/19/2016 2% myelo and varient lymphs seen   Final    (this displays the last labs from the last 3 days)  No results found for: TOTALPROTELP, ALBUMINELP, A1GS, A2GS, BETS, BETA2SER, GAMS, MSPIKE, SPEI (this displays SPEP labs)  No results found for: KPAFRELGTCHN, LAMBDASER, KAPLAMBRATIO (kappa/lambda light chains)  No results found for: HGBA, HGBA2QUANT, HGBFQUANT, HGBSQUAN (Hemoglobinopathy evaluation)   Lab Results  Component Value Date   LDH 226 (H) 12/13/2016    Lab Results  Component Value Date   IRON 15 (L) 11/28/2016   TIBC 190 (L) 11/28/2016   IRONPCTSAT 8 (L) 11/28/2016   (Iron and TIBC)  Lab Results  Component Value Date   FERRITIN 392 (H) 11/28/2016    Urinalysis    Component Value Date/Time   COLORURINE AMBER (A) 12/11/2016 2051   APPEARANCEUR HAZY (A) 12/11/2016 2051   LABSPEC 1.023 12/11/2016 2051   [place] 6.0 12/11/2016 2051   [place] 12/11/2016 2051   HGBUR NEGATIVE 12/11/2016 2051   BILIRUBINUR SMALL (A) 12/11/2016 2051   KETONESUR 5 (A) 12/11/2016 2051   PROTEINUR 100  (A) 12/11/2016 2051   NITRITE NEGATIVE 12/11/2016 2051   LEUKOCYTESUR NEGATIVE 12/11/2016 2051     STUDIES: Dg Chest 2 View  Result Date: 11/30/2016 CLINICAL DATA:  Fever today. EXAM: CHEST  2 VIEW COMPARISON:  CT chest 11/27/2016.  Chest radiograph 11/27/2016. FINDINGS: Mild cardiac enlargement. No vascular congestion. No airspace disease or consolidation in the lungs. No blunting of costophrenic angles. No pneumothorax. Calcification of the aorta. Prominent lymphadenopathy demonstrated at CT is not well visualized radiographically. IMPRESSION: Cardiac enlargement.  No evidence of active pulmonary disease. Electronically Signed   By: Lucienne Capers M.D.   On: 11/30/2016 23:36   Ct Soft  Tissue Neck Wo Contrast  Result Date: 11/21/2016 CLINICAL DATA:  Initial evaluation for right-sided neck swelling. EXAM: CT NECK WITHOUT CONTRAST TECHNIQUE: Multidetector CT imaging of the neck was performed following the standard protocol without intravenous contrast. COMPARISON:  None available. FINDINGS: Pharynx and larynx: Oral cavity within normal limits without mass lesion or loculated fluid collection. Patient is edentulous. Palatine tonsils symmetric and within normal limits bilaterally. Few small calcified tonsilliths noted. Parapharyngeal fat preserved. Nasopharynx within normal limits. Retropharyngeal soft tissues demonstrate no acute abnormality. Epiglottis normal. Vallecula clear. Small amount of layering secretions present within the right hypopharynx, extending into the right piriform sinus. Remainder of the hypopharynx and supraglottic larynx grossly normal. True cords symmetric and within normal limits. Subglottic airway clear. Salivary glands: Salivary glands including the parotid and submandibular glands are within normal limits bilaterally. Thyroid: Thyroid within normal limits. Lymph nodes: Extensive bulky adenopathy seen throughout the neck and visualized upper chest, slightly worse on the right  as compared to the left. Nodes are seen at essentially all levels, most prevalent within the right supraclavicular and axillary region. For reference purposes, largest discrete node within the right supraclavicular region measures approximately 2.6 x 5.1 cm (series 2, image 85). Largest discrete nodal mass within the right axillary region measures approximately 9.4 x 3.7 cm (series 2, image 114). In the left neck, largest discrete node seen at left level III and measures 1.8 x 2.7 cm (series 2, image 71). Largest node within the left upper chest seen within the left subpectoral region and measures 5.4 x 2.9 cm (series 2, image 106). Study nodes seen within the partially visualized upper mediastinum as well, largest of which measures 1 cm in the prevascular region. Findings highly concerning for lymphoproliferative disorder/ lymphoma. Vascular: Atherosclerotic changes noted about the aortic arch and carotid bifurcations. Limited intracranial: Unremarkable. Visualized orbits: Partially visualized globes and orbital soft tissues within normal limits. Mastoids and visualized paranasal sinuses: Chronic maxillary sinusitis noted, right worse than left. Retained metallic density at the posterior right maxillary sinus. Sequelae of prior ORIF at the anterior maxillary sinuses/alveolar ridge. Visualize mastoids and middle ear cavities are clear. Skeleton: No acute osseus abnormality. No worrisome lytic or blastic osseous lesions. Straightening of the normal cervical lordosis with trace retrolisthesis of C3 on C4. Moderate to advanced degenerate spondylolysis at C5-6. Upper chest: Partially visualized lungs are clear. Other: None. IMPRESSION: 1. Extensive bulky adenopathy throughout the neck and visualized upper chest, right worse than left, highly concerning for lymphoproliferative disorder/lymphoma. 2. No other acute abnormality within the neck. 3. Chronic maxillary sinusitis. Electronically Signed   By: Jeannine Boga  M.D.   On: 11/21/2016 15:27   Ct Angio Chest Pe W And/or Wo Contrast  Result Date: 11/27/2016 CLINICAL DATA:  Tachycardia. Suspect pulmonary embolism. History of hypertension, ex smoker, bladder tumor. Scheduled for neck biopsy today. EXAM: CT ANGIOGRAPHY CHEST WITH CONTRAST TECHNIQUE: Multidetector CT imaging of the chest was performed using the standard protocol during bolus administration of intravenous contrast. Multiplanar CT image reconstructions and MIPs were obtained to evaluate the vascular anatomy. CONTRAST:  124m ISOVUE-370 IOPAMIDOL (ISOVUE-370) INJECTION 76% COMPARISON:  CT neck November 21, 2016 and chest radiograph November 27, 2016 and CT abdomen and pelvis June 22, 2012 FINDINGS: CARDIOVASCULAR: Adequate contrast opacification of the pulmonary artery's. Main pulmonary artery is not enlarged. No pulmonary arterial filling defects to the level of the segmental branches, distal branches obscured by respiratory motion. Heart is upper limits of normal in size. Mild coronary artery calcifications.  Small pericardial effusion. Thoracic aorta is normal course and caliber, unremarkable. MEDIASTINUM/NODES: Greater than expected number of mildly enlarged mediastinal and hilar lymph nodes. Anterior mediastinal fat stranding. Bulky RIGHT greater than LEFT axillary lymphadenopathy, 12.9 x 6.7 cm RIGHT axillary nodal conglomeration. Bulky RIGHT grand LEFT supraclavicular lymphadenopathy. LUNGS/PLEURA: Tracheobronchial tree is patent, no pneumothorax. No pleural effusions, focal consolidations, pulmonary nodules or masses. UPPER ABDOMEN: New splenomegaly. 6.7 cm cyst (5 Hounsfield units) LEFT renal fossa, larger than prior CT. Stable 3 cm cyst (3 Hounsfield units) RIGHT lobe of the liver. Partially imaged probable portal caval lymphadenopathy. MUSCULOSKELETAL: Nonacute. RIGHT biceps intramuscular lipoma versus atrophy. Multilevel scattered Schmorl's nodes. Review of the MIP images confirms the above findings.  IMPRESSION: 1. No acute pulmonary embolism. 2. Bulky supraclavicular, axillary and possibly portal caval lymphadenopathy. 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 3. Borderline cardiomegaly.  No acute pulmonary process. Aortic Atherosclerosis (ICD10-I70.0). Electronically Signed   By: Elon Alas M.D.   On: 11/27/2016 17:13   Ct Humerus Right Wo Contrast  Result Date: 12/13/2016 CLINICAL DATA:  Right upper arm mass.  History mantle cell lymphoma. EXAM: CT OF THE RIGHT HUMERUS WITHOUT CONTRAST TECHNIQUE: Multidetector CT imaging was performed according to the standard protocol. Multiplanar CT image reconstructions were also generated. COMPARISON:  CT chest dated November 27, 2016. FINDINGS: Bones/Joint/Cartilage No acute fracture or malalignment. Mild degenerative changes of the acromioclavicular and glenohumeral joints. Osteopenia. Ligaments Suboptimally assessed by CT. Muscles and Tendons Within the right biceps muscle, there is a large predominantly fat density mass measuring 10.2 x 11.6 x 16.5 cm. There is minimal internal stranding without focal nodularity. The rotator cuff is grossly intact. Soft tissues Markedly enlarged right axillary and lower right cervical lymph nodes. Unchanged simple cysts in the liver. IMPRESSION: 1. Large, 16.5 cm predominantly fat density mass within the right biceps muscle, most consistent with a lipoma. Minimal internal stranding without focal nodularity makes liposarcoma unlikely. 2. Markedly enlarged right axillary and lower right cervical lymph nodes, consistent with history of mantle cell lymphoma. Electronically Signed   By: Titus Dubin M.D.   On: 12/13/2016 14:15   US Scrotum  Result Date: 12/13/2016 CLINICAL DATA:  Chronic scrotal edema EXAM: ULTRASOUND OF SCROTUM TECHNIQUE: Complete ultrasound examination of the testicles, epididymis, and other scrotal structures was performed. COMPARISON:  None. FINDINGS: Right  testicle Measurements: 4.0 x 1.9 x 2.5 cm. No mass or microlithiasis visualized. Left testicle Measurements: 3.9 x 2.4 x 2.6 cm. No mass or microlithiasis visualized. Right epididymis:  4 mm cyst in the epididymal head. Left epididymis:  Normal in size and appearance. Hydrocele:  Bilateral hydroceles Varicocele:  None visualized. There is a complex fluid collection/cyst in the left scrotum measuring 8.2 x 6.4 x 6.2 cm. Layering internal debris. This is lateral to the left testicle. Complex cystic area noted in the right inguinal canal with internal septations. IMPRESSION: No testicular abnormality. Complex cystic area within the right inguinal canal containing internal septations of unknown etiology. Large complex cystic area in the left lateral scrotum measuring up to 8.2 cm. This could reflect complex hydrocele or spermatocele. Electronically Signed   By: Rolm Baptise M.D.   On: 12/13/2016 13:27   Dg Chest Portable 1 View  Result Date: 12/11/2016 CLINICAL DATA:  Shortness of breath EXAM: PORTABLE CHEST 1 VIEW COMPARISON:  11/30/2016 FINDINGS: 1756 hours. The cardio pericardial silhouette is enlarged. The lungs are clear without focal pneumonia, edema, pneumothorax or pleural effusion. The visualized bony structures of the  thorax are intact. Telemetry leads overlie the chest. IMPRESSION: Stable.  No acute cardiopulmonary findings. Electronically Signed   By: Misty Stanley M.D.   On: 12/11/2016 18:38   Dg Chest Port 1 View  Result Date: 11/27/2016 CLINICAL DATA:  Atrial fibrillation EXAM: PORTABLE CHEST 1 VIEW COMPARISON:  None. FINDINGS: Cardiac silhouette is upper limits of normal. Mild aortic atherosclerotic calcification. No focal airspace consolidation or pulmonary edema. No pneumothorax or sizable pleural effusion. IMPRESSION: No active disease. Aortic Atherosclerosis (ICD10-I70.0). Electronically Signed   By: Ulyses Jarred M.D.   On: 11/27/2016 15:17   Ct Bone Marrow Biopsy  Result Date:  12/05/2016 INDICATION: 66 year old with mantle cell lymphoma. EXAM: CT GUIDED BONE MARROW ASPIRATES AND BIOPSY Physician: Stephan Minister. Anselm Pancoast, MD MEDICATIONS: None. ANESTHESIA/SEDATION: Fentanyl 50 mcg IV; Versed 1.0 mg IV Moderate Sedation Time:  12 minutes The patient was continuously monitored during the procedure by the interventional radiology nurse under my direct supervision. COMPLICATIONS: None immediate. PROCEDURE: The procedure was explained to the patient. The risks and benefits of the procedure were discussed and the patient's questions were addressed. Informed consent was obtained from the patient. The patient was placed prone on CT scan. Images of the pelvis were obtained. The right side of back was prepped and draped in sterile fashion. The skin and right posterior iliac bone were anesthetized with 1% lidocaine. 11 gauge bone needle was directed into the right iliac bone with CT guidance. Two aspirates and one core biopsy obtained. Bandage placed over the puncture site. FINDINGS: Bone needle directed in the right ilium. Extensive lymphadenopathy throughout the pelvis and inguinal regions. IMPRESSION: CT guided bone marrow aspirates and core biopsy. Electronically Signed   By: Markus Daft M.D.   On: 12/05/2016 10:19   [place] Core Biopsy (lymph Nodes)  Result Date: 11/28/2016 INDICATION: Bulky cervical axillary lymphadenopathy. Please perform ultrasound-guided biopsy for tissue diagnostic purposes. EXAM: ULTRASOUND-GUIDED RIGHT CERVICAL LYMPH NODE BIOPSY COMPARISON:  Neck CT - 11/21/2016; chest CT - 11/17/2016 MEDICATIONS: None ANESTHESIA/SEDATION: Moderate (conscious) sedation was employed during this procedure. A total of Versed 2 mg and Fentanyl 75 mcg was administered intravenously. Moderate Sedation Time: 10 minutes. The patient's level of consciousness and vital signs were monitored continuously by radiology nursing throughout the procedure under my direct supervision. COMPLICATIONS: None immediate.  TECHNIQUE: Informed written consent was obtained from the patient after a discussion of the risks, benefits and alternatives to treatment. Questions regarding the procedure were encouraged and answered. Initial ultrasound scanning demonstrated multiple enlarged cervical lymph nodes. A dominant approximately 3.2 x 1.6 cm lymph node within the superficial aspect of the mid right lateral neck was targeted for biopsy given lymph node location and sonographic window. An ultrasound image was saved for documentation purposes. The procedure was planned. A timeout was performed prior to the initiation of the procedure. The operative was prepped and draped in the usual sterile fashion, and a sterile drape was applied covering the operative field. A timeout was performed prior to the initiation of the procedure. Local anesthesia was provided with 1% lidocaine with epinephrine. Under direct ultrasound guidance, an 18 gauge core needle device was utilized to obtain to obtain 6 core needle biopsies of the dominant right cervical lymph node. The samples were placed in saline and submitted to pathology. The needle was removed and hemostasis was achieved with manual compression. Post procedure scan was negative for significant hematoma. A dressing was placed. The patient tolerated the procedure well without immediate postprocedural complication. IMPRESSION: Technically successful ultrasound  guided biopsy of dominant right cervical lymph node. Electronically Signed   By: Sandi Mariscal M.D.   On: 11/28/2016 13:48   US Soft Tissue Neck  Result Date: 12/01/2016 CLINICAL DATA:  66 year old male with a history of neck mass, prior biopsy, possible infection EXAM: ULTRASOUND OF HEAD/NECK SOFT TISSUES TECHNIQUE: Ultrasound examination of the head and neck soft tissues was performed in the area of clinical concern. COMPARISON:  Ultrasound 11/28/2016, prior CT 11/27/2016 FINDINGS: Grayscale and color duplex ultrasound formed in the region  of clinical concern. Multiple enlarged lymph nodes again evident, as previously demonstrated on ultrasound and CT imaging. No focal fluid collection. Post biopsy changes of targeted lymph node are evident without fluid collection, gas, or significant edema. IMPRESSION: Ultrasound survey in the region of clinical concern demonstrates no evidence of focal fluid collection or gas. Expected changes of prior biopsy of the targeted enlarged cervical lymph nodes. Electronically Signed   By: Corrie Mckusick D.O.   On: 12/01/2016 12:16   ASSESSMENT: 66 y.o. 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  If he does well with this we will consider increasing this to 200 mg at the next visit which will be 12/25/2016.  He is still not back to baseline.  He is quite weak, doubtless because of his anemia.  We are expecting this to improve as we treat his mantle cell lymphoma.  There have been no bleeding episodes and he is not aware of any problems with palpitations.  At some point I would like to switch him to ibrutinib but again the concerns regarding bleeding and atrial fibrillation make me want to postpone that at least until his lymphoma is better  controlled  We will see him again in a week.  He knows to call for any problems that may develop before that visit.  Chauncey Cruel, MD   12/19/2016 2:00 PM Medical Oncology and Hematology [facility] [address] [place], [place] [address] Tel. [phone]    Fax. [phone]   ADDENDUM: We are proceeding with rituximab today. If the patient can tolerate it, given his response to steroids we may not need to do bendamustine and I am holding that for now.  I would like to treat him with imbruvica but given the concerns re bleeding and Afib we are hlding off on that agent.  He has a good understanding of the possible toxicities, side  effects and complications of rituximab and is agreeable to start.  I personally saw this patient and performed a substantive portion of this encounter with the listed APP documented above.   Chauncey Cruel, MD Medical Oncology and Hematology [facility] [address] [place], [place] [address] Tel. [phone]    Fax. [phone]

## 2016-12-23 NOTE — Progress Notes (Signed)
[name] [name]  Telephone:([phone]) [phone] Fax:(336) [phone]     ID: [name] [name] DOB: 1950-02-06  MR#: [id number]  UYQ#:[id number]  Patient Care Team: [name] Low, MD as PCP - General (Internal Medicine) Magrinat, [name], MD as Consulting Physician (Oncology) [name] [name] OTHER MD:  CHIEF COMPLAINT: mantle Cell lymphoma  CURRENT TREATMENT: Rituximab weekly   HISTORY OF CURRENT ILLNESS: From the original intake note:  [name] [name] presented to the hospital on 11/27/16 as he presented to IR as outpatient for elective biopsy of right neck mass, and was found to have Atrial fibrillation with RVR and was sent to ED.  His cardiac issues stabilized, and he underwent bone marrow biopsy for further work up of his mantle cell lymphoma.  He was started on Prednisone, and discharged in stable condition back to his nursing home with f/u here to start on Rituximab.    The patient's subsequent history is as detailed below.  INTERVAL HISTORY: [name] [name] returns today for follow-up and treatment of his mantle cell non-Hodgkin's B-cell lymphoma. He is accompanied by his transportation aid. He continues on rituximab, receiving his third dose today.  He had a significant reaction the first time, so last week he only received 100 mg.  He did very well with that.  We are increasing his dose to 300 mg today.    REVIEW OF SYSTEMS: [name] [name] reports that his legs are weaker, and they don't weigh as much as the rest of his body. He is having trouble walking due to this. He notes that he has trouble sitting up in a chair. He reports that the aids put a strap around his when he sits in a wheelchair, but he is worried about falling. He notes that he fell several times in the past year and has not been able to walk very much after that. He notes that he is receiving physical therapy, and he completes four different exercises to strengthen his legs. He notes that sometimes physical therapy makes him very  tired. He lives at the [place] home. He reports that he stays in a wheel chair most of the time. He also reports that the aids at his home do not understand that he is weak and cannot walk. He reports that if the aids at his home will help him more, he would probably be able to walk more. He notes that before falling 6 months ago, he used to walk around his building and stayed active. He notes that eat has been trying to eat more. He denies unusual headaches, visual changes, nausea, vomiting, or dizziness. There has been no unusual cough, phlegm production, or pleurisy. This been no change in bowel or bladder habits. He denies unexplained fatigue or unexplained weight loss, bleeding, rash, or fever. A detailed review of systems was otherwise stable.    PAST MEDICAL HISTORY: Past Medical History:  Diagnosis Date  . Bladder tumor   . Cataract   . CVA (cerebral infarction)    2008  . Hyperlipidemia   . Hypertension     PAST SURGICAL HISTORY: Past Surgical History:  Procedure Laterality Date  . CHOLECYSTECTOMY    . EYE SURGERY    . FLEXIBLE SIGMOIDOSCOPY N/A 11/30/2013   Procedure: FLEXIBLE SIGMOIDOSCOPY;  Surgeon: Inda Castle, MD;  Location: [facility];  Service: Endoscopy;  Laterality: N/A;  . HOT HEMOSTASIS N/A 11/30/2013   Procedure: HOT HEMOSTASIS (ARGON PLASMA COAGULATION/BICAP);  Surgeon: Inda Castle, MD;  Location: [facility];  Service: Endoscopy;  Laterality: N/A;  . MOUTH SURGERY      FAMILY HISTORY Family History  Problem Relation Age of Onset  . Colon cancer Father   . Colon cancer Sister 57  . Hypertension Sister   The patient's father died from rectal cancer at the age of 67.  The patient's mother died from some type of cancer, the patient does not know what type, at age 11.  The patient had 2 sisters, 1 of whom died at age 17 with cancer of the rectum.  The patient had 1 brother, who also died from.  Again he is not sure what type.  SOCIAL HISTORY:   Lives at [place].  He has lived there since 2008, following a left body stroke he is single, with no children.    ADVANCED DIRECTIVES: [name] [name] is not sure whether or not he has a healthcare power of attorney filed.  He does not recall who he may have named   HEALTH MAINTENANCE: Social History   Tobacco Use  . Smoking status: Former Smoker    Last attempt to quit: 09/15/1985    Years since quitting: 31.2  . Smokeless tobacco: Never Used  . Tobacco comment: Started at age 55, quit in the 1980's  Substance Use Topics  . Alcohol use: No  . Drug use: No     Colonoscopy:  PAP:  Bone density:   No Known Allergies  Current Outpatient Medications  Medication Sig Dispense Refill  . acetaminophen (TYLENOL) 650 MG CR tablet Take 650 mg every 8 (eight) hours as needed by mouth (for pain or fever ("for 48 hours")).    Marland Kitchen allopurinol (ZYLOPRIM) 300 MG tablet Take 1 tablet (300 mg total) by mouth daily.    Marland Kitchen amiodarone (PACERONE) 200 MG tablet Take 2 tablets (400 mg total) by mouth 2 (two) times daily for 4 days, THEN 1 tablet (200 mg total) daily. (Patient taking differently: Take 1 Tablet (200 mg) by mouth daily)    . aspirin 81 MG chewable tablet Chew 1 tablet (81 mg total) by mouth daily.    . Chlorhexidine Gluconate Cloth 2 % PADS Apply 6 each topically daily at 6 (six) AM. 6 each 0  . Cholecalciferol (VITAMIN D-3) 1000 units CAPS Take 1,000 Units by mouth daily.     Marland Kitchen diltiazem (CARDIZEM CD) 240 MG 24 hr capsule Take 1 capsule (240 mg total) by mouth daily.    Marland Kitchen docusate sodium (COLACE) 100 MG capsule Take 100 mg by mouth 2 (two) times daily. For constipation    . ferrous sulfate 325 (65 FE) MG tablet Take 325 mg by mouth daily with breakfast.     . folic acid (FOLVITE) 1 MG tablet Take 1 tablet (1 mg total) by mouth daily. 30 tablet 0  . ibuprofen (ADVIL,MOTRIN) 400 MG tablet Take 400 mg by mouth every 6 (six) hours as needed for moderate pain.     Marland Kitchen  lisinopril (ZESTRIL) 2.5 MG tablet Take 1 tablet (2.5 mg total) by mouth daily. 30 tablet 11  . loratadine (CLARITIN) 10 MG tablet Take 10 mg daily by mouth.    . metoprolol tartrate (LOPRESSOR) 50 MG tablet Take 1 tablet (50 mg total) by mouth 2 (two) times daily.    . mupirocin ointment (BACTROBAN) 2 % Place 1 application into the nose 2 (two) times daily. 22 g 0  . omeprazole (PRILOSEC) 20 MG capsule Take 1 capsule by mouth daily.    . simvastatin (ZOCOR)  40 MG tablet Take 40 mg by mouth at bedtime.     No current facility-administered medications for this visit.    Facility-Administered Medications Ordered in Other Visits  Medication Dose Route Frequency Provider Last Rate Last Dose  . famotidine (PEPCID) IVPB 20 mg premix  20 mg Intravenous Q12H Tanner, Lucianne Lei E., PA-C   20 mg at 12/11/16 1725    OBJECTIVE: Middle-aged white man examined in a wheelchair   Vitals:   12/25/16 1200  BP: (!) 129/117  Pulse: 81  Resp: 18  Temp: (!) 97.5 F (36.4 C)  SpO2: 96%     Body mass index is 29.06 kg/m.   Wt Readings from Last 3 Encounters:  12/13/16 214 lb 4.6 oz (97.2 kg)  12/11/16 190 lb 3.2 oz (86.3 kg)  12/06/16 196 lb 12.8 oz (89.3 kg)      ECOG FS:2 - Symptomatic, <50% confined to bed   Sclerae unicteric Large right supraclavicular adenopathy as previously noted Lungs no rales or rhonchi Heart regular rate and rhythm Abd soft, nontender, positive bowel sounds MSK kyphosis but no focal spinal tenderness, chronic right upper extremity lymphedema Neuro: nonfocal, well oriented, appropriate affect  LAB RESULTS:  CMP     Component Value Date/Time   NA 147 (H) 12/25/2016 1148   K 4.3 12/25/2016 1148   CL 109 12/14/2016 0515   CO2 21 (L) 12/25/2016 1148   GLUCOSE 87 12/25/2016 1148   BUN 20.6 12/25/2016 1148   CREATININE 0.7 12/25/2016 1148   CALCIUM 8.4 12/25/2016 1148   PROT 5.6 (L) 12/25/2016 1148   ALBUMIN 2.1 (L) 12/25/2016 1148   AST 35 (H) 12/25/2016 1148   ALT 10  12/25/2016 1148   ALKPHOS 111 12/25/2016 1148   BILITOT 2.00 (H) 12/25/2016 1148   GFRNONAA >60 12/14/2016 0515   GFRAA >60 12/14/2016 0515    No results found for: TOTALPROTELP, ALBUMINELP, A1GS, A2GS, BETS, BETA2SER, GAMS, MSPIKE, SPEI  No results found for: KPAFRELGTCHN, LAMBDASER, KAPLAMBRATIO  Lab Results  Component Value Date   WBC 7.3 12/25/2016   NEUTROABS 3.4 12/19/2016   HGB 7.8 (L) 12/25/2016   HCT 25.1 (L) 12/25/2016   MCV 92.0 12/25/2016   PLT 92 (L) 12/25/2016      Chemistry      Component Value Date/Time   NA 147 (H) 12/25/2016 1148   K 4.3 12/25/2016 1148   CL 109 12/14/2016 0515   CO2 21 (L) 12/25/2016 1148   BUN 20.6 12/25/2016 1148   CREATININE 0.7 12/25/2016 1148      Component Value Date/Time   CALCIUM 8.4 12/25/2016 1148   ALKPHOS 111 12/25/2016 1148   AST 35 (H) 12/25/2016 1148   ALT 10 12/25/2016 1148   BILITOT 2.00 (H) 12/25/2016 1148       No results found for: LABCA2  No components found for: [id number]  No results for input(s): INR in the last 168 hours.  No results found for: LABCA2  No results found for: NWG956  No results found for: OZH086  No results found for: VHQ469  No results found for: CA2729  No components found for: HGQUANT  No results found for: CEA1 / No results found for: CEA1   No results found for: AFPTUMOR  No results found for: CHROMOGRNA  No results found for: PSA1  Appointment on 12/25/2016  Component Date Value Ref Range Status  . WBC 12/25/2016 7.3  4.0 - 10.3 10e3/uL Final  . HGB 12/25/2016 7.8* 13.0 - 17.1 g/dL Final  .  HCT 12/25/2016 25.1* 38.4 - 49.9 % Final  . Platelets 12/25/2016 92* 140 - 400 10e3/uL Final  . MCV 12/25/2016 92.0  79.3 - 98.0 fL Final  . MCH 12/25/2016 28.8  27.2 - 33.4 pg Final  . MCHC 12/25/2016 31.3* 32.0 - 36.0 g/dL Final  . RBC 12/25/2016 2.72* 4.20 - 5.82 10e6/uL Final  . RDW 12/25/2016 20.7* 11.0 - 14.6 % Final  . Sodium 12/25/2016 147* 136 - 145 mEq/L Final  .  Potassium 12/25/2016 4.3  3.5 - 5.1 mEq/L Final  . Chloride 12/25/2016 115* 98 - 109 mEq/L Final  . CO2 12/25/2016 21* 22 - 29 mEq/L Final  . Glucose 12/25/2016 87  70 - 140 mg/dl Final   Glucose reference range is for nonfasting patients. Fasting glucose reference range is 70- 100.  Marland Kitchen BUN 12/25/2016 20.6  7.0 - 26.0 mg/dL Final  . Creatinine 12/25/2016 0.7  0.7 - 1.3 mg/dL Final  . Total Bilirubin 12/25/2016 2.00* 0.20 - 1.20 mg/dL Final  . Alkaline Phosphatase 12/25/2016 111  40 - 150 U/L Final  . AST 12/25/2016 35* 5 - 34 U/L Final  . ALT 12/25/2016 10  0 - 55 U/L Final  . Total Protein 12/25/2016 5.6* 6.4 - 8.3 g/dL Final  . Albumin 12/25/2016 2.1* 3.5 - 5.0 g/dL Final  . Calcium 12/25/2016 8.4  8.4 - 10.4 mg/dL Final  . Anion Gap 12/25/2016 11  3 - 11 mEq/L Final  . EGFR 12/25/2016 >60  >60 ml/min/1.73 m2 Final   eGFR is calculated using the CKD-EPI Creatinine Equation (2009)  . ANC ([facility] manual diff) 12/25/2016 3.7  1.5 - 6.5 10e3/uL Final  . ALC 12/25/2016 2.6  0.9 - 3.3 10e3/uL Final  . SEG 12/25/2016 44  38 - 77 % Final  . Band Neutrophils 12/25/2016 4  0 - 10 % Final  . LYMPH 12/25/2016 36  14 - 49 % Final  . MONO 12/25/2016 7  0 - 14 % Final  . Metamyelocytes 12/25/2016 2* 0 - 0 % Final  . Myelocytes 12/25/2016 1* 0 - 0 % Final  . Other Cell 12/25/2016 6 Large mononuclear cells with scant cytoplasm. * 0 - 0 % Final  . nRBC 12/25/2016 2* 0 - 0 % Final  . Polychromasia 12/25/2016 Slight  Slight Final  . Tear Drop Cells 12/25/2016 Few  Negative Final  . PLT EST 12/25/2016 Decreased  Adequate Final    (this displays the last labs from the last 3 days)  No results found for: TOTALPROTELP, ALBUMINELP, A1GS, A2GS, BETS, BETA2SER, GAMS, MSPIKE, SPEI (this displays SPEP labs)  No results found for: KPAFRELGTCHN, LAMBDASER, KAPLAMBRATIO (kappa/lambda light chains)  No results found for: HGBA, HGBA2QUANT, HGBFQUANT, HGBSQUAN (Hemoglobinopathy evaluation)   Lab Results    Component Value Date   LDH 226 (H) 12/13/2016    Lab Results  Component Value Date   IRON 15 (L) 11/28/2016   TIBC 190 (L) 11/28/2016   IRONPCTSAT 8 (L) 11/28/2016   (Iron and TIBC)  Lab Results  Component Value Date   FERRITIN 392 (H) 11/28/2016    Urinalysis    Component Value Date/Time   COLORURINE AMBER (A) 12/11/2016 2051   APPEARANCEUR HAZY (A) 12/11/2016 2051   LABSPEC 1.023 12/11/2016 2051   PHURINE 6.0 12/11/2016 2051   [place] 12/11/2016 2051   HGBUR NEGATIVE 12/11/2016 2051   BILIRUBINUR SMALL (A) 12/11/2016 2051   KETONESUR 5 (A) 12/11/2016 2051   PROTEINUR 100 (A) 12/11/2016 2051  NITRITE NEGATIVE 12/11/2016 2051   LEUKOCYTESUR NEGATIVE 12/11/2016 2051     STUDIES: Dg Chest 2 View  Result Date: 11/30/2016 CLINICAL DATA:  Fever today. EXAM: CHEST  2 VIEW COMPARISON:  CT chest 11/27/2016.  Chest radiograph 11/27/2016. FINDINGS: Mild cardiac enlargement. No vascular congestion. No airspace disease or consolidation in the lungs. No blunting of costophrenic angles. No pneumothorax. Calcification of the aorta. Prominent lymphadenopathy demonstrated at CT is not well visualized radiographically. IMPRESSION: Cardiac enlargement.  No evidence of active pulmonary disease. Electronically Signed   By: Lucienne Capers M.D.   On: 11/30/2016 23:36   Ct Angio Chest Pe W And/or Wo Contrast  Result Date: 11/27/2016 CLINICAL DATA:  Tachycardia. Suspect pulmonary embolism. History of hypertension, ex smoker, bladder tumor. Scheduled for neck biopsy today. EXAM: CT ANGIOGRAPHY CHEST WITH CONTRAST TECHNIQUE: Multidetector CT imaging of the chest was performed using the standard protocol during bolus administration of intravenous contrast. Multiplanar CT image reconstructions and MIPs were obtained to evaluate the vascular anatomy. CONTRAST:  167m ISOVUE-370 IOPAMIDOL (ISOVUE-370) INJECTION 76% COMPARISON:  CT neck November 21, 2016 and chest radiograph November 27, 2016 and CT abdomen and pelvis June 22, 2012 FINDINGS: CARDIOVASCULAR: Adequate contrast opacification of the pulmonary artery's. Main pulmonary artery is not enlarged. No pulmonary arterial filling defects to the level of the segmental branches, distal branches obscured by respiratory motion. Heart is upper limits of normal in size. Mild coronary artery calcifications. Small pericardial effusion. Thoracic aorta is normal course and caliber, unremarkable. MEDIASTINUM/NODES: Greater than expected number of mildly enlarged mediastinal and hilar lymph nodes. Anterior mediastinal fat stranding. Bulky RIGHT greater than LEFT axillary lymphadenopathy, 12.9 x 6.7 cm RIGHT axillary nodal conglomeration. Bulky RIGHT grand LEFT supraclavicular lymphadenopathy. LUNGS/PLEURA: Tracheobronchial tree is patent, no pneumothorax. No pleural effusions, focal consolidations, pulmonary nodules or masses. UPPER ABDOMEN: New splenomegaly. 6.7 cm cyst (5 Hounsfield units) LEFT renal fossa, larger than prior CT. Stable 3 cm cyst (3 Hounsfield units) RIGHT lobe of the liver. Partially imaged probable portal caval lymphadenopathy. MUSCULOSKELETAL: Nonacute. RIGHT biceps intramuscular lipoma versus atrophy. Multilevel scattered Schmorl's nodes. Review of the MIP images confirms the above findings. IMPRESSION: 1. No acute pulmonary embolism. 2. Bulky supraclavicular, axillary and possibly portal caval lymphadenopathy. 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 3. Borderline cardiomegaly.  No acute pulmonary process. Aortic Atherosclerosis (ICD10-I70.0). Electronically Signed   By: CElon AlasM.D.   On: 11/27/2016 17:13   Ct Humerus Right Wo Contrast  Result Date: 12/13/2016 CLINICAL DATA:  Right upper arm mass.  History mantle cell lymphoma. EXAM: CT OF THE RIGHT HUMERUS WITHOUT CONTRAST TECHNIQUE: Multidetector CT imaging was performed according to the standard protocol. Multiplanar CT image  reconstructions were also generated. COMPARISON:  CT chest dated November 27, 2016. FINDINGS: Bones/Joint/Cartilage No acute fracture or malalignment. Mild degenerative changes of the acromioclavicular and glenohumeral joints. Osteopenia. Ligaments Suboptimally assessed by CT. Muscles and Tendons Within the right biceps muscle, there is a large predominantly fat density mass measuring 10.2 x 11.6 x 16.5 cm. There is minimal internal stranding without focal nodularity. The rotator cuff is grossly intact. Soft tissues Markedly enlarged right axillary and lower right cervical lymph nodes. Unchanged simple cysts in the liver. IMPRESSION: 1. Large, 16.5 cm predominantly fat density mass within the right biceps muscle, most consistent with a lipoma. Minimal internal stranding without focal nodularity makes liposarcoma unlikely. 2. Markedly enlarged right axillary and lower right cervical lymph nodes, consistent with history of mantle cell lymphoma. Electronically  Signed   By: Titus Dubin M.D.   On: 12/13/2016 14:15   US Scrotum  Result Date: 12/13/2016 CLINICAL DATA:  Chronic scrotal edema EXAM: ULTRASOUND OF SCROTUM TECHNIQUE: Complete ultrasound examination of the testicles, epididymis, and other scrotal structures was performed. COMPARISON:  None. FINDINGS: Right testicle Measurements: 4.0 x 1.9 x 2.5 cm. No mass or microlithiasis visualized. Left testicle Measurements: 3.9 x 2.4 x 2.6 cm. No mass or microlithiasis visualized. Right epididymis:  4 mm cyst in the epididymal head. Left epididymis:  Normal in size and appearance. Hydrocele:  Bilateral hydroceles Varicocele:  None visualized. There is a complex fluid collection/cyst in the left scrotum measuring 8.2 x 6.4 x 6.2 cm. Layering internal debris. This is lateral to the left testicle. Complex cystic area noted in the right inguinal canal with internal septations. IMPRESSION: No testicular abnormality. Complex cystic area within the right inguinal canal  containing internal septations of unknown etiology. Large complex cystic area in the left lateral scrotum measuring up to 8.2 cm. This could reflect complex hydrocele or spermatocele. Electronically Signed   By: Rolm Baptise M.D.   On: 12/13/2016 13:27   Dg Chest Portable 1 View  Result Date: 12/11/2016 CLINICAL DATA:  Shortness of breath EXAM: PORTABLE CHEST 1 VIEW COMPARISON:  11/30/2016 FINDINGS: 1756 hours. The cardio pericardial silhouette is enlarged. The lungs are clear without focal pneumonia, edema, pneumothorax or pleural effusion. The visualized bony structures of the thorax are intact. Telemetry leads overlie the chest. IMPRESSION: Stable.  No acute cardiopulmonary findings. Electronically Signed   By: Misty Stanley M.D.   On: 12/11/2016 18:38   Dg Chest Port 1 View  Result Date: 11/27/2016 CLINICAL DATA:  Atrial fibrillation EXAM: PORTABLE CHEST 1 VIEW COMPARISON:  None. FINDINGS: Cardiac silhouette is upper limits of normal. Mild aortic atherosclerotic calcification. No focal airspace consolidation or pulmonary edema. No pneumothorax or sizable pleural effusion. IMPRESSION: No active disease. Aortic Atherosclerosis (ICD10-I70.0). Electronically Signed   By: Ulyses Jarred M.D.   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 EXAM: CT GUIDED BONE MARROW ASPIRATES AND BIOPSY Physician: Stephan Minister. Anselm Pancoast, MD MEDICATIONS: None. ANESTHESIA/SEDATION: Fentanyl 50 mcg IV; Versed 1.0 mg IV Moderate Sedation Time:  12 minutes The patient was continuously monitored during the procedure by the interventional radiology nurse under my direct supervision. COMPLICATIONS: None immediate. PROCEDURE: The procedure was explained to the patient. The risks and benefits of the procedure were discussed and the patient's questions were addressed. Informed consent was obtained from the patient. The patient was placed prone on CT scan. Images of  the pelvis were obtained. The right side of back was prepped and draped in sterile fashion. The skin and right posterior iliac bone were anesthetized with 1% lidocaine. 11 gauge bone needle was directed into the right iliac bone with CT guidance. Two aspirates and one core biopsy obtained. Bandage placed over the puncture site. FINDINGS: Bone needle directed in the right ilium. Extensive lymphadenopathy throughout the pelvis and inguinal regions. IMPRESSION: CT guided bone marrow aspirates and core biopsy. Electronically Signed   By: Markus Daft M.D.   On: 12/05/2016 10:19   [place] Core Biopsy (lymph Nodes)  Result Date: 11/28/2016 INDICATION: Bulky cervical axillary lymphadenopathy. Please perform ultrasound-guided biopsy for tissue diagnostic purposes. EXAM: ULTRASOUND-GUIDED RIGHT CERVICAL LYMPH NODE BIOPSY COMPARISON:  Neck CT - 11/21/2016; chest CT - 11/17/2016 MEDICATIONS: None ANESTHESIA/SEDATION: Moderate (conscious) sedation was employed during this procedure.  A total of Versed 2 mg and Fentanyl 75 mcg was administered intravenously. Moderate Sedation Time: 10 minutes. The patient's level of consciousness and vital signs were monitored continuously by radiology nursing throughout the procedure under my direct supervision. COMPLICATIONS: None immediate. TECHNIQUE: Informed written consent was obtained from the patient after a discussion of the risks, benefits and alternatives to treatment. Questions regarding the procedure were encouraged and answered. Initial ultrasound scanning demonstrated multiple enlarged cervical lymph nodes. A dominant approximately 3.2 x 1.6 cm lymph node within the superficial aspect of the mid right lateral neck was targeted for biopsy given lymph node location and sonographic window. An ultrasound image was saved for documentation purposes. The procedure was planned. A timeout was performed prior to the initiation of the procedure. The operative was prepped and draped in the usual  sterile fashion, and a sterile drape was applied covering the operative field. A timeout was performed prior to the initiation of the procedure. Local anesthesia was provided with 1% lidocaine with epinephrine. Under direct ultrasound guidance, an 18 gauge core needle device was utilized to obtain to obtain 6 core needle biopsies of the dominant right cervical lymph node. The samples were placed in saline and submitted to pathology. The needle was removed and hemostasis was achieved with manual compression. Post procedure scan was negative for significant hematoma. A dressing was placed. The patient tolerated the procedure well without immediate postprocedural complication. IMPRESSION: Technically successful ultrasound guided biopsy of dominant right cervical lymph node. Electronically Signed   By: Sandi Mariscal M.D.   On: 11/28/2016 13:48   US Soft Tissue Neck  Result Date: 12/01/2016 CLINICAL DATA:  66 year old male with a history of neck mass, prior biopsy, possible infection EXAM: ULTRASOUND OF HEAD/NECK SOFT TISSUES TECHNIQUE: Ultrasound examination of the head and neck soft tissues was performed in the area of clinical concern. COMPARISON:  Ultrasound 11/28/2016, prior CT 11/27/2016 FINDINGS: Grayscale and color duplex ultrasound formed in the region of clinical concern. Multiple enlarged lymph nodes again evident, as previously demonstrated on ultrasound and CT imaging. No focal fluid collection. Post biopsy changes of targeted lymph node are evident without fluid collection, gas, or significant edema. IMPRESSION: Ultrasound survey in the region of clinical concern demonstrates no evidence of focal fluid collection or gas. Expected changes of prior biopsy of the targeted enlarged cervical lymph nodes. Electronically Signed   By: Corrie Mckusick D.O.   On: 12/01/2016 12:16   ASSESSMENT: 66 y.o. 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  I am increasing the dose to 300 mg.  If he tolerates this well we will go to full dose, 800 mg next week.  He is rightly concerned about fall risk.  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  I have written a letter alerting them to the fact that he is at high risk of falling and that if he is going to be walking he needs to be assisted.  The plan is to continue the rituximab at least 3 full cycles at which point we will assess whether we should switch to ibrutinib or not.  He knows to call for any other issues that may develop before the next visit.    Magrinat, [name], MD  12/25/16 12:33 PM Medical Oncology and Hematology [facility] [address] [place], [place] [address] Tel. [phone]    Fax. [phone]  This document serves as a record of services personally performed by Lurline Del, MD. It was created on his behalf by Sheron Nightingale, a trained medical scribe. The creation of this record is based on the scribe's personal observations and the provider's statements to them.   I have reviewed the above documentation for accuracy and completeness, and I agree with the above.

## 2016-12-25 ENCOUNTER — Ambulatory Visit (HOSPITAL_BASED_OUTPATIENT_CLINIC_OR_DEPARTMENT_OTHER): Payer: Medicare Other

## 2016-12-25 ENCOUNTER — Encounter: Payer: Self-pay | Admitting: Oncology

## 2016-12-25 ENCOUNTER — Other Ambulatory Visit (HOSPITAL_BASED_OUTPATIENT_CLINIC_OR_DEPARTMENT_OTHER): Payer: Medicare Other

## 2016-12-25 ENCOUNTER — Ambulatory Visit (HOSPITAL_BASED_OUTPATIENT_CLINIC_OR_DEPARTMENT_OTHER): Payer: Medicare Other | Admitting: Oncology

## 2016-12-25 ENCOUNTER — Telehealth: Payer: Self-pay | Admitting: Oncology

## 2016-12-25 VITALS — BP 129/117 | HR 81 | Temp 97.5°F | Resp 18 | Ht 72.0 in

## 2016-12-25 VITALS — BP 115/67 | HR 90 | Temp 97.9°F | Resp 17

## 2016-12-25 DIAGNOSIS — C8310 Mantle cell lymphoma, unspecified site: Secondary | ICD-10-CM

## 2016-12-25 DIAGNOSIS — C8311 Mantle cell lymphoma, lymph nodes of head, face, and neck: Secondary | ICD-10-CM

## 2016-12-25 DIAGNOSIS — M6281 Muscle weakness (generalized): Secondary | ICD-10-CM

## 2016-12-25 DIAGNOSIS — Z9181 History of falling: Secondary | ICD-10-CM | POA: Diagnosis not present

## 2016-12-25 DIAGNOSIS — Z5112 Encounter for antineoplastic immunotherapy: Secondary | ICD-10-CM

## 2016-12-25 DIAGNOSIS — D649 Anemia, unspecified: Secondary | ICD-10-CM | POA: Diagnosis not present

## 2016-12-25 LAB — CBC WITH DIFFERENTIAL/PLATELET
HCT: 25.1 % — ABNORMAL LOW (ref 38.4–49.9)
HGB: 7.8 g/dL — ABNORMAL LOW (ref 13.0–17.1)
MCH: 28.8 pg (ref 27.2–33.4)
MCHC: 31.3 g/dL — ABNORMAL LOW (ref 32.0–36.0)
MCV: 92 fL (ref 79.3–98.0)
PLATELETS: 92 10*3/uL — AB (ref 140–400)
RBC: 2.72 10*6/uL — AB (ref 4.20–5.82)
RDW: 20.7 % — ABNORMAL HIGH (ref 11.0–14.6)
WBC: 7.3 10*3/uL (ref 4.0–10.3)

## 2016-12-25 LAB — COMPREHENSIVE METABOLIC PANEL
ALK PHOS: 111 U/L (ref 40–150)
ALT: 10 U/L (ref 0–55)
ANION GAP: 11 meq/L (ref 3–11)
AST: 35 U/L — ABNORMAL HIGH (ref 5–34)
Albumin: 2.1 g/dL — ABNORMAL LOW (ref 3.5–5.0)
BILIRUBIN TOTAL: 2 mg/dL — AB (ref 0.20–1.20)
BUN: 20.6 mg/dL (ref 7.0–26.0)
CALCIUM: 8.4 mg/dL (ref 8.4–10.4)
CHLORIDE: 115 meq/L — AB (ref 98–109)
CO2: 21 mEq/L — ABNORMAL LOW (ref 22–29)
CREATININE: 0.7 mg/dL (ref 0.7–1.3)
EGFR: >60 mL/min/{1.73_m2} (ref >60)
Glucose: 87 mg/dl (ref 70–140)
Potassium: 4.3 mEq/L (ref 3.5–5.1)
Sodium: 147 mEq/L — ABNORMAL HIGH (ref 136–145)
TOTAL PROTEIN: 5.6 g/dL — AB (ref 6.4–8.3)

## 2016-12-25 LAB — MANUAL DIFFERENTIAL
ALC: 2.6 10*3/uL (ref 0.9–3.3)
ANC (CHCC MAN DIFF): 3.7 10*3/uL (ref 1.5–6.5)
Band Neutrophils: 4 % (ref 0–10)
LYMPH: 36 % (ref 14–49)
MONO: 7 % (ref 0–14)
MYELOCYTES: 1 % — AB (ref 0–0)
Metamyelocytes: 2 % — ABNORMAL HIGH (ref 0–0)
NRBC: 2 % — AB (ref 0–0)
PLT EST: DECREASED
SEG: 44 % (ref 38–77)

## 2016-12-25 MED ORDER — DEXAMETHASONE 4 MG PO TABS
ORAL_TABLET | ORAL | Status: AC
  Filled 2016-12-25: qty 5

## 2016-12-25 MED ORDER — FAMOTIDINE IN NACL 20-0.9 MG/50ML-% IV SOLN
20.0000 mg | Freq: Once | INTRAVENOUS | Status: AC | PRN
  Administered 2016-12-25: 20 mg via INTRAVENOUS

## 2016-12-25 MED ORDER — DIPHENHYDRAMINE HCL 25 MG PO CAPS
25.0000 mg | ORAL_CAPSULE | Freq: Once | ORAL | Status: AC
  Administered 2016-12-25: 25 mg via ORAL

## 2016-12-25 MED ORDER — SODIUM CHLORIDE 0.9 % IV SOLN
Freq: Once | INTRAVENOUS | Status: AC
  Administered 2016-12-25: 16:00:00 via INTRAVENOUS

## 2016-12-25 MED ORDER — SODIUM CHLORIDE 0.9 % IV SOLN
Freq: Once | INTRAVENOUS | Status: AC
  Administered 2016-12-25: 14:00:00 via INTRAVENOUS

## 2016-12-25 MED ORDER — ACETAMINOPHEN 325 MG PO TABS
650.0000 mg | ORAL_TABLET | Freq: Once | ORAL | Status: AC
  Administered 2016-12-25: 650 mg via ORAL

## 2016-12-25 MED ORDER — LORAZEPAM 1 MG PO TABS
ORAL_TABLET | ORAL | Status: AC
  Filled 2016-12-25: qty 1

## 2016-12-25 MED ORDER — DEXAMETHASONE 4 MG PO TABS
20.0000 mg | ORAL_TABLET | Freq: Once | ORAL | Status: AC
  Administered 2016-12-25: 20 mg via ORAL

## 2016-12-25 MED ORDER — METHYLPREDNISOLONE SODIUM SUCC 125 MG IJ SOLR
125.0000 mg | Freq: Once | INTRAMUSCULAR | Status: AC | PRN
  Administered 2016-12-25: 125 mg via INTRAVENOUS

## 2016-12-25 MED ORDER — MEPERIDINE HCL 25 MG/ML IJ SOLN: 12.5000 mg | Freq: Once | INTRAMUSCULAR | Status: DC

## 2016-12-25 MED ORDER — RITUXIMAB CHEMO INJECTION 500 MG/50ML
300.0000 mg | Freq: Once | INTRAVENOUS | Status: AC
  Administered 2016-12-25: 300 mg via INTRAVENOUS
  Filled 2016-12-25: qty 30

## 2016-12-25 MED ORDER — ACETAMINOPHEN 325 MG PO TABS
ORAL_TABLET | ORAL | Status: AC
  Filled 2016-12-25: qty 2

## 2016-12-25 MED ORDER — DIPHENHYDRAMINE HCL 50 MG/ML IJ SOLN
25.0000 mg | Freq: Once | INTRAMUSCULAR | Status: AC | PRN
  Administered 2016-12-25: 25 mg via INTRAVENOUS

## 2016-12-25 MED ORDER — MEPERIDINE HCL 25 MG/ML IJ SOLN
25.0000 mg | Freq: Once | INTRAMUSCULAR | Status: AC
  Administered 2016-12-25: 25 mg via INTRAVENOUS

## 2016-12-25 MED ORDER — DIPHENHYDRAMINE HCL 25 MG PO CAPS
ORAL_CAPSULE | ORAL | Status: AC
  Filled 2016-12-25: qty 1

## 2016-12-25 MED ORDER — LORAZEPAM 1 MG PO TABS
0.5000 mg | ORAL_TABLET | Freq: Once | ORAL | Status: AC
  Administered 2016-12-25: 0.5 mg via ORAL

## 2016-12-25 MED ORDER — MEPERIDINE HCL 25 MG/ML IJ SOLN
INTRAMUSCULAR | Status: AC
  Filled 2016-12-25: qty 1

## 2016-12-25 NOTE — Telephone Encounter (Signed)
No 12/13 los at check out. Gave patient avs and calendar.

## 2016-12-25 NOTE — Patient Instructions (Signed)
[facility] Discharge Instructions for Patients Receiving Chemotherapy  Today you received the following chemotherapy agents:  Rituxan.  To help prevent nausea and vomiting after your treatment, we encourage you to take your nausea medication as directed.   If you develop nausea and vomiting that is not controlled by your nausea medication, call the clinic.   BELOW ARE SYMPTOMS THAT SHOULD BE REPORTED IMMEDIATELY:  *FEVER GREATER THAN 100.5 F  *CHILLS WITH OR WITHOUT FEVER  NAUSEA AND VOMITING THAT IS NOT CONTROLLED WITH YOUR NAUSEA MEDICATION  *UNUSUAL SHORTNESS OF BREATH  *UNUSUAL BRUISING OR BLEEDING  TENDERNESS IN MOUTH AND THROAT WITH OR WITHOUT PRESENCE OF ULCERS  *URINARY PROBLEMS  *BOWEL PROBLEMS  UNUSUAL RASH Items with * indicate a potential emergency and should be followed up as soon as possible.  Feel free to call the clinic should you have any questions or concerns. The clinic phone number is [phone].  Please show the CHEMO ALERT CARD at check-in to the Emergency Department and triage nurse.   

## 2016-12-25 NOTE — Progress Notes (Signed)
Per Dr Jana Hakim, [place] for Rituxan today with HGB 7.8 and PLT 92. Per Dr Jana Hakim, [place] for Rituxan today with tot bili 2.0

## 2016-12-25 NOTE — Progress Notes (Signed)
1705: Pt noted to be shaking all over. Pt states "I can't stop shaking and my back hurts" Rituxan infusion paused at this time and IVF running without difficulty to patent PIV.   1706: Dr. Burr Medico at bedside to evaluate pt. Orders received and pt medicated per orders; refer to [facility]. Vitals as charted.   1715: Dr. Jana Hakim at bedside to evaluate at this time. Pt medicated with Demerol per orders; refer to [facility]. Pt awake and alert.   1720: Pt started to dry heave and c/o nausea. Pt denies any other complaints.   1729: Pt resting with eyes closed; vitals as charted. Pt responds to verbal stimuli without difficulty. Respirations equal and unlabored. Per Dr. Jana Hakim; rituxan to be restarted at 100 mg/hr and not to increase rate. Rituxan started per orders; refer to [facility]. Vitals as charted.

## 2016-12-31 ENCOUNTER — Other Ambulatory Visit: Payer: Self-pay | Admitting: *Deleted

## 2016-12-31 DIAGNOSIS — C8310 Mantle cell lymphoma, unspecified site: Secondary | ICD-10-CM

## 2016-12-31 NOTE — Progress Notes (Signed)
[place]  [phone] [phone]     ID: [name] [name] DOB: 1950-08-13  MR#: [id number]  SVX#:[id number]  Patient Care Team: Wenda Low, MD as PCP - General (Internal Medicine) Lashe Oliveira, [name], MD as Consulting Physician (Oncology) OTHER MD:  CHIEF COMPLAINT: mantle Cell lymphoma  CURRENT TREATMENT: Rituximab weekly   HISTORY OF CURRENT ILLNESS: From the original intake note:  [name] [name] presented to the hospital on 11/27/16 as he presented to IR as outpatient for elective biopsy of right neck mass, and was found to have Atrial fibrillation with RVR and was sent to ED.  His cardiac issues stabilized, and he underwent bone marrow biopsy for further work up of his mantle cell lymphoma.  He was started on Prednisone, and discharged in stable condition back to his nursing home with f/u here to start on Rituximab.    The patient's subsequent history is as detailed below.  INTERVAL HISTORY: [name] [name] returns today for follow-up and treatment of his mantle cell non-Hodgkin's B-cell lymphoma.  We have tried to make his appointments early in the day so that he can have a longer infusion which means a lower chance of having a reaction.  Transportation has been a problem.  Also I am looking at the list of medications that he brought it, which does not match at all the list of medications he is supposed to be on at this point.  REVIEW OF SYSTEMS: [name] [name] reports that he fell yesterday while in therapy and that during his fall, he attempted to set his water down and struck his head and rolled to the ground. He denies unusual headaches, visual changes, nausea, vomiting, or dizziness. There has been no unusual cough, phlegm production, or pleurisy. This been no change in bowel or bladder habits. He denies unexplained fatigue or unexplained weight loss, bleeding, rash, or fever. A detailed review of systems was otherwise stable.      PAST MEDICAL HISTORY: Past  Medical History:  Diagnosis Date  . Bladder tumor   . Cataract   . CVA (cerebral infarction)    2008  . Hyperlipidemia   . Hypertension     PAST SURGICAL HISTORY: Past Surgical History:  Procedure Laterality Date  . CHOLECYSTECTOMY    . EYE SURGERY    . FLEXIBLE SIGMOIDOSCOPY N/A 11/30/2013   Procedure: FLEXIBLE SIGMOIDOSCOPY;  Surgeon: Inda Castle, MD;  Location: [facility] ENDOSCOPY;  Service: Endoscopy;  Laterality: N/A;  . HOT HEMOSTASIS N/A 11/30/2013   Procedure: HOT HEMOSTASIS (ARGON PLASMA COAGULATION/BICAP);  Surgeon: Inda Castle, MD;  Location: [facility];  Service: Endoscopy;  Laterality: N/A;  . MOUTH SURGERY      FAMILY HISTORY Family History  Problem Relation Age of Onset  . Colon cancer Father   . Colon cancer Sister 16  . Hypertension Sister   The patient's father died from rectal cancer at the age of 91.  The patient's mother died from some type of cancer, the patient does not know what type, at age 40.  The patient had 2 sisters, 1 of whom died at age 57 with cancer of the rectum.  The patient had 1 brother, who also died from.  Again he is not sure what type.  SOCIAL HISTORY:  Lives at [place].  He has lived there since 2008, following a left body stroke he is single, with no children.    ADVANCED DIRECTIVES: [name] [name] is not sure whether or not he has a healthcare  power of attorney filed.  He does not recall who he may have named   HEALTH MAINTENANCE: Social History   Tobacco Use  . Smoking status: Former Smoker    Last attempt to quit: 09/15/1985    Years since quitting: 31.3  . Smokeless tobacco: Never Used  . Tobacco comment: Started at age 48, quit in the 1980's  Substance Use Topics  . Alcohol use: No  . Drug use: No     Colonoscopy:  PAP:  Bone density:   No Known Allergies  Current Outpatient Medications  Medication Sig Dispense Refill  . acetaminophen (TYLENOL) 650 MG CR tablet Take 650 mg every 8  (eight) hours as needed by mouth (for pain or fever ("for 48 hours")).    Marland Kitchen allopurinol (ZYLOPRIM) 300 MG tablet Take 1 tablet (300 mg total) by mouth daily.    Marland Kitchen amiodarone (PACERONE) 200 MG tablet Take 2 tablets (400 mg total) by mouth 2 (two) times daily for 4 days, THEN 1 tablet (200 mg total) daily. (Patient taking differently: Take 1 Tablet (200 mg) by mouth daily)    . aspirin 81 MG chewable tablet Chew 1 tablet (81 mg total) by mouth daily.    . Chlorhexidine Gluconate Cloth 2 % PADS Apply 6 each topically daily at 6 (six) AM. 6 each 0  . Cholecalciferol (VITAMIN D-3) 1000 units CAPS Take 1,000 Units by mouth daily.     Marland Kitchen diltiazem (CARDIZEM CD) 240 MG 24 hr capsule Take 1 capsule (240 mg total) by mouth daily.    Marland Kitchen docusate sodium (COLACE) 100 MG capsule Take 100 mg by mouth 2 (two) times daily. For constipation    . ferrous sulfate 325 (65 FE) MG tablet Take 325 mg by mouth daily with breakfast.     . folic acid (FOLVITE) 1 MG tablet Take 1 tablet (1 mg total) by mouth daily. 30 tablet 0  . ibuprofen (ADVIL,MOTRIN) 400 MG tablet Take 400 mg by mouth every 6 (six) hours as needed for moderate pain.     Marland Kitchen lisinopril (ZESTRIL) 2.5 MG tablet Take 1 tablet (2.5 mg total) by mouth daily. 30 tablet 11  . loratadine (CLARITIN) 10 MG tablet Take 10 mg daily by mouth.    . metoprolol tartrate (LOPRESSOR) 50 MG tablet Take 1 tablet (50 mg total) by mouth 2 (two) times daily.    . mupirocin ointment (BACTROBAN) 2 % Place 1 application into the nose 2 (two) times daily. 22 g 0  . omeprazole (PRILOSEC) 20 MG capsule Take 1 capsule by mouth daily.    . simvastatin (ZOCOR) 40 MG tablet Take 40 mg by mouth at bedtime.     No current facility-administered medications for this visit.    Facility-Administered Medications Ordered in Other Visits  Medication Dose Route Frequency Provider Last Rate Last Dose  . famotidine (PEPCID) IVPB 20 mg premix  20 mg Intravenous Q12H Tanner, Lucianne Lei E., PA-C   20 mg at  12/11/16 1725    OBJECTIVE: Middle-aged white man examined in a wheelchair  Vitals:   12/18/2016 1241  BP: (!) 83/68  Pulse: (!) 108  Resp: 17  Temp: 97.6 F (36.4 C)  SpO2: 100%     Body mass index is 29.06 kg/m.   Wt Readings from Last 3 Encounters:  12/13/16 214 lb 4.6 oz (97.2 kg)  12/11/16 190 lb 3.2 oz (86.3 kg)  12/06/16 196 lb 12.8 oz (89.3 kg)      ECOG FS:2 - Symptomatic, <50%  confined to bed   Sclerae unicteric, slight anisometropia as previously noted Lungs no rales or rhonchi Heart regular rate and rhythm Abd soft, nontender, positive bowel sounds MSK no focal spinal tenderness, neck right upper extremity lymphedema Neuro: nonfocal, well oriented, she is affect  LAB RESULTS:  CMP     Component Value Date/Time   NA 147 (H) 12/25/2016 1148   K 4.3 12/25/2016 1148   CL 109 12/14/2016 0515   CO2 21 (L) 12/25/2016 1148   GLUCOSE 87 12/25/2016 1148   BUN 20.6 12/25/2016 1148   CREATININE 0.7 12/25/2016 1148   CALCIUM 8.4 12/25/2016 1148   PROT 5.6 (L) 12/25/2016 1148   ALBUMIN 2.1 (L) 12/25/2016 1148   AST 35 (H) 12/25/2016 1148   ALT 10 12/25/2016 1148   ALKPHOS 111 12/25/2016 1148   BILITOT 2.00 (H) 12/25/2016 1148   GFRNONAA >60 12/14/2016 0515   GFRAA >60 12/14/2016 0515    No results found for: TOTALPROTELP, ALBUMINELP, A1GS, A2GS, BETS, BETA2SER, GAMS, MSPIKE, SPEI  No results found for: KPAFRELGTCHN, LAMBDASER, KAPLAMBRATIO  Lab Results  Component Value Date   WBC 7.3 12/25/2016   NEUTROABS 3.4 12/19/2016   HGB 7.8 (L) 12/25/2016   HCT 25.1 (L) 12/25/2016   MCV 92.0 12/25/2016   PLT 92 (L) 12/25/2016      Chemistry      Component Value Date/Time   NA 147 (H) 12/25/2016 1148   K 4.3 12/25/2016 1148   CL 109 12/14/2016 0515   CO2 21 (L) 12/25/2016 1148   BUN 20.6 12/25/2016 1148   CREATININE 0.7 12/25/2016 1148      Component Value Date/Time   CALCIUM 8.4 12/25/2016 1148   ALKPHOS 111 12/25/2016 1148   AST 35 (H) 12/25/2016  1148   ALT 10 12/25/2016 1148   BILITOT 2.00 (H) 12/25/2016 1148       No results found for: LABCA2  No components found for: IRSWNI627  No results for input(s): INR in the last 168 hours.  No results found for: LABCA2  No results found for: OJJ009  No results found for: FGH829  No results found for: HBZ169  No results found for: CA2729  No components found for: HGQUANT  No results found for: CEA1 / No results found for: CEA1   No results found for: AFPTUMOR  No results found for: CHROMOGRNA  No results found for: PSA1  No visits with results within 3 Day(s) from this visit.  Latest known visit with results is:  Appointment on 12/25/2016  Component Date Value Ref Range Status  . WBC 12/25/2016 7.3  4.0 - 10.3 10e3/uL Final  . HGB 12/25/2016 7.8* 13.0 - 17.1 g/dL Final  . HCT 12/25/2016 25.1* 38.4 - 49.9 % Final  . Platelets 12/25/2016 92* 140 - 400 10e3/uL Final  . MCV 12/25/2016 92.0  79.3 - 98.0 fL Final  . MCH 12/25/2016 28.8  27.2 - 33.4 pg Final  . MCHC 12/25/2016 31.3* 32.0 - 36.0 g/dL Final  . RBC 12/25/2016 2.72* 4.20 - 5.82 10e6/uL Final  . RDW 12/25/2016 20.7* 11.0 - 14.6 % Final  . Sodium 12/25/2016 147* 136 - 145 mEq/L Final  . Potassium 12/25/2016 4.3  3.5 - 5.1 mEq/L Final  . Chloride 12/25/2016 115* 98 - 109 mEq/L Final  . CO2 12/25/2016 21* 22 - 29 mEq/L Final  . Glucose 12/25/2016 87  70 - 140 mg/dl Final   Glucose reference range is for nonfasting patients. Fasting glucose reference range is 70-  100.  Marland Kitchen BUN 12/25/2016 20.6  7.0 - 26.0 mg/dL Final  . Creatinine 12/25/2016 0.7  0.7 - 1.3 mg/dL Final  . Total Bilirubin 12/25/2016 2.00* 0.20 - 1.20 mg/dL Final  . Alkaline Phosphatase 12/25/2016 111  40 - 150 U/L Final  . AST 12/25/2016 35* 5 - 34 U/L Final  . ALT 12/25/2016 10  0 - 55 U/L Final  . Total Protein 12/25/2016 5.6* 6.4 - 8.3 g/dL Final  . Albumin 12/25/2016 2.1* 3.5 - 5.0 g/dL Final  . Calcium 12/25/2016 8.4  8.4 - 10.4 mg/dL Final   . Anion Gap 12/25/2016 11  3 - 11 mEq/L Final  . EGFR 12/25/2016 >60  >60 ml/min/1.73 m2 Final   eGFR is calculated using the CKD-EPI Creatinine Equation (2009)  . ANC ([facility] manual diff) 12/25/2016 3.7  1.5 - 6.5 10e3/uL Final  . ALC 12/25/2016 2.6  0.9 - 3.3 10e3/uL Final  . SEG 12/25/2016 44  38 - 77 % Final  . Band Neutrophils 12/25/2016 4  0 - 10 % Final  . LYMPH 12/25/2016 36  14 - 49 % Final  . MONO 12/25/2016 7  0 - 14 % Final  . Metamyelocytes 12/25/2016 2* 0 - 0 % Final  . Myelocytes 12/25/2016 1* 0 - 0 % Final  . Other Cell 12/25/2016 6 Large mononuclear cells with scant cytoplasm. * 0 - 0 % Final  . nRBC 12/25/2016 2* 0 - 0 % Final  . Polychromasia 12/25/2016 Slight  Slight Final  . Tear Drop Cells 12/25/2016 Few  Negative Final  . PLT EST 12/25/2016 Decreased  Adequate Final    (this displays the last labs from the last 3 days)  No results found for: TOTALPROTELP, ALBUMINELP, A1GS, A2GS, BETS, BETA2SER, GAMS, MSPIKE, SPEI (this displays SPEP labs)  No results found for: KPAFRELGTCHN, LAMBDASER, KAPLAMBRATIO (kappa/lambda light chains)  No results found for: HGBA, HGBA2QUANT, HGBFQUANT, HGBSQUAN (Hemoglobinopathy evaluation)   Lab Results  Component Value Date   LDH 226 (H) 12/13/2016    Lab Results  Component Value Date   IRON 15 (L) 11/28/2016   TIBC 190 (L) 11/28/2016   IRONPCTSAT 8 (L) 11/28/2016   (Iron and TIBC)  Lab Results  Component Value Date   FERRITIN 392 (H) 11/28/2016    Urinalysis    Component Value Date/Time   COLORURINE AMBER (A) 12/11/2016 2051   APPEARANCEUR HAZY (A) 12/11/2016 2051   LABSPEC 1.023 12/11/2016 2051   PHURINE 6.0 12/11/2016 2051   [place] 12/11/2016 2051   HGBUR NEGATIVE 12/11/2016 2051   BILIRUBINUR SMALL (A) 12/11/2016 2051   KETONESUR 5 (A) 12/11/2016 2051   PROTEINUR 100 (A) 12/11/2016 2051   NITRITE NEGATIVE 12/11/2016 2051   LEUKOCYTESUR NEGATIVE 12/11/2016 2051     STUDIES: Ct Humerus Right  Wo Contrast  Result Date: 12/13/2016 CLINICAL DATA:  Right upper arm mass.  History mantle cell lymphoma. EXAM: CT OF THE RIGHT HUMERUS WITHOUT CONTRAST TECHNIQUE: Multidetector CT imaging was performed according to the standard protocol. Multiplanar CT image reconstructions were also generated. COMPARISON:  CT chest dated November 27, 2016. FINDINGS: Bones/Joint/Cartilage No acute fracture or malalignment. Mild degenerative changes of the acromioclavicular and glenohumeral joints. Osteopenia. Ligaments Suboptimally assessed by CT. Muscles and Tendons Within the right biceps muscle, there is a large predominantly fat density mass measuring 10.2 x 11.6 x 16.5 cm. There is minimal internal stranding without focal nodularity. The rotator cuff is grossly intact. Soft tissues Markedly enlarged right axillary and lower right  cervical lymph nodes. Unchanged simple cysts in the liver. IMPRESSION: 1. Large, 16.5 cm predominantly fat density mass within the right biceps muscle, most consistent with a lipoma. Minimal internal stranding without focal nodularity makes liposarcoma unlikely. 2. Markedly enlarged right axillary and lower right cervical lymph nodes, consistent with history of mantle cell lymphoma. Electronically Signed   By: Titus Dubin M.D.   On: 12/13/2016 14:15   US Scrotum  Result Date: 12/13/2016 CLINICAL DATA:  Chronic scrotal edema EXAM: ULTRASOUND OF SCROTUM TECHNIQUE: Complete ultrasound examination of the testicles, epididymis, and other scrotal structures was performed. COMPARISON:  None. FINDINGS: Right testicle Measurements: 4.0 x 1.9 x 2.5 cm. No mass or microlithiasis visualized. Left testicle Measurements: 3.9 x 2.4 x 2.6 cm. No mass or microlithiasis visualized. Right epididymis:  4 mm cyst in the epididymal head. Left epididymis:  Normal in size and appearance. Hydrocele:  Bilateral hydroceles Varicocele:  None visualized. There is a complex fluid collection/cyst in the left scrotum  measuring 8.2 x 6.4 x 6.2 cm. Layering internal debris. This is lateral to the left testicle. Complex cystic area noted in the right inguinal canal with internal septations. IMPRESSION: No testicular abnormality. Complex cystic area within the right inguinal canal containing internal septations of unknown etiology. Large complex cystic area in the left lateral scrotum measuring up to 8.2 cm. This could reflect complex hydrocele or spermatocele. Electronically Signed   By: Rolm Baptise M.D.   On: 12/13/2016 13:27   Dg Chest Portable 1 View  Result Date: 12/11/2016 CLINICAL DATA:  Shortness of breath EXAM: PORTABLE CHEST 1 VIEW COMPARISON:  11/30/2016 FINDINGS: 1756 hours. The cardio pericardial silhouette is enlarged. The lungs are clear without focal pneumonia, edema, pneumothorax or pleural effusion. The visualized bony structures of the thorax are intact. Telemetry leads overlie the chest. IMPRESSION: Stable.  No acute cardiopulmonary findings. Electronically Signed   By: Misty Stanley M.D.   On: 12/11/2016 18:38   Ct Bone Marrow Biopsy  Result Date: 12/05/2016 INDICATION: 66 year old with mantle cell lymphoma. EXAM: CT GUIDED BONE MARROW ASPIRATES AND BIOPSY Physician: Stephan Minister. Anselm Pancoast, MD MEDICATIONS: None. ANESTHESIA/SEDATION: Fentanyl 50 mcg IV; Versed 1.0 mg IV Moderate Sedation Time:  12 minutes The patient was continuously monitored during the procedure by the interventional radiology nurse under my direct supervision. COMPLICATIONS: None immediate. PROCEDURE: The procedure was explained to the patient. The risks and benefits of the procedure were discussed and the patient's questions were addressed. Informed consent was obtained from the patient. The patient was placed prone on CT scan. Images of the pelvis were obtained. The right side of back was prepped and draped in sterile fashion. The skin and right posterior iliac bone were anesthetized with 1% lidocaine. 11 gauge bone needle was directed into  the right iliac bone with CT guidance. Two aspirates and one core biopsy obtained. Bandage placed over the puncture site. FINDINGS: Bone needle directed in the right ilium. Extensive lymphadenopathy throughout the pelvis and inguinal regions. IMPRESSION: CT guided bone marrow aspirates and core biopsy. Electronically Signed   By: Markus Daft M.D.   On: 12/05/2016 10:19   ASSESSMENT: 66 y.o. 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  We still have not been able to get him quite to full dose partly because he still has reactions, although the reaction last week was quite minor, and partly because we have not been able to get him here before the afternoon.  These are very long infusions and it would be much better if we could start by 10 AM.  We will see if that becomes possible in January--his appointment for next week is also for the afternoon  The list of medications that we have and the ones that he brought from [facility] do not match.  He is supposed to be on allopurinol and amiodarone among other things and I do not see that on this list.  It is possible that 1 or 2 medication pages got lost on the way and I am asking 1 of the  nurses and infusion to please call and confirm that the lists to do match.  Next week the patient will receive rituximab again and then on June 3 he will receive rituximab and if we had it approved for him we will start ibrutinib.  I think this may simplify his life considerably.  Of course with ibrutinib we do have to worry regarding cardiac rhythm issues and bleeding.  He gives me because for concern on both counts.  However if we can get him safely on to ibrutinib we can begin to decrease the frequency of rituximab and certainly that would be easier on him overall.     Jshawn Hurta, [name], MD  01/11/2017 12:48 PM Medical Oncology and Hematology [facility] [address] [place], [place] [address] Tel. [phone]    Fax. [phone]    This document serves as a record of services personally performed by Lurline Del, MD. It was created on his behalf by Steva Colder, a trained medical scribe. The creation of this record is based on the scribe's personal observations and the provider's statements to them.   I have reviewed the above documentation for accuracy and completeness, and I agree with the above.

## 2017-01-01 ENCOUNTER — Telehealth: Payer: Self-pay | Admitting: Pharmacy Technician

## 2017-01-01 ENCOUNTER — Telehealth: Payer: Self-pay

## 2017-01-01 ENCOUNTER — Observation Stay (HOSPITAL_COMMUNITY): Payer: Medicare Other

## 2017-01-01 ENCOUNTER — Other Ambulatory Visit (HOSPITAL_BASED_OUTPATIENT_CLINIC_OR_DEPARTMENT_OTHER): Payer: Medicare Other

## 2017-01-01 ENCOUNTER — Inpatient Hospital Stay (HOSPITAL_COMMUNITY)
Admission: EM | Admit: 2017-01-01 | Discharge: 2017-02-13 | DRG: 064 | Disposition: E | Payer: Medicare Other | Attending: Internal Medicine | Admitting: Internal Medicine

## 2017-01-01 ENCOUNTER — Encounter (HOSPITAL_COMMUNITY): Payer: Self-pay

## 2017-01-01 ENCOUNTER — Ambulatory Visit (HOSPITAL_BASED_OUTPATIENT_CLINIC_OR_DEPARTMENT_OTHER): Payer: Medicare Other | Admitting: Oncology

## 2017-01-01 ENCOUNTER — Other Ambulatory Visit: Payer: Self-pay | Admitting: *Deleted

## 2017-01-01 ENCOUNTER — Ambulatory Visit (HOSPITAL_BASED_OUTPATIENT_CLINIC_OR_DEPARTMENT_OTHER): Payer: Medicare Other

## 2017-01-01 ENCOUNTER — Ambulatory Visit (HOSPITAL_COMMUNITY)
Admission: RE | Admit: 2017-01-01 | Discharge: 2017-01-01 | Disposition: A | Discharge status: Home / Self Care | Payer: Medicare Other | Source: Ambulatory Visit | Attending: Oncology | Admitting: Oncology

## 2017-01-01 ENCOUNTER — Emergency Department (HOSPITAL_COMMUNITY): Payer: Medicare Other

## 2017-01-01 ENCOUNTER — Telehealth: Payer: Self-pay | Admitting: *Deleted

## 2017-01-01 VITALS — BP 83/68 | HR 108 | Temp 97.6°F | Resp 17 | Ht 72.0 in

## 2017-01-01 VITALS — BP 65/42 | HR 73 | Resp 16

## 2017-01-01 DIAGNOSIS — Z7982 Long term (current) use of aspirin: Secondary | ICD-10-CM

## 2017-01-01 DIAGNOSIS — C8311 Mantle cell lymphoma, lymph nodes of head, face, and neck: Secondary | ICD-10-CM

## 2017-01-01 DIAGNOSIS — D649 Anemia, unspecified: Secondary | ICD-10-CM

## 2017-01-01 DIAGNOSIS — Z800 Family history of malignant neoplasm of digestive organs: Secondary | ICD-10-CM

## 2017-01-01 DIAGNOSIS — I890 Lymphedema, not elsewhere classified: Secondary | ICD-10-CM

## 2017-01-01 DIAGNOSIS — G9389 Other specified disorders of brain: Secondary | ICD-10-CM | POA: Diagnosis present

## 2017-01-01 DIAGNOSIS — R61 Generalized hyperhidrosis: Secondary | ICD-10-CM

## 2017-01-01 DIAGNOSIS — I639 Cerebral infarction, unspecified: Principal | ICD-10-CM | POA: Diagnosis present

## 2017-01-01 DIAGNOSIS — R0603 Acute respiratory distress: Secondary | ICD-10-CM

## 2017-01-01 DIAGNOSIS — Z515 Encounter for palliative care: Secondary | ICD-10-CM | POA: Diagnosis present

## 2017-01-01 DIAGNOSIS — R451 Restlessness and agitation: Secondary | ICD-10-CM | POA: Diagnosis not present

## 2017-01-01 DIAGNOSIS — Z87891 Personal history of nicotine dependence: Secondary | ICD-10-CM

## 2017-01-01 DIAGNOSIS — C8310 Mantle cell lymphoma, unspecified site: Secondary | ICD-10-CM

## 2017-01-01 DIAGNOSIS — W19XXXA Unspecified fall, initial encounter: Secondary | ICD-10-CM | POA: Diagnosis not present

## 2017-01-01 DIAGNOSIS — J810 Acute pulmonary edema: Secondary | ICD-10-CM | POA: Diagnosis not present

## 2017-01-01 DIAGNOSIS — Y95 Nosocomial condition: Secondary | ICD-10-CM | POA: Diagnosis not present

## 2017-01-01 DIAGNOSIS — E872 Acidosis: Secondary | ICD-10-CM | POA: Diagnosis not present

## 2017-01-01 DIAGNOSIS — I4891 Unspecified atrial fibrillation: Secondary | ICD-10-CM | POA: Diagnosis present

## 2017-01-01 DIAGNOSIS — D6481 Anemia due to antineoplastic chemotherapy: Secondary | ICD-10-CM | POA: Diagnosis present

## 2017-01-01 DIAGNOSIS — D6959 Other secondary thrombocytopenia: Secondary | ICD-10-CM | POA: Diagnosis present

## 2017-01-01 DIAGNOSIS — Z6839 Body mass index (BMI) 39.0-39.9, adult: Secondary | ICD-10-CM

## 2017-01-01 DIAGNOSIS — F411 Generalized anxiety disorder: Secondary | ICD-10-CM | POA: Diagnosis present

## 2017-01-01 DIAGNOSIS — I110 Hypertensive heart disease with heart failure: Secondary | ICD-10-CM | POA: Diagnosis present

## 2017-01-01 DIAGNOSIS — Q040 Congenital malformations of corpus callosum: Secondary | ICD-10-CM

## 2017-01-01 DIAGNOSIS — A419 Sepsis, unspecified organism: Secondary | ICD-10-CM | POA: Diagnosis not present

## 2017-01-01 DIAGNOSIS — J90 Pleural effusion, not elsewhere classified: Secondary | ICD-10-CM | POA: Diagnosis not present

## 2017-01-01 DIAGNOSIS — Z781 Physical restraint status: Secondary | ICD-10-CM

## 2017-01-01 DIAGNOSIS — I959 Hypotension, unspecified: Secondary | ICD-10-CM

## 2017-01-01 DIAGNOSIS — R0902 Hypoxemia: Secondary | ICD-10-CM

## 2017-01-01 DIAGNOSIS — E785 Hyperlipidemia, unspecified: Secondary | ICD-10-CM | POA: Diagnosis present

## 2017-01-01 DIAGNOSIS — I10 Essential (primary) hypertension: Secondary | ICD-10-CM

## 2017-01-01 DIAGNOSIS — I5032 Chronic diastolic (congestive) heart failure: Secondary | ICD-10-CM | POA: Diagnosis present

## 2017-01-01 DIAGNOSIS — J80 Acute respiratory distress syndrome: Secondary | ICD-10-CM | POA: Diagnosis not present

## 2017-01-01 DIAGNOSIS — J811 Chronic pulmonary edema: Secondary | ICD-10-CM

## 2017-01-01 DIAGNOSIS — R4182 Altered mental status, unspecified: Secondary | ICD-10-CM | POA: Diagnosis not present

## 2017-01-01 DIAGNOSIS — J189 Pneumonia, unspecified organism: Secondary | ICD-10-CM | POA: Diagnosis not present

## 2017-01-01 DIAGNOSIS — D630 Anemia in neoplastic disease: Secondary | ICD-10-CM | POA: Diagnosis present

## 2017-01-01 DIAGNOSIS — E876 Hypokalemia: Secondary | ICD-10-CM | POA: Diagnosis present

## 2017-01-01 DIAGNOSIS — R591 Generalized enlarged lymph nodes: Secondary | ICD-10-CM | POA: Diagnosis present

## 2017-01-01 DIAGNOSIS — I482 Chronic atrial fibrillation: Secondary | ICD-10-CM | POA: Diagnosis present

## 2017-01-01 DIAGNOSIS — Z8249 Family history of ischemic heart disease and other diseases of the circulatory system: Secondary | ICD-10-CM

## 2017-01-01 DIAGNOSIS — T451X5A Adverse effect of antineoplastic and immunosuppressive drugs, initial encounter: Secondary | ICD-10-CM | POA: Diagnosis present

## 2017-01-01 DIAGNOSIS — I272 Pulmonary hypertension, unspecified: Secondary | ICD-10-CM | POA: Diagnosis present

## 2017-01-01 DIAGNOSIS — N179 Acute kidney failure, unspecified: Secondary | ICD-10-CM

## 2017-01-01 DIAGNOSIS — I951 Orthostatic hypotension: Secondary | ICD-10-CM | POA: Diagnosis present

## 2017-01-01 DIAGNOSIS — Z66 Do not resuscitate: Secondary | ICD-10-CM

## 2017-01-01 DIAGNOSIS — L8990 Pressure ulcer of unspecified site, unspecified stage: Secondary | ICD-10-CM

## 2017-01-01 DIAGNOSIS — Z8673 Personal history of transient ischemic attack (TIA), and cerebral infarction without residual deficits: Secondary | ICD-10-CM

## 2017-01-01 DIAGNOSIS — Z9181 History of falling: Secondary | ICD-10-CM

## 2017-01-01 DIAGNOSIS — N170 Acute kidney failure with tubular necrosis: Secondary | ICD-10-CM | POA: Diagnosis present

## 2017-01-01 DIAGNOSIS — E860 Dehydration: Secondary | ICD-10-CM | POA: Diagnosis present

## 2017-01-01 LAB — CBC WITH DIFFERENTIAL/PLATELET
BASOS PCT: 0 %
Basophils Absolute: 0 10*3/uL (ref 0.0–0.1)
EOS PCT: 0 %
Eosinophils Absolute: 0 10*3/uL (ref 0.0–0.7)
HEMATOCRIT: 22 % — AB (ref 38.4–49.9)
HEMATOCRIT: 20.1 % — AB (ref 39.0–52.0)
HEMOGLOBIN: 6.8 g/dL — AB (ref 13.0–17.1)
HEMOGLOBIN: 6.2 g/dL — AB (ref 13.0–17.0)
Lymphocytes Relative: 30 %
Lymphs Abs: 2 10*3/uL (ref 0.7–4.0)
MCH: 29.8 pg (ref 27.2–33.4)
MCH: 30.2 pg (ref 26.0–34.0)
MCHC: 30.7 g/dL — ABNORMAL LOW (ref 32.0–36.0)
MCHC: 30.8 g/dL (ref 30.0–36.0)
MCV: 97 fL (ref 79.3–98.0)
MCV: 98 fL (ref 78.0–100.0)
MYELOCYTES: 2 %
Monocytes Absolute: 0.6 10*3/uL (ref 0.1–1.0)
Monocytes Relative: 9 %
NRBC: 3 /100{WBCs} — AB
Neutro Abs: 3.5 10*3/uL (ref 1.7–7.7)
Neutrophils Relative %: 50 %
OTHER: 9 %
Platelets: 82 10*3/uL — ABNORMAL LOW (ref 140–400)
Platelets: 75 10*3/uL — ABNORMAL LOW (ref 150–400)
RBC: 2.27 10*6/uL — ABNORMAL LOW (ref 4.20–5.82)
RBC: 2.05 MIL/uL — ABNORMAL LOW (ref 4.22–5.81)
RDW: 22.2 % — ABNORMAL HIGH (ref 11.0–14.6)
RDW: 21.7 % — ABNORMAL HIGH (ref 11.5–15.5)
WBC: 8.4 10*3/uL (ref 4.0–10.3)
WBC: 6.7 10*3/uL (ref 4.0–10.5)

## 2017-01-01 LAB — COMPREHENSIVE METABOLIC PANEL
ALBUMIN: 2.1 g/dL — AB (ref 3.5–5.0)
ALK PHOS: 110 U/L (ref 40–150)
ALT: 12 U/L (ref 0–55)
ALT: 15 U/L — AB (ref 17–63)
AST: 32 U/L (ref 5–34)
AST: 36 U/L (ref 15–41)
Albumin: 2.1 g/dL — ABNORMAL LOW (ref 3.5–5.0)
Alkaline Phosphatase: 88 U/L (ref 38–126)
Anion Gap: 12 mEq/L — ABNORMAL HIGH (ref 3–11)
Anion gap: 7 (ref 5–15)
BILIRUBIN TOTAL: 1.83 mg/dL — AB (ref 0.20–1.20)
BUN: 21.9 mg/dL (ref 7.0–26.0)
BUN: 24 mg/dL — AB (ref 6–20)
CHLORIDE: 111 mmol/L (ref 101–111)
CO2: 19 mEq/L — ABNORMAL LOW (ref 22–29)
CO2: 23 mmol/L (ref 22–32)
CREATININE: 1.29 mg/dL — AB (ref 0.61–1.24)
Calcium: 8.3 mg/dL — ABNORMAL LOW (ref 8.4–10.4)
Calcium: 8.1 mg/dL — ABNORMAL LOW (ref 8.9–10.3)
Chloride: 111 mEq/L — ABNORMAL HIGH (ref 98–109)
Creatinine: 1 mg/dL (ref 0.7–1.3)
EGFR: >60 mL/min/{1.73_m2} (ref >60)
GFR calc Af Amer: >60 mL/min (ref >60)
GFR, EST NON AFRICAN AMERICAN: 56 mL/min — AB (ref >60)
GLUCOSE: 100 mg/dL (ref 70–140)
GLUCOSE: 103 mg/dL — AB (ref 65–99)
POTASSIUM: 4.5 meq/L (ref 3.5–5.1)
Potassium: 4.2 mmol/L (ref 3.5–5.1)
SODIUM: 143 meq/L (ref 136–145)
Sodium: 141 mmol/L (ref 135–145)
TOTAL PROTEIN: 5.2 g/dL — AB (ref 6.4–8.3)
Total Bilirubin: 1.6 mg/dL — ABNORMAL HIGH (ref 0.3–1.2)
Total Protein: 5.2 g/dL — ABNORMAL LOW (ref 6.5–8.1)

## 2017-01-01 LAB — MANUAL DIFFERENTIAL
ALC: 3.6 10*3/uL — ABNORMAL HIGH (ref 0.9–3.3)
ANC (CHCC manual diff): 3.3 10*3/uL (ref 1.5–6.5)
Band Neutrophils: 0 % (ref 0–10)
Basophil: 0 % (ref 0–2)
Blasts: 0 % (ref 0–0)
EOS: 1 % (ref 0–7)
LYMPH: 43 % (ref 14–49)
METAMYELOCYTES PCT: 0 % (ref 0–0)
MONO: 4 % (ref 0–14)
Myelocytes: 0 % (ref 0–0)
NRBC: 2 % — AB (ref 0–0)
OTHER CELL: 13 % — AB (ref 0–0)
PLT EST: DECREASED
PROMYELO: 0 % (ref 0–0)
SEG: 39 % (ref 38–77)
Variant Lymph: 0 % (ref 0–0)

## 2017-01-01 LAB — I-STAT CHEM 8, ED
BUN: 20 mg/dL (ref 6–20)
CREATININE: 1.2 mg/dL (ref 0.61–1.24)
Calcium, Ion: 1.12 mmol/L — ABNORMAL LOW (ref 1.15–1.40)
Chloride: 108 mmol/L (ref 101–111)
Glucose, Bld: 100 mg/dL — ABNORMAL HIGH (ref 65–99)
HEMATOCRIT: 19 % — AB (ref 39.0–52.0)
Hemoglobin: 6.5 g/dL — CL (ref 13.0–17.0)
POTASSIUM: 4.2 mmol/L (ref 3.5–5.1)
Sodium: 142 mmol/L (ref 135–145)
TCO2: 22 mmol/L (ref 22–32)

## 2017-01-01 LAB — I-STAT CG4 LACTIC ACID, ED
LACTIC ACID, VENOUS: 1.47 mmol/L (ref 0.5–1.9)
Lactic Acid, Venous: 2.58 mmol/L (ref 0.5–1.9)

## 2017-01-01 LAB — PREPARE RBC (CROSSMATCH)

## 2017-01-01 LAB — TISSUE HYBRIDIZATION (BONE MARROW)-NCBH

## 2017-01-01 LAB — ABO/RH: ABO/RH(D): A POS

## 2017-01-01 LAB — LACTATE DEHYDROGENASE: LDH: 434 U/L — ABNORMAL HIGH (ref 125–245)

## 2017-01-01 MED ORDER — ACETAMINOPHEN 500 MG PO TABS
500.0000 mg | ORAL_TABLET | Freq: Four times a day (QID) | ORAL | Status: DC | PRN
  Administered 2017-01-02 – 2017-01-12 (×11): 500 mg via ORAL
  Filled 2017-01-01 (×12): qty 1

## 2017-01-01 MED ORDER — IBRUTINIB 420 MG PO TABS: 420.0000 mg | ORAL_TABLET | ORAL | 6 refills | Status: AC

## 2017-01-01 MED ORDER — SODIUM CHLORIDE 0.9 % IV SOLN
300.0000 mg | Freq: Once | INTRAVENOUS | Status: DC
  Filled 2017-01-01: qty 30

## 2017-01-01 MED ORDER — SODIUM CHLORIDE 0.9 % IV BOLUS (SEPSIS)
1000.0000 mL | Freq: Once | INTRAVENOUS | Status: AC
  Administered 2017-01-01: 1000 mL via INTRAVENOUS

## 2017-01-01 MED ORDER — LORAZEPAM 1 MG PO TABS: 0.5000 mg | ORAL_TABLET | Freq: Once | ORAL | Status: DC

## 2017-01-01 MED ORDER — FOLIC ACID 1 MG PO TABS
1.0000 mg | ORAL_TABLET | Freq: Every day | ORAL | Status: DC
  Administered 2017-01-02 – 2017-01-12 (×11): 1 mg via ORAL
  Filled 2017-01-01 (×11): qty 1

## 2017-01-01 MED ORDER — PANTOPRAZOLE SODIUM 40 MG PO TBEC
40.0000 mg | DELAYED_RELEASE_TABLET | Freq: Every day | ORAL | Status: DC
  Administered 2017-01-02 – 2017-01-12 (×10): 40 mg via ORAL
  Filled 2017-01-01 (×10): qty 1

## 2017-01-01 MED ORDER — PANTOPRAZOLE SODIUM 40 MG PO TBEC
40.0000 mg | DELAYED_RELEASE_TABLET | Freq: Every day | ORAL | Status: DC
  Administered 2017-01-02 – 2017-01-03 (×2): 40 mg via ORAL
  Filled 2017-01-01 (×2): qty 1

## 2017-01-01 MED ORDER — LORAZEPAM 2 MG/ML IJ SOLN
0.5000 mg | Freq: Once | INTRAMUSCULAR | Status: AC
  Administered 2017-01-01: 0.5 mg via INTRAVENOUS

## 2017-01-01 MED ORDER — SODIUM CHLORIDE 0.9 % IV SOLN
INTRAVENOUS | Status: DC
  Administered 2017-01-02: 100 mL/h via INTRAVENOUS
  Administered 2017-01-02 – 2017-01-11 (×4): via INTRAVENOUS

## 2017-01-01 MED ORDER — FERROUS SULFATE 325 (65 FE) MG PO TABS
325.0000 mg | ORAL_TABLET | Freq: Every day | ORAL | Status: DC
  Administered 2017-01-02 – 2017-01-12 (×11): 325 mg via ORAL
  Filled 2017-01-01 (×12): qty 1

## 2017-01-01 MED ORDER — DEXAMETHASONE 4 MG PO TABS: 20.0000 mg | ORAL_TABLET | Freq: Once | ORAL | Status: DC

## 2017-01-01 MED ORDER — CHLORHEXIDINE GLUCONATE CLOTH 2 % EX PADS
6.0000 | MEDICATED_PAD | Freq: Every day | CUTANEOUS | Status: AC
  Administered 2017-01-02 – 2017-01-06 (×4): 6 via TOPICAL

## 2017-01-01 MED ORDER — ACETAMINOPHEN 325 MG PO TABS
ORAL_TABLET | ORAL | Status: AC
  Filled 2017-01-01: qty 2

## 2017-01-01 MED ORDER — SODIUM CHLORIDE 0.9 % IV SOLN: Freq: Once | INTRAVENOUS | Status: DC

## 2017-01-01 MED ORDER — DIPHENHYDRAMINE HCL 50 MG/ML IJ SOLN
25.0000 mg | Freq: Once | INTRAMUSCULAR | Status: AC
  Administered 2017-01-01: 25 mg via INTRAVENOUS

## 2017-01-01 MED ORDER — ALLOPURINOL 300 MG PO TABS
300.0000 mg | ORAL_TABLET | Freq: Every day | ORAL | Status: DC
  Administered 2017-01-02 – 2017-01-12 (×11): 300 mg via ORAL
  Filled 2017-01-01 (×5): qty 3
  Filled 2017-01-01: qty 1
  Filled 2017-01-01 (×6): qty 3

## 2017-01-01 MED ORDER — AMIODARONE HCL 200 MG PO TABS
200.0000 mg | ORAL_TABLET | Freq: Every day | ORAL | Status: DC
  Administered 2017-01-02 – 2017-01-12 (×11): 200 mg via ORAL
  Filled 2017-01-01 (×12): qty 1

## 2017-01-01 MED ORDER — DIPHENHYDRAMINE HCL 25 MG PO CAPS
ORAL_CAPSULE | ORAL | Status: AC
  Filled 2017-01-01: qty 1

## 2017-01-01 MED ORDER — SODIUM CHLORIDE 0.9 % IV SOLN
Freq: Once | INTRAVENOUS | Status: AC
  Administered 2017-01-01: 14:00:00 via INTRAVENOUS

## 2017-01-01 MED ORDER — SODIUM CHLORIDE 0.9 % IV SOLN
20.0000 mg | Freq: Once | INTRAVENOUS | Status: AC
  Administered 2017-01-01: 20 mg via INTRAVENOUS
  Filled 2017-01-01: qty 2

## 2017-01-01 MED ORDER — ONDANSETRON HCL 4 MG/2ML IJ SOLN
4.0000 mg | Freq: Four times a day (QID) | INTRAMUSCULAR | Status: DC | PRN
  Filled 2017-01-01: qty 2

## 2017-01-01 MED ORDER — ATORVASTATIN CALCIUM 10 MG PO TABS
20.0000 mg | ORAL_TABLET | Freq: Every day | ORAL | Status: DC
  Administered 2017-01-02 – 2017-01-12 (×10): 20 mg via ORAL
  Filled 2017-01-01 (×11): qty 2

## 2017-01-01 MED ORDER — SIMVASTATIN 40 MG PO TABS: 40.0000 mg | ORAL_TABLET | Freq: Every day | ORAL | Status: DC

## 2017-01-01 MED ORDER — MUPIROCIN 2 % EX OINT
1.0000 "application " | TOPICAL_OINTMENT | Freq: Two times a day (BID) | CUTANEOUS | Status: AC
  Administered 2017-01-02 – 2017-01-06 (×6): 1 via NASAL
  Filled 2017-01-01 (×2): qty 22

## 2017-01-01 MED ORDER — LORAZEPAM 2 MG/ML IJ SOLN
INTRAMUSCULAR | Status: AC
  Filled 2017-01-01: qty 1

## 2017-01-01 MED ORDER — DIPHENHYDRAMINE HCL 25 MG PO CAPS: 25.0000 mg | ORAL_CAPSULE | Freq: Once | ORAL | Status: DC

## 2017-01-01 MED ORDER — ACETAMINOPHEN 325 MG PO TABS
650.0000 mg | ORAL_TABLET | Freq: Once | ORAL | Status: AC
  Administered 2017-01-01: 650 mg via ORAL

## 2017-01-01 MED ORDER — ONDANSETRON HCL 4 MG PO TABS: 4.0000 mg | ORAL_TABLET | Freq: Four times a day (QID) | ORAL | Status: DC | PRN

## 2017-01-01 MED ORDER — DIPHENHYDRAMINE HCL 50 MG/ML IJ SOLN
INTRAMUSCULAR | Status: AC
  Filled 2017-01-01: qty 1

## 2017-01-01 MED ORDER — LORAZEPAM 1 MG PO TABS
ORAL_TABLET | ORAL | Status: AC
  Filled 2017-01-01: qty 1

## 2017-01-01 NOTE — ED Notes (Signed)
Pt transported to XR.  

## 2017-01-01 NOTE — ED Notes (Signed)
Assigned 1412 @ 18:20 call report @ 18:40

## 2017-01-01 NOTE — Progress Notes (Signed)
Only able to obtain about half of brain mri without. Pt too confused to have a mri at current state. Pt started to scream while in scanner and beat the sides of the scanner wanting out.

## 2017-01-01 NOTE — ED Notes (Signed)
ED Provider at bedside. 

## 2017-01-01 NOTE — ED Notes (Signed)
Bed: MB01 Expected date:  Expected time:  Means of arrival:  Comments: Cancer center patient-hypotensive

## 2017-01-01 NOTE — Patient Instructions (Signed)
[facility] Discharge Instructions for Patients Receiving Chemotherapy  Today you received the following chemotherapy agents:  Rituxan.  To help prevent nausea and vomiting after your treatment, we encourage you to take your nausea medication as directed.   If you develop nausea and vomiting that is not controlled by your nausea medication, call the clinic.   BELOW ARE SYMPTOMS THAT SHOULD BE REPORTED IMMEDIATELY:  *FEVER GREATER THAN 100.5 F  *CHILLS WITH OR WITHOUT FEVER  NAUSEA AND VOMITING THAT IS NOT CONTROLLED WITH YOUR NAUSEA MEDICATION  *UNUSUAL SHORTNESS OF BREATH  *UNUSUAL BRUISING OR BLEEDING  TENDERNESS IN MOUTH AND THROAT WITH OR WITHOUT PRESENCE OF ULCERS  *URINARY PROBLEMS  *BOWEL PROBLEMS  UNUSUAL RASH Items with * indicate a potential emergency and should be followed up as soon as possible.  Feel free to call the clinic should you have any questions or concerns. The clinic phone number is [phone].  Please show the CHEMO ALERT CARD at check-in to the Emergency Department and triage nurse.   

## 2017-01-01 NOTE — H&P (Addendum)
History and Physical    [name] [name]:[id number] DOB: 01/17/1950 DOA: 01/10/2017  PCP: [name] Low, MD  Patient coming from: oncology office.   I have personally briefly reviewed patient's old medical records in [place]  Chief Complaint: dizziness and near syncope today.   HPI: [name] [name] is a 66 y.o. 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 Pt also reports he had a fall yesterday an dhit his head at [facility]. After arrival to ED he was found to be hypotensive, tachycardic, and he underwent cbc showing hemoglobin around 6,  Platelets of 75, and creatinine of 1.2 from basleine less than 1, and elevated lactic acid. He currently denies any chest pain , sob, nausea, or vomiting or abdominal pain. He denies fevers or chillls, myalgias, tingling or numbness. He is not clear whether he had a syncopal episode yesterday or a mechanical fall. No hematochezia or hemoptysis.  No urinary symptoms.  No diarrhea. He reports mild headache.  He was referred to medical service for admission.    Review of Systems: all others reviwed and are negative.   Past Medical History:  Diagnosis Date  . Bladder tumor   . Cataract   . CVA (cerebral infarction)    2008  . Hyperlipidemia   . Hypertension     Past Surgical History:  Procedure Laterality Date  . CHOLECYSTECTOMY    . EYE SURGERY    . FLEXIBLE SIGMOIDOSCOPY N/A 11/30/2013   Procedure: FLEXIBLE SIGMOIDOSCOPY;  Surgeon: Inda Castle, MD;  Location: [facility] ENDOSCOPY;  Service: Endoscopy;  Laterality: N/A;  . HOT HEMOSTASIS N/A 11/30/2013   Procedure: HOT HEMOSTASIS (ARGON PLASMA COAGULATION/BICAP);  Surgeon: Inda Castle, MD;  Location: [facility];  Service: Endoscopy;  Laterality: N/A;    . MOUTH SURGERY       reports that he quit smoking about 31 years ago. he has never used smokeless tobacco. He reports that he does not drink alcohol or use drugs.  No Known Allergies  Family History  Problem Relation Age of Onset  . Colon cancer Father   . Colon cancer Sister 33  . Hypertension Sister   family history reviewed.   Prior to Admission medications   Medication Sig Start Date End Date Taking? Authorizing Provider  acetaminophen (TYLENOL) 500 MG tablet Take 500 mg by mouth every 6 (six) hours as needed.   Yes [provider]  acetaminophen (TYLENOL) 650 MG CR tablet Take 650 mg every 8 (eight) hours as needed by mouth (for pain or fever ("for 48 hours")).   Yes [provider]  allopurinol (ZYLOPRIM) 300 MG tablet Take 1 tablet (300 mg total) by mouth daily. 12/07/16  Yes Samuella Cota, MD  amiodarone (PACERONE) 200 MG tablet Take 2 tablets (400 mg total) by mouth 2 (two) times daily for 4 days, THEN 1 tablet (200 mg total) daily. Patient taking differently: Take 1 Tablet (200 mg) by mouth daily 12/06/16 01/09/17 Yes Samuella Cota, MD  aspirin 81 MG chewable tablet Chew 1 tablet (81 mg total) by mouth daily. 12/07/16  Yes Samuella Cota, MD  Cholecalciferol (VITAMIN D-3) 1000 units CAPS Take 1,000 Units by mouth daily.    Yes [provider]  diltiazem (CARDIZEM CD) 240 MG 24 hr capsule Take  1 capsule (240 mg total) by mouth daily. 12/07/16  Yes Samuella Cota, MD  docusate sodium (COLACE) 100 MG capsule Take 100 mg by mouth 2 (two) times daily. For constipation   Yes [provider]  ferrous sulfate 325 (65 FE) MG tablet Take 325 mg by mouth daily with breakfast.    Yes [provider]  folic acid (FOLVITE) 1 MG tablet Take 1 tablet (1 mg total) by mouth daily. 12/15/16  Yes Florencia Reasons, MD  ibuprofen (ADVIL,MOTRIN) 400 MG tablet Take 400 mg by mouth every 6 (six) hours as needed for moderate pain.  10/09/16  Yes  [provider]  lisinopril (ZESTRIL) 2.5 MG tablet Take 1 tablet (2.5 mg total) by mouth daily. 12/14/16 12/14/17 Yes Florencia Reasons, MD  loratadine (CLARITIN) 10 MG tablet Take 10 mg daily by mouth.   Yes [provider]  metoprolol tartrate (LOPRESSOR) 50 MG tablet Take 1 tablet (50 mg total) by mouth 2 (two) times daily. 12/06/16  Yes Samuella Cota, MD  mupirocin ointment (BACTROBAN) 2 % Place 1 application into the nose 2 (two) times daily. 12/14/16  Yes Florencia Reasons, MD  omeprazole (PRILOSEC) 20 MG capsule Take 1 capsule by mouth daily. 09/17/13  Yes [provider]  simvastatin (ZOCOR) 40 MG tablet Take 40 mg by mouth at bedtime.   Yes [provider]  Chlorhexidine Gluconate Cloth 2 % PADS Apply 6 each topically daily at 6 (six) AM. Patient not taking: Reported on 12/31/2016 12/15/16   Florencia Reasons, MD  Ibrutinib 420 MG TABS Take 420 mg by mouth 1 day or 1 dose for 1 dose. Patient not taking: Reported on 01/12/2017 01/02/2017 01/02/17  Chauncey Cruel, MD    Physical Exam: Vitals:   12/26/2016 1543 12/19/2016 1645 12/29/2016 1715 01/05/2017 1800  BP: (!) 83/69 93/63 102/64 (!) 87/72  Pulse: 69 66 75 76  Resp: 16 18 (!) 21 13  SpO2: 99% 97% 93% 97%    Constitutional: NAD, calm, comfortable Vitals:   12/21/2016 1543 01/08/2017 1645 12/25/2016 1715 01/03/2017 1800  BP: (!) 83/69 93/63 102/64 (!) 87/72  Pulse: 69 66 75 76  Resp: 16 18 (!) 21 13  SpO2: 99% 97% 93% 97%   Eyes: PERRL, lids and conjunctivae pale ENMT: Mucous membranes are dry Posterior pharynx clear of any exudate or lesions.Normal dentition.  Neck: neck mass on the right side of the neck.  Respiratory: clear to auscultation bilaterally, no wheezing, no crackles. Normal respiratory effort. No accessory muscle use.  Cardiovascular:irregular, tachycardic, no murmer.  Abdomen: no tenderness, no masses palpated. No hepatosplenomegaly. Bowel sounds positive.  Musculoskeletal: right upper extremity swollen, non  tender.    Skin: no rashes, lesions, ulcers. No induration Neurologic: CN 2-12 grossly intact. Sensation intact, DTR normal. Strength 5/5 in all 4.  Psychiatric:. Alert and oriented x 3. Normal mood.     Labs on Admission: I have personally reviewed following labs and imaging studies  CBC: Recent Labs  Lab 12/16/2016 1217 12/31/2016 1511 12/20/2016 1536  WBC 8.4 6.7  --   NEUTROABS  --  3.5  --   HGB 6.8* 6.2* 6.5*  HCT 22.0* 20.1* 19.0*  MCV 97.0 98.0  --   PLT 82* 75*  --    Basic Metabolic Panel: Recent Labs  Lab 01/12/2017 1217 12/14/2016 1511 12/17/2016 1536  NA 143 141 142  K 4.5 4.2 4.2  CL  --  111 108  CO2 19* 23  --  GLUCOSE 100 103* 100*  BUN 21.9 24* 20  CREATININE 1.0 1.29* 1.20  CALCIUM 8.3* 8.1*  --    GFR: CrCl cannot be calculated (Unknown ideal weight.). Liver Function Tests: Recent Labs  Lab 12/21/2016 1217 01/06/2017 1511  AST 32 36  ALT 12 15*  ALKPHOS 110 88  BILITOT 1.83* 1.6*  PROT 5.2* 5.2*  ALBUMIN 2.1* 2.1*   No results for input(s): LIPASE, AMYLASE in the last 168 hours. No results for input(s): AMMONIA in the last 168 hours. Coagulation Profile: No results for input(s): INR, PROTIME in the last 168 hours. Cardiac Enzymes: No results for input(s): CKTOTAL, CKMB, CKMBINDEX, TROPONINI in the last 168 hours. BNP (last 3 results) No results for input(s): PROBNP in the last 8760 hours. HbA1C: No results for input(s): HGBA1C in the last 72 hours. CBG: No results for input(s): GLUCAP in the last 168 hours. Lipid Profile: No results for input(s): CHOL, HDL, LDLCALC, TRIG, CHOLHDL, LDLDIRECT in the last 72 hours. Thyroid Function Tests: No results for input(s): TSH, T4TOTAL, FREET4, T3FREE, THYROIDAB in the last 72 hours. Anemia Panel: No results for input(s): VITAMINB12, FOLATE, FERRITIN, TIBC, IRON, RETICCTPCT in the last 72 hours. Urine analysis:    Component Value Date/Time   COLORURINE AMBER (A) 12/11/2016 2051   APPEARANCEUR HAZY (A)  12/11/2016 2051   LABSPEC 1.023 12/11/2016 2051   PHURINE 6.0 12/11/2016 2051   GLUCOSEU NEGATIVE 12/11/2016 2051   HGBUR NEGATIVE 12/11/2016 2051   BILIRUBINUR SMALL (A) 12/11/2016 2051   KETONESUR 5 (A) 12/11/2016 2051   PROTEINUR 100 (A) 12/11/2016 2051   NITRITE NEGATIVE 12/11/2016 2051   LEUKOCYTESUR NEGATIVE 12/11/2016 2051    Radiological Exams on Admission: Dg Chest 2 View  Result Date: 01/07/2017 CLINICAL DATA:  Sepsis, hypotensive EXAM: CHEST  2 VIEW COMPARISON:  12/11/2016, CT 11/27/2016 FINDINGS: Mild cardiomegaly with aortic atherosclerosis. Tiny pleural effusions. Vague opacities are suspected in the right perihilar region and left lung base. No pneumothorax. IMPRESSION: 1. Mild cardiomegaly with tiny pleural effusion 2. Vague right upper lobe/perihilar and left lung base pulmonary opacity, could reflect small foci of infection or pulmonary nodules. Electronically Signed   By: Donavan Foil M.D.   On: 01/05/2017 16:25    EKG: Independently reviewed. Not done.   Assessment/Plan Active Problems:   Hypotension   Hypotension, near syncope, weakness, and possible syncope episode yesterday  Differential include severe anemia vs orthostatic hypotension vs arrhthymias vs dehydration.  Elevated lactic acid, trend lactic acid.  No signs of infection on CXR, UA is pending.  Hydrate,with NS fluid boluses and maintenance fluids to keep MAP >65.  Transfuse prbc- 2 units ordered by EDP.  Get MRI brain to evaluate for stroke.  Check orthostatics in am.  Monitor on telemetry.  Repeat H&H in am post transfusion.  EDP reports his stool is brown and guiac negative.     Acute anemia of blood loss vs anemia from malignancy and chemotherapy.  So far no signs of bleeding.  Transfuse to keep hemoglobin greater than 7.  PPI /    Hyperlipidemia:  Resume home meds.    Aki:  Suspect from dehydration/ pre renal in etiology.  Hydrate and repeat renal parameters in am.     Thrombocytopenia:  Possibly from malignancy and chemo.  Monitor.    H/o CVA: Ordered MRI for further evaluation.      Atrial fibrillation:  Chronic, rate control with amiodarone for now.  Holding Cardizem and bb for hypotension.  No anticoagulation sec  to severe anemia.   right upper extremity swelling, possibly from the [place] Mantle cell lymphoma on the left side of the neck, involving the lymph nodes.   Further management as per Dr Jana Hakim.  Will add Dr Jana Hakim to the treatment team.    DVT prophylaxis: scd's Code Status: full code.  Family Communication: none at bedside.  Disposition Plan: pending eval by MRI brain and PTe valuation.  Consults called: none.  Admission status: obs/tele   Hosie Poisson MD [facility] Pager [phone]  If 7PM-7AM, please contact night-coverage [email or web address] Password [facility]  12/21/2016, 6:26 PM

## 2017-01-01 NOTE — Telephone Encounter (Signed)
Per MD review of CBC - order given to proceed with treatment with heme of 6.6 - arrange for blood transfusion ASAP.  Orders entered for TXX and transfusion.  Urgent Inbox message sent to schedule.

## 2017-01-01 NOTE — ED Notes (Addendum)
Patient returned from MRI.

## 2017-01-01 NOTE — Telephone Encounter (Signed)
Oral Oncology Patient Advocate Encounter  Received notification from [place] that prior authorization for Imbruvica is required.  PA submitted on CoverMyMeds Key [id number] Status is pending  Oral Oncology Clinic will continue to follow.  Fabio Asa. Melynda Keller, [place] Patient [place] [phone] 01/09/2017 2:53 PM

## 2017-01-01 NOTE — ED Notes (Signed)
Resident at [facility] (paperwork in patient chart.) Patient has lymphoma ad was scheduled to receive chemotherapy today. Patient given Ativan/Benadryl/ and Decadron prior to arrival to ED. Patient did not receive chemotherapy due to hypotension. BP 60/40 prior to leaving cancer center. Report received from May, RN from [facility].

## 2017-01-01 NOTE — Telephone Encounter (Signed)
Called and notified [facility] regarding pt being transferred in ED for further evaluation. Spoke with Elmyra Ricks Pharmacist, hospital at [facility]). Pt belongings (WC/Jacket/bag) in ED with pt.

## 2017-01-01 NOTE — Progress Notes (Signed)
1445- Pt was hypotensive in the 60's/40's 30 minutes after pre meds given prior to start of Rituxan. He was pre medicated with IV benadryl 25mg , lorazepam 0.5mg  IV, decadron IV and tylenol. Pt was seen by Dr.Magrinat prior to releasing treatment today and was okay to proceed with Hg 6.8, and T/C was collected today. Started pt on IVF's and called MD.  Notified Dr.Magrinat and would like to hold Rituxan today and take pt to ED for further evaluation. Attempted to call [facility] x3 with no success. Was able to call nursing home and verify medications when pt first arrived in infusion.   Pt reported that he had fallen on his R side yesterday (+3 lymphedema) at the nursing home. Dr.Magrinat was made aware of this today, per collab RN. Pt noted to have significant amount of diaphoresis upon arrival to infusion (MD aware). Pt received motrin for pain at the nursing home prior to coming in the cancer center today.   Pt was safely taken to ED via stretcher and report given to charge RN.

## 2017-01-01 NOTE — ED Provider Notes (Signed)
[facility] DEPT Provider Note   CSN: [id number] Arrival date & time: 12/21/2016  1500     History   Chief Complaint Chief Complaint  Patient presents with  . Hypotension    HPI [name] [name] is a 66 y.o. male.  66 yo M with a chief complaint of hypertension.  The patient has felt unwell today and felt like he was in a pass out.  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  He has had issues with this in the past.  Patient had not yet started getting his transfusion and had an initial blood pressure that was in the 60s.  An IV was started and then he was sent to the emergency department.  Patient denies cough or congestion denies abdominal pain vomiting or diarrhea.  Denies fevers or chills.  He has felt a little nauseated.  He denies other symptoms.  Generally a poor historian.   The history is provided by the patient.  Illness  This is a new problem. The current episode started 3 to 5 hours ago. The problem occurs constantly. The problem has not changed since onset.Associated symptoms include shortness of breath (chronic and unchanged). Pertinent negatives include no chest pain, no abdominal pain and no headaches. Nothing aggravates the symptoms. Nothing relieves the symptoms. He has tried nothing for the symptoms. The treatment provided no relief.    Past Medical History:  Diagnosis Date  . Bladder tumor   . Cataract   . CVA (cerebral infarction)    2008  . Hyperlipidemia   . Hypertension     Patient Active Problem List   Diagnosis Date Noted  . Hypotension 12/16/2016  . AKI (acute kidney injury) ([place]) 01/09/2017  . Sepsis ([place]) 12/11/2016  . Goals of care, counseling/discussion 12/04/2016  . Mantle cell lymphoma ([place]) 12/02/2016  . Atrial fibrillation with RVR ([place]) 11/27/2016  . Normocytic anemia 11/27/2016  . Lymphadenopathy 11/27/2016  . Hyperlipidemia 10/24/2013  . 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 Constipation  08/05/2012  . Hyperglycemia 08/05/2012  . 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 HEMORRHAGE OF RECTUM AND ANUS 05/17/2008  . ABDOMINAL PAIN, LEFT LOWER QUADRANT 05/17/2008  . DYSLIPIDEMIA 04/07/2008  . 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 Essential hypertension 04/07/2008  . DIVERTICULOSIS, MILD 04/07/2008    Past Surgical History:  Procedure Laterality Date  . CHOLECYSTECTOMY    . EYE SURGERY    . FLEXIBLE SIGMOIDOSCOPY N/A 11/30/2013   Procedure: FLEXIBLE SIGMOIDOSCOPY;  Surgeon: Inda Castle, MD;  Location: [facility];  Service: Endoscopy;  Laterality: N/A;  . HOT HEMOSTASIS N/A 11/30/2013   Procedure: HOT HEMOSTASIS (ARGON PLASMA COAGULATION/BICAP);  Surgeon: Inda Castle, MD;  Location: [facility];  Service: Endoscopy;  Laterality: N/A;  . MOUTH SURGERY         Home Medications    Prior to Admission medications   Medication Sig Start Date End Date Taking? Authorizing Provider  acetaminophen (TYLENOL) 500 MG tablet Take 500 mg by mouth every 6 (six) hours as needed.   Yes [provider]  acetaminophen (TYLENOL) 650 MG CR tablet Take 650 mg every 8 (eight) hours as needed by mouth (for pain or fever ("for 48 hours")).   Yes [provider]  allopurinol (ZYLOPRIM) 300 MG tablet Take 1 tablet (300 mg total) by mouth daily. 12/07/16  Yes Samuella Cota, MD  amiodarone (PACERONE) 200 MG tablet Take 2 tablets (400 mg total) by mouth 2 (two) times daily for  4 days, THEN 1 tablet (200 mg total) daily. Patient taking differently: Take 1 Tablet (200 mg) by mouth daily 12/06/16 01/09/17 Yes Samuella Cota, MD  aspirin 81 MG chewable tablet Chew 1 tablet (81 mg total) by mouth daily. 12/07/16  Yes Samuella Cota, MD  Cholecalciferol (VITAMIN D-3) 1000 units CAPS Take 1,000 Units by mouth daily.    Yes [provider]  diltiazem (CARDIZEM CD) 240 MG 24 hr capsule Take 1 capsule (240 mg total) by mouth daily. 12/07/16  Yes Samuella Cota, MD  docusate sodium (COLACE) 100 MG capsule Take 100 mg by mouth 2 (two) times daily. For constipation   Yes [provider]  ferrous sulfate 325 (65 FE) MG tablet Take 325 mg by mouth daily with breakfast.    Yes [provider]  folic acid (FOLVITE) 1 MG tablet Take 1 tablet (1 mg total) by mouth daily. 12/15/16  Yes Florencia Reasons, MD  ibuprofen (ADVIL,MOTRIN) 400 MG tablet Take 400 mg by mouth every 6 (six) hours as needed for moderate pain.  10/09/16  Yes [provider]  lisinopril (ZESTRIL) 2.5 MG tablet Take 1 tablet (2.5 mg total) by mouth daily. 12/14/16 12/14/17 Yes Florencia Reasons, MD  loratadine (CLARITIN) 10 MG tablet Take 10 mg daily by mouth.   Yes [provider]  metoprolol tartrate (LOPRESSOR) 50 MG tablet Take 1 tablet (50 mg total) by mouth 2 (two) times daily. 12/06/16  Yes Samuella Cota, MD  mupirocin ointment (BACTROBAN) 2 % Place 1 application into the nose 2 (two) times daily. 12/14/16  Yes Florencia Reasons, MD  omeprazole (PRILOSEC) 20 MG capsule Take 1 capsule by mouth daily. 09/17/13  Yes [provider]  simvastatin (ZOCOR) 40 MG tablet Take 40 mg by mouth at bedtime.   Yes [provider]  Chlorhexidine Gluconate Cloth 2 % PADS Apply 6 each topically daily at 6 (six) AM. Patient not taking: Reported on 01/03/2017 12/15/16   Florencia Reasons, MD  Ibrutinib 420 MG TABS Take 420 mg by mouth 1 day or 1 dose for 1 dose. Patient not taking: Reported on 12/19/2016 12/14/2016 01/02/17  Magrinat, [name], MD    Family History Family History  Problem Relation Age of Onset  . Colon cancer Father   . Colon cancer Sister 48  . Hypertension Sister     Social History Social History   Tobacco Use  . Smoking status: Former Smoker    Last attempt to quit: 09/15/1985    Years since quitting: 31.3  . Smokeless tobacco: Never Used  . Tobacco comment: Started at age 35, quit in the 1980's  Substance Use Topics  . Alcohol use: No  . Drug use: No      Allergies   Patient has no known allergies.   Review of Systems Review of Systems  Constitutional: Negative for chills and fever.  HENT: Negative for congestion and facial swelling.   Eyes: Negative for discharge and visual disturbance.  Respiratory: Positive for shortness of breath (chronic and unchanged).   Cardiovascular: Negative for chest pain and palpitations.  Gastrointestinal: Positive for nausea. Negative for abdominal pain, diarrhea and vomiting.  Musculoskeletal: Negative for arthralgias and myalgias.  Skin: Negative for color change and rash.  Neurological: Negative for tremors, syncope and headaches.  Psychiatric/Behavioral: Negative for confusion and dysphoric mood.     Physical Exam Updated Vital Signs BP 100/67 (BP Location: Right Arm)   Pulse 82   Resp 18  SpO2 93%   Physical Exam  Constitutional: He is oriented to person, place, and time. He appears well-developed and well-nourished.  HENT:  Head: Normocephalic and atraumatic.  Eyes: EOM are normal. Pupils are equal, round, and reactive to light.  Neck: Normal range of motion. Neck supple. No JVD present.  Cardiovascular: Normal rate and regular rhythm. Exam reveals no gallop and no friction rub.  No murmur heard. Pulmonary/Chest: No respiratory distress. He has no wheezes.  Abdominal: He exhibits no distension and no mass. There is no tenderness. There is no rebound and no guarding.  Genitourinary: Rectal exam shows no external hemorrhoid, no internal hemorrhoid, no fissure, no mass and anal tone normal.  Genitourinary Comments: Soft brown stool  Musculoskeletal: Normal range of motion. He exhibits edema (right arm 4+ up to the shoulder).  Neurological: He is alert and oriented to person, place, and time.  Skin: No rash noted. No pallor.  Psychiatric: He has a normal mood and affect. His behavior is normal.  Nursing note and vitals reviewed.    ED Treatments / Results  Labs (all labs ordered  are listed, but only abnormal results are displayed) Labs Reviewed  COMPREHENSIVE METABOLIC PANEL - Abnormal; Notable for the following components:      Result Value   Glucose, Bld 103 (*)    BUN 24 (*)    Creatinine, Ser 1.29 (*)    Calcium 8.1 (*)    Total Protein 5.2 (*)    Albumin 2.1 (*)    ALT 15 (*)    Total Bilirubin 1.6 (*)    GFR calc non Af Amer 56 (*)    All other components within normal limits  CBC WITH DIFFERENTIAL/PLATELET - Abnormal; Notable for the following components:   RBC 2.05 (*)    Hemoglobin 6.2 (*)    HCT 20.1 (*)    RDW 21.7 (*)    Platelets 75 (*)    nRBC 3 (*)    All other components within normal limits  I-STAT CG4 LACTIC ACID, ED - Abnormal; Notable for the following components:   Lactic Acid, Venous 2.58 (*)    All other components within normal limits  I-STAT CHEM 8, ED - Abnormal; Notable for the following components:   Glucose, Bld 100 (*)    Calcium, Ion 1.12 (*)    Hemoglobin 6.5 (*)    HCT 19.0 (*)    All other components within normal limits  CULTURE, BLOOD (ROUTINE X 2)  CULTURE, BLOOD (ROUTINE X 2)  URINE CULTURE  URINE CULTURE  URINALYSIS, ROUTINE W REFLEX MICROSCOPIC  HEMOGLOBIN AND HEMATOCRIT, BLOOD  COMPREHENSIVE METABOLIC PANEL  URINALYSIS, ROUTINE W REFLEX MICROSCOPIC  I-STAT CG4 LACTIC ACID, ED  PREPARE RBC (CROSSMATCH)    EKG  EKG Interpretation None       Radiology Dg Chest 2 View  Result Date: 01/12/2017 CLINICAL DATA:  Sepsis, hypotensive EXAM: CHEST  2 VIEW COMPARISON:  12/11/2016, CT 11/27/2016 FINDINGS: Mild cardiomegaly with aortic atherosclerosis. Tiny pleural effusions. Vague opacities are suspected in the right perihilar region and left lung base. No pneumothorax. IMPRESSION: 1. Mild cardiomegaly with tiny pleural effusion 2. Vague right upper lobe/perihilar and left lung base pulmonary opacity, could reflect small foci of infection or pulmonary nodules. Electronically Signed   By: Donavan Foil M.D.    On: 01/11/2017 16:25   Mr Brain Wo Contrast  Result Date: 12/20/2016 CLINICAL DATA:  Initial evaluation for acute altered mental status. History of min coma. EXAM: MRI HEAD  WITHOUT CONTRAST TECHNIQUE: Multiplanar, multiecho pulse sequences of the brain and surrounding structures were obtained without intravenous contrast. COMPARISON:  None. FINDINGS: Brain: Study limited as the patient was unable to tolerate the full length of the exam. Additionally, images provided are markedly degraded by motion artifact. On coronal DWI sequence, there is a 5 mm focus of diffusion abnormality within the left cerebellar hemisphere, suspicious for possible small acute ischemic infarct (series 5, image 9). This is not well seen on axial sequence. No secondary findings to suggest associated hemorrhage or mass effect. No other convincing foci of restricted diffusion to suggest acute or subacute ischemia. Minimal patchy diffusion abnormality overlying the surface/cortical gray matter of the right parieto-occipital region favored to be artifactual in nature (series 3, image 27). No other acute intracranial process. Agenesis of the corpus callosum again noted. Encephalomalacia out within the right parietal region related to prior hemorrhage. No mass lesion or midline shift. No significant mass effect. No extra-axial fluid collection. Vascular: Major intracranial vascular flow voids are maintained at the skullbase. Skull and upper cervical spine: Craniocervical junction within normal limits. Upper cervical spine normal. Bone marrow signal intensity grossly within normal limits. No scalp soft tissue abnormality. Sinuses/Orbits: The globes and orbital soft tissues within normal limits. Scattered mucosal thickening throughout the paranasal sinuses. Air-fluid levels within the maxillary and right sphenoid sinus. Trace left mastoid effusion. Other: None. IMPRESSION: 1. Limited exam as the patient was unable to tolerate the full length of  the study. Additionally, images provided are degraded by motion. 2. Punctate 5 mm focus of diffusion abnormality within the left cerebellar hemisphere, suspicious for small acute ischemic infarct. 3. No other definite acute intracranial abnormality. 4. 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 5. Acute paranasal sinusitis as above. Electronically Signed   By: Jeannine Boga M.D.   On: 01/12/2017 20:57    Procedures Procedures (including critical care time)  Medications Ordered in ED Medications  0.9 %  sodium chloride infusion (not administered)  0.9 %  sodium chloride infusion (not administered)  sodium chloride 0.9 % bolus 1,000 mL (1,000 mLs Intravenous New Bag/Given 12/16/2016 1832)  pantoprazole (PROTONIX) EC tablet 40 mg (not administered)  sodium chloride 0.9 % bolus 1,000 mL (0 mLs Intravenous Stopped 12/18/2016 1726)     Initial Impression / Assessment and Plan / ED Course  I have reviewed the triage vital signs and the nursing notes.  Pertinent labs & imaging results that were available during my care of the patient were reviewed by me and considered in my medical decision making (see chart for details).     66 yo M with a chief complaint of hypertension.  This was noticed today at his oncology appointment to get an infusion.  This improved with IV fluids.  The patient was noted to have a low hemoglobin.  Rectal exam with no dark stool or blood.  Order a transfusion.  Discussed with the hospitalist for admission.  CRITICAL CARE Performed by: Cecilio Asper   Total critical care time: 80 minutes  Critical care time was exclusive of separately billable procedures and treating other patients.  Critical care was necessary to treat or prevent imminent or life-threatening deterioration.  Critical care was time spent personally by me on the following activities: development of treatment plan with patient and/or surrogate as well as nursing,  discussions with consultants, evaluation of patient's response to treatment, examination of patient, obtaining history from patient or surrogate, ordering and performing treatments and interventions,  ordering and review of laboratory studies, ordering and review of radiographic studies, pulse oximetry and re-evaluation of patient's condition.  The patients results and plan were reviewed and discussed.   Any x-rays performed were independently reviewed by myself.   Differential diagnosis were considered with the presenting HPI.  Medications  0.9 %  sodium chloride infusion (not administered)  0.9 %  sodium chloride infusion (not administered)  sodium chloride 0.9 % bolus 1,000 mL (1,000 mLs Intravenous New Bag/Given 12/20/2016 1832)  pantoprazole (PROTONIX) EC tablet 40 mg (not administered)  sodium chloride 0.9 % bolus 1,000 mL (0 mLs Intravenous Stopped 12/25/2016 1726)    Vitals:   12/25/2016 1800 12/16/2016 1915 01/09/2017 1930 12/14/2016 2036  BP: (!) 87/72 (!) 100/59 91/61 100/67  Pulse: 76 88 (!) 104 82  Resp: 13 17 (!) 21 18  SpO2: 97% 97% 97% 93%    Final diagnoses:  Symptomatic anemia    Admission/ observation were discussed with the admitting physician, patient and/or family and they are comfortable with the plan.    Final Clinical Impressions(s) / ED Diagnoses   Final diagnoses:  Symptomatic anemia    ED Discharge Orders    None       Deno Etienne, DO 01/02/2017 2103

## 2017-01-01 NOTE — ED Notes (Signed)
Attempted 2nd IV. Unable to gain access. Nothing visualized on [place].

## 2017-01-01 NOTE — ED Triage Notes (Signed)
Per cancer center RN-states he was to get second radiation treatment-presented hypotensive and diaphoretic-patient is from [facility] and they have been notified-it appears the facility gave his BP meds this am

## 2017-01-01 NOTE — ED Notes (Signed)
Patient transported to X-ray 

## 2017-01-01 NOTE — ED Notes (Signed)
Provider at bedside. Dr. Karleen Hampshire.

## 2017-01-01 NOTE — ED Notes (Signed)
Condom catheter placed

## 2017-01-01 NOTE — ED Notes (Signed)
Patient transported to MRI 

## 2017-01-02 ENCOUNTER — Encounter (HOSPITAL_COMMUNITY): Payer: Self-pay | Admitting: *Deleted

## 2017-01-02 ENCOUNTER — Encounter: Payer: Self-pay | Admitting: General Practice

## 2017-01-02 ENCOUNTER — Inpatient Hospital Stay (HOSPITAL_COMMUNITY): Payer: Medicare Other

## 2017-01-02 ENCOUNTER — Other Ambulatory Visit: Payer: Self-pay

## 2017-01-02 DIAGNOSIS — D696 Thrombocytopenia, unspecified: Secondary | ICD-10-CM | POA: Diagnosis not present

## 2017-01-02 DIAGNOSIS — Z8673 Personal history of transient ischemic attack (TIA), and cerebral infarction without residual deficits: Secondary | ICD-10-CM | POA: Diagnosis not present

## 2017-01-02 DIAGNOSIS — D6481 Anemia due to antineoplastic chemotherapy: Secondary | ICD-10-CM | POA: Diagnosis present

## 2017-01-02 DIAGNOSIS — I5032 Chronic diastolic (congestive) heart failure: Secondary | ICD-10-CM | POA: Diagnosis not present

## 2017-01-02 DIAGNOSIS — E785 Hyperlipidemia, unspecified: Secondary | ICD-10-CM | POA: Diagnosis present

## 2017-01-02 DIAGNOSIS — C8580 Other specified types of non-Hodgkin lymphoma, unspecified site: Secondary | ICD-10-CM | POA: Diagnosis not present

## 2017-01-02 DIAGNOSIS — I639 Cerebral infarction, unspecified: Secondary | ICD-10-CM | POA: Diagnosis not present

## 2017-01-02 DIAGNOSIS — R0603 Acute respiratory distress: Secondary | ICD-10-CM | POA: Diagnosis not present

## 2017-01-02 DIAGNOSIS — I4891 Unspecified atrial fibrillation: Secondary | ICD-10-CM

## 2017-01-02 DIAGNOSIS — Y95 Nosocomial condition: Secondary | ICD-10-CM | POA: Diagnosis not present

## 2017-01-02 DIAGNOSIS — J189 Pneumonia, unspecified organism: Secondary | ICD-10-CM | POA: Diagnosis not present

## 2017-01-02 DIAGNOSIS — E860 Dehydration: Secondary | ICD-10-CM | POA: Diagnosis present

## 2017-01-02 DIAGNOSIS — J9601 Acute respiratory failure with hypoxia: Secondary | ICD-10-CM | POA: Diagnosis not present

## 2017-01-02 DIAGNOSIS — C8311 Mantle cell lymphoma, lymph nodes of head, face, and neck: Secondary | ICD-10-CM | POA: Diagnosis not present

## 2017-01-02 DIAGNOSIS — D638 Anemia in other chronic diseases classified elsewhere: Secondary | ICD-10-CM | POA: Diagnosis not present

## 2017-01-02 DIAGNOSIS — I63512 Cerebral infarction due to unspecified occlusion or stenosis of left middle cerebral artery: Secondary | ICD-10-CM | POA: Diagnosis not present

## 2017-01-02 DIAGNOSIS — Z800 Family history of malignant neoplasm of digestive organs: Secondary | ICD-10-CM | POA: Diagnosis not present

## 2017-01-02 DIAGNOSIS — R531 Weakness: Secondary | ICD-10-CM | POA: Diagnosis not present

## 2017-01-02 DIAGNOSIS — I959 Hypotension, unspecified: Secondary | ICD-10-CM | POA: Diagnosis not present

## 2017-01-02 DIAGNOSIS — I951 Orthostatic hypotension: Secondary | ICD-10-CM | POA: Diagnosis present

## 2017-01-02 DIAGNOSIS — I9589 Other hypotension: Secondary | ICD-10-CM | POA: Diagnosis not present

## 2017-01-02 DIAGNOSIS — I63112 Cerebral infarction due to embolism of left vertebral artery: Secondary | ICD-10-CM

## 2017-01-02 DIAGNOSIS — D649 Anemia, unspecified: Secondary | ICD-10-CM | POA: Diagnosis not present

## 2017-01-02 DIAGNOSIS — Z87891 Personal history of nicotine dependence: Secondary | ICD-10-CM | POA: Diagnosis not present

## 2017-01-02 DIAGNOSIS — I10 Essential (primary) hypertension: Secondary | ICD-10-CM | POA: Diagnosis not present

## 2017-01-02 DIAGNOSIS — N170 Acute kidney failure with tubular necrosis: Secondary | ICD-10-CM | POA: Diagnosis present

## 2017-01-02 DIAGNOSIS — E861 Hypovolemia: Secondary | ICD-10-CM | POA: Diagnosis not present

## 2017-01-02 DIAGNOSIS — Q040 Congenital malformations of corpus callosum: Secondary | ICD-10-CM | POA: Diagnosis not present

## 2017-01-02 DIAGNOSIS — M7989 Other specified soft tissue disorders: Secondary | ICD-10-CM | POA: Diagnosis not present

## 2017-01-02 DIAGNOSIS — R7989 Other specified abnormal findings of blood chemistry: Secondary | ICD-10-CM | POA: Diagnosis not present

## 2017-01-02 DIAGNOSIS — F411 Generalized anxiety disorder: Secondary | ICD-10-CM | POA: Diagnosis not present

## 2017-01-02 DIAGNOSIS — N179 Acute kidney failure, unspecified: Secondary | ICD-10-CM | POA: Diagnosis not present

## 2017-01-02 DIAGNOSIS — R161 Splenomegaly, not elsewhere classified: Secondary | ICD-10-CM | POA: Diagnosis not present

## 2017-01-02 DIAGNOSIS — I63442 Cerebral infarction due to embolism of left cerebellar artery: Secondary | ICD-10-CM | POA: Diagnosis not present

## 2017-01-02 DIAGNOSIS — Z515 Encounter for palliative care: Secondary | ICD-10-CM | POA: Diagnosis not present

## 2017-01-02 DIAGNOSIS — F0150 Vascular dementia without behavioral disturbance: Secondary | ICD-10-CM | POA: Diagnosis not present

## 2017-01-02 DIAGNOSIS — I63233 Cerebral infarction due to unspecified occlusion or stenosis of bilateral carotid arteries: Secondary | ICD-10-CM | POA: Diagnosis not present

## 2017-01-02 DIAGNOSIS — R4182 Altered mental status, unspecified: Secondary | ICD-10-CM | POA: Diagnosis not present

## 2017-01-02 DIAGNOSIS — G9389 Other specified disorders of brain: Secondary | ICD-10-CM | POA: Diagnosis present

## 2017-01-02 DIAGNOSIS — T451X5A Adverse effect of antineoplastic and immunosuppressive drugs, initial encounter: Secondary | ICD-10-CM | POA: Diagnosis present

## 2017-01-02 DIAGNOSIS — J80 Acute respiratory distress syndrome: Secondary | ICD-10-CM | POA: Diagnosis not present

## 2017-01-02 DIAGNOSIS — C8310 Mantle cell lymphoma, unspecified site: Secondary | ICD-10-CM | POA: Diagnosis not present

## 2017-01-02 DIAGNOSIS — I517 Cardiomegaly: Secondary | ICD-10-CM | POA: Diagnosis not present

## 2017-01-02 DIAGNOSIS — R591 Generalized enlarged lymph nodes: Secondary | ICD-10-CM | POA: Diagnosis not present

## 2017-01-02 DIAGNOSIS — I482 Chronic atrial fibrillation: Secondary | ICD-10-CM | POA: Diagnosis not present

## 2017-01-02 DIAGNOSIS — D630 Anemia in neoplastic disease: Secondary | ICD-10-CM | POA: Diagnosis present

## 2017-01-02 DIAGNOSIS — Z66 Do not resuscitate: Secondary | ICD-10-CM | POA: Diagnosis not present

## 2017-01-02 DIAGNOSIS — I361 Nonrheumatic tricuspid (valve) insufficiency: Secondary | ICD-10-CM | POA: Diagnosis not present

## 2017-01-02 DIAGNOSIS — I272 Pulmonary hypertension, unspecified: Secondary | ICD-10-CM | POA: Diagnosis not present

## 2017-01-02 DIAGNOSIS — R0902 Hypoxemia: Secondary | ICD-10-CM | POA: Diagnosis not present

## 2017-01-02 DIAGNOSIS — E872 Acidosis: Secondary | ICD-10-CM | POA: Diagnosis not present

## 2017-01-02 DIAGNOSIS — D6959 Other secondary thrombocytopenia: Secondary | ICD-10-CM | POA: Diagnosis present

## 2017-01-02 DIAGNOSIS — E7849 Other hyperlipidemia: Secondary | ICD-10-CM | POA: Diagnosis not present

## 2017-01-02 DIAGNOSIS — J810 Acute pulmonary edema: Secondary | ICD-10-CM | POA: Diagnosis not present

## 2017-01-02 LAB — COMPREHENSIVE METABOLIC PANEL
ALT: 16 U/L — AB (ref 17–63)
AST: 32 U/L (ref 15–41)
Albumin: 2 g/dL — ABNORMAL LOW (ref 3.5–5.0)
Alkaline Phosphatase: 82 U/L (ref 38–126)
Anion gap: 10 (ref 5–15)
BUN: 26 mg/dL — ABNORMAL HIGH (ref 6–20)
CHLORIDE: 109 mmol/L (ref 101–111)
CO2: 19 mmol/L — AB (ref 22–32)
CREATININE: 0.91 mg/dL (ref 0.61–1.24)
Calcium: 7.4 mg/dL — ABNORMAL LOW (ref 8.9–10.3)
Glucose, Bld: 146 mg/dL — ABNORMAL HIGH (ref 65–99)
POTASSIUM: 4.2 mmol/L (ref 3.5–5.1)
SODIUM: 138 mmol/L (ref 135–145)
Total Bilirubin: 2.2 mg/dL — ABNORMAL HIGH (ref 0.3–1.2)
Total Protein: 4.8 g/dL — ABNORMAL LOW (ref 6.5–8.1)

## 2017-01-02 LAB — BLOOD CULTURE ID PANEL (REFLEXED)
ACINETOBACTER BAUMANNII: NOT DETECTED
CANDIDA ALBICANS: NOT DETECTED
CANDIDA PARAPSILOSIS: NOT DETECTED
Candida glabrata: NOT DETECTED
Candida krusei: NOT DETECTED
Candida tropicalis: NOT DETECTED
ENTEROBACTERIACEAE SPECIES: NOT DETECTED
ENTEROCOCCUS SPECIES: NOT DETECTED
Enterobacter cloacae complex: NOT DETECTED
Escherichia coli: NOT DETECTED
HAEMOPHILUS INFLUENZAE: NOT DETECTED
KLEBSIELLA OXYTOCA: NOT DETECTED
Klebsiella pneumoniae: NOT DETECTED
LISTERIA MONOCYTOGENES: NOT DETECTED
METHICILLIN RESISTANCE: DETECTED — AB
Neisseria meningitidis: NOT DETECTED
PSEUDOMONAS AERUGINOSA: NOT DETECTED
Proteus species: NOT DETECTED
SERRATIA MARCESCENS: NOT DETECTED
STAPHYLOCOCCUS AUREUS BCID: NOT DETECTED
STREPTOCOCCUS AGALACTIAE: NOT DETECTED
STREPTOCOCCUS PNEUMONIAE: NOT DETECTED
STREPTOCOCCUS PYOGENES: NOT DETECTED
STREPTOCOCCUS SPECIES: NOT DETECTED
Staphylococcus species: DETECTED — AB

## 2017-01-02 LAB — URINALYSIS, ROUTINE W REFLEX MICROSCOPIC
Bacteria, UA: NONE SEEN
Bilirubin Urine: NEGATIVE
GLUCOSE, UA: NEGATIVE mg/dL
HGB URINE DIPSTICK: NEGATIVE
KETONES UR: 5 mg/dL — AB
LEUKOCYTES UA: NEGATIVE
NITRITE: NEGATIVE
PH: 5 (ref 5.0–8.0)
PROTEIN: 100 mg/dL — AB
Specific Gravity, Urine: 1.021 (ref 1.005–1.030)
Squamous Epithelial / LPF: NONE SEEN

## 2017-01-02 LAB — HEMOGLOBIN AND HEMATOCRIT, BLOOD
HCT: 25.4 % — ABNORMAL LOW (ref 39.0–52.0)
HEMOGLOBIN: 8.2 g/dL — AB (ref 13.0–17.0)

## 2017-01-02 LAB — PATHOLOGIST SMEAR REVIEW

## 2017-01-02 LAB — OCCULT BLOOD, POC DEVICE: Fecal Occult Bld: NEGATIVE

## 2017-01-02 MED ORDER — IOPAMIDOL (ISOVUE-370) INJECTION 76%
INTRAVENOUS | Status: AC
  Administered 2017-01-02: 50 mL
  Filled 2017-01-02: qty 50

## 2017-01-02 NOTE — Progress Notes (Signed)
PHARMACY - PHYSICIAN COMMUNICATION CRITICAL VALUE ALERT - BLOOD CULTURE IDENTIFICATION (BCID)  [name] [name] is an 66 y.o. male who presented to [facility] on 12/26/2016 with a chief complaint of weakness  Assessment:  Patient transferred from ED at [facility] to 3W at [facility] for stroke workup. 1/2 blood cultures positive for GPC- BCID showing MRSE Urine cultures also sent, still pending  Name of physician (or Provider) Contacted: Dr. Karleen Hampshire (sent text page)  Current antibiotics: none  Changes to prescribed antibiotics recommended:  no need for antibiotics at this time given likelihood of contamination on blood culture  Results for orders placed or performed during the hospital encounter of 12/30/2016  Blood Culture ID Panel (Reflexed) (Collected: 12/29/2016  3:26 PM)  Result Value Ref Range   Enterococcus species NOT DETECTED NOT DETECTED   Listeria monocytogenes NOT DETECTED NOT DETECTED   Staphylococcus species DETECTED (A) NOT DETECTED   Staphylococcus aureus NOT DETECTED NOT DETECTED   Methicillin resistance DETECTED (A) NOT DETECTED   Streptococcus species NOT DETECTED NOT DETECTED   Streptococcus agalactiae NOT DETECTED NOT DETECTED   Streptococcus pneumoniae NOT DETECTED NOT DETECTED   Streptococcus pyogenes NOT DETECTED NOT DETECTED   Acinetobacter baumannii NOT DETECTED NOT DETECTED   Enterobacteriaceae species NOT DETECTED NOT DETECTED   Enterobacter cloacae complex NOT DETECTED NOT DETECTED   Escherichia coli NOT DETECTED NOT DETECTED   Klebsiella oxytoca NOT DETECTED NOT DETECTED   Klebsiella pneumoniae NOT DETECTED NOT DETECTED   Proteus species NOT DETECTED NOT DETECTED   Serratia marcescens NOT DETECTED NOT DETECTED   Haemophilus influenzae NOT DETECTED NOT DETECTED   Neisseria meningitidis NOT DETECTED NOT DETECTED   Pseudomonas aeruginosa NOT DETECTED NOT DETECTED   Candida albicans NOT DETECTED NOT DETECTED   Candida glabrata NOT DETECTED NOT DETECTED   Candida krusei  NOT DETECTED NOT DETECTED   Candida parapsilosis NOT DETECTED NOT DETECTED   Candida tropicalis NOT DETECTED NOT DETECTED   [name] [name] D. Connie Hilgert, PharmD, BCPS Clinical Pharmacist Clinical Phone for 01/02/2017 until 3:30pm: [phone] If after 3:30pm, please call main pharmacy at [phone] 01/02/2017 1:13 PM

## 2017-01-02 NOTE — Progress Notes (Signed)
PT Cancellation Note  Patient Details Name: KILEY TORRENCE MRN: [id number] DOB: 1950/08/03   Cancelled Treatment:    Reason Eval/Treat Not Completed: Medical issues which prohibited therapy, [facility].    Jefferey, [place] 01/02/2017, 1:24 PM Tresa Endo PT [phone]

## 2017-01-02 NOTE — Progress Notes (Addendum)
Report called to Nurse on [place]. Waiting for [facility] transport. Personal belongings and wheelchair sent with patient to [facility].

## 2017-01-02 NOTE — Care Management Note (Signed)
Case Management Note  Patient Details  Name: [name] [name] MRN: [id number] Date of Birth: May 21, 1950  Subjective/Objective:                  Syncope ,poss cva  Action/Plan: Date: January 02, 2017 Velva Harman, BSN, [place], [place] Chart and notes review for patient progress and needs. Will follow for case management and discharge needs. Next review date: [id number]  Expected Discharge Date:                  Expected Discharge Plan:  [place]  In-House Referral:  Clinical Social Work  Discharge planning Services  CM Consult  Post Acute Care Choice:    Choice offered to:     DME Arranged:    DME Agency:     HH Arranged:    [place] Agency:     Status of Service:  In process, will continue to follow  If discussed at Long Length of Stay Meetings, dates discussed:    Additional Comments:  [name], RN 01/02/2017, 8:21 AM

## 2017-01-02 NOTE — Consult Note (Signed)
Referring Physician: Dr. Lysle Rubens    Chief Complaint: Acute subcentimeter left cerebellar ischemic infarction  HPI: [name] [name] is an 66 y.o. 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 fib presented to [facility] long hospital on January 01, 2017 from oncology infusion center for generalized weakness and a near syncopal episode.  He had a fall the day prior and had hit his head.  At the ED, he was found to be hypotensive and tachycardic. Labs showing hemoglobin 6.2, platelets 75,000, creatinine mildly above baseline, and an elevated lactic acid. UA not suggestive of an infection. MRI of brain revealed a punctate 5 mm focus of diffusion abnormality within the left cerebellar hemisphere, suspicious for small acute ischemic infarct. No other definite acute intracranial abnormality. 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 Patient received 2U PRBCs, Hgb 8.2 today. He has been transferred to [facility] for stroke team evaluation.   Patient was restless and agitated. It is was very difficult to obtain a history from him. States he was eating breakfast with his therapist present at his side at [facility]. He lost his balance and fell as he was trying to put a cup of water down on a table. Denies losing consciousness. He remembers the staff gathering around him when this happened. Denies having any speech impairment or visual disturbance at that time. He is not able to tell whether he experienced any focal weakness or numbness. Does report having intermittent headaches and dizziness but does not elaborate further. States he remembers going to the cancer center the following day but is not sure why he was transferred to the hospital.  LSN: Unclear  tPA Given: No: Subcentimeter completed stroke.   Past Medical History:  Diagnosis Date  . Bladder tumor   . Cataract    . CVA (cerebral infarction)    2008  . Hyperlipidemia   . Hypertension     Past Surgical History:  Procedure Laterality Date  . CHOLECYSTECTOMY    . EYE SURGERY    . FLEXIBLE SIGMOIDOSCOPY N/A 11/30/2013   Procedure: FLEXIBLE SIGMOIDOSCOPY;  Surgeon: Inda Castle, MD;  Location: [facility]OSCOPY;  Service: Endoscopy;  Laterality: N/A;  . HOT HEMOSTASIS N/A 11/30/2013   Procedure: HOT HEMOSTASIS (ARGON PLASMA COAGULATION/BICAP);  Surgeon: Inda Castle, MD;  Location: [facility];  Service: Endoscopy;  Laterality: N/A;  . MOUTH SURGERY      Family History  Problem Relation Age of Onset  . Colon cancer Father   . Colon cancer Sister 18  . Hypertension Sister    Social History:  reports that he quit smoking about 31 years ago. he has never used smokeless tobacco. He reports that he does not drink alcohol or use drugs.  Allergies: No Known Allergies  Medications:  Scheduled: . allopurinol  300 mg Oral Daily  . amiodarone  200 mg Oral Daily  . atorvastatin  20 mg Oral q1800  . Chlorhexidine Gluconate Cloth  6 each Topical Q0600  . ferrous sulfate  325 mg Oral Q breakfast  . folic acid  1 mg Oral Daily  . mupirocin ointment  1 application Nasal BID  . pantoprazole  40 mg Oral Daily  . pantoprazole  40 mg Oral Q0600   Continuous: . sodium chloride    . sodium chloride 100 mL/hr at 01/02/17 1544   YBW:[id number], ondansetron **OR** ondansetron (ZOFRAN) IV  ROS: Pertinent  positives mentioned in HPI. Could not obtain any further history from the patient.   Physical Examination: Blood pressure 107/64, pulse (!) 102, temperature 98.2 F (36.8 C), temperature source Oral, resp. rate 20, height 6' (1.829 m), weight 186 lb 1.1 oz (84.4 kg), SpO2 99 %.  Neurologic Examination: Exam limited due to poor patient cooperation.   Mental Status: Patient was restless and agitated. Alert, oriented x 3. Mild to moderate agitation. Perseverative speech with repeated complaints  about his bed and wish to be placed upon a cot or a stack of 3 mattresses on the floor. Speech fluent in this context without errors of grammar or syntax. Able to follow all simple commands. Cranial Nerves: II: Visual fields grossly normal. Tracks visual stimuli normally.   III,IV, VI: Ptosis not present. Prominent skin tag along medial aspect of left eye is noted. Eyes are conjugate. No nystagmus noted. Bilaterally pupils equal, round, reactive to light V,VII: Smile symmetric, facial light touch sensation normal bilaterally VIII: hearing grossly normal bilaterally  IX,X: Unable to visualize uvula XI: Symmetric XII: midline tongue extension Motor: Right :  Upper extremity   5/5                                      Left:     Upper extremity   4/5             Lower extremity   5/5                                                  Lower extremity   4/5 Tone and bulk:normal tone throughout; no atrophy noted Large region of angioedema/swelling to right upper arm is noted; no redness or warmth noted.  Sensory: Difficult to assess due to lack of patient cooperation. No gross sensory deficit noted.  Deep Tendon Reflexes: Unable to assess due to lack of patient cooperation Plantars: Difficult to assess due to lack of patient cooperation Cerebellar: Unable to assess due to lack of patient cooperation Gait: Deferred due to falls risk concerns in the context of agitation    Results for orders placed or performed during the hospital encounter of 01/08/2017 (from the past 48 hour(s))  Comprehensive metabolic panel     Status: Abnormal   Collection Time: 01/09/2017  3:11 PM  Result Value Ref Range   Sodium 141 135 - 145 mmol/L   Potassium 4.2 3.5 - 5.1 mmol/L   Chloride 111 101 - 111 mmol/L   CO2 23 22 - 32 mmol/L   Glucose, Bld 103 (H) 65 - 99 mg/dL   BUN 24 (H) 6 - 20 mg/dL   Creatinine, Ser 1.29 (H) 0.61 - 1.24 mg/dL   Calcium 8.1 (L) 8.9 - 10.3 mg/dL   Total Protein 5.2 (L) 6.5 - 8.1 g/dL   Albumin  2.1 (L) 3.5 - 5.0 g/dL   AST 36 15 - 41 U/L   ALT 15 (L) 17 - 63 U/L   Alkaline Phosphatase 88 38 - 126 U/L   Total Bilirubin 1.6 (H) 0.3 - 1.2 mg/dL   GFR calc non Af Amer 56 (L) >60 mL/min   GFR calc Af Amer >60 >60 mL/min    Comment: (NOTE) The eGFR has been calculated using the CKD EPI  equation. This calculation has not been validated in all clinical situations. eGFR's persistently <60 mL/min signify possible Chronic Kidney Disease.    Anion gap 7 5 - 15  CBC WITH DIFFERENTIAL     Status: Abnormal   Collection Time: 12/13/2016  3:11 PM  Result Value Ref Range   WBC 6.7 4.0 - 10.5 K/uL   RBC 2.05 (L) 4.22 - 5.81 MIL/uL   Hemoglobin 6.2 (LL) 13.0 - 17.0 g/dL    Comment: REPEATED TO VERIFY CRITICAL RESULT CALLED TO, READ BACK BY AND VERIFIED WITH: TALKINGTON,J RN AT 1610 12.20.2018 BY LINEBERRY,R    HCT 20.1 (L) 39.0 - 52.0 %   MCV 98.0 78.0 - 100.0 fL   MCH 30.2 26.0 - 34.0 pg   MCHC 30.8 30.0 - 36.0 g/dL   RDW 21.7 (H) 11.5 - 15.5 %   Platelets 75 (L) 150 - 400 K/uL    Comment: REPEATED TO VERIFY SPECIMEN CHECKED FOR CLOTS PLATELET COUNT CONFIRMED BY SMEAR    Neutrophils Relative % 50 %   Lymphocytes Relative 30 %   Monocytes Relative 9 %   Eosinophils Relative 0 %   Basophils Relative 0 %   Myelocytes 2 %   nRBC 3 (H) 0 /100 WBC   Other 9 %   Neutro Abs 3.5 1.7 - 7.7 K/uL   Lymphs Abs 2.0 0.7 - 4.0 K/uL   Monocytes Absolute 0.6 0.1 - 1.0 K/uL   Eosinophils Absolute 0.0 0.0 - 0.7 K/uL   Basophils Absolute 0.0 0.0 - 0.1 K/uL   RBC Morphology POLYCHROMASIA PRESENT     Comment: TARGET CELLS RARE NRBCs    WBC Morphology MILD LEFT SHIFT (1-5% METAS, OCC MYELO, OCC BANDS)     Comment: ATYPICAL MONONUCLEAR CELLS  Blood Culture (routine x 2)     Status: None (Preliminary result)   Collection Time: 12/26/2016  3:26 PM  Result Value Ref Range   Specimen Description BLOOD LEFT FOREARM    Special Requests      BOTTLES DRAWN AEROBIC AND ANAEROBIC Blood Culture adequate  volume   Culture  Setup Time      GRAM POSITIVE COCCI AEROBIC BOTTLE ONLY CRITICAL RESULT CALLED TO, READ BACK BY AND VERIFIED WITH: N GLOGOVAC,PHARMD AT 1240 01/02/17 BY L BENFIELD Performed at [facility], [address] [place], [place] [address]    Culture GRAM POSITIVE COCCI    Report Status PENDING   Blood Culture ID Panel (Reflexed)     Status: Abnormal   Collection Time: 12/18/2016  3:26 PM  Result Value Ref Range   Enterococcus species NOT DETECTED NOT DETECTED   Listeria monocytogenes NOT DETECTED NOT DETECTED   Staphylococcus species DETECTED (A) NOT DETECTED    Comment: Methicillin (oxacillin) resistant coagulase negative staphylococcus. Possible blood culture contaminant (unless isolated from more than one blood culture draw or clinical case suggests pathogenicity). No antibiotic treatment is indicated for blood  culture contaminants. CRITICAL RESULT CALLED TO, READ BACK BY AND VERIFIED WITH: N GLOGOVAC,PHARMD AT 1240 01/02/17 BY L BENFIELD    Staphylococcus aureus NOT DETECTED NOT DETECTED   Methicillin resistance DETECTED (A) NOT DETECTED    Comment: CRITICAL RESULT CALLED TO, READ BACK BY AND VERIFIED WITH: N GLOGOVAC,PHARMD AT 1240 01/02/17 BY L BENFIELD    Streptococcus species NOT DETECTED NOT DETECTED   Streptococcus agalactiae NOT DETECTED NOT DETECTED   Streptococcus pneumoniae NOT DETECTED NOT DETECTED   Streptococcus pyogenes NOT DETECTED NOT DETECTED   Acinetobacter baumannii NOT  DETECTED NOT DETECTED   Enterobacteriaceae species NOT DETECTED NOT DETECTED   Enterobacter cloacae complex NOT DETECTED NOT DETECTED   Escherichia coli NOT DETECTED NOT DETECTED   Klebsiella oxytoca NOT DETECTED NOT DETECTED   Klebsiella pneumoniae NOT DETECTED NOT DETECTED   Proteus species NOT DETECTED NOT DETECTED   Serratia marcescens NOT DETECTED NOT DETECTED   Haemophilus influenzae NOT DETECTED NOT DETECTED   Neisseria meningitidis NOT DETECTED NOT DETECTED    Pseudomonas aeruginosa NOT DETECTED NOT DETECTED   Candida albicans NOT DETECTED NOT DETECTED   Candida glabrata NOT DETECTED NOT DETECTED   Candida krusei NOT DETECTED NOT DETECTED   Candida parapsilosis NOT DETECTED NOT DETECTED   Candida tropicalis NOT DETECTED NOT DETECTED    Comment: Performed at [facility], [place] [address], [place], [place] [address]  I-Stat CG4 Lactic Acid, ED  (not at  [facility])     Status: Abnormal   Collection Time: 01/11/2017  3:36 PM  Result Value Ref Range   Lactic Acid, Venous 2.58 (HH) 0.5 - 1.9 mmol/L   Comment NOTIFIED PHYSICIAN   I-Stat Chem 8, ED     Status: Abnormal   Collection Time: 12/17/2016  3:36 PM  Result Value Ref Range   Sodium 142 135 - 145 mmol/L   Potassium 4.2 3.5 - 5.1 mmol/L   Chloride 108 101 - 111 mmol/L   BUN 20 6 - 20 mg/dL   Creatinine, Ser 1.20 0.61 - 1.24 mg/dL   Glucose, Bld 100 (H) 65 - 99 mg/dL   Calcium, Ion 1.12 (L) 1.15 - 1.40 mmol/L   TCO2 22 22 - 32 mmol/L   Hemoglobin 6.5 (LL) 13.0 - 17.0 g/dL   HCT 19.0 (L) 39.0 - 52.0 %   Comment NOTIFIED PHYSICIAN   Occult blood, poc device     Status: None   Collection Time: 12/22/2016  4:44 PM  Result Value Ref Range   Fecal Occult Bld NEGATIVE NEGATIVE  Prepare RBC     Status: None   Collection Time: 01/07/2017  5:00 PM  Result Value Ref Range   Order Confirmation ORDER PROCESSED BY BLOOD BANK   I-Stat CG4 Lactic Acid, ED  (not at  [facility])     Status: None   Collection Time: 01/07/2017  6:22 PM  Result Value Ref Range   Lactic Acid, Venous 1.47 0.5 - 1.9 mmol/L  Urinalysis, Routine w reflex microscopic (not at [facility])     Status: Abnormal   Collection Time: 01/02/17  1:17 AM  Result Value Ref Range   Color, Urine AMBER (A) YELLOW    Comment: BIOCHEMICALS MAY BE AFFECTED BY COLOR   APPearance CLEAR CLEAR   Specific Gravity, Urine 1.021 1.005 - 1.030   pH 5.0 5.0 - 8.0   Glucose, UA NEGATIVE NEGATIVE mg/dL   Hgb urine dipstick NEGATIVE NEGATIVE   Bilirubin Urine NEGATIVE  NEGATIVE   Ketones, ur 5 (A) NEGATIVE mg/dL   Protein, ur 100 (A) NEGATIVE mg/dL   Nitrite NEGATIVE NEGATIVE   Leukocytes, UA NEGATIVE NEGATIVE   RBC / HPF 0-5 0 - 5 RBC/hpf   WBC, UA 0-5 0 - 5 WBC/hpf   Bacteria, UA NONE SEEN NONE SEEN   Squamous Epithelial / LPF NONE SEEN NONE SEEN   Mucus PRESENT   Comprehensive metabolic panel     Status: Abnormal   Collection Time: 01/02/17  8:27 AM  Result Value Ref Range   Sodium 138 135 - 145 mmol/L   Potassium 4.2  3.5 - 5.1 mmol/L   Chloride 109 101 - 111 mmol/L   CO2 19 (L) 22 - 32 mmol/L   Glucose, Bld 146 (H) 65 - 99 mg/dL   BUN 26 (H) 6 - 20 mg/dL   Creatinine, Ser 0.91 0.61 - 1.24 mg/dL   Calcium 7.4 (L) 8.9 - 10.3 mg/dL   Total Protein 4.8 (L) 6.5 - 8.1 g/dL   Albumin 2.0 (L) 3.5 - 5.0 g/dL   AST 32 15 - 41 U/L   ALT 16 (L) 17 - 63 U/L   Alkaline Phosphatase 82 38 - 126 U/L   Total Bilirubin 2.2 (H) 0.3 - 1.2 mg/dL   GFR calc non Af Amer >60 >60 mL/min   GFR calc Af Amer >60 >60 mL/min    Comment: (NOTE) The eGFR has been calculated using the CKD EPI equation. This calculation has not been validated in all clinical situations. eGFR's persistently <60 mL/min signify possible Chronic Kidney Disease.    Anion gap 10 5 - 15  Hemoglobin and hematocrit, blood     Status: Abnormal   Collection Time: 01/02/17  9:42 AM  Result Value Ref Range   Hemoglobin 8.2 (L) 13.0 - 17.0 g/dL    Comment: POST TRANSFUSION SPECIMEN   HCT 25.4 (L) 39.0 - 52.0 %   Dg Chest 2 View  Result Date: 01/04/2017 CLINICAL DATA:  Sepsis, hypotensive EXAM: CHEST  2 VIEW COMPARISON:  12/11/2016, CT 11/27/2016 FINDINGS: Mild cardiomegaly with aortic atherosclerosis. Tiny pleural effusions. Vague opacities are suspected in the right perihilar region and left lung base. No pneumothorax. IMPRESSION: 1. Mild cardiomegaly with tiny pleural effusion 2. Vague right upper lobe/perihilar and left lung base pulmonary opacity, could reflect small foci of infection or  pulmonary nodules. Electronically Signed   By: Donavan Foil M.D.   On: 12/21/2016 16:25   Mr Brain Wo Contrast  Result Date: 12/16/2016 CLINICAL DATA:  Initial evaluation for acute altered mental status. History of min coma. EXAM: MRI HEAD WITHOUT CONTRAST TECHNIQUE: Multiplanar, multiecho pulse sequences of the brain and surrounding structures were obtained without intravenous contrast. COMPARISON:  None. FINDINGS: Brain: Study limited as the patient was unable to tolerate the full length of the exam. Additionally, images provided are markedly degraded by motion artifact. On coronal DWI sequence, there is a 5 mm focus of diffusion abnormality within the left cerebellar hemisphere, suspicious for possible small acute ischemic infarct (series 5, image 9). This is not well seen on axial sequence. No secondary findings to suggest associated hemorrhage or mass effect. No other convincing foci of restricted diffusion to suggest acute or subacute ischemia. Minimal patchy diffusion abnormality overlying the surface/cortical gray matter of the right parieto-occipital region favored to be artifactual in nature (series 3, image 27). No other acute intracranial process. Agenesis of the corpus callosum again noted. Encephalomalacia out within the right parietal region related to prior hemorrhage. No mass lesion or midline shift. No significant mass effect. No extra-axial fluid collection. Vascular: Major intracranial vascular flow voids are maintained at the skullbase. Skull and upper cervical spine: Craniocervical junction within normal limits. Upper cervical spine normal. Bone marrow signal intensity grossly within normal limits. No scalp soft tissue abnormality. Sinuses/Orbits: The globes and orbital soft tissues within normal limits. Scattered mucosal thickening throughout the paranasal sinuses. Air-fluid levels within the maxillary and right sphenoid sinus. Trace left mastoid effusion. Other: None. IMPRESSION: 1.  Limited exam as the patient was unable to tolerate the full length of the study. Additionally,  images provided are degraded by motion. 2. Punctate 5 mm focus of diffusion abnormality within the left cerebellar hemisphere, suspicious for small acute ischemic infarct. 3. No other definite acute intracranial abnormality. 4. 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 5. Acute paranasal sinusitis as above. Electronically Signed   By: Jeannine Boga M.D.   On: 01/08/2017 20:57    Assessment: 66 y.o. 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 fib presenting as a transfer from [facility] hospital for further workup of a small acute left cerebellar ischemic infarct.  1. It was difficult to obtain a history from the patient and do a complete neuro exam. No focal weakness noted. He does mention having intermittent headaches and dizziness. The dizziness could be explained by the location of his infarct.  2. DDx for underlying etiology for the infarction includes hypercoagulability in the setting of lymphoma, angiocentric lymphoma with focal vessel occlusion and cardioembolic stroke (atrial fibrillation).  3. Stroke Risk Factors - atrial fibrillation, hyperlipidemia and hypertension  Plan: 1. HgbA1c, fasting lipid panel 2. PT consult, OT consult, Speech consult 3. Echocardiogram 4. CTA of head and neck 5. Hold antiplatelet therapy in the setting of anemia and thrombocytopenia  6. Risk factor modification 7. Telemetry monitoring 8. Frequent neuro checks 9. Following stroke work up, consider transfer back to [facility] for care at the cancer center  Shela Leff, MD PGY3 - IMTS  I have seen and examined the patient. I have discussed the assessment and plan with Dr. Marlowe Sax.  Electronically signed: Dr. Kerney Elbe

## 2017-01-02 NOTE — Progress Notes (Signed)
MAR requested from [facility] for Dr Jana Hakim to review.  Pt brought incomplete MAR to last appointment.  When received, CSW will provide to MD.    Edwyna Shell, LCSW Clinical Social Worker Phone:  [phone]

## 2017-01-02 NOTE — Progress Notes (Signed)
Pt admitted to the unit at a transfer from [facility] via [facility]. Pt A&O x4 but confused with carried on conversation. Pt oriented to the unit room; fall safety precaution and prevention education completed with pt; bed alarm on; VSS; telemetry applied and verified with CCMD; NT called to second verify. lymphoedema to RUE elevated on pillow; pt has swollen scrotum; large lumps noted to right neck and under armpit; old bruising noted to right flank but no opened wounds or pressure ulcers noted. Pt in bed with call light within reach. Will closely monitor. Delia Heady RN

## 2017-01-02 NOTE — Progress Notes (Signed)
PROGRESS NOTE    [name] [name]  ONG:[id number] DOB: [birth year] DOA: 12/16/2016 PCP: Wenda Low, MD   Brief Narrative: [name] [name] is a 66 y.o. 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 Pt also reports he had a fall yesterday an dhit his head at [facility]. He was found to be dehydrated, anemic, thrombocytopenic, tachycardic and in acute renal failure.     Assessment & Plan:   Active Problems:   Essential hypertension   Hyperlipidemia   Atrial fibrillation with RVR (HCC)   Normocytic anemia   Lymphadenopathy   Mantle cell lymphoma (HCC)   Hypotension   AKI (acute kidney injury) ([place])   Hypotension, Near syncope, syncope prior to admission:  Differential include acute stroke, anemia, orthostatic hypotension vs dehydration from chemotherapy.  Currently there are no signs of infection on chest x-ray or urinalysis.  His blood pressure improved with fluid boluses and he is on maintenance fluids with normal saline at 75.    Anemia of chronic disease secondary to malignancy and chemotherapy. Status post 2 units of PRBC transfusion and his repeat hemoglobin is around 8.  Continue with PPI.  Stool for occult blood is negative.  Acute small infarct in the left cerebellar hemisphere:  Neurology consulted and transferred the patient to [facility] for further stroke work up.    Non-Hodgkin's mantle cell lymphoma on the right side of the neck Further management as per Dr. Rogelio Seen to Dr. Alessandra Grout secretary about the patient's transfer from [place] to [facility].   Mild acute kidney injury Suspect prerenal in etiology Improved renal parameters after hydration.   Mild thrombocytopenia Possibly from malignancy and chemotherapy. Continue to  monitor for signs of bleeding.  History of chronic atrial fibrillation rate controlled with amiodarone.  Resume Cardizem and beta-blocker if blood pressure tolerates. no anticoagulation due to severe anemia.   Right upper extremity swelling possibly from lymph node involvement from mantle cell lymphoma. Obtain right upper extremity venous duplex to evaluate for DVT.  DVT prophylaxis: scd's  Code Status: full code.  Family Communication: none at bedside.  Disposition Plan: Transfer the patient to [facility]: neurology  Procedures: MRI Brain.  Antimicrobials: none.   Subjective: Patient confused does not appear to be in any kind of distress  Objective: Vitals:   01/02/17 0425 01/02/17 0520 01/02/17 0800 01/02/17 1000  BP:  121/75  (!) 149/69  Pulse:  88 98 (!) 112  Resp:  (!) 28 (!) 24 20  Temp: 97.6 F (36.4 C) 97.8 F (36.6 C) 97.7 F (36.5 C)   TempSrc: Oral Axillary Oral   SpO2:  98% 97% 98%  Weight:      Height:        Intake/Output Summary (Last 24 hours) at 01/02/2017 1027 Last data filed at 01/02/2017 1000 Gross per 24 hour  Intake 3120 ml  Output 800 ml  Net 2320 ml   Filed Weights   12/28/2016 2130  Weight: 84.4 kg (186 lb 1.1 oz)    Examination:  General exam: Appears calm and comfortable  Respiratory system: Clear to auscultation. Respiratory effort normal. Cardiovascular system: S1 & S2 heard, irregular no murmurs. No pedal edema. Gastrointestinal system: Abdomen is nondistended, soft and nontender. No organomegaly or masses felt. Normal bowel sounds heard. Central nervous system:  Alert but confused.  Able to move all extremities.   Extremities: Right upper extremity swelling, no tenderness Skin: No rashes, lesions or ulcers Psychiatry: Mood & affect appropriate.     Data Reviewed: I have personally reviewed following labs and imaging studies  CBC: Recent Labs  Lab 01/10/2017 1217 01/03/2017 1511 01/08/2017 1536  WBC 8.4 6.7  --     NEUTROABS  --  3.5  --   HGB 6.8* 6.2* 6.5*  HCT 22.0* 20.1* 19.0*  MCV 97.0 98.0  --   PLT 82* 75*  --    Basic Metabolic Panel: Recent Labs  Lab 01/11/2017 1217 12/22/2016 1511 12/30/2016 1536 01/02/17 0827  NA 143 141 142 138  K 4.5 4.2 4.2 4.2  CL  --  111 108 109  CO2 19* 23  --  19*  GLUCOSE 100 103* 100* 146*  BUN 21.9 24* 20 26*  CREATININE 1.0 1.29* 1.20 0.91  CALCIUM 8.3* 8.1*  --  7.4*   GFR: Estimated Creatinine Clearance: 87.6 mL/min (by C-G formula based on SCr of 0.91 mg/dL). Liver Function Tests: Recent Labs  Lab 12/17/2016 1217 01/12/2017 1511 01/02/17 0827  AST 32 36 32  ALT 12 15* 16*  ALKPHOS 110 88 82  BILITOT 1.83* 1.6* 2.2*  PROT 5.2* 5.2* 4.8*  ALBUMIN 2.1* 2.1* 2.0*   No results for input(s): LIPASE, AMYLASE in the last 168 hours. No results for input(s): AMMONIA in the last 168 hours. Coagulation Profile: No results for input(s): INR, PROTIME in the last 168 hours. Cardiac Enzymes: No results for input(s): CKTOTAL, CKMB, CKMBINDEX, TROPONINI in the last 168 hours. BNP (last 3 results) No results for input(s): PROBNP in the last 8760 hours. HbA1C: No results for input(s): HGBA1C in the last 72 hours. CBG: No results for input(s): GLUCAP in the last 168 hours. Lipid Profile: No results for input(s): CHOL, HDL, LDLCALC, TRIG, CHOLHDL, LDLDIRECT in the last 72 hours. Thyroid Function Tests: No results for input(s): TSH, T4TOTAL, FREET4, T3FREE, THYROIDAB in the last 72 hours. Anemia Panel: No results for input(s): VITAMINB12, FOLATE, FERRITIN, TIBC, IRON, RETICCTPCT in the last 72 hours. Sepsis Labs: Recent Labs  Lab 12/20/2016 1536 12/25/2016 1822  LATICACIDVEN 2.58* 1.47    No results found for this or any previous visit (from the past 240 hour(s)).       Radiology Studies: Dg Chest 2 View  Result Date: 01/10/2017 CLINICAL DATA:  Sepsis, hypotensive EXAM: CHEST  2 VIEW COMPARISON:  12/11/2016, CT 11/27/2016 FINDINGS: Mild  cardiomegaly with aortic atherosclerosis. Tiny pleural effusions. Vague opacities are suspected in the right perihilar region and left lung base. No pneumothorax. IMPRESSION: 1. Mild cardiomegaly with tiny pleural effusion 2. Vague right upper lobe/perihilar and left lung base pulmonary opacity, could reflect small foci of infection or pulmonary nodules. Electronically Signed   By: Donavan Foil M.D.   On: 12/21/2016 16:25   Mr Brain Wo Contrast  Result Date: 12/20/2016 CLINICAL DATA:  Initial evaluation for acute altered mental status. History of min coma. EXAM: MRI HEAD WITHOUT CONTRAST TECHNIQUE: Multiplanar, multiecho pulse sequences of the brain and surrounding structures were obtained without intravenous contrast. COMPARISON:  None. FINDINGS: Brain: Study limited as the patient was unable to tolerate the full length of the exam. Additionally, images provided are markedly degraded by motion artifact. On coronal DWI sequence, there is a 5 mm focus of diffusion abnormality within the left cerebellar hemisphere, suspicious for possible small acute ischemic infarct (series 5, image 9). This  is not well seen on axial sequence. No secondary findings to suggest associated hemorrhage or mass effect. No other convincing foci of restricted diffusion to suggest acute or subacute ischemia. Minimal patchy diffusion abnormality overlying the surface/cortical gray matter of the right parieto-occipital region favored to be artifactual in nature (series 3, image 27). No other acute intracranial process. Agenesis of the corpus callosum again noted. Encephalomalacia out within the right parietal region related to prior hemorrhage. No mass lesion or midline shift. No significant mass effect. No extra-axial fluid collection. Vascular: Major intracranial vascular flow voids are maintained at the skullbase. Skull and upper cervical spine: Craniocervical junction within normal limits. Upper cervical spine normal. Bone marrow  signal intensity grossly within normal limits. No scalp soft tissue abnormality. Sinuses/Orbits: The globes and orbital soft tissues within normal limits. Scattered mucosal thickening throughout the paranasal sinuses. Air-fluid levels within the maxillary and right sphenoid sinus. Trace left mastoid effusion. Other: None. IMPRESSION: 1. Limited exam as the patient was unable to tolerate the full length of the study. Additionally, images provided are degraded by motion. 2. Punctate 5 mm focus of diffusion abnormality within the left cerebellar hemisphere, suspicious for small acute ischemic infarct. 3. No other definite acute intracranial abnormality. 4. 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 5. Acute paranasal sinusitis as above. Electronically Signed   By: Jeannine Boga M.D.   On: 12/14/2016 20:57        Scheduled Meds: . allopurinol  300 mg Oral Daily  . amiodarone  200 mg Oral Daily  . atorvastatin  20 mg Oral q1800  . Chlorhexidine Gluconate Cloth  6 each Topical Q0600  . ferrous sulfate  325 mg Oral Q breakfast  . folic acid  1 mg Oral Daily  . mupirocin ointment  1 application Nasal BID  . pantoprazole  40 mg Oral Daily  . pantoprazole  40 mg Oral Q0600   Continuous Infusions: . sodium chloride    . sodium chloride 100 mL/hr at 01/02/17 0500     LOS: 0 days    Time spent: 35 minutes.     Hosie Poisson, MD [facility] Pager [phone]  If 7PM-7AM, please contact night-coverage [email or web address] Password [facility] 01/02/2017, 10:27 AM

## 2017-01-03 ENCOUNTER — Inpatient Hospital Stay (HOSPITAL_COMMUNITY): Payer: Medicare Other

## 2017-01-03 DIAGNOSIS — C8311 Mantle cell lymphoma, lymph nodes of head, face, and neck: Secondary | ICD-10-CM

## 2017-01-03 DIAGNOSIS — M7989 Other specified soft tissue disorders: Secondary | ICD-10-CM

## 2017-01-03 LAB — BPAM RBC
BLOOD PRODUCT EXPIRATION DATE: 201901122359
BLOOD PRODUCT EXPIRATION DATE: 201901152359
ISSUE DATE / TIME: 201812202200
ISSUE DATE / TIME: 201812210202
UNIT TYPE AND RH: 6200
UNIT TYPE AND RH: 6200

## 2017-01-03 LAB — TYPE AND SCREEN
ABO/RH(D): A POS
Antibody Screen: NEGATIVE
UNIT DIVISION: 0
UNIT DIVISION: 0

## 2017-01-03 LAB — URINE CULTURE: Culture: <10000 — AB

## 2017-01-03 LAB — HEMOGLOBIN AND HEMATOCRIT, BLOOD
HCT: 26.2 % — ABNORMAL LOW (ref 39.0–52.0)
Hemoglobin: 8 g/dL — ABNORMAL LOW (ref 13.0–17.0)

## 2017-01-03 MED ORDER — METOPROLOL TARTRATE 50 MG PO TABS
50.0000 mg | ORAL_TABLET | Freq: Two times a day (BID) | ORAL | Status: DC
  Administered 2017-01-03 – 2017-01-05 (×5): 50 mg via ORAL
  Filled 2017-01-03 (×6): qty 1

## 2017-01-03 MED ORDER — DILTIAZEM HCL ER COATED BEADS 240 MG PO CP24: 240.0000 mg | ORAL_CAPSULE | Freq: Every day | ORAL | Status: DC

## 2017-01-03 MED ORDER — DILTIAZEM HCL ER COATED BEADS 240 MG PO CP24
240.0000 mg | ORAL_CAPSULE | Freq: Every day | ORAL | Status: DC
  Administered 2017-01-04 – 2017-01-05 (×2): 240 mg via ORAL
  Filled 2017-01-03 (×3): qty 1

## 2017-01-03 NOTE — Progress Notes (Signed)
[place] TEAM 1 - Stepdown/ICU TEAM  [name] [name]  YSA:[id number] DOB: 04-21-1950 DOA: 12/23/2016 PCP: Wenda Low, MD    Brief Narrative:  66 y.o.malewith a history ofNon Hodgkin's mantle cell Lymphoma s/p 3 cycles of Rituximab, atrial fibrillation, normocytic anemia, HTN, HLD, and CVA who presented from the oncology infusion center with generalized weakness and near syncope. Pt also reported he had a fall the day prior in which he hit his head. He was found to be dehydrated, anemic, thrombocytopenic, tachycardic and in acute renal failure.   Subjective: Sleeping soundly.  Awakens for my exam.  Denies cp, sob, or abdom pain.  Easily goes back to sleep.  No family present in room.    Assessment & Plan:  Hypotension w/ near syncope  Diff includes acute stroke, anemia, orthostatic hypotension/dehydration from chemotherapy - likely all of the above - BP has improved rapidly w/ simple volume expansion - resume BP meds in stepwise fashion   Anemia of chronic disease secondary to malignancy and chemotherapy. S/P 2U PRBC transfusion - stool for occult blood is negative - Hgb holding steady for now - refused EGD for w/u during recent hospital stay  Recent Labs  Lab 12/17/2016 1217 12/21/2016 1511 01/06/2017 1536 01/02/17 0942 01/03/17 1120  HGB 6.8* 6.2* 6.5* 8.2* 8.0*    Acute small infarct in the left cerebellar hemisphere  Neurology directing complete CVA workup   Non-Hodgkin's mantle cell lymphoma on the right side of the neck Further management as per Dr. Jana Hakim  Mild acute kidney injury prerenal in etiology - crt has normalized w/ volume expansion   Mild thrombocytopenia ?due to malignancy and chemotherapy - holding ASA for now - recheck plt in AM  HLD Cont Lipitor   Chronic atrial fibrillation rate controlled with amiodarone - no anticoagulation due to severe anemia - resume CCB and BB in a stepwise fashion as BP allows   Right upper extremity swelling possibly  from lymph node involvement from mantle cell lymphoma. R upper extremity venous duplex w/o evidence of DVT - hx suggests this is a long standing issue   1 of 2 Blood Cx + for coag neg Meth resistant Staph Suspicious for contaminant - follow clinically w/o abx tx for now   DVT prophylaxis: SCDs Code Status: FULL CODE Family Communication: no family present at time of exam  Disposition Plan: tele bed - PT/OT - needs SNF   Consultants:  Neurology   Procedures: none  Antimicrobials:  none   Objective: Blood pressure (!) 143/72, pulse 86, temperature 98.1 F (36.7 C), temperature source Oral, resp. rate 20, height 6' (1.829 m), weight 84.4 kg (186 lb 1.1 oz), SpO2 99 %.  Intake/Output Summary (Last 24 hours) at 01/03/2017 0827 Last data filed at 01/03/2017 0645 Gross per 24 hour  Intake 300 ml  Output 1000 ml  Net -700 ml   Filed Weights   01/06/2017 2130  Weight: 84.4 kg (186 lb 1.1 oz)    Examination: General: No acute respiratory distress  Lungs: Clear to auscultation bilaterally - no wheezing  Cardiovascular: Regular rate without murmur Abdomen: Nontender, soft, bowel sounds positive Extremities: No significant edema bilateral lower extremities  CBC: Recent Labs  Lab 12/31/2016 1217 01/04/2017 1511 01/02/2017 1536 01/02/17 0942  WBC 8.4 6.7  --   --   NEUTROABS  --  3.5  --   --   HGB 6.8* 6.2* 6.5* 8.2*  HCT 22.0* 20.1* 19.0* 25.4*  MCV 97.0 98.0  --   --  PLT 82* 75*  --   --    Basic Metabolic Panel: Recent Labs  Lab 12/16/2016 1217 12/13/2016 1511 12/23/2016 1536 01/02/17 0827  NA 143 141 142 138  K 4.5 4.2 4.2 4.2  CL  --  111 108 109  CO2 19* 23  --  19*  GLUCOSE 100 103* 100* 146*  BUN 21.9 24* 20 26*  CREATININE 1.0 1.29* 1.20 0.91  CALCIUM 8.3* 8.1*  --  7.4*   GFR: Estimated Creatinine Clearance: 87.6 mL/min (by C-G formula based on SCr of 0.91 mg/dL).  Liver Function Tests: Recent Labs  Lab 12/23/2016 1217 12/22/2016 1511 01/02/17 0827  AST  32 36 32  ALT 12 15* 16*  ALKPHOS 110 88 82  BILITOT 1.83* 1.6* 2.2*  PROT 5.2* 5.2* 4.8*  ALBUMIN 2.1* 2.1* 2.0*    HbA1C: Hgb A1c MFr Bld  Date/Time Value Ref Range Status  12/12/2016 01:48 AM 5.2 4.8 - 5.6 % Final    Comment:    (NOTE) Pre diabetes:          5.7%-6.4% Diabetes:              >6.4% Glycemic control for   <7.0% adults with diabetes      Recent Results (from the past 240 hour(s))  Blood Culture (routine x 2)     Status: None (Preliminary result)   Collection Time: 12/30/2016  3:26 PM  Result Value Ref Range Status   Specimen Description BLOOD LEFT FOREARM  Final   Special Requests   Final    BOTTLES DRAWN AEROBIC AND ANAEROBIC Blood Culture adequate volume   Culture  Setup Time   Final    GRAM POSITIVE COCCI IN BOTH AEROBIC AND ANAEROBIC BOTTLES CRITICAL RESULT CALLED TO, READ BACK BY AND VERIFIED WITH: N GLOGOVAC,PHARMD AT 1240 01/02/17 BY L BENFIELD Performed at [facility], [place] [address] [place], [place] [address]    Culture GRAM POSITIVE COCCI  Final   Report Status PENDING  Incomplete  Blood Culture ID Panel (Reflexed)     Status: Abnormal   Collection Time: 12/23/2016  3:26 PM  Result Value Ref Range Status   Enterococcus species NOT DETECTED NOT DETECTED Final   Listeria monocytogenes NOT DETECTED NOT DETECTED Final   Staphylococcus species DETECTED (A) NOT DETECTED Final    Comment: Methicillin (oxacillin) resistant coagulase negative staphylococcus. Possible blood culture contaminant (unless isolated from more than one blood culture draw or clinical case suggests pathogenicity). No antibiotic treatment is indicated for blood  culture contaminants. CRITICAL RESULT CALLED TO, READ BACK BY AND VERIFIED WITH: N GLOGOVAC,PHARMD AT 1240 01/02/17 BY L BENFIELD    Staphylococcus aureus NOT DETECTED NOT DETECTED Final   Methicillin resistance DETECTED (A) NOT DETECTED Final    Comment: CRITICAL RESULT CALLED TO, READ BACK BY AND VERIFIED WITH: N  GLOGOVAC,PHARMD AT 1240 01/02/17 BY L BENFIELD    Streptococcus species NOT DETECTED NOT DETECTED Final   Streptococcus agalactiae NOT DETECTED NOT DETECTED Final   Streptococcus pneumoniae NOT DETECTED NOT DETECTED Final   Streptococcus pyogenes NOT DETECTED NOT DETECTED Final   Acinetobacter baumannii NOT DETECTED NOT DETECTED Final   Enterobacteriaceae species NOT DETECTED NOT DETECTED Final   Enterobacter cloacae complex NOT DETECTED NOT DETECTED Final   Escherichia coli NOT DETECTED NOT DETECTED Final   Klebsiella oxytoca NOT DETECTED NOT DETECTED Final   Klebsiella pneumoniae NOT DETECTED NOT DETECTED Final   Proteus species NOT DETECTED NOT DETECTED Final  Serratia marcescens NOT DETECTED NOT DETECTED Final   Haemophilus influenzae NOT DETECTED NOT DETECTED Final   Neisseria meningitidis NOT DETECTED NOT DETECTED Final   Pseudomonas aeruginosa NOT DETECTED NOT DETECTED Final   Candida albicans NOT DETECTED NOT DETECTED Final   Candida glabrata NOT DETECTED NOT DETECTED Final   Candida krusei NOT DETECTED NOT DETECTED Final   Candida parapsilosis NOT DETECTED NOT DETECTED Final   Candida tropicalis NOT DETECTED NOT DETECTED Final    Comment: Performed at [facility], [place] [address] [place], [place] [address]  Blood Culture (routine x 2)     Status: None (Preliminary result)   Collection Time: 12/26/2016  3:57 PM  Result Value Ref Range Status   Specimen Description BLOOD LEFT FOREARM  Final   Special Requests   Final    BOTTLES DRAWN AEROBIC AND ANAEROBIC Blood Culture adequate volume   Culture   Final    NO GROWTH < 24 HOURS Performed at [facility], [place] [address] [place], [place] [address]    Report Status PENDING  Incomplete     Scheduled Meds: . allopurinol  300 mg Oral Daily  . amiodarone  200 mg Oral Daily  . atorvastatin  20 mg Oral q1800  . Chlorhexidine Gluconate Cloth  6 each Topical Q0600  . ferrous sulfate  325 mg Oral Q breakfast  . folic  acid  1 mg Oral Daily  . mupirocin ointment  1 application Nasal BID  . pantoprazole  40 mg Oral Daily  . pantoprazole  40 mg Oral Q0600     LOS: 1 day   [name], MD [facility] Office  [phone] Pager - Text Page per Amion as per below:  On-Call/Text Page:      [email or web address]      password [email or web address]  If 7PM-7AM, please contact night-coverage [email or web address] Password [facility] 01/03/2017, 8:27 AM

## 2017-01-03 NOTE — Progress Notes (Signed)
STROKE TEAM PROGRESS NOTE   HISTORY OF PRESENT ILLNESS (per record) [name] [name] is an 66 y.o. 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 fib presented to [facility] hospital on January 01, 2017 from oncology infusion center for generalized weakness and a near syncopal episode. He had a fall the day prior and hadhit his head. At the ED, he was found to be hypotensive andtachycardic.Labs showing hemoglobin 6.2, platelets75,000, creatinine mildly above baseline,and an elevated lactic acid. UA not suggestive of an infection. MRI of brain revealed a punctate 5 mm focus of diffusion abnormality within the left cerebellar hemisphere, suspicious for small acute ischemic infarct. No other definite acute intracranial abnormality. 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 He has been transferred to [facility] for stroke team evaluation.  Patient was restless and agitated. It is was very difficult to obtain a history from him. States he was eating breakfast with his therapist present at his side at [facility]. He lost his balance and fell as he was trying to put a cup of water down on a table. Denies losing consciousness. He remembers the staff gathering around him when this happened. Denies having any speech impairment or visual disturbance at that time. He is not able to tell whether he experienced any focal weakness or numbness. Does report having intermittent headaches and dizziness but does not elaborate further. States he remembers going to the cancer center the following day but is not sure why he was transferred to the hospital.  LSN: Unclear  tPA Given: No: Subcentimeter completed stroke.      SUBJECTIVE (INTERVAL HISTORY) There is no family at bedside. No events overnight.   OBJECTIVE Temp:   [97.7 F (36.5 C)-98.2 F (36.8 C)] 98.1 F (36.7 C) (12/22 0644) Pulse Rate:  [47-118] 86 (12/22 0644) Cardiac Rhythm: Atrial fibrillation (12/21 2000) Resp:  [20-24] 20 (12/22 0644) BP: (107-149)/(64-76) 143/72 (12/22 0644) SpO2:  [97 %-100 %] 99 % (12/22 0644)  CBC:  Recent Labs  Lab 01/10/2017 1217  12/15/2016 1511 12/31/2016 1536 01/02/17 0942  WBC 8.4  --  6.7  --   --   NEUTROABS  --   --  3.5  --   --   HGB 6.8*   < > 6.2* 6.5* 8.2*  HCT 22.0*   < > 20.1* 19.0* 25.4*  MCV 97.0  --  98.0  --   --   PLT 82*  --  75*  --   --    < > = values in this interval not displayed.    Basic Metabolic Panel:  Recent Labs  Lab 12/26/2016 1511 12/20/2016 1536 01/02/17 0827  NA 141 142 138  K 4.2 4.2 4.2  CL 111 108 109  CO2 23  --  19*  GLUCOSE 103* 100* 146*  BUN 24* 20 26*  CREATININE 1.29* 1.20 0.91  CALCIUM 8.1*  --  7.4*    Lipid Panel:     Component Value Date/Time   CHOL 99 12/12/2016 0148   TRIG 67 12/12/2016 0148   HDL 16 (L) 12/12/2016 0148   CHOLHDL 6.2 12/12/2016 0148   VLDL 13 12/12/2016 0148   LDLCALC 70 12/12/2016 0148   HgbA1c:  Lab Results  Component Value Date   HGBA1C 5.2 12/12/2016   Urine Drug Screen: No results found for: LABOPIA, COCAINSCRNUR,  LABBENZ, AMPHETMU, THCU, LABBARB  Alcohol Level No results found for: ETH  IMAGING  Ct Angio Head W Or Wo Contrast Ct Angio Neck W Or Wo Contrast 01/02/2017 IMPRESSION:  1. No emergent large vessel occlusion or high-grade stenosis of the major intracranial and cervical arteries.  2. Aortic Atherosclerosis (ICD10-I70.0).  3. Massively enlarged bilateral cervical lymph nodes throughout the neck, generally unchanged compared to 11/21/2016 and consistent with the diagnosis of non-Hodgkin's lymphoma.  4. 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  5. Small bilateral pleural effusions.    Dg Chest 2 View 12/23/2016 IMPRESSION:  1. Mild cardiomegaly with tiny pleural  effusion  2. Vague right upper lobe/perihilar and left lung base pulmonary opacity, could reflect small foci of infection or pulmonary nodules.    Mr Brain Wo Contrast 12/27/2016 IMPRESSION:  1. Limited exam as the patient was unable to tolerate the full length of the study. Additionally, images provided are degraded by motion.  2. Punctate 5 mm focus of diffusion abnormality within the left cerebellar hemisphere, suspicious for small acute ischemic infarct.  3. No other definite acute intracranial abnormality.  4. 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  5. Acute paranasal sinusitis as above.    Transthoracic Echocardiogram  11/29/2016 Study Conclusions - Left ventricle: Wall thickness was increased in a pattern of   moderate LVH. Systolic function was normal. The estimated   ejection fraction was in the range of 55% to 60%. Wall motion was   normal; there were no regional wall motion abnormalities. The   study is not technically sufficient to allow evaluation of LV   diastolic function. - Aortic valve: Trileaflet. Sclerosis without stenosis. There was   trivial regurgitation. - Mitral valve: Mildly thickened leaflets . There was trivial   regurgitation. - Left atrium: Moderately dilated. - Right ventricle: The cavity size was mildly dilated. Low normal   systolic function. - Right atrium: Moderately dilated. - Tricuspid valve: There was mild regurgitation. - Pulmonary arteries: PA peak pressure: 35 mm Hg (S). - Inferior vena cava: The vessel was normal in size. The   respirophasic diameter changes were in the normal range (>= 50%),   consistent with normal central venous pressure. - Pericardium, extracardiac: A trivial pericardial effusion was   identified posterior to the heart. Features were not consistent   with tamponade physiology. Impressions: - LVEF 55-60%, moderate LVH, normal wall motion, aortic valve   sclerosis with trivial AI,  moderate biatrial enlargement, mild   RVE with low normal RV systolic function, mild TR, RVSP 35 mmHg,   normal IVC, trivial posterior pericardial effusion.      PHYSICAL EXAM Vitals:   01/02/17 1500 01/02/17 1700 01/03/17 0042 01/03/17 0644  BP: 112/76 114/72 134/64 (!) 143/72  Pulse: (!) 111 (!) 118 85 86  Resp: 20 20 20 20   Temp: 98.2 F (36.8 C)  97.8 F (36.6 C) 98.1 F (36.7 C)  TempSrc: Oral  Oral Oral  SpO2: 99% 99% 98% 99%  Weight:      Height:        PHYSICAL EXAM Physical exam: Exam: Gen: no acute distress, slightly agitated Eyes: anicteric sclerae, moist conjunctivae                    CV: no MRG, no carotid bruits, no peripheral edema Mental Status: Alert, follows simple commands but not cooperative  Neuro: Detailed Neurologic Exam  Speech:    No aphasia, no dysarthria  Cranial Nerves:    The pupils are equal, round, and reactive to light.. Attempted, Fundi not visualized due to noncooperation.Ttracks examiner across room. Visual fields appear full but difficult exam due to non-cooperation and complaints about his positioning and wanting to be moved. Face symmetric, Tongue midline. Hearing intact to voice. Shoulder shrug intact  Motor Observation:    no involuntary movements noted. Tone appears normal.     Strength:    Difficult exam due to non-cooperation but appears to be moving all extremities equally and anti-gravity     Sensation:  Intact to LT, moves to stim x 4  Plantars downgoing.     ASSESSMENT/PLAN Mr. [name] [name] is a 66 y.o. 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 Fib (not anticoagulated due to anemia) presenting with weakness, near syncope, and a recent fall. He did not receive IV t-PA due to a small completed stroke.  Stroke:  Punctate 5 mm infarct within the left cerebellar hemisphere likely embolic secondary to  afib.  Resultant  Appears to have no focal deficits but difficult exam due to non-cooperation  CT head - Old right frontoparietal infarct.   MRI head - Punctate 5 mm focus of diffusion abnormality Lt cerebellar hemisphere,   MRA head - not performed  CTA H&N -  No emergent large vessel occlusion or high-grade stenosis.  Carotid Doppler - CTA neck  2D Echo - 11/29/2016 - LVEF 55-60%. No cardiac source of emboli identified.  Rt UE Venous Duplex - 11/29/2016 - No evidence of deep or superficial thrombosis.  Rt UE Venous Duplex - 01/03/2017 - There is no obvious evidence of DVT or SVT  LDL - 70  HgbA1c - 5.2  VTE prophylaxis - SCDs  Diet regular Room service appropriate? Yes; Fluid consistency: Thin  aspirin 81 mg daily prior to admission, now on No antithrombotic (Held 2/2 anemia / thrombocytopenia)  Ongoing aggressive stroke risk factor management  Therapy recommendations:  pending  Disposition:  Pending  Hypertension  Stable to mildly low  Permissive hypertension (OK if < 220/120) but gradually normalize in 5-7 days  Long-term BP goal normotensive  Hyperlipidemia  Home meds:  Zocor 40 mg daily PTA   LDL 70, goal < 70  Now on Lipitor 20 mg daily  Continue statin at discharge   Anemia / Thrombocytopenia  Pt declined EGD after a GI consult previous admission.  Hb - 6.8->6.2->6.5->8.2  Plts - 82->75  Transfused  Stool for occult blood - pending  Antiplatelet therapy on hold, will defer to hematology/oncology  Other Stroke Risk Factors  Advanced age  Former cigarette smoker - quit 30 yrs ago  Hx stroke/TIA  Afib - not anticoagulated  Other Active Problems  Non-Hodgkin's mantle cell lymphoma - oncology following   Plan / Recommendations Antiplatelet therapy on hold, will defer to hematology/oncology  Hospital day # 1  Stroke team will sign off at this time, please re-consult as necessary  To contact Stroke Continuity provider, please  refer to [email or web address] After hours, contact General Neurology

## 2017-01-03 NOTE — Progress Notes (Addendum)
VASCULAR LAB PRELIMINARY  PRELIMINARY  PRELIMINARY  PRELIMINARY  Right upper extremity venous duplex completed.    Preliminary report:  Patient's arm has been swollen since 1993. There is no obvious evidence of DVT or SVT noted in the right upper extremity. There are a significant number of large, vascularized lymph nodes throughout neck, shoulder, and chest. No change since study done 11/29/2016. Autumm Hattery, RVT 01/03/2017, 10:14 AM

## 2017-01-03 NOTE — Evaluation (Signed)
Physical Therapy Evaluation Patient Details Name: [name] [name] MRN: [id number] DOB: 06-07-1950 Today's Date: 01/03/2017   History of Present Illness  Patient is a 66 y/o male who presents from cancer ca with hypotension, generalized weakness and dizziness with recent fall at SNF. Hgb 6.6 s/p transfusions. MRI- acute left cerebellar infarct. PMH includes HTN, HLD, CKD, CHF, A-fib, prior CVA in 2008, non-Hodgkin's mantle cell lymphoma   Clinical Impression  Patient presents with generalized weakness, dizziness, confusion and impaired mobility s/p above. VSS throughout except with elevated HR in A-fib up to 124 bpm just sitting EOB. Pt with worsening dizziness sitting EOB demanding to return to supine. Unsure of pt's PLOF as pt not a great historian. Reports falls. Pt [facility] at [facility]- unsure if this is in the ALF vs SNF. OOB mobility needs to be assessed. Will follow acutely to maximize independence and mobility prior to return to SNF.     Follow Up Recommendations SNF    Equipment Recommendations  None recommended by PT    Recommendations for Other Services OT consult     Precautions / Restrictions Precautions Precautions: Fall Precaution Comments: Dizziness Restrictions Weight Bearing Restrictions: No      Mobility  Bed Mobility Overal bed mobility: Needs Assistance Bed Mobility: Rolling;Sidelying to Sit Rolling: Min assist Sidelying to sit: Mod assist;HOB elevated       General bed mobility comments: Assist with LEs, bottom and elevate trunk to get to EOB. + dizziness. BP stable in supine and sitting with appropriate response.   Transfers                 General transfer comment: Deferred secondary to pt reporting dizziness and needed to return to supine.  Ambulation/Gait                Stairs            Wheelchair Mobility    Modified Rankin (Stroke Patients Only) Modified Rankin (Stroke Patients Only) Pre-Morbid Rankin Score: Moderately  severe disability Modified Rankin: Moderately severe disability     Balance Overall balance assessment: Needs assistance Sitting-balance support: Feet supported;Bilateral upper extremity supported Sitting balance-Leahy Scale: Fair Sitting balance - Comments: Able to sit statically with close Min guard assist; nervous about falling over due to dizziness.        Standing balance comment: Deferred                             Pertinent Vitals/Pain Pain Assessment: No/denies pain    Home Living Family/patient expects to be discharged to:: Skilled nursing facility([facility] vs SNF- unsure)                      Prior Function Level of Independence: Needs assistance   Gait / Transfers Assistance Needed: pt a poor historian, but sound as if pt needed assist for mobility lately. Reports doing some walking but not much PTA.   ADL's / Homemaking Assistance Needed: Assist with ADls.         Hand Dominance        Extremity/Trunk Assessment   Upper Extremity Assessment Upper Extremity Assessment: Defer to OT evaluation    Lower Extremity Assessment Lower Extremity Assessment: Generalized weakness       Communication   Communication: HOH  Cognition Arousal/Alertness: Awake/alert Behavior During Therapy: WFL for tasks assessed/performed Overall Cognitive Status: No family/caregiver present to determine baseline cognitive functioning  General Comments: Tangential with speech, difficult to get chronicle of events/history. Knows he is in the hospital and it is December 2018.      General Comments General comments (skin integrity, edema, etc.): Supine BP 121/50, sitting BP 120/86    Exercises     Assessment/Plan    PT Assessment Patient needs continued PT services  PT Problem List Decreased strength;Decreased balance;Decreased cognition;Decreased mobility;Decreased activity tolerance;Decreased safety  awareness       PT Treatment Interventions DME instruction;Balance training;Patient/family education;Gait training;Therapeutic activities;Neuromuscular re-education;Therapeutic exercise;Cognitive remediation;Functional mobility training;Wheelchair mobility training    PT Goals (Current goals can be found in the Care Plan section)  Acute Rehab PT Goals Patient Stated Goal: to get some rest PT Goal Formulation: With patient Time For Goal Achievement: 01/17/17 Potential to Achieve Goals: Fair    Frequency Min 3X/week   Barriers to discharge        Co-evaluation               AM-PAC PT "6 Clicks" Daily Activity  Outcome Measure Difficulty turning over in bed (including adjusting bedclothes, sheets and blankets)?: Unable Difficulty moving from lying on back to sitting on the side of the bed? : Unable Difficulty sitting down on and standing up from a chair with arms (e.g., wheelchair, bedside commode, etc,.)?: Unable Help needed moving to and from a bed to chair (including a wheelchair)?: A Lot Help needed walking in hospital room?: Total Help needed climbing 3-5 steps with a railing? : Total 6 Click Score: 7    End of Session   Activity Tolerance: Treatment limited secondary to medical complications (Comment)(dizziness.) Patient left: in bed;with call bell/phone within reach;with bed alarm set Nurse Communication: Mobility status PT Visit Diagnosis: Dizziness and giddiness (R42);Muscle weakness (generalized) (M62.81);Difficulty in walking, not elsewhere classified (R26.2)    Time: 8144-8185 PT Time Calculation (min) (ACUTE ONLY): 23 min   Charges:   PT Evaluation $PT Eval Moderate Complexity: 1 Mod PT Treatments $Therapeutic Activity: 8-22 mins   PT G Codes:        Wray Kearns, PT, DPT [phone]    Marguarite Arbour A Arthurine Oleary 01/03/2017, 1:01 PM

## 2017-01-03 NOTE — Progress Notes (Signed)
Elvert [place]   DOB:[birth year]   VZ#:[id number]   AJO#:[id number]  Subjective:  Bensyn tells me he "is in a cloud," meaning a bit confused. Nevertheless he knew today was Saturday. He wasn't quite sure which doctor I was (I am seeing him in a different environment); he tells me pain is not a problem. He denies h/a, N/V. Feeling less dizzy though still "not all there." Not sure where he is how how he got here or why. No family in room   Objective: middle aged White man examined in bed Vitals:   01/03/17 0644 01/03/17 1040  BP: (!) 143/72 138/68  Pulse: 86 78  Resp: 20 20  Temp: 98.1 F (36.7 C) 98 F (36.7 C)  SpO2: 99% 99%    Body mass index is 25.24 kg/m.  Intake/Output Summary (Last 24 hours) at 01/03/2017 1043 Last data filed at 01/03/2017 0645 Gross per 24 hour  Intake 100 ml  Output 1000 ml  Net -900 ml     Sclerae unicteric  His cerical/Bowman adenopathy is improved  Lungs no rales or wheezes--auscultated anterolaterally  Heart regular rate and rhythm  Abdomen soft, +BS  Neuro nonfocal   CBG (last 3)  No results for input(s): GLUCAP in the last 72 hours.   Labs:  Lab Results  Component Value Date   WBC 6.7 12/30/2016   HGB 8.2 (L) 01/02/2017   HCT 25.4 (L) 01/02/2017   MCV 98.0 12/29/2016   PLT 75 (L) 12/29/2016   NEUTROABS 3.5 12/22/2016    _0 @  Urine Studies No results for input(s): UHGB, CRYS in the last 72 hours.  Invalid input(s): UACOL, UAPR, USPG, UPH, UTP, UGL, UKET, UBIL, UNIT, UROB, ULEU, UEPI, UWBC, [place], [place]  Basic Metabolic Panel: Recent Labs  Lab 12/21/2016 1217  12/28/2016 1511 01/03/2017 1536 01/02/17 0827  NA 143  --  141 142 138  K 4.5   < > 4.2 4.2 4.2  CL  --   --  111 108 109  CO2 19*  --  23  --  19*  GLUCOSE 100  --  103* 100* 146*  BUN 21.9  --  24* 20 26*  CREATININE 1.0  --  1.29* 1.20 0.91  CALCIUM 8.3*  --  8.1*  --  7.4*   < > = values in this interval not displayed.   GFR Estimated  Creatinine Clearance: 87.6 mL/min (by C-G formula based on SCr of 0.91 mg/dL). Liver Function Tests: Recent Labs  Lab 12/30/2016 1217 12/21/2016 1511 01/02/17 0827  AST 32 36 32  ALT 12 15* 16*  ALKPHOS 110 88 82  BILITOT 1.83* 1.6* 2.2*  PROT 5.2* 5.2* 4.8*  ALBUMIN 2.1* 2.1* 2.0*   No results for input(s): LIPASE, AMYLASE in the last 168 hours. No results for input(s): AMMONIA in the last 168 hours. Coagulation profile No results for input(s): INR, PROTIME in the last 168 hours.  CBC: Recent Labs  Lab 01/08/2017 1217 12/14/2016 1511 01/11/2017 1536 01/02/17 0942  WBC 8.4 6.7  --   --   NEUTROABS  --  3.5  --   --   HGB 6.8* 6.2* 6.5* 8.2*  HCT 22.0* 20.1* 19.0* 25.4*  MCV 97.0 98.0  --   --   PLT 82* 75*  --   --    Cardiac Enzymes: No results for input(s): CKTOTAL, CKMB, CKMBINDEX, TROPONINI in the last 168 hours. BNP: Invalid input(s): POCBNP CBG: No results for input(s): GLUCAP in the  last 168 hours. D-Dimer No results for input(s): DDIMER in the last 72 hours. Hgb A1c No results for input(s): HGBA1C in the last 72 hours. Lipid Profile No results for input(s): CHOL, HDL, LDLCALC, TRIG, CHOLHDL, LDLDIRECT in the last 72 hours. Thyroid function studies No results for input(s): TSH, T4TOTAL, T3FREE, THYROIDAB in the last 72 hours.  Invalid input(s): FREET3 Anemia work up No results for input(s): VITAMINB12, FOLATE, FERRITIN, TIBC, IRON, RETICCTPCT in the last 72 hours. Microbiology Recent Results (from the past 240 hour(s))  Blood Culture (routine x 2)     Status: None (Preliminary result)   Collection Time: 12/15/2016  3:26 PM  Result Value Ref Range Status   Specimen Description BLOOD LEFT FOREARM  Final   Special Requests   Final    BOTTLES DRAWN AEROBIC AND ANAEROBIC Blood Culture adequate volume   Culture  Setup Time   Final    GRAM POSITIVE COCCI IN BOTH AEROBIC AND ANAEROBIC BOTTLES CRITICAL RESULT CALLED TO, READ BACK BY AND VERIFIED WITH: N GLOGOVAC,PHARMD  AT 1103 01/02/17 BY L BENFIELD Performed at [facility], [place] [address] [place], [place] [address]    Culture GRAM POSITIVE COCCI  Final   Report Status PENDING  Incomplete  Blood Culture ID Panel (Reflexed)     Status: Abnormal   Collection Time: 12/24/2016  3:26 PM  Result Value Ref Range Status   Enterococcus species NOT DETECTED NOT DETECTED Final   Listeria monocytogenes NOT DETECTED NOT DETECTED Final   Staphylococcus species DETECTED (A) NOT DETECTED Final    Comment: Methicillin (oxacillin) resistant coagulase negative staphylococcus. Possible blood culture contaminant (unless isolated from more than one blood culture draw or clinical case suggests pathogenicity). No antibiotic treatment is indicated for blood  culture contaminants. CRITICAL RESULT CALLED TO, READ BACK BY AND VERIFIED WITH: N GLOGOVAC,PHARMD AT 1240 01/02/17 BY L BENFIELD    Staphylococcus aureus NOT DETECTED NOT DETECTED Final   Methicillin resistance DETECTED (A) NOT DETECTED Final    Comment: CRITICAL RESULT CALLED TO, READ BACK BY AND VERIFIED WITH: N GLOGOVAC,PHARMD AT 1240 01/02/17 BY L BENFIELD    Streptococcus species NOT DETECTED NOT DETECTED Final   Streptococcus agalactiae NOT DETECTED NOT DETECTED Final   Streptococcus pneumoniae NOT DETECTED NOT DETECTED Final   Streptococcus pyogenes NOT DETECTED NOT DETECTED Final   Acinetobacter baumannii NOT DETECTED NOT DETECTED Final   Enterobacteriaceae species NOT DETECTED NOT DETECTED Final   Enterobacter cloacae complex NOT DETECTED NOT DETECTED Final   Escherichia coli NOT DETECTED NOT DETECTED Final   Klebsiella oxytoca NOT DETECTED NOT DETECTED Final   Klebsiella pneumoniae NOT DETECTED NOT DETECTED Final   Proteus species NOT DETECTED NOT DETECTED Final   Serratia marcescens NOT DETECTED NOT DETECTED Final   Haemophilus influenzae NOT DETECTED NOT DETECTED Final   Neisseria meningitidis NOT DETECTED NOT DETECTED Final   Pseudomonas  aeruginosa NOT DETECTED NOT DETECTED Final   Candida albicans NOT DETECTED NOT DETECTED Final   Candida glabrata NOT DETECTED NOT DETECTED Final   Candida krusei NOT DETECTED NOT DETECTED Final   Candida parapsilosis NOT DETECTED NOT DETECTED Final   Candida tropicalis NOT DETECTED NOT DETECTED Final    Comment: Performed at [facility], [place]. [address] [place], [place] [address]  Blood Culture (routine x 2)     Status: None (Preliminary result)   Collection Time: 12/15/2016  3:57 PM  Result Value Ref Range Status   Specimen Description BLOOD LEFT FOREARM  Final   Special Requests   Final    BOTTLES DRAWN AEROBIC AND ANAEROBIC Blood Culture adequate volume   Culture   Final    NO GROWTH < 24 HOURS Performed at [facility], [address], [place], [place] [address]    Report Status PENDING  Incomplete  Urine culture     Status: Abnormal   Collection Time: 01/02/17  1:18 AM  Result Value Ref Range Status   Specimen Description URINE, RANDOM  Final   Special Requests NONE  Final   Culture (A)  Final    <10,000 COLONIES/mL INSIGNIFICANT GROWTH Performed at [facility] [address], [place], [place] [address]    Report Status 01/03/2017 FINAL  Final      Studies:  Ct Angio Head W Or Wo Contrast  Result Date: 01/02/2017 CLINICAL DATA:  Stroke follow-up EXAM: CT ANGIOGRAPHY HEAD AND NECK TECHNIQUE: Multidetector CT imaging of the head and neck was performed using the standard protocol during bolus administration of intravenous contrast. Multiplanar CT image reconstructions and MIPs were obtained to evaluate the vascular anatomy. Carotid stenosis measurements (when applicable) are obtained utilizing NASCET criteria, using the distal internal carotid diameter as the denominator. CONTRAST:  59m ISOVUE-370 IOPAMIDOL (ISOVUE-370) INJECTION 76% COMPARISON:  None. FINDINGS: CT HEAD FINDINGS Brain: No mass lesion, intraparenchymal hemorrhage or extra-axial collection. No  evidence of acute cortical infarct. The corpus callosum is absent. There is an old right frontal lobe infarct. Vascular: No hyperdense vessel or unexpected calcification. Skull: Normal visualized skull base, calvarium and extracranial soft tissues. Sinuses/Orbits: There is moderate mucosal thickening of the right maxillary sinus and mild left maxillary mucosal thickening. Fluid level in the right sphenoid sinus. Normal orbits. CTA NECK FINDINGS Aortic arch: There is mild calcific atherosclerosis of the aortic arch. There is no aneurysm, dissection or hemodynamically significant stenosis of the visualized ascending aorta and aortic arch. Conventional 3 vessel aortic branching pattern. The visualized proximal subclavian arteries are normal. Right carotid system: The right common carotid origin is widely patent. There is no common carotid or internal carotid artery dissection or aneurysm. No hemodynamically significant stenosis. Left carotid system: The left common carotid origin is widely patent. There is no common carotid or internal carotid artery dissection or aneurysm. No hemodynamically significant stenosis. Vertebral arteries: The vertebral system is left dominant. Both vertebral artery origins are normal. Both vertebral arteries are normal to their confluence with the basilar artery. Skeleton: There is no bony spinal canal stenosis. No lytic or blastic lesions. Other neck: There are bilateral massively enlarged lymph nodes at all cervical levels, with the largest located at right level 5 AA, measuring up to 5.2 cm. Nodes extend into the upper mediastinum. Upper chest: Small bilateral pleural effusions. Review of the MIP images confirms the above findings CTA HEAD FINDINGS Anterior circulation: --Intracranial internal carotid arteries: Normal. --Anterior cerebral arteries: Normal. --Middle cerebral arteries: Normal. --Posterior communicating arteries: Absent bilaterally. Posterior circulation: --Posterior  cerebral arteries: Normal. --Superior cerebellar arteries: Normal. --Basilar artery: Normal. Venous sinuses: As permitted by contrast timing, patent. Anatomic variants: None Delayed phase: No parenchymal contrast enhancement. Review of the MIP images confirms the above findings IMPRESSION: 1. No emergent large vessel occlusion or high-grade stenosis of the major intracranial and cervical arteries. 2.  Aortic Atherosclerosis (ICD10-I70.0). 3. Massively enlarged bilateral cervical lymph nodes throughout the neck, generally unchanged compared to 11/21/2016 and consistent with the diagnosis of non-Hodgkin's lymphoma. 4. Congenital agenesis of the corpus callosum. Old right frontoparietal  infarct. 5. Small bilateral pleural effusions. Electronically Signed   By: Ulyses Jarred M.D.   On: 01/02/2017 22:18   Dg Chest 2 View  Result Date: 01/02/2017 CLINICAL DATA:  Sepsis, hypotensive EXAM: CHEST  2 VIEW COMPARISON:  12/11/2016, CT 11/27/2016 FINDINGS: Mild cardiomegaly with aortic atherosclerosis. Tiny pleural effusions. Vague opacities are suspected in the right perihilar region and left lung base. No pneumothorax. IMPRESSION: 1. Mild cardiomegaly with tiny pleural effusion 2. Vague right upper lobe/perihilar and left lung base pulmonary opacity, could reflect small foci of infection or pulmonary nodules. Electronically Signed   By: Donavan Foil M.D.   On: 12/17/2016 16:25   Ct Angio Neck W Or Wo Contrast  Result Date: 01/02/2017 CLINICAL DATA:  Stroke follow-up EXAM: CT ANGIOGRAPHY HEAD AND NECK TECHNIQUE: Multidetector CT imaging of the head and neck was performed using the standard protocol during bolus administration of intravenous contrast. Multiplanar CT image reconstructions and MIPs were obtained to evaluate the vascular anatomy. Carotid stenosis measurements (when applicable) are obtained utilizing NASCET criteria, using the distal internal carotid diameter as the denominator. CONTRAST:  39m ISOVUE-370  IOPAMIDOL (ISOVUE-370) INJECTION 76% COMPARISON:  None. FINDINGS: CT HEAD FINDINGS Brain: No mass lesion, intraparenchymal hemorrhage or extra-axial collection. No evidence of acute cortical infarct. The corpus callosum is absent. There is an old right frontal lobe infarct. Vascular: No hyperdense vessel or unexpected calcification. Skull: Normal visualized skull base, calvarium and extracranial soft tissues. Sinuses/Orbits: There is moderate mucosal thickening of the right maxillary sinus and mild left maxillary mucosal thickening. Fluid level in the right sphenoid sinus. Normal orbits. CTA NECK FINDINGS Aortic arch: There is mild calcific atherosclerosis of the aortic arch. There is no aneurysm, dissection or hemodynamically significant stenosis of the visualized ascending aorta and aortic arch. Conventional 3 vessel aortic branching pattern. The visualized proximal subclavian arteries are normal. Right carotid system: The right common carotid origin is widely patent. There is no common carotid or internal carotid artery dissection or aneurysm. No hemodynamically significant stenosis. Left carotid system: The left common carotid origin is widely patent. There is no common carotid or internal carotid artery dissection or aneurysm. No hemodynamically significant stenosis. Vertebral arteries: The vertebral system is left dominant. Both vertebral artery origins are normal. Both vertebral arteries are normal to their confluence with the basilar artery. Skeleton: There is no bony spinal canal stenosis. No lytic or blastic lesions. Other neck: There are bilateral massively enlarged lymph nodes at all cervical levels, with the largest located at right level 5 AA, measuring up to 5.2 cm. Nodes extend into the upper mediastinum. Upper chest: Small bilateral pleural effusions. Review of the MIP images confirms the above findings CTA HEAD FINDINGS Anterior circulation: --Intracranial internal carotid arteries: Normal.  --Anterior cerebral arteries: Normal. --Middle cerebral arteries: Normal. --Posterior communicating arteries: Absent bilaterally. Posterior circulation: --Posterior cerebral arteries: Normal. --Superior cerebellar arteries: Normal. --Basilar artery: Normal. Venous sinuses: As permitted by contrast timing, patent. Anatomic variants: None Delayed phase: No parenchymal contrast enhancement. Review of the MIP images confirms the above findings IMPRESSION: 1. No emergent large vessel occlusion or high-grade stenosis of the major intracranial and cervical arteries. 2.  Aortic Atherosclerosis (ICD10-I70.0). 3. Massively enlarged bilateral cervical lymph nodes throughout the neck, generally unchanged compared to 11/21/2016 and consistent with the diagnosis of non-Hodgkin's lymphoma. 4. 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 5. Small bilateral pleural effusions. Electronically Signed   By: KUlyses JarredM.D.   On: 01/02/2017 22:18  Mr Brain Wo Contrast  Result Date: 12/31/2016 CLINICAL DATA:  Initial evaluation for acute altered mental status. History of min coma. EXAM: MRI HEAD WITHOUT CONTRAST TECHNIQUE: Multiplanar, multiecho pulse sequences of the brain and surrounding structures were obtained without intravenous contrast. COMPARISON:  None. FINDINGS: Brain: Study limited as the patient was unable to tolerate the full length of the exam. Additionally, images provided are markedly degraded by motion artifact. On coronal DWI sequence, there is a 5 mm focus of diffusion abnormality within the left cerebellar hemisphere, suspicious for possible small acute ischemic infarct (series 5, image 9). This is not well seen on axial sequence. No secondary findings to suggest associated hemorrhage or mass effect. No other convincing foci of restricted diffusion to suggest acute or subacute ischemia. Minimal patchy diffusion abnormality overlying the surface/cortical gray matter of the right  parieto-occipital region favored to be artifactual in nature (series 3, image 27). No other acute intracranial process. Agenesis of the corpus callosum again noted. Encephalomalacia out within the right parietal region related to prior hemorrhage. No mass lesion or midline shift. No significant mass effect. No extra-axial fluid collection. Vascular: Major intracranial vascular flow voids are maintained at the skullbase. Skull and upper cervical spine: Craniocervical junction within normal limits. Upper cervical spine normal. Bone marrow signal intensity grossly within normal limits. No scalp soft tissue abnormality. Sinuses/Orbits: The globes and orbital soft tissues within normal limits. Scattered mucosal thickening throughout the paranasal sinuses. Air-fluid levels within the maxillary and right sphenoid sinus. Trace left mastoid effusion. Other: None. IMPRESSION: 1. Limited exam as the patient was unable to tolerate the full length of the study. Additionally, images provided are degraded by motion. 2. Punctate 5 mm focus of diffusion abnormality within the left cerebellar hemisphere, suspicious for small acute ischemic infarct. 3. No other definite acute intracranial abnormality. 4. 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 5. Acute paranasal sinusitis as above. Electronically Signed   By: Jeannine Boga M.D.   On: 12/18/2016 20:57    Assessment: 66 y.o. nursing home resident with atrial fibrillation, prior history of stroke, now with small left cerebellar lesion per brain MRI 01/07/2017, in the setting of mantle cell NHL  (1) normocytic anemia: with reticulocyte>100;though LDH and t bil are both elevated, DAT is negative for both IgG and complement and haptoglobin is WNL, not c/w hemolysis; B-12, folate and ferritin adequate;  consdier bleeding as the cause of his anemia, although guaiacs have been negative and he reports no GI complaints    (a) note this type of NHL  frequently invades GI teract and can cause ulceration  (2)right cervical lymph node biopsy 11/27/2016: shows mantle cell non-Hodgkin's lymphoma;      (a) the Ki67 is high and the morphologyis c/wan aggressive pleomorphic variant (a) prednisone x5d started 12/11/2016, 5-day course,  X2 (b) started rituximab 12/11/2016, repeated weekly 12/07 and 12/13 at lower doses      (i) negative Hep B/C studies 12/03/2016  (3) agenesis of corpus callosum (congenital)  (4) A Fib/ possible cerebellar stroke  Plan:  It has been difficult for [place] to treat Mr Wehner for his Mantle Cell NHL. He has had reactions to the rituximab x2; we are slowly increasing the dose. He would be an excellent candidate for ibrutinib but this can lead to bruising/bleeding and can worsen Afib. For now we are working to increase the rituximab dose and can repeat prednisone next week.  We have been trying to confirm that the meds patient  was started on his last admission (including amiodarone and allopurinol) are being continued at his SNF. We have requested a copy of their MAR but the portions we have received did not have those drugs on the list.  Mantle Cell NHL notoriously involves the GI tract. We had consulted GI in a prior admission for EGD but patient refused at that time. This may give [place] a source for the patient's bleeding if that is indeed the cause of his anemia.  I will see him again 12/24. Please consult my partners for any questions before then       Bobetta Lime, MD 01/03/2017  10:43 AM Medical Oncology and Hematology [facility] [address] [place], [place] [address] Tel. [phone]    Fax. [phone]

## 2017-01-04 DIAGNOSIS — I482 Chronic atrial fibrillation: Secondary | ICD-10-CM

## 2017-01-04 DIAGNOSIS — D696 Thrombocytopenia, unspecified: Secondary | ICD-10-CM

## 2017-01-04 DIAGNOSIS — I272 Pulmonary hypertension, unspecified: Secondary | ICD-10-CM

## 2017-01-04 DIAGNOSIS — I5032 Chronic diastolic (congestive) heart failure: Secondary | ICD-10-CM

## 2017-01-04 DIAGNOSIS — I63442 Cerebral infarction due to embolism of left cerebellar artery: Secondary | ICD-10-CM

## 2017-01-04 LAB — CULTURE, BLOOD (ROUTINE X 2): SPECIAL REQUESTS: ADEQUATE

## 2017-01-04 LAB — COMPREHENSIVE METABOLIC PANEL
ALK PHOS: 71 U/L (ref 38–126)
ALT: 11 U/L — AB (ref 17–63)
AST: 21 U/L (ref 15–41)
Albumin: 1.8 g/dL — ABNORMAL LOW (ref 3.5–5.0)
Anion gap: 4 — ABNORMAL LOW (ref 5–15)
BILIRUBIN TOTAL: 1.8 mg/dL — AB (ref 0.3–1.2)
BUN: 11 mg/dL (ref 6–20)
CALCIUM: 7.5 mg/dL — AB (ref 8.9–10.3)
CO2: 21 mmol/L — AB (ref 22–32)
CREATININE: 0.64 mg/dL (ref 0.61–1.24)
Chloride: 115 mmol/L — ABNORMAL HIGH (ref 101–111)
GFR calc non Af Amer: >60 mL/min (ref >60)
GLUCOSE: 79 mg/dL (ref 65–99)
Potassium: 3.5 mmol/L (ref 3.5–5.1)
SODIUM: 140 mmol/L (ref 135–145)
Total Protein: 4.1 g/dL — ABNORMAL LOW (ref 6.5–8.1)

## 2017-01-04 LAB — CBC
HCT: 24 % — ABNORMAL LOW (ref 39.0–52.0)
Hemoglobin: 7.3 g/dL — ABNORMAL LOW (ref 13.0–17.0)
MCH: 30.3 pg (ref 26.0–34.0)
MCHC: 30.4 g/dL (ref 30.0–36.0)
MCV: 99.6 fL (ref 78.0–100.0)
PLATELETS: 61 10*3/uL — AB (ref 150–400)
RBC: 2.41 MIL/uL — ABNORMAL LOW (ref 4.22–5.81)
RDW: 21.1 % — ABNORMAL HIGH (ref 11.5–15.5)
WBC: 4.9 10*3/uL (ref 4.0–10.5)

## 2017-01-04 LAB — FOLATE: Folate: 14.7 ng/mL (ref >5.9)

## 2017-01-04 LAB — PREPARE RBC (CROSSMATCH)

## 2017-01-04 LAB — RETICULOCYTES
RBC.: 2.69 MIL/uL — ABNORMAL LOW (ref 4.22–5.81)
Retic Count, Absolute: 156 10*3/uL (ref 19.0–186.0)
Retic Ct Pct: 5.8 % — ABNORMAL HIGH (ref 0.4–3.1)

## 2017-01-04 LAB — LACTATE DEHYDROGENASE: LDH: 211 U/L — ABNORMAL HIGH (ref 98–192)

## 2017-01-04 MED ORDER — SODIUM CHLORIDE 0.9 % IV SOLN
Freq: Once | INTRAVENOUS | Status: AC
  Administered 2017-01-04: 09:00:00 via INTRAVENOUS

## 2017-01-04 NOTE — Progress Notes (Signed)
PROGRESS NOTE    [name] [name]  AOZ:[id number] DOB: 1950-10-20 DOA: 01/02/2017 PCP: Wenda Low, MD   Brief Narrative:  66 y.o. 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 Pt also reported he had a fall the day prior in which he hit his head. He was found to be dehydrated, anemic, thrombocytopenic, tachycardic and in acute renal failure.      Subjective: 12/23 A/O 3 (does not know where). Easily confused, gets off subject but easily redirectable, follows all commands. Negative CP, negative SOB, negative abdominal pain negative N/V.     Assessment & Plan:   Active Problems:   Essential hypertension   Hyperlipidemia   Atrial fibrillation with RVR (HCC)   Normocytic anemia   Lymphadenopathy   Mantle cell lymphoma (HCC)   Hypotension   AKI (acute kidney injury) ([place])  Hypotension with near syncope -Most likely multifactorial acute stroke, anemia, orthostatic hypotension, dehydration from chemotherapy.  -Resolved with fluid resuscitation.   Anemia of chronic disease secondary to malignancy and chemotherapy? 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 Jana Hakim oncology; continue  Rituximab   Acute kidney injury  -prerenal in etiology  Recent Labs  Lab 12/29/2016 1217 12/25/2016 1511 12/19/2016 1536 01/02/17 0827 01/04/17 0607  CREATININE 1.0 1.29* 1.20 0.91 0.64  -Resolved-  Thrombocytopenia  -Secondary to stress reaction, malignancy, chemotherapy -Trending down, currently no overt signs of bleeding -If patient has overt signs of bleeding transfuse platelets   HLD  Cont Lipitor   Chronic Diastolic CHF -Strict in and out -Daily weight -Future hemoglobin<   8 -S/P 2U PRBC transfusion  -12/23 Transfuse 1 unit PRBC  Chronic Atrial Fibrillation -Currently in NSR -Rate controlled -Anticoagulation on hold secondary to severe anemia  Pulmonary HTN -See CHF    Right upper extremity swelling chronic  - possibly from lymph node involvement from mantle cell lymphoma.    DVT prophylaxis: SCD Code Status: Full Family Communication: None Disposition Plan: DVD   Consultants:  Oncology Neurology  Procedures/Significant Events:     I have personally reviewed and interpreted all radiology studies and my findings are as above.  VENTILATOR SETTINGS:    Cultures 12/21/2 blood positive coag negative staph (most likely contaminant 12/21 urine positive insignificant growth     Antimicrobials: Antibiotics Given (last 72 hours)    None       Devices    LINES / TUBES:      Continuous Infusions: . sodium chloride 50 mL/hr at 01/04/17 0508     Objective: Vitals:   01/03/17 2100 01/04/17 0050 01/04/17 0445 01/04/17 0845  BP: 130/61 134/62 (!) 127/51 130/63  Pulse: 99 (!) 111 (!) 101 (!) 113  Resp: 20 20 (!) 22 20  Temp: 98.6 F (37 C)  98.6 F (37 C) 98.6 F (37 C) 98.3 F (36.8 C)  TempSrc: Oral Oral Oral   SpO2: 99% 95% 99% 99%  Weight:      Height:        Intake/Output Summary (Last 24 hours) at 01/04/2017 0851 Last data filed at 01/04/2017 0446 Gross per 24 hour  Intake 1208.33 ml  Output 1200 ml  Net 8.33 ml    Filed Weights   12/31/2016 2130  Weight: 186 lb 1.1 oz (84.4 kg)    Examination:  General: A/O 3 (does not need oh where),No acute respiratory distress Eyes: slight left eye ptosis,negative anisocoria, negative icterus, Left eye Xanthelasma  Neck:  Negative scars, masses, torticollis, lymphadenopathy, JVD Lungs: Clear to auscultation bilaterally without wheezes or crackles Cardiovascular: Regular rate and rhythm without murmur gallop or rub normal S1 and S2 Abdomen: morbidly obese, negative abdominal pain, nondistended, positive soft, bowel sounds, no rebound, no ascites, no appreciable mass Extremities: No significant cyanosis, clubbing, or edema bilateral lower extremities Skin: Negative rashes, lesions, ulcers Psychiatric:  Negative depression, negative anxiety, negative fatigue, negative mania  Central nervous system:  Cranial nerves II through XII intact, tongue/uvula midline, all extremities muscle strength 5/5, except LLE muscle strength 4/5, left foot drop,sensation intact throughout, negative dysarthria, negative expressive aphasia, negative receptive aphasia.  .     Data Reviewed: Care during the described time interval was provided by me .  I have reviewed this patient's available data, including medical history, events of note, physical examination, and all test results as part of my evaluation.   CBC: Recent Labs  Lab 01/11/2017 1217 01/03/2017 1511 12/27/2016 1536 01/02/17 0942 01/03/17 1120 01/04/17 0607  WBC 8.4 6.7  --   --   --  4.9  NEUTROABS  --  3.5  --   --   --   --   HGB 6.8* 6.2* 6.5* 8.2* 8.0* 7.3*  HCT 22.0* 20.1* 19.0* 25.4* 26.2* 24.0*  MCV 97.0 98.0  --   --   --  99.6  PLT 82* 75*  --   --   --  61*   Basic Metabolic Panel: Recent Labs  Lab 12/20/2016 1217 12/19/2016 1511 12/14/2016 1536 01/02/17 0827 01/04/17 0607  NA 143 141 142 138 140  K 4.5 4.2 4.2 4.2 3.5  CL  --  111 108 109 115*  CO2 19* 23  --  19* 21*  GLUCOSE 100 103* 100* 146* 79   BUN 21.9 24* 20 26* 11  CREATININE 1.0 1.29* 1.20 0.91 0.64  CALCIUM 8.3* 8.1*  --  7.4* 7.5*   GFR: Estimated Creatinine Clearance: 99.7 mL/min (by C-G formula based on SCr of 0.64 mg/dL). Liver Function Tests: Recent Labs  Lab 12/25/2016 1217 12/28/2016 1511 01/02/17 0827 01/04/17 0607  AST 32 36 32 21  ALT 12 15* 16* 11*  ALKPHOS 110 88 82 71  BILITOT 1.83* 1.6* 2.2* 1.8*  PROT 5.2* 5.2* 4.8* 4.1*  ALBUMIN 2.1* 2.1* 2.0* 1.8*   No results for input(s): LIPASE, AMYLASE in the last 168 hours. No results for input(s): AMMONIA in the last 168 hours. Coagulation Profile: No results for input(s): INR, PROTIME in the last 168 hours. Cardiac Enzymes: No results for input(s): CKTOTAL, CKMB, CKMBINDEX, TROPONINI in the last 168 hours. BNP (last 3 results) No results for input(s): PROBNP in the last 8760 hours. HbA1C: No results for input(s): HGBA1C in the last 72 hours. CBG: No results for input(s): GLUCAP in the last 168 hours.  Lipid Profile: No results for input(s): CHOL, HDL, LDLCALC, TRIG, CHOLHDL, LDLDIRECT in the last 72 hours. Thyroid Function Tests: No results for input(s): TSH, T4TOTAL, FREET4, T3FREE, THYROIDAB in the last 72 hours. Anemia Panel: No results for input(s): VITAMINB12, FOLATE, FERRITIN, TIBC, IRON, RETICCTPCT in the last 72 hours. Urine analysis:    Component Value Date/Time   COLORURINE AMBER (A) 01/02/2017 0117   APPEARANCEUR CLEAR 01/02/2017 0117   LABSPEC 1.021 01/02/2017 0117   PHURINE 5.0 01/02/2017 0117   GLUCOSEU NEGATIVE 01/02/2017 0117   HGBUR NEGATIVE 01/02/2017 0117   BILIRUBINUR NEGATIVE 01/02/2017 0117   KETONESUR 5 (A) 01/02/2017 0117   PROTEINUR 100 (A) 01/02/2017 0117   NITRITE NEGATIVE 01/02/2017 0117   LEUKOCYTESUR NEGATIVE 01/02/2017 0117   Sepsis Labs: @LABRCNTIP (procalcitonin:4,lacticidven:4)  ) Recent Results (from the past 240 hour(s))  Blood Culture (routine x 2)     Status: Abnormal (Preliminary result)   Collection  Time: 12/15/2016  3:26 PM  Result Value Ref Range Status   Specimen Description BLOOD LEFT FOREARM  Final   Special Requests   Final    BOTTLES DRAWN AEROBIC AND ANAEROBIC Blood Culture adequate volume   Culture  Setup Time   Final    GRAM POSITIVE COCCI IN BOTH AEROBIC AND ANAEROBIC BOTTLES CRITICAL RESULT CALLED TO, READ BACK BY AND VERIFIED WITH: N GLOGOVAC,PHARMD AT 1240 01/02/17 BY L BENFIELD Performed at [facility], [place] [address], [place], [place] [address]    Culture STAPHYLOCOCCUS SPECIES (COAGULASE NEGATIVE) (A)  Final   Report Status PENDING  Incomplete  Blood Culture ID Panel (Reflexed)     Status: Abnormal   Collection Time: 01/03/2017  3:26 PM  Result Value Ref Range Status   Enterococcus species NOT DETECTED NOT DETECTED Final   Listeria monocytogenes NOT DETECTED NOT DETECTED Final   Staphylococcus species DETECTED (A) NOT DETECTED Final    Comment: Methicillin (oxacillin) resistant coagulase negative staphylococcus. Possible blood culture contaminant (unless isolated from more than one blood culture draw or clinical case suggests pathogenicity). No antibiotic treatment is indicated for blood  culture contaminants. CRITICAL RESULT CALLED TO, READ BACK BY AND VERIFIED WITH: N GLOGOVAC,PHARMD AT 1240 01/02/17 BY L BENFIELD    Staphylococcus aureus NOT DETECTED NOT DETECTED Final   Methicillin resistance DETECTED (A) NOT DETECTED Final    Comment: CRITICAL RESULT CALLED TO, READ BACK BY AND VERIFIED WITH: N GLOGOVAC,PHARMD AT 1240 01/02/17 BY L BENFIELD    Streptococcus species NOT DETECTED NOT DETECTED Final   Streptococcus agalactiae NOT DETECTED NOT DETECTED Final   Streptococcus pneumoniae NOT DETECTED NOT DETECTED Final   Streptococcus pyogenes NOT DETECTED NOT DETECTED Final   Acinetobacter baumannii NOT DETECTED NOT DETECTED Final   Enterobacteriaceae species NOT DETECTED NOT DETECTED Final   Enterobacter cloacae complex NOT DETECTED NOT DETECTED Final    Escherichia coli NOT DETECTED NOT DETECTED Final   Klebsiella oxytoca NOT DETECTED NOT DETECTED Final   Klebsiella pneumoniae NOT DETECTED NOT DETECTED Final   Proteus species NOT DETECTED NOT DETECTED Final   Serratia marcescens NOT DETECTED NOT DETECTED Final   Haemophilus influenzae NOT DETECTED NOT DETECTED Final   Neisseria meningitidis NOT DETECTED NOT DETECTED Final   Pseudomonas aeruginosa NOT DETECTED NOT DETECTED Final   Candida albicans NOT DETECTED NOT DETECTED Final   Candida glabrata NOT DETECTED NOT DETECTED Final   Candida krusei NOT DETECTED NOT DETECTED Final   Candida parapsilosis NOT DETECTED NOT DETECTED Final   Candida tropicalis NOT DETECTED NOT  DETECTED Final    Comment: Performed at [facility], [place] [address], [place], [place] [address]  Blood Culture (routine x 2)     Status: None (Preliminary result)   Collection Time: 12/27/2016  3:57 PM  Result Value Ref Range Status   Specimen Description BLOOD LEFT FOREARM  Final   Special Requests   Final    BOTTLES DRAWN AEROBIC AND ANAEROBIC Blood Culture adequate volume   Culture   Final    NO GROWTH 2 DAYS Performed at [facility], [place] [address] [place], [place] [address]    Report Status PENDING  Incomplete  Urine culture     Status: Abnormal   Collection Time: 01/02/17  1:18 AM  Result Value Ref Range Status   Specimen Description URINE, RANDOM  Final   Special Requests NONE  Final   Culture (A)  Final    <10,000 COLONIES/mL INSIGNIFICANT GROWTH Performed at [place] [address] [place], [place] [address]    Report Status 01/03/2017 FINAL  Final         Radiology Studies: Ct Angio Head W Or Wo Contrast  Result Date: 01/02/2017 CLINICAL DATA:  Stroke follow-up EXAM: CT ANGIOGRAPHY HEAD AND NECK TECHNIQUE: Multidetector CT imaging of the head and neck was performed using the standard protocol during bolus administration of intravenous contrast. Multiplanar CT image  reconstructions and MIPs were obtained to evaluate the vascular anatomy. Carotid stenosis measurements (when applicable) are obtained utilizing NASCET criteria, using the distal internal carotid diameter as the denominator. CONTRAST:  65mL ISOVUE-370 IOPAMIDOL (ISOVUE-370) INJECTION 76% COMPARISON:  None. FINDINGS: CT HEAD FINDINGS Brain: No mass lesion, intraparenchymal hemorrhage or extra-axial collection. No evidence of acute cortical infarct. The corpus callosum is absent. There is an old right frontal lobe infarct. Vascular: No hyperdense vessel or unexpected calcification. Skull: Normal visualized skull base, calvarium and extracranial soft tissues. Sinuses/Orbits: There is moderate mucosal thickening of the right maxillary sinus and mild left maxillary mucosal thickening. Fluid level in the right sphenoid sinus. Normal orbits. CTA NECK FINDINGS Aortic arch: There is mild calcific atherosclerosis of the aortic arch. There is no aneurysm, dissection or hemodynamically significant stenosis of the visualized ascending aorta and aortic arch. Conventional 3 vessel aortic branching pattern. The visualized proximal subclavian arteries are normal. Right carotid system: The right common carotid origin is widely patent. There is no common carotid or internal carotid artery dissection or aneurysm. No hemodynamically significant stenosis. Left carotid system: The left common carotid origin is widely patent. There is no common carotid or internal carotid artery dissection or aneurysm. No hemodynamically significant stenosis. Vertebral arteries: The vertebral system is left dominant. Both vertebral artery origins are normal. Both vertebral arteries are normal to their confluence with the basilar artery. Skeleton: There is no bony spinal canal stenosis. No lytic or blastic lesions. Other neck: There are bilateral massively enlarged lymph nodes at all cervical levels, with the largest located at right level 5 AA, measuring up  to 5.2 cm. Nodes extend into the upper mediastinum. Upper chest: Small bilateral pleural effusions. Review of the MIP images confirms the above findings CTA HEAD FINDINGS Anterior circulation: --Intracranial internal carotid arteries: Normal. --Anterior cerebral arteries: Normal. --Middle cerebral arteries: Normal. --Posterior communicating arteries: Absent bilaterally. Posterior circulation: --Posterior cerebral arteries: Normal. --Superior cerebellar arteries: Normal. --Basilar artery: Normal. Venous sinuses: As permitted by contrast timing, patent. Anatomic variants: None Delayed phase: No parenchymal contrast enhancement. Review of the MIP images confirms the  above findings IMPRESSION: 1. No emergent large vessel occlusion or high-grade stenosis of the major intracranial and cervical arteries. 2.  Aortic Atherosclerosis (ICD10-I70.0). 3. Massively enlarged bilateral cervical lymph nodes throughout the neck, generally unchanged compared to 11/21/2016 and consistent with the diagnosis of non-Hodgkin's lymphoma. 4. 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 5. Small bilateral pleural effusions. Electronically Signed   By: Ulyses Jarred M.D.   On: 01/02/2017 22:18   Ct Angio Neck W Or Wo Contrast  Result Date: 01/02/2017 CLINICAL DATA:  Stroke follow-up EXAM: CT ANGIOGRAPHY HEAD AND NECK TECHNIQUE: Multidetector CT imaging of the head and neck was performed using the standard protocol during bolus administration of intravenous contrast. Multiplanar CT image reconstructions and MIPs were obtained to evaluate the vascular anatomy. Carotid stenosis measurements (when applicable) are obtained utilizing NASCET criteria, using the distal internal carotid diameter as the denominator. CONTRAST:  52mL ISOVUE-370 IOPAMIDOL (ISOVUE-370) INJECTION 76% COMPARISON:  None. FINDINGS: CT HEAD FINDINGS Brain: No mass lesion, intraparenchymal hemorrhage or extra-axial collection. No evidence of  acute cortical infarct. The corpus callosum is absent. There is an old right frontal lobe infarct. Vascular: No hyperdense vessel or unexpected calcification. Skull: Normal visualized skull base, calvarium and extracranial soft tissues. Sinuses/Orbits: There is moderate mucosal thickening of the right maxillary sinus and mild left maxillary mucosal thickening. Fluid level in the right sphenoid sinus. Normal orbits. CTA NECK FINDINGS Aortic arch: There is mild calcific atherosclerosis of the aortic arch. There is no aneurysm, dissection or hemodynamically significant stenosis of the visualized ascending aorta and aortic arch. Conventional 3 vessel aortic branching pattern. The visualized proximal subclavian arteries are normal. Right carotid system: The right common carotid origin is widely patent. There is no common carotid or internal carotid artery dissection or aneurysm. No hemodynamically significant stenosis. Left carotid system: The left common carotid origin is widely patent. There is no common carotid or internal carotid artery dissection or aneurysm. No hemodynamically significant stenosis. Vertebral arteries: The vertebral system is left dominant. Both vertebral artery origins are normal. Both vertebral arteries are normal to their confluence with the basilar artery. Skeleton: There is no bony spinal canal stenosis. No lytic or blastic lesions. Other neck: There are bilateral massively enlarged lymph nodes at all cervical levels, with the largest located at right level 5 AA, measuring up to 5.2 cm. Nodes extend into the upper mediastinum. Upper chest: Small bilateral pleural effusions. Review of the MIP images confirms the above findings CTA HEAD FINDINGS Anterior circulation: --Intracranial internal carotid arteries: Normal. --Anterior cerebral arteries: Normal. --Middle cerebral arteries: Normal. --Posterior communicating arteries: Absent bilaterally. Posterior circulation: --Posterior cerebral arteries:  Normal. --Superior cerebellar arteries: Normal. --Basilar artery: Normal. Venous sinuses: As permitted by contrast timing, patent. Anatomic variants: None Delayed phase: No parenchymal contrast enhancement. Review of the MIP images confirms the above findings IMPRESSION: 1. No emergent large vessel occlusion or high-grade stenosis of the major intracranial and cervical arteries. 2.  Aortic Atherosclerosis (ICD10-I70.0). 3. Massively enlarged bilateral cervical lymph nodes throughout the neck, generally unchanged compared to 11/21/2016 and consistent with the diagnosis of non-Hodgkin's lymphoma. 4. 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 5. Small bilateral pleural effusions. Electronically Signed   By: Ulyses Jarred M.D.   On: 01/02/2017 22:18        Scheduled Meds: . allopurinol  300 mg Oral Daily  . amiodarone  200 mg Oral Daily  . atorvastatin  20 mg Oral q1800  . Chlorhexidine Gluconate  Cloth  6 each Topical Q0600  . diltiazem  240 mg Oral Daily  . ferrous sulfate  325 mg Oral Q breakfast  . folic acid  1 mg Oral Daily  . metoprolol tartrate  50 mg Oral BID  . mupirocin ointment  1 application Nasal BID  . pantoprazole  40 mg Oral Q0600   Continuous Infusions: . sodium chloride 50 mL/hr at 01/04/17 0508     LOS: 2 days    Time spent: 40 minutes    Floris Neuhaus, Geraldo Docker, MD [facility] Pager [phone]   If 7PM-7AM, please contact night-coverage [email or web address] Password [facility] 01/04/2017, 8:51 AM

## 2017-01-04 NOTE — Progress Notes (Signed)
   01/04/17 1600  Clinical Encounter Type  Visited With Patient;Health care provider  Visit Type Initial  Referral From Nurse  Consult/Referral To Chaplain  Spiritual Encounters  Spiritual Needs Emotional;Prayer  Stress Factors  Patient Stress Factors Health changes   Responded to a [facility] for prayer.  In speaking with nurse, she indicated patient did not have much family.  Visited with the patient who shared some of his medical conditions.  He also shared he does belong to a church and appreciates the community there.  He was pleasant and greatly appreciative of prayer.  We prayed together.  Will follow as needed. Chaplain Katherene Ponto

## 2017-01-04 NOTE — NC FL2 (Signed)
[place] LEVEL OF CARE SCREENING TOOL     IDENTIFICATION  Patient Name: [name] [name] Birthdate: 07/17/1950 Sex: male Admission Date (Current Location): 12/31/2016  [facility] [place] and [place] Number:  Herbalist and Address:  [facility] [place], [place] [address] [place],  [address]      Provider Number: [id number]  Attending Physician Name and Address:  Allie Bossier, MD  Relative Name and Phone Number:       Current Level of Care: Hospital Recommended Level of Care: [place] Prior Approval Number:    Date Approved/Denied: 01/04/17 PASRR Number: [id number]  Discharge Plan: SNF    Current Diagnoses: Patient Active Problem List   Diagnosis Date Noted  . Hypotension 01/03/2017  . AKI (acute kidney injury) ([place]) 01/10/2017  . Sepsis ([place]) 12/11/2016  . Goals of care, counseling/discussion 12/04/2016  . Mantle cell lymphoma ([place]) 12/02/2016  . Atrial fibrillation with RVR ([place]) 11/27/2016  . Normocytic anemia 11/27/2016  . Lymphadenopathy 11/27/2016  . Hyperlipidemia 10/24/2013  . 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 Constipation 08/05/2012  . Hyperglycemia 08/05/2012  . 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 HEMORRHAGE OF RECTUM AND ANUS 05/17/2008  . ABDOMINAL PAIN, LEFT LOWER QUADRANT 05/17/2008  . DYSLIPIDEMIA 04/07/2008  . 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 Essential hypertension 04/07/2008  . DIVERTICULOSIS, MILD 04/07/2008    Orientation RESPIRATION BLADDER Height & Weight     Self, Time, Situation, Place  Normal Continent Weight: 186 lb 1.1 oz (84.4 kg) Height:  6' (182.9 cm)  BEHAVIORAL SYMPTOMS/MOOD NEUROLOGICAL BOWEL NUTRITION STATUS      Incontinent (Please see d/c summary)  AMBULATORY STATUS COMMUNICATION OF NEEDS Skin   Extensive Assist Verbally Normal                       Personal Care Assistance Level of Assistance  Bathing, Feeding, Dressing Bathing Assistance: Maximum  assistance Feeding assistance: Limited assistance Dressing Assistance: Maximum assistance     Functional Limitations Info  Sight, Hearing, Speech Sight Info: Adequate Hearing Info: Adequate Speech Info: Adequate    SPECIAL CARE FACTORS FREQUENCY  OT (By licensed OT), PT (By licensed PT)     PT Frequency: 3x OT Frequency: 3x            Contractures Contractures Info: Not present    Additional Factors Info  Code Status, Allergies, Isolation Precautions Code Status Info: Full Code Allergies Info: No known allergies     Isolation Precautions Info: Contact precautions; MRSA     Current Medications (01/04/2017):  This is the current hospital active medication list Current Facility-Administered Medications  Medication Dose Route Frequency Provider Last Rate Last Dose  . 0.9 %  sodium chloride infusion   Intravenous Continuous [name], MD 50 mL/hr at 01/04/17 [phone]    . 0.9 %  sodium chloride infusion   Intravenous Once Allie Bossier, MD      . acetaminophen (TYLENOL) tablet 500 mg  500 mg Oral Q6H PRN Hosie Poisson, MD   500 mg at 01/02/17 0107  . allopurinol (ZYLOPRIM) tablet 300 mg  300 mg Oral Daily Hosie Poisson, MD   300 mg at 01/04/17 0859  . amiodarone (PACERONE) tablet 200 mg  200 mg Oral Daily Hosie Poisson, MD   200 mg at 01/04/17 0900  . atorvastatin (LIPITOR) tablet 20 mg  20 mg Oral q1800 Hosie Poisson, MD   20 mg at 01/03/17 1921  .  Chlorhexidine Gluconate Cloth 2 % PADS 6 each  6 each Topical Q0600 Hosie Poisson, MD   6 each at 01/04/17 [phone]  . diltiazem (CARDIZEM CD) 24 hr capsule 240 mg  240 mg Oral Daily [name], MD   240 mg at 01/04/17 0901  . ferrous sulfate tablet 325 mg  325 mg Oral Q breakfast Hosie Poisson, MD   325 mg at 01/04/17 0839  . folic acid (FOLVITE) tablet 1 mg  1 mg Oral Daily Hosie Poisson, MD   1 mg at 01/04/17 0857  . metoprolol tartrate (LOPRESSOR) tablet 50 mg  50 mg Oral BID [name], MD   50 mg at  01/04/17 0900  . mupirocin ointment (BACTROBAN) 2 % 1 application  1 application Nasal BID Hosie Poisson, MD   1 application at 68/08/81 2245  . ondansetron (ZOFRAN) tablet 4 mg  4 mg Oral Q6H PRN Hosie Poisson, MD       Or  . ondansetron (ZOFRAN) injection 4 mg  4 mg Intravenous Q6H PRN Hosie Poisson, MD      . pantoprazole (PROTONIX) EC tablet 40 mg  40 mg Oral Q0600 Hosie Poisson, MD   40 mg at 01/03/17 1031   Facility-Administered Medications Ordered in Other Encounters  Medication Dose Route Frequency Provider Last Rate Last Dose  . famotidine (PEPCID) IVPB 20 mg premix  20 mg Intravenous Q12H Harle Stanford., PA-C   20 mg at 12/11/16 1725     Discharge Medications: Please see discharge summary for a list of discharge medications.  Relevant Imaging Results:  Relevant Lab Results:   Additional Information SSN: [id number]  Eileen Stanford, LCSW

## 2017-01-04 NOTE — Progress Notes (Signed)
Physical Therapy Treatment Patient Details Name: [name] [name] MRN: [id number] DOB: 09/02/1950 Today's Date: 01/04/2017    History of Present Illness Patient is a 66 y/o male who presents from cancer ca with hypotension, generalized weakness and dizziness with recent fall at SNF. Hgb 6.6 s/p transfusions. MRI- acute left cerebellar infarct. PMH includes HTN, HLD, CKD, CHF, A-fib, prior CVA in 2008, non-Hodgkin's mantle cell lymphoma     PT Comments    Patient agreeable to lateral scoot transfer EOB to drop arm recliner. Pt required +2 for bed mobility and transfer. Pt is very fearful of falling and declined attempts to stand. Continue to progress as tolerated with anticipated d/c to SNF for further skilled PT services.     Follow Up Recommendations  SNF     Equipment Recommendations  None recommended by PT    Recommendations for Other Services OT consult     Precautions / Restrictions Precautions Precautions: Fall Precaution Comments: Dizziness Restrictions Weight Bearing Restrictions: No    Mobility  Bed Mobility Overal bed mobility: Needs Assistance Bed Mobility: Supine to Sit     Supine to sit: Mod assist;+2 for physical assistance;HOB elevated     General bed mobility comments: cues for sequencing; use of rails; assist to bring bilat LE to EOB and to elevate trunk into sitting  Transfers Overall transfer level: Needs assistance   Transfers: Lateral/Scoot Transfers          Lateral/Scoot Transfers: +2 physical assistance;Mod assist General transfer comment: pt reported dizzines immediately upon sitting and required max encouragement to remain sitting; pt was able to sit EOB with min guard for safety but stated "i'm falling over!" "I'm leaning!" repeatedly; pt agreeable to lateral scoot with +2 assist with chuck pad; cues for hand placement and sequencing  Ambulation/Gait             General Gait Details: pt very fearful of falling and not agreeable to  attempt standing this session   Stairs            Wheelchair Mobility    Modified Rankin (Stroke Patients Only) Modified Rankin (Stroke Patients Only) Pre-Morbid Rankin Score: Moderately severe disability Modified Rankin: Moderately severe disability     Balance Overall balance assessment: Needs assistance Sitting-balance support: Feet supported;Bilateral upper extremity supported Sitting balance-Leahy Scale: Fair         Standing balance comment: Deferred                            Cognition Arousal/Alertness: Awake/alert Behavior During Therapy: WFL for tasks assessed/performed Overall Cognitive Status: No family/caregiver present to determine baseline cognitive functioning                                 General Comments: Tangential with speech, needs redirection to tasks, and very fearful of falling      Exercises      General Comments        Pertinent Vitals/Pain      Home Living                      Prior Function            PT Goals (current goals can now be found in the care plan section) Acute Rehab PT Goals PT Goal Formulation: With patient Time For Goal Achievement: 01/17/17 Potential to Achieve Goals: Fair Progress  towards PT goals: Progressing toward goals    Frequency    Min 3X/week      PT Plan Current plan remains appropriate    Co-evaluation              AM-PAC PT "6 Clicks" Daily Activity  Outcome Measure  Difficulty turning over in bed (including adjusting bedclothes, sheets and blankets)?: Unable Difficulty moving from lying on back to sitting on the side of the bed? : Unable Difficulty sitting down on and standing up from a chair with arms (e.g., wheelchair, bedside commode, etc,.)?: Unable Help needed moving to and from a bed to chair (including a wheelchair)?: A Lot Help needed walking in hospital room?: Total Help needed climbing 3-5 steps with a railing? : Total 6 Click  Score: 7    End of Session Equipment Utilized During Treatment: Gait belt;Oxygen Activity Tolerance: Patient tolerated treatment well Patient left: with call bell/phone within reach;in chair Nurse Communication: Mobility status;Other (comment)(pt up in chair) PT Visit Diagnosis: Dizziness and giddiness (R42);Muscle weakness (generalized) (M62.81);Difficulty in walking, not elsewhere classified (R26.2)     Time: 8916-9450 PT Time Calculation (min) (ACUTE ONLY): 27 min  Charges:  $Therapeutic Activity: 23-37 mins                    G Codes:       Earney Navy, PTA Pager: [phone]     Darliss Cheney 01/04/2017, 1:38 PM

## 2017-01-05 ENCOUNTER — Telehealth: Payer: Self-pay | Admitting: *Deleted

## 2017-01-05 ENCOUNTER — Other Ambulatory Visit: Payer: Self-pay | Admitting: Oncology

## 2017-01-05 LAB — BASIC METABOLIC PANEL
Anion gap: 6 (ref 5–15)
BUN: 11 mg/dL (ref 6–20)
CHLORIDE: 110 mmol/L (ref 101–111)
CO2: 22 mmol/L (ref 22–32)
CREATININE: 0.62 mg/dL (ref 0.61–1.24)
Calcium: 7.7 mg/dL — ABNORMAL LOW (ref 8.9–10.3)
GFR calc Af Amer: >60 mL/min (ref >60)
GFR calc non Af Amer: >60 mL/min (ref >60)
GLUCOSE: 85 mg/dL (ref 65–99)
POTASSIUM: 3.4 mmol/L — AB (ref 3.5–5.1)
Sodium: 138 mmol/L (ref 135–145)

## 2017-01-05 LAB — CBC
HEMATOCRIT: 25.2 % — AB (ref 39.0–52.0)
HEMOGLOBIN: 7.8 g/dL — AB (ref 13.0–17.0)
MCH: 30.2 pg (ref 26.0–34.0)
MCHC: 31 g/dL (ref 30.0–36.0)
MCV: 97.7 fL (ref 78.0–100.0)
Platelets: 47 10*3/uL — ABNORMAL LOW (ref 150–400)
RBC: 2.58 MIL/uL — ABNORMAL LOW (ref 4.22–5.81)
RDW: 20.6 % — ABNORMAL HIGH (ref 11.5–15.5)
WBC: 6 10*3/uL (ref 4.0–10.5)

## 2017-01-05 LAB — IRON AND TIBC
Iron: <5 ug/dL — ABNORMAL LOW (ref 45–182)
TIBC: 175 ug/dL — ABNORMAL LOW (ref 250–450)

## 2017-01-05 LAB — TYPE AND SCREEN
ABO/RH(D): A POS
ANTIBODY SCREEN: NEGATIVE
Unit division: 0

## 2017-01-05 LAB — MAGNESIUM: Magnesium: 1.7 mg/dL (ref 1.7–2.4)

## 2017-01-05 LAB — BPAM RBC
Blood Product Expiration Date: 201901152359
ISSUE DATE / TIME: 201812231428
Unit Type and Rh: 6200

## 2017-01-05 LAB — OCCULT BLOOD X 1 CARD TO LAB, STOOL: FECAL OCCULT BLD: NEGATIVE

## 2017-01-05 LAB — FERRITIN: FERRITIN: 532 ng/mL — AB (ref 24–336)

## 2017-01-05 LAB — VITAMIN B12: Vitamin B-12: 662 pg/mL (ref 180–914)

## 2017-01-05 NOTE — Progress Notes (Signed)
Physical Therapy Treatment Patient Details Name: [name] [name] MRN: [id number] DOB: Jan 25, 1950 Today's Date: 01/05/2017    History of Present Illness Patient is a 66 y/o male who presents from cancer ca with hypotension, generalized weakness and dizziness with recent fall at SNF. Hgb 6.6 s/p transfusions. MRI- acute left cerebellar infarct. PMH includes HTN, HLD, CKD, CHF, A-fib, prior CVA in 2008, non-Hodgkin's mantle cell lymphoma     PT Comments    Pt was seen for ROM to both legs in bed as he declines OOB today based on his statement that he was up from visit at 9 yesterday to midnight and is tired and sore.  His staff do not validate this but pt is agreeing to do there ex to increase strength and ROM both legs.  Will continue to follow him for his needs to shorten stay in rehab after dc from the hospital and to decrease his fall risk.   Follow Up Recommendations  SNF     Equipment Recommendations  None recommended by PT    Recommendations for Other Services       Precautions / Restrictions Precautions Precautions: Fall Precaution Comments: Dizziness Restrictions Weight Bearing Restrictions: No    Mobility  Bed Mobility Overal bed mobility: Needs Assistance Bed Mobility: Supine to Sit Rolling: Min assist Sidelying to sit: Mod assist;HOB elevated          Transfers                 General transfer comment: declined to get OOB  Ambulation/Gait                 Stairs            Wheelchair Mobility    Modified Rankin (Stroke Patients Only)       Balance                                            Cognition Arousal/Alertness: Awake/alert Behavior During Therapy: WFL for tasks assessed/performed Overall Cognitive Status: No family/caregiver present to determine baseline cognitive functioning                                 General Comments: pt stated he was oob for 15 hours in the chair which was not  validated by nursing      Exercises General Exercises - Lower Extremity Ankle Circles/Pumps: AAROM;Both;5 reps Quad Sets: AROM;Both;10 reps Gluteal Sets: AROM;Both;10 reps Heel Slides: AROM;AAROM;Both;20 reps Hip ABduction/ADduction: AROM;AAROM;Both;10 reps    General Comments        Pertinent Vitals/Pain Pain Assessment: Faces Faces Pain Scale: Hurts even more Pain Location: sacral area with pressure Pain Descriptors / Indicators: Sore Pain Intervention(s): Repositioned    Home Living                      Prior Function            PT Goals (current goals can now be found in the care plan section) Progress towards PT goals: Progressing toward goals    Frequency    Min 3X/week      PT Plan Current plan remains appropriate    Co-evaluation              AM-PAC PT "6 Clicks" Daily Activity  Outcome Measure  Difficulty turning over in bed (including adjusting bedclothes, sheets and blankets)?: Unable Difficulty moving from lying on back to sitting on the side of the bed? : Unable Difficulty sitting down on and standing up from a chair with arms (e.g., wheelchair, bedside commode, etc,.)?: Unable Help needed moving to and from a bed to chair (including a wheelchair)?: A Lot Help needed walking in hospital room?: Total Help needed climbing 3-5 steps with a railing? : Total 6 Click Score: 7    End of Session Equipment Utilized During Treatment: Oxygen Activity Tolerance: Patient tolerated treatment well Patient left: in bed;with call bell/phone within reach;with bed alarm set Nurse Communication: Mobility status PT Visit Diagnosis: Dizziness and giddiness (R42);Muscle weakness (generalized) (M62.81);Difficulty in walking, not elsewhere classified (R26.2)     Time: 7505-1833 PT Time Calculation (min) (ACUTE ONLY): 24 min  Charges:  $Therapeutic Exercise: 8-22 mins $Therapeutic Activity: 8-22 mins                    G Codes:  Functional  Assessment Tool Used: AM-PAC 6 Clicks Basic Mobility     Ramond Dial 01/05/2017, 5:32 PM   Mee Hives, PT MS Acute Rehab Dept. Number: [place] and [place]

## 2017-01-05 NOTE — Telephone Encounter (Signed)
This RN attempted to contact [name] [name] to obtain updated MAR for continuity of care - per transfer to pt's hall - phone rang with no answer greater then 4 minutes - RN had to d/c call due to other duties.  Will attempt again on 12/26.

## 2017-01-05 NOTE — Progress Notes (Signed)
PROGRESS NOTE    [name] [name]  PIR:[id number] DOB: 03-03-1950 DOA: 01/02/2017 PCP: Wenda Low, MD   Brief Narrative:  66 y.o. 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 Pt also reported he had a fall the day prior in which he hit his head. He was found to be dehydrated, anemic, thrombocytopenic, tachycardic and in acute renal failure.      Subjective: 01/05/17 A/O 3 (does not know where).  Intermittent confusion, no BM, no fevers no emesis  Assessment & Plan:   Active Problems:   Essential hypertension   Hyperlipidemia   Atrial fibrillation with RVR (HCC)   Normocytic anemia   Lymphadenopathy   Mantle cell lymphoma (HCC)   Hypotension   AKI (acute kidney injury) ([place])  1)Hypotension with near syncope- -Most likely multifactorial acute stroke, anemia, orthostatic hypotension, dehydration from chemotherapy. -Resolved with fluid resuscitation.   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 Jana Hakim oncology; continue Rituximab   5)Acute kidney injury --prerenal in etiology , now resolved Recent Labs  Lab 01/03/2017 1217 12/13/2016 1511 12/28/2016 1536 01/02/17 0827 01/04/17 0607 01/05/17 0438  CREATININE 1.0 1.29* 1.20  0.91 0.64 0.62   6)Thrombocytopenia - -Secondary to malignancy, chemotherapy, Trending down, currently no overt signs of bleeding, transfuse if bleeding or if platelet count less than 10,000  7)HFpEF with pulmonary hypertension-patient has history of Chronic Diastolic CHF, Strict in and out , Daily weight, no evidence of acute exacerbation at this time  8)Chronic Atrial Fibrillation- -Currently in NSR, continue amiodarone 200 mg daily, no anticoagulation due to anemia requiring transfusion   9)Right upper extremity swelling chronic - - possibly from lymph node involvement from mantle cell lymphoma.    DVT prophylaxis: SCD Code Status: Full Family Communication: None Disposition Plan: DVD   Consultants:  Oncology Neurology  Cultures 12/21/2 blood positive coag negative staph (most likely contaminant 12/21 urine positive insignificant growth   Antimicrobials: Antibiotics Given (last 72 hours)    None     Continuous Infusions: . sodium chloride 50 mL/hr at 01/04/17 0508     Objective: Vitals:   01/05/17 0528 01/05/17 1012 01/05/17 1328 01/05/17 1746  BP: (!) 119/55 (!) 122/57 124/65 116/60  Pulse: 81 98 93 88  Resp: 20 20 20 20   Temp: 97.8 F (36.6 C) 98.2 F (36.8 C) 98.2 F (36.8 C) 98.7 F (37.1 C)  TempSrc: Oral Oral Oral Oral  SpO2: 98% 100% 100% 100%  Weight: 92.3 kg (203 lb 7.8 oz)     Height:        Intake/Output Summary (Last 24 hours) at 01/05/2017 1858 Last data filed at 01/05/2017 1700 Gross per 24 hour  Intake 610 ml  Output 1700 ml  Net -1090 ml   Filed Weights   12/18/2016 2130 01/05/17 0528  Weight: 84.4 kg (186 lb 1.1 oz) 92.3 kg (203 lb 7.8 oz)    Examination:  General: A/O 3 (does not need oh where),No acute respiratory distress Eyes: slight left eye ptosis,negative anisocoria, negative icterus, Left eye Xanthelasma  Neck:  Negative scars, masses, torticollis, lymphadenopathy, JVD Lungs: Clear to auscultation bilaterally without  wheezes or crackles Cardiovascular: Regular rate and rhythm without murmur gallop or rub normal S1 and S2 Abdomen: morbidly obese, negative abdominal pain, nondistended, positive soft, bowel sounds, no rebound, no ascites, no appreciable mass Extremities: No significant cyanosis, clubbing, or edema bilateral lower extremities Skin: Negative rashes, lesions, ulcers Psychiatric:  Negative depression, negative anxiety, negative fatigue, negative mania  Central nervous system:  Cranial nerves II through XII intact, tongue/uvula midline, all extremities muscle strength 5/5, except LLE muscle strength 4/5, left foot drop,sensation intact throughout, negative dysarthria, negative expressive aphasia, negative receptive aphasia.  CBC: Recent Labs  Lab 12/25/2016 1217  12/13/2016 1511 12/16/2016 1536 01/02/17 0942 01/03/17 1120 01/04/17 0607 01/05/17 0438  WBC 8.4  --  6.7  --   --   --  4.9 6.0  NEUTROABS  --   --  3.5  --   --   --   --   --   HGB 6.8*   < > 6.2* 6.5* 8.2* 8.0* 7.3* 7.8*  HCT 22.0*   < > 20.1* 19.0* 25.4* 26.2* 24.0* 25.2*  MCV 97.0  --  98.0  --   --   --  99.6 97.7  PLT 82*  --  75*  --   --   --  61* 47*   < > = values in this interval not displayed.   Basic Metabolic Panel: Recent Labs  Lab 12/21/2016 1217 12/28/2016 1511 12/30/2016 1536 01/02/17 0827 01/04/17 0607 01/05/17 0438  NA 143 141 142 138 140 138  K 4.5 4.2 4.2 4.2 3.5 3.4*  CL  --  111 108 109 115* 110  CO2 19* 23  --  19* 21* 22  GLUCOSE 100 103* 100* 146* 79 85  BUN 21.9 24* 20 26* 11 11  CREATININE 1.0 1.29* 1.20 0.91 0.64 0.62  CALCIUM 8.3* 8.1*  --  7.4* 7.5* 7.7*  MG  --   --   --   --   --  1.7   GFR: Estimated Creatinine Clearance: 99.7 mL/min (by C-G formula based on SCr of 0.62 mg/dL). Liver Function Tests: Recent Labs  Lab 01/02/2017 1217 12/26/2016 1511 01/02/17 0827 01/04/17 0607  AST 32 36 32 21  ALT 12 15* 16* 11*  ALKPHOS 110 88 82 71  BILITOT 1.83* 1.6* 2.2* 1.8*  PROT 5.2* 5.2* 4.8*  4.1*  ALBUMIN 2.1* 2.1* 2.0* 1.8*   No results for input(s): LIPASE, AMYLASE in the last 168 hours. No results for input(s): AMMONIA in the last 168 hours. Coagulation Profile: No results for input(s): INR, PROTIME in the last 168 hours. Cardiac Enzymes: No results for input(s): CKTOTAL, CKMB, CKMBINDEX, TROPONINI in the last 168 hours. BNP (last 3 results) No results for input(s): PROBNP in the last 8760 hours. HbA1C: No results for input(s): HGBA1C in the last 72 hours. CBG: No results for input(s): GLUCAP in the last 168 hours. Lipid Profile: No results for input(s): CHOL, HDL, LDLCALC, TRIG, CHOLHDL, LDLDIRECT in the last 72 hours. Thyroid Function Tests: No results for input(s): TSH, T4TOTAL, FREET4, T3FREE, THYROIDAB in the last 72 hours. Anemia Panel: Recent Labs    01/04/17 2129  VITAMINB12 662  FOLATE 14.7  FERRITIN 532*  TIBC 175*  IRON <5*  RETICCTPCT 5.8*   Urine analysis:    Component Value Date/Time   COLORURINE AMBER (A) 01/02/2017 0117   APPEARANCEUR CLEAR 01/02/2017 0117   LABSPEC 1.021 01/02/2017 0117   PHURINE 5.0 01/02/2017 0117   GLUCOSEU NEGATIVE 01/02/2017 0117   HGBUR NEGATIVE 01/02/2017 0117   BILIRUBINUR NEGATIVE 01/02/2017 0117   KETONESUR 5 (A) 01/02/2017 0117   PROTEINUR 100 (A) 01/02/2017 0117   NITRITE NEGATIVE 01/02/2017 0117   LEUKOCYTESUR NEGATIVE 01/02/2017 0117   Sepsis Labs: @LABRCNTIP (procalcitonin:4,lacticidven:4)   Recent Results (from the past 240 hour(s))  Blood Culture (routine x 2)     Status: Abnormal   Collection Time: 12/13/2016  3:26 PM  Result Value Ref Range Status   Specimen Description BLOOD LEFT FOREARM  Final   Special Requests   Final    BOTTLES DRAWN AEROBIC AND ANAEROBIC Blood Culture adequate volume   Culture  Setup Time   Final    GRAM POSITIVE COCCI IN BOTH AEROBIC AND ANAEROBIC BOTTLES CRITICAL RESULT CALLED TO, READ BACK BY AND VERIFIED WITH: N GLOGOVAC,PHARMD AT 6301 01/02/17 BY L BENFIELD     Culture (A)  Final    STAPHYLOCOCCUS SPECIES (COAGULASE NEGATIVE) THE SIGNIFICANCE OF ISOLATING THIS ORGANISM FROM A SINGLE SET OF BLOOD CULTURES WHEN MULTIPLE SETS ARE DRAWN IS UNCERTAIN. PLEASE NOTIFY THE MICROBIOLOGY DEPARTMENT WITHIN ONE WEEK IF SPECIATION AND SENSITIVITIES ARE REQUIRED. Performed at [facility], [place] [address] [place], [place] [address]    Report Status 01/04/2017 FINAL  Final  Blood Culture ID Panel (Reflexed)     Status: Abnormal   Collection Time: 12/14/2016  3:26 PM  Result Value Ref Range Status   Enterococcus species NOT DETECTED NOT DETECTED Final   Listeria monocytogenes NOT DETECTED NOT DETECTED Final   Staphylococcus species DETECTED (A) NOT DETECTED Final    Comment: Methicillin (oxacillin) resistant coagulase negative staphylococcus. Possible blood culture contaminant (unless isolated from more than one blood culture draw or clinical case suggests pathogenicity). No antibiotic treatment is indicated for blood  culture contaminants. CRITICAL RESULT CALLED TO, READ BACK BY AND VERIFIED WITH: N GLOGOVAC,PHARMD AT 1240 01/02/17 BY L BENFIELD    Staphylococcus aureus NOT DETECTED NOT DETECTED Final   Methicillin resistance DETECTED (A) NOT DETECTED Final    Comment: CRITICAL RESULT CALLED TO, READ BACK BY AND VERIFIED WITH: N GLOGOVAC,PHARMD AT 1240 01/02/17 BY L BENFIELD    Streptococcus species NOT DETECTED NOT DETECTED Final   Streptococcus agalactiae NOT DETECTED NOT DETECTED Final   Streptococcus pneumoniae NOT DETECTED NOT DETECTED Final   Streptococcus pyogenes NOT DETECTED NOT DETECTED Final   Acinetobacter baumannii NOT DETECTED NOT DETECTED Final   Enterobacteriaceae species NOT DETECTED NOT DETECTED Final   Enterobacter cloacae complex NOT DETECTED NOT DETECTED Final   Escherichia coli NOT DETECTED NOT DETECTED Final   Klebsiella oxytoca NOT DETECTED NOT DETECTED Final   Klebsiella pneumoniae NOT DETECTED NOT DETECTED Final   Proteus  species NOT DETECTED NOT DETECTED Final   Serratia marcescens NOT DETECTED NOT DETECTED Final   Haemophilus influenzae NOT DETECTED NOT DETECTED Final   Neisseria meningitidis NOT DETECTED NOT DETECTED Final   Pseudomonas aeruginosa NOT DETECTED NOT DETECTED Final   Candida albicans NOT DETECTED NOT DETECTED Final   Candida glabrata NOT DETECTED NOT DETECTED Final   Candida krusei NOT DETECTED NOT DETECTED Final   Candida parapsilosis NOT DETECTED NOT DETECTED Final   Candida tropicalis NOT  DETECTED NOT DETECTED Final    Comment: Performed at [facility], [place] [address] [place], [place] [address]  Blood Culture (routine x 2)     Status: None (Preliminary result)   Collection Time: 01/09/2017  3:57 PM  Result Value Ref Range Status   Specimen Description BLOOD LEFT FOREARM  Final   Special Requests   Final    BOTTLES DRAWN AEROBIC AND ANAEROBIC Blood Culture adequate volume   Culture   Final    NO GROWTH 4 DAYS Performed at [facility], [place] [address] [place], [place] [address]    Report Status PENDING  Incomplete  Urine culture     Status: Abnormal   Collection Time: 01/02/17  1:18 AM  Result Value Ref Range Status   Specimen Description URINE, RANDOM  Final   Special Requests NONE  Final   Culture (A)  Final    <10,000 COLONIES/mL INSIGNIFICANT GROWTH Performed at [facility] [address], [place],  [address]    Report Status 01/03/2017 FINAL  Final     Scheduled Meds: . allopurinol  300 mg Oral Daily  . amiodarone  200 mg Oral Daily  . atorvastatin  20 mg Oral q1800  . Chlorhexidine Gluconate Cloth  6 each Topical Q0600  . diltiazem  240 mg Oral Daily  . ferrous sulfate  325 mg Oral Q breakfast  . folic acid  1 mg Oral Daily  . metoprolol tartrate  50 mg Oral BID  . mupirocin ointment  1 application Nasal BID  . pantoprazole  40 mg Oral Q0600   Continuous Infusions: . sodium chloride 50 mL/hr at 01/04/17 0508     LOS: 3 days    Roxan Hockey, MD [facility] Pager [phone]   If 7PM-7AM, please contact night-coverage [email or web address] Password [facility] 01/05/2017, 6:58 PM

## 2017-01-05 NOTE — Telephone Encounter (Signed)
Oral Oncology Patient Advocate Encounter  Prior Authorization for Kate Sable has been approved.    Effective dates: 01/02/2017 through 01/02/2018  Oral Oncology Clinic will continue to follow.   Fabio Asa. Melynda Keller, [place] Patient [place] [phone] 01/05/2017 8:18 AM

## 2017-01-06 LAB — CBC
HCT: 24.5 % — ABNORMAL LOW (ref 39.0–52.0)
Hemoglobin: 7.6 g/dL — ABNORMAL LOW (ref 13.0–17.0)
MCH: 30.9 pg (ref 26.0–34.0)
MCHC: 31 g/dL (ref 30.0–36.0)
MCV: 99.6 fL (ref 78.0–100.0)
PLATELETS: 42 10*3/uL — AB (ref 150–400)
RBC: 2.46 MIL/uL — ABNORMAL LOW (ref 4.22–5.81)
RDW: 20.1 % — AB (ref 11.5–15.5)
WBC: 5.7 10*3/uL (ref 4.0–10.5)

## 2017-01-06 LAB — CULTURE, BLOOD (ROUTINE X 2)
Culture: NO GROWTH
Special Requests: ADEQUATE

## 2017-01-06 LAB — BASIC METABOLIC PANEL
Anion gap: 5 (ref 5–15)
BUN: 11 mg/dL (ref 6–20)
CALCIUM: 7.7 mg/dL — AB (ref 8.9–10.3)
CO2: 24 mmol/L (ref 22–32)
CREATININE: 0.65 mg/dL (ref 0.61–1.24)
Chloride: 109 mmol/L (ref 101–111)
Glucose, Bld: 82 mg/dL (ref 65–99)
Potassium: 3.4 mmol/L — ABNORMAL LOW (ref 3.5–5.1)
SODIUM: 138 mmol/L (ref 135–145)

## 2017-01-06 LAB — MAGNESIUM: MAGNESIUM: 1.7 mg/dL (ref 1.7–2.4)

## 2017-01-06 MED ORDER — POTASSIUM CHLORIDE CRYS ER 20 MEQ PO TBCR
40.0000 meq | EXTENDED_RELEASE_TABLET | Freq: Once | ORAL | Status: AC
  Administered 2017-01-06: 40 meq via ORAL
  Filled 2017-01-06: qty 2

## 2017-01-06 MED ORDER — DILTIAZEM HCL ER COATED BEADS 180 MG PO CP24
180.0000 mg | ORAL_CAPSULE | Freq: Every day | ORAL | Status: DC
  Administered 2017-01-07 – 2017-01-12 (×6): 180 mg via ORAL
  Filled 2017-01-06 (×7): qty 1

## 2017-01-06 MED ORDER — METOPROLOL TARTRATE 25 MG PO TABS
37.5000 mg | ORAL_TABLET | Freq: Two times a day (BID) | ORAL | Status: DC
  Administered 2017-01-06 – 2017-01-12 (×11): 37.5 mg via ORAL
  Filled 2017-01-06 (×13): qty 1

## 2017-01-06 NOTE — Progress Notes (Signed)
Pt BP 102/55. BP meds due, MD notified. Verbal orders given to hold.

## 2017-01-06 NOTE — Progress Notes (Signed)
PROGRESS NOTE   [name] [name]  ZTI:[id number] DOB: 06/19/1950 DOA: 12/31/2016 PCP: Wenda Low, MD   Brief Narrative:  66 y.o. 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 Pt also reported he had a fall the day prior in which he hit his head. He was found to be dehydrated, anemic, thrombocytopenic, tachycardic and in acute renal failure.     Subjective: A/O 3 (does not know where).  Intermittent confusion,  no fevers no emesis, Friend at bedside visiting, eating ok, stool w/o blood  Assessment & Plan:   Active Problems:   Essential hypertension   Hyperlipidemia   Atrial fibrillation with RVR (HCC)   Normocytic anemia   Lymphadenopathy   Mantle cell lymphoma (HCC)   Hypotension   AKI (acute kidney injury) ([place])  1)Hypotension with near syncope- -Most likely multifactorial acute stroke, anemia, orthostatic hypotension, dehydration from chemotherapy. -Resolved with fluid resuscitation. Cardizem/metoprolol dose adjusted due to BP concerns  2)Anemia of chronic disease secondary to malignancy and chemotherapy?- Repeat occult blood is negative again, S/P 2U PRBC transfusion, s/p transfusion of 1 unit of packed cells on 01/04/2017, hemoglobin is up to 7.7, stool occult blood negative, anemia may be related to underlying malignancy and recent chemo,.  No evidence of ongoing blood loss at this time Per review of EMR refused EGD during recent hospitalization,    Recent Labs  Lab 12/15/2016 1536 01/02/17 0942 01/03/17 1120 01/04/17 0607 01/05/17 0438 01/06/17 0510  HGB 6.5* 8.2* 8.0* 7.3* 7.8* 7.6*    3)Acute Small Infarct Left Cerebral Hemisphere- -Neurology input appreciated, brain MRI with Punctate 5 mm focus of diffusion abnormality within the left cerebellar hemisphere, suspicious for small  acute ischemic infarct.  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 Jana Hakim oncology; on Rituximab   5)Acute kidney injury --prerenal in etiology , now resolved Recent Labs  Lab 12/13/2016 1511 12/28/2016 1536 01/02/17 0827 01/04/17 0607 01/05/17 0438 01/06/17 0510  CREATININE 1.29* 1.20 0.91 0.64 0.62 0.65   6)Thrombocytopenia - -Secondary to malignancy, chemotherapy, Trending down, currently no overt signs of bleeding, transfuse if bleeding or if platelet count less than 10,000  7)HFpEF with pulmonary hypertension-patient has history of Chronic Diastolic CHF, Strict in and out , Daily weight, no evidence of acute exacerbation at this time  8)Chronic Atrial Fibrillation- -Currently in NSR, continue amiodarone 200 mg daily, no anticoagulation due to anemia requiring transfusion, Cardizem dose reduced to 180 mg and metoprolol dose reduced to 37.5 mg bid , adjustments were made due to BP concerns  9)Right upper extremity swelling chronic - - possibly from lymph node involvement from mantle cell lymphoma.   DVT prophylaxis: SCD Code Status: Full Family Communication: None Disposition Plan: DVD   Consultants:  Oncology Neurology  Cultures 12/21/2 blood positive coag negative staph (most likely contaminant 12/21 urine positive insignificant growth   Antimicrobials: Antibiotics Given (last 72 hours)    None     Continuous Infusions: . sodium chloride 50 mL/hr at 01/05/17 2149    Objective: Vitals:   01/06/17 0533 01/06/17 0959 01/06/17 1322 01/06/17 1654  BP: 121/60 (!) 102/55 (!) 106/52 (!) 121/57  Pulse: 74 86 81 (!) 118  Resp: 20 16 20 20   Temp: 99.1 F (37.3 C)  97.8 F (36.6 C) 98 F (36.7 C) 97.9 F (36.6 C)  TempSrc: Oral Oral Oral Oral  SpO2: 99% 96% 100%   Weight: 95.8 kg (211 lb 3.2 oz)     Height:        Intake/Output Summary (Last 24 hours) at  01/06/2017 1728 Last data filed at 01/06/2017 1658 Gross per 24 hour  Intake -  Output 550 ml  Net -550 ml   Filed Weights   12/31/2016 2130 01/05/17 0528 01/06/17 0533  Weight: 84.4 kg (186 lb 1.1 oz) 92.3 kg (203 lb 7.8 oz) 95.8 kg (211 lb 3.2 oz)    Examination:   Physical Exam  Gen:- Awake Alert,  In no apparent distress  Eyes: slight left eye ptosis,negative anisocoria, negative icterus, Left eye Xanthelasma  HEENT:- [place].AT, No sclera icterus Neck-swelling on right side of neck  possibly from lymph node involvement from mantle cell lymphoma. Lungs-diminished in bases, no rales CV- S1, S2 normal Abd-  +ve B.Sounds, Abd Soft, No tenderness,    Extremity/Skin:-Right upper extremity swelling -  possibly from lymph node involvement from mantle cell lymphoma. Neuro-all extremities muscle strength 5/5, except LLE muscle strength 4/5, left foot drop,sensation intact throughout, negative dysarthria, negative expressive aphasia, negative receptive aphasia. Psych-intermittent confusion   CBC: Recent Labs  Lab 01/03/2017 1217 12/22/2016 1511  01/02/17 0942 01/03/17 1120 01/04/17 0607 01/05/17 0438 01/06/17 0510  WBC 8.4 6.7  --   --   --  4.9 6.0 5.7  NEUTROABS  --  3.5  --   --   --   --   --   --   HGB 6.8* 6.2*   < > 8.2* 8.0* 7.3* 7.8* 7.6*  HCT 22.0* 20.1*   < > 25.4* 26.2* 24.0* 25.2* 24.5*  MCV 97.0 98.0  --   --   --  99.6 97.7 99.6  PLT 82* 75*  --   --   --  61* 47* 42*   < > = values in this interval not displayed.   Basic Metabolic Panel: Recent Labs  Lab 12/14/2016 1511 12/22/2016 1536 01/02/17 0827 01/04/17 0607 01/05/17 0438 01/06/17 0510  NA 141 142 138 140 138 138  K 4.2 4.2 4.2 3.5 3.4* 3.4*  CL 111 108 109 115* 110 109  CO2 23  --  19* 21* 22 24  GLUCOSE 103* 100* 146* 79 85 82  BUN 24* 20 26* 11 11 11   CREATININE 1.29* 1.20 0.91 0.64 0.62 0.65  CALCIUM 8.1*  --  7.4* 7.5* 7.7* 7.7*  MG  --   --   --   --  1.7 1.7   GFR: Estimated Creatinine  Clearance: 109.1 mL/min (by C-G formula based on SCr of 0.65 mg/dL). Liver Function Tests: Recent Labs  Lab 01/07/2017 1217 01/11/2017 1511 01/02/17 0827 01/04/17 0607  AST 32 36 32 21  ALT 12 15* 16* 11*  ALKPHOS 110 88 82 71  BILITOT 1.83* 1.6* 2.2* 1.8*  PROT 5.2* 5.2* 4.8* 4.1*  ALBUMIN 2.1* 2.1* 2.0* 1.8*   No results for input(s): LIPASE, AMYLASE in the last 168 hours. No results for input(s): AMMONIA in the last 168 hours. Coagulation Profile: No results for input(s): INR, PROTIME in the last 168 hours. Cardiac Enzymes: No results for input(s): CKTOTAL, CKMB, CKMBINDEX, TROPONINI in the last 168 hours. BNP (last 3 results) No results for input(s): PROBNP in the last 8760 hours. HbA1C: No results for input(s): HGBA1C in the last 72 hours. CBG: No  results for input(s): GLUCAP in the last 168 hours. Lipid Profile: No results for input(s): CHOL, HDL, LDLCALC, TRIG, CHOLHDL, LDLDIRECT in the last 72 hours. Thyroid Function Tests: No results for input(s): TSH, T4TOTAL, FREET4, T3FREE, THYROIDAB in the last 72 hours. Anemia Panel: Recent Labs    01/04/17 2129  VITAMINB12 662  FOLATE 14.7  FERRITIN 532*  TIBC 175*  IRON <5*  RETICCTPCT 5.8*   Urine analysis:    Component Value Date/Time   COLORURINE AMBER (A) 01/02/2017 0117   APPEARANCEUR CLEAR 01/02/2017 0117   LABSPEC 1.021 01/02/2017 0117   PHURINE 5.0 01/02/2017 0117   GLUCOSEU NEGATIVE 01/02/2017 0117   HGBUR NEGATIVE 01/02/2017 0117   BILIRUBINUR NEGATIVE 01/02/2017 0117   KETONESUR 5 (A) 01/02/2017 0117   PROTEINUR 100 (A) 01/02/2017 0117   NITRITE NEGATIVE 01/02/2017 0117   LEUKOCYTESUR NEGATIVE 01/02/2017 0117   Sepsis Labs: @LABRCNTIP (procalcitonin:4,lacticidven:4)   Recent Results (from the past 240 hour(s))  Blood Culture (routine x 2)     Status: Abnormal   Collection Time: 01/04/2017  3:26 PM  Result Value Ref Range Status   Specimen Description BLOOD LEFT FOREARM  Final   Special Requests    Final    BOTTLES DRAWN AEROBIC AND ANAEROBIC Blood Culture adequate volume   Culture  Setup Time   Final    GRAM POSITIVE COCCI IN BOTH AEROBIC AND ANAEROBIC BOTTLES CRITICAL RESULT CALLED TO, READ BACK BY AND VERIFIED WITH: N GLOGOVAC,PHARMD AT 6834 01/02/17 BY L BENFIELD    Culture (A)  Final    STAPHYLOCOCCUS SPECIES (COAGULASE NEGATIVE) THE SIGNIFICANCE OF ISOLATING THIS ORGANISM FROM A SINGLE SET OF BLOOD CULTURES WHEN MULTIPLE SETS ARE DRAWN IS UNCERTAIN. PLEASE NOTIFY THE MICROBIOLOGY DEPARTMENT WITHIN ONE WEEK IF SPECIATION AND SENSITIVITIES ARE REQUIRED. Performed at [facility], [place] [address] [place], [place] [address]    Report Status 01/04/2017 FINAL  Final  Blood Culture ID Panel (Reflexed)     Status: Abnormal   Collection Time: 12/22/2016  3:26 PM  Result Value Ref Range Status   Enterococcus species NOT DETECTED NOT DETECTED Final   Listeria monocytogenes NOT DETECTED NOT DETECTED Final   Staphylococcus species DETECTED (A) NOT DETECTED Final    Comment: Methicillin (oxacillin) resistant coagulase negative staphylococcus. Possible blood culture contaminant (unless isolated from more than one blood culture draw or clinical case suggests pathogenicity). No antibiotic treatment is indicated for blood  culture contaminants. CRITICAL RESULT CALLED TO, READ BACK BY AND VERIFIED WITH: N GLOGOVAC,PHARMD AT 1240 01/02/17 BY L BENFIELD    Staphylococcus aureus NOT DETECTED NOT DETECTED Final   Methicillin resistance DETECTED (A) NOT DETECTED Final    Comment: CRITICAL RESULT CALLED TO, READ BACK BY AND VERIFIED WITH: N GLOGOVAC,PHARMD AT 1240 01/02/17 BY L BENFIELD    Streptococcus species NOT DETECTED NOT DETECTED Final   Streptococcus agalactiae NOT DETECTED NOT DETECTED Final   Streptococcus pneumoniae NOT DETECTED NOT DETECTED Final   Streptococcus pyogenes NOT DETECTED NOT DETECTED Final   Acinetobacter baumannii NOT DETECTED NOT DETECTED Final   Enterobacteriaceae  species NOT DETECTED NOT DETECTED Final   Enterobacter cloacae complex NOT DETECTED NOT DETECTED Final   Escherichia coli NOT DETECTED NOT DETECTED Final   Klebsiella oxytoca NOT DETECTED NOT DETECTED Final   Klebsiella pneumoniae NOT DETECTED NOT DETECTED Final   Proteus species NOT DETECTED NOT DETECTED Final   Serratia marcescens NOT DETECTED NOT DETECTED Final   Haemophilus influenzae NOT DETECTED NOT DETECTED Final   Neisseria  meningitidis NOT DETECTED NOT DETECTED Final   Pseudomonas aeruginosa NOT DETECTED NOT DETECTED Final   Candida albicans NOT DETECTED NOT DETECTED Final   Candida glabrata NOT DETECTED NOT DETECTED Final   Candida krusei NOT DETECTED NOT DETECTED Final   Candida parapsilosis NOT DETECTED NOT DETECTED Final   Candida tropicalis NOT DETECTED NOT DETECTED Final    Comment: Performed at [facility], [place] [address], [place], [place] [address]  Blood Culture (routine x 2)     Status: None   Collection Time: 12/13/2016  3:57 PM  Result Value Ref Range Status   Specimen Description BLOOD LEFT FOREARM  Final   Special Requests   Final    BOTTLES DRAWN AEROBIC AND ANAEROBIC Blood Culture adequate volume   Culture   Final    NO GROWTH 5 DAYS Performed at [facility], [place] [address], [place], [place] [address]    Report Status 01/06/2017 FINAL  Final  Urine culture     Status: Abnormal   Collection Time: 01/02/17  1:18 AM  Result Value Ref Range Status   Specimen Description URINE, RANDOM  Final   Special Requests NONE  Final   Culture (A)  Final    <10,000 COLONIES/mL INSIGNIFICANT GROWTH Performed at [facility], [place] [address] [place], [place] [address]    Report Status 01/03/2017 FINAL  Final     Scheduled Meds: . allopurinol  300 mg Oral Daily  . amiodarone  200 mg Oral Daily  . atorvastatin  20 mg Oral q1800  . Chlorhexidine Gluconate Cloth  6 each Topical Q0600  . [START ON 01/07/2017] diltiazem  180 mg Oral Daily  . ferrous  sulfate  325 mg Oral Q breakfast  . folic acid  1 mg Oral Daily  . metoprolol tartrate  37.5 mg Oral BID  . mupirocin ointment  1 application Nasal BID  . pantoprazole  40 mg Oral Q0600   Continuous Infusions: . sodium chloride 50 mL/hr at 01/05/17 2149    LOS: 4 days   Roxan Hockey, MD [facility] Pager [phone]   If 7PM-7AM, please contact night-coverage [email or web address] Password [facility] 01/06/2017, 5:28 PM

## 2017-01-07 ENCOUNTER — Encounter: Payer: Self-pay | Admitting: General Practice

## 2017-01-07 LAB — BASIC METABOLIC PANEL
ANION GAP: 8 (ref 5–15)
BUN: 11 mg/dL (ref 6–20)
CALCIUM: 7.5 mg/dL — AB (ref 8.9–10.3)
CO2: 20 mmol/L — ABNORMAL LOW (ref 22–32)
Chloride: 108 mmol/L (ref 101–111)
Creatinine, Ser: 0.64 mg/dL (ref 0.61–1.24)
GLUCOSE: 90 mg/dL (ref 65–99)
Potassium: 3.9 mmol/L (ref 3.5–5.1)
SODIUM: 136 mmol/L (ref 135–145)

## 2017-01-07 LAB — CBC
HCT: 24.1 % — ABNORMAL LOW (ref 39.0–52.0)
Hemoglobin: 7.4 g/dL — ABNORMAL LOW (ref 13.0–17.0)
MCH: 30.3 pg (ref 26.0–34.0)
MCHC: 30.7 g/dL (ref 30.0–36.0)
MCV: 98.8 fL (ref 78.0–100.0)
PLATELETS: 50 10*3/uL — AB (ref 150–400)
RBC: 2.44 MIL/uL — AB (ref 4.22–5.81)
RDW: 19.4 % — ABNORMAL HIGH (ref 11.5–15.5)
WBC: 6.5 10*3/uL (ref 4.0–10.5)

## 2017-01-07 LAB — MAGNESIUM: Magnesium: 1.5 mg/dL — ABNORMAL LOW (ref 1.7–2.4)

## 2017-01-07 LAB — OCCULT BLOOD X 1 CARD TO LAB, STOOL: FECAL OCCULT BLD: NEGATIVE

## 2017-01-07 NOTE — Progress Notes (Signed)
Physical Therapy Treatment Patient Details Name: [name] [name] MRN: [id number] DOB: 1950-09-13 Today's Date: 01/07/2017    History of Present Illness Patient is a 66 y/o male who presents from cancer ca with hypotension, generalized weakness and dizziness with recent fall at SNF. Hgb 6.6 s/p transfusions. MRI- acute left cerebellar infarct. PMH includes HTN, HLD, CKD, CHF, A-fib, prior CVA in 2008, non-Hodgkin's mantle cell lymphoma     PT Comments    Pt progresses towards PT goals today, standing at EOB with RW and PT min assist for about 1-minute before c/o fatigue and needing to sit back down. However, motivation/coaxing and mod assist +2 required for sit-to-stand from EOB. Pt stands allowing nurse to change bed sheets and perform peri care after having a bowel movement. Pt is very adamant about not transfering to recliner stating repeatedly, "I'm not getting in that chair" and that "I had a bad experience" claiming he had been "left in the chair until midnight". Pt's HR ranging from 115 - 142 bpm during session. Current discharge plan remains appropriate. PT will follow acutely to maximize mobility while in hospital setting.    Follow Up Recommendations  SNF     Equipment Recommendations  None recommended by PT    Recommendations for Other Services       Precautions / Restrictions Precautions Precautions: Fall Precaution Comments: Dizziness Restrictions Weight Bearing Restrictions: No    Mobility  Bed Mobility Overal bed mobility: Needs Assistance Bed Mobility: Supine to Sit;Rolling;Sit to Supine Rolling: Min assist   Supine to sit: Mod assist;+2 for physical assistance;HOB elevated Sit to supine: Mod assist;+2 for physical assistance   General bed mobility comments: cues to use bed railing for rolling. Pt reaches for therapist during supine to sit, and pt cued to use UEs to elevate trunk into sitting. One assist for trunk control descent and one assist for swinging legs  onto bed during sit to supine.   Transfers Overall transfer level: Needs assistance Equipment used: Rolling walker (2 wheeled) Transfers: Sit to/from Stand Sit to Stand: Mod assist;+2 physical assistance         General transfer comment: Pt requiring great deal of encouragement to stand from EOB. Once standing, pt requiring only min assist +1. Pt refuses ambulation or transfer to chair, so pt back in bed post-session.   Ambulation/Gait                 Stairs            Wheelchair Mobility    Modified Rankin (Stroke Patients Only) Modified Rankin (Stroke Patients Only) Pre-Morbid Rankin Score: Moderately severe disability Modified Rankin: Moderately severe disability     Balance Overall balance assessment: Needs assistance Sitting-balance support: Feet supported;Bilateral upper extremity supported Sitting balance-Leahy Scale: Fair Sitting balance - Comments: Pt able to sit EOB without UE support, however pt anxious about falling and holds on to bed/reaches for railing for safety during sitting EOB.      Standing balance-Leahy Scale: Poor Standing balance comment: Pt utilizes RW and therapist min assist for standing balance. Pt stands for 1-minute and then c/o fatigue, needing to sit back down.                             Cognition Arousal/Alertness: Awake/alert Behavior During Therapy: WFL for tasks assessed/performed;Anxious Overall Cognitive Status: No family/caregiver present to determine baseline cognitive functioning  General Comments: Pt demonstrates anxious behavior and is fearful of falling. Pt perseverates on not getting into the chair, repeatedly stating "i'm not getting in that chair". Requiring redirecting throughout session.       Exercises      General Comments General comments (skin integrity, edema, etc.): Pt's HR ranging from 115 - 142 bpm during session.       Pertinent  Vitals/Pain Pain Assessment: No/denies pain    Home Living                      Prior Function            PT Goals (current goals can now be found in the care plan section) Progress towards PT goals: Progressing toward goals    Frequency    Min 3X/week      PT Plan Current plan remains appropriate    Co-evaluation              AM-PAC PT "6 Clicks" Daily Activity  Outcome Measure  Difficulty turning over in bed (including adjusting bedclothes, sheets and blankets)?: Unable Difficulty moving from lying on back to sitting on the side of the bed? : Unable Difficulty sitting down on and standing up from a chair with arms (e.g., wheelchair, bedside commode, etc,.)?: Unable Help needed moving to and from a bed to chair (including a wheelchair)?: A Lot Help needed walking in hospital room?: A Lot Help needed climbing 3-5 steps with a railing? : A Lot 6 Click Score: 9    End of Session Equipment Utilized During Treatment: Oxygen;Gait belt Activity Tolerance: Patient limited by fatigue(pt limited by anxiety) Patient left: in bed;with call bell/phone within reach;with nursing/sitter in room Nurse Communication: Mobility status PT Visit Diagnosis: Dizziness and giddiness (R42);Muscle weakness (generalized) (M62.81);Difficulty in walking, not elsewhere classified (R26.2)     Time: 5102-5852 PT Time Calculation (min) (ACUTE ONLY): 23 min  Charges:  $Therapeutic Activity: 23-37 mins                    G Codes:       [name] [name], SPT   [name] [name] 01/07/2017, 12:50 PM

## 2017-01-07 NOTE — Progress Notes (Signed)
PROGRESS NOTE   [name] [name]  KGM:[id number] DOB: Nov 15, 1950 DOA: 01/09/2017 PCP: Wenda Low, MD   Brief Narrative:  66 y.o. 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 Pt also reported he had a fall the day prior in which he hit his head. He was found to be dehydrated, anemic, thrombocytopenic, tachycardic and in acute renal failure.      Subjective: A/O 3 .  Intermittent confusion,  no fevers no emesis, eating ok, stool w/o blood, no abdominal pain no chest pains  Assessment & Plan:   Active Problems:   Essential hypertension   Hyperlipidemia   Atrial fibrillation with RVR (HCC)   Normocytic anemia   Lymphadenopathy   Mantle cell lymphoma (HCC)   Hypotension   AKI (acute kidney injury) ([place])  1)Hypotension with near syncope- -Most likely multifactorial acute stroke, anemia, orthostatic hypotension, dehydration from chemotherapy. -Resolved with fluid resuscitation. Cardizem/metoprolol dose adjusted due to BP concerns  2)Anemia of chronic disease secondary to malignancy and chemotherapy?- Repeat occult blood is negative again, S/P 2U PRBC transfusion previously, s/p transfusion of 1 unit of packed cells on 01/04/2017, hemoglobin is 7.4,  , anemia may be related to underlying malignancy and recent chemo,.  No evidence of ongoing blood loss at this time, platelet count is up to 50,000 Per review of EMR refused EGD during recent hospitalization,    Recent Labs  Lab 01/02/17 0942 01/03/17 1120 01/04/17 0607 01/05/17 0438 01/06/17 0510 01/07/17 0409  HGB 8.2* 8.0* 7.3* 7.8* 7.6* 7.4*    3)Acute Small Infarct Left Cerebral Hemisphere- -Neurology input appreciated, brain MRI with Punctate 5 mm focus of diffusion abnormality within the left cerebellar hemisphere, suspicious for small  acute ischemic infarct.  CTA head and CTA neck without acute findings, continue Lipitor, avoid antiplatelet agents due to thrombocytopenia and anemia requiring transfusion  4) Non-Hodgkin's mantle cell lymphoma on the right side of the neck- Further management as per Dr. Jana Hakim oncology; on Rituximab   5)Acute kidney injury --prerenal in etiology , now resolved Recent Labs  Lab 12/20/2016 1536 01/02/17 0827 01/04/17 0607 01/05/17 0438 01/06/17 0510 01/07/17 0409  CREATININE 1.20 0.91 0.64 0.62 0.65 0.64   6)Thrombocytopenia - -Secondary to malignancy, chemotherapy, , currently no overt signs of bleeding, transfuse if bleeding or if platelet count less than 10,000, Platelet 50 k  7)HFpEF with pulmonary hypertension-patient has history of Chronic Diastolic CHF, Strict in and out , Daily weight, no evidence of acute exacerbation at this time, be judicious with IV fluids to avoid volume overload  8)Chronic Atrial Fibrillation- -Currently in NSR, continue amiodarone 200 mg daily, no anticoagulation due to anemia requiring transfusion, continue Cardizem  180 mg and metoprolol 37.5 mg bid   9)Right upper extremity swelling chronic - - possibly from lymph node involvement from mantle cell lymphoma.   DVT prophylaxis: SCD (low platelet) Code Status: Full Family Communication: None Disposition Plan: DVD    Consultants:  Oncology Neurology  Cultures 12/21/2 blood positive coag negative staph (most likely contaminant 12/21 urine positive insignificant growth   Antimicrobials: Antibiotics Given (last 72 hours)    None     Continuous Infusions: . sodium chloride 50 mL/hr at 01/06/17 1900    Objective: Vitals:   01/07/17 0016 01/07/17 0625 01/07/17 1003 01/07/17 1342  BP: (!) 121/56 127/67 125/73 (!) 107/51  Pulse:  91 97 (!) 105 92  Resp: 20 (!) 22 17 18   Temp: 97.8 F (36.6 C) 98.4 F (36.9 C) 98.1 F (36.7 C) 98.2 F (36.8 C)  TempSrc: Oral Oral Oral Oral  SpO2: 99% 98%  99% 100%  Weight:      Height:        Intake/Output Summary (Last 24 hours) at 01/07/2017 1806 Last data filed at 01/07/2017 1660 Gross per 24 hour  Intake 600 ml  Output 1100 ml  Net -500 ml   Filed Weights   12/21/2016 2130 01/05/17 0528 01/06/17 0533  Weight: 84.4 kg (186 lb 1.1 oz) 92.3 kg (203 lb 7.8 oz) 95.8 kg (211 lb 3.2 oz)    Examination:   Physical Exam  Gen:- Awake Alert,  In no apparent distress  Eyes: slight left eye ptosis,negative anisocoria, negative icterus, Left eye Xanthelasma  HEENT:- [place].AT, No sclera icterus Neck-swelling on right side of neck  possibly from lymph node involvement from mantle cell lymphoma. Lungs-diminished in bases, no rales CV- S1, S2 normal Abd-  +ve B.Sounds, Abd Soft, No tenderness,    Extremity/Skin:-Right upper extremity swelling -  possibly from lymph node involvement from mantle cell lymphoma. Neuro-all extremities muscle strength 5/5, except LLE muscle strength 4/5, left foot drop,sensation intact throughout, negative dysarthria, negative expressive aphasia, negative receptive aphasia. Psych-intermittent confusion   CBC: Recent Labs  Lab 01/08/2017 1511  01/03/17 1120 01/04/17 0607 01/05/17 0438 01/06/17 0510 01/07/17 0409  WBC 6.7  --   --  4.9 6.0 5.7 6.5  NEUTROABS 3.5  --   --   --   --   --   --   HGB 6.2*   < > 8.0* 7.3* 7.8* 7.6* 7.4*  HCT 20.1*   < > 26.2* 24.0* 25.2* 24.5* 24.1*  MCV 98.0  --   --  99.6 97.7 99.6 98.8  PLT 75*  --   --  61* 47* 42* 50*   < > = values in this interval not displayed.   Basic Metabolic Panel: Recent Labs  Lab 01/02/17 0827 01/04/17 0607 01/05/17 0438 01/06/17 0510 01/07/17 0409  NA 138 140 138 138 136  K 4.2 3.5 3.4* 3.4* 3.9  CL 109 115* 110 109 108  CO2 19* 21* 22 24 20*  GLUCOSE 146* 79 85 82 90  BUN 26* 11 11 11 11   CREATININE 0.91 0.64 0.62 0.65 0.64  CALCIUM 7.4* 7.5* 7.7* 7.7* 7.5*  MG  --   --  1.7 1.7 1.5*   GFR: Estimated Creatinine Clearance: 109.1  mL/min (by C-G formula based on SCr of 0.64 mg/dL). Liver Function Tests: Recent Labs  Lab 01/11/2017 1217 01/12/2017 1511 01/02/17 0827 01/04/17 0607  AST 32 36 32 21  ALT 12 15* 16* 11*  ALKPHOS 110 88 82 71  BILITOT 1.83* 1.6* 2.2* 1.8*  PROT 5.2* 5.2* 4.8* 4.1*  ALBUMIN 2.1* 2.1* 2.0* 1.8*   No results for input(s): LIPASE, AMYLASE in the last 168 hours. No results for input(s): AMMONIA in the last 168 hours. Coagulation Profile: No results for input(s): INR, PROTIME in the last 168 hours. Cardiac Enzymes: No results for input(s): CKTOTAL, CKMB, CKMBINDEX, TROPONINI in the last 168 hours. BNP (last 3 results) No results for input(s): PROBNP in the last 8760 hours. HbA1C: No results for input(s): HGBA1C in the last 72 hours. CBG: No results for input(s): GLUCAP in the last 168 hours. Lipid Profile: No results for input(s): CHOL, HDL, LDLCALC, TRIG, CHOLHDL, LDLDIRECT  in the last 72 hours. Thyroid Function Tests: No results for input(s): TSH, T4TOTAL, FREET4, T3FREE, THYROIDAB in the last 72 hours. Anemia Panel: Recent Labs    01/04/17 2129  VITAMINB12 662  FOLATE 14.7  FERRITIN 532*  TIBC 175*  IRON <5*  RETICCTPCT 5.8*   Urine analysis:    Component Value Date/Time   COLORURINE AMBER (A) 01/02/2017 0117   APPEARANCEUR CLEAR 01/02/2017 0117   LABSPEC 1.021 01/02/2017 0117   PHURINE 5.0 01/02/2017 0117   GLUCOSEU NEGATIVE 01/02/2017 0117   HGBUR NEGATIVE 01/02/2017 0117   BILIRUBINUR NEGATIVE 01/02/2017 0117   KETONESUR 5 (A) 01/02/2017 0117   PROTEINUR 100 (A) 01/02/2017 0117   NITRITE NEGATIVE 01/02/2017 0117   LEUKOCYTESUR NEGATIVE 01/02/2017 0117   Sepsis Labs: @LABRCNTIP (procalcitonin:4,lacticidven:4)   Recent Results (from the past 240 hour(s))  Blood Culture (routine x 2)     Status: Abnormal   Collection Time: 12/21/2016  3:26 PM  Result Value Ref Range Status   Specimen Description BLOOD LEFT FOREARM  Final   Special Requests   Final    BOTTLES  DRAWN AEROBIC AND ANAEROBIC Blood Culture adequate volume   Culture  Setup Time   Final    GRAM POSITIVE COCCI IN BOTH AEROBIC AND ANAEROBIC BOTTLES CRITICAL RESULT CALLED TO, READ BACK BY AND VERIFIED WITH: N GLOGOVAC,PHARMD AT 0981 01/02/17 BY L BENFIELD    Culture (A)  Final    STAPHYLOCOCCUS SPECIES (COAGULASE NEGATIVE) THE SIGNIFICANCE OF ISOLATING THIS ORGANISM FROM A SINGLE SET OF BLOOD CULTURES WHEN MULTIPLE SETS ARE DRAWN IS UNCERTAIN. PLEASE NOTIFY THE MICROBIOLOGY DEPARTMENT WITHIN ONE WEEK IF SPECIATION AND SENSITIVITIES ARE REQUIRED. Performed at [facility], [place] [address], [place], [place] [address]    Report Status 01/04/2017 FINAL  Final  Blood Culture ID Panel (Reflexed)     Status: Abnormal   Collection Time: 01/09/2017  3:26 PM  Result Value Ref Range Status   Enterococcus species NOT DETECTED NOT DETECTED Final   Listeria monocytogenes NOT DETECTED NOT DETECTED Final   Staphylococcus species DETECTED (A) NOT DETECTED Final    Comment: Methicillin (oxacillin) resistant coagulase negative staphylococcus. Possible blood culture contaminant (unless isolated from more than one blood culture draw or clinical case suggests pathogenicity). No antibiotic treatment is indicated for blood  culture contaminants. CRITICAL RESULT CALLED TO, READ BACK BY AND VERIFIED WITH: N GLOGOVAC,PHARMD AT 1240 01/02/17 BY L BENFIELD    Staphylococcus aureus NOT DETECTED NOT DETECTED Final   Methicillin resistance DETECTED (A) NOT DETECTED Final    Comment: CRITICAL RESULT CALLED TO, READ BACK BY AND VERIFIED WITH: N GLOGOVAC,PHARMD AT 1240 01/02/17 BY L BENFIELD    Streptococcus species NOT DETECTED NOT DETECTED Final   Streptococcus agalactiae NOT DETECTED NOT DETECTED Final   Streptococcus pneumoniae NOT DETECTED NOT DETECTED Final   Streptococcus pyogenes NOT DETECTED NOT DETECTED Final   Acinetobacter baumannii NOT DETECTED NOT DETECTED Final   Enterobacteriaceae species NOT  DETECTED NOT DETECTED Final   Enterobacter cloacae complex NOT DETECTED NOT DETECTED Final   Escherichia coli NOT DETECTED NOT DETECTED Final   Klebsiella oxytoca NOT DETECTED NOT DETECTED Final   Klebsiella pneumoniae NOT DETECTED NOT DETECTED Final   Proteus species NOT DETECTED NOT DETECTED Final   Serratia marcescens NOT DETECTED NOT DETECTED Final   Haemophilus influenzae NOT DETECTED NOT DETECTED Final   Neisseria meningitidis NOT DETECTED NOT DETECTED Final   Pseudomonas aeruginosa NOT DETECTED NOT DETECTED Final   Candida albicans NOT DETECTED  NOT DETECTED Final   Candida glabrata NOT DETECTED NOT DETECTED Final   Candida krusei NOT DETECTED NOT DETECTED Final   Candida parapsilosis NOT DETECTED NOT DETECTED Final   Candida tropicalis NOT DETECTED NOT DETECTED Final    Comment: Performed at [facility], [place] [address] [place], [place] [address]  Blood Culture (routine x 2)     Status: None   Collection Time: 01/04/2017  3:57 PM  Result Value Ref Range Status   Specimen Description BLOOD LEFT FOREARM  Final   Special Requests   Final    BOTTLES DRAWN AEROBIC AND ANAEROBIC Blood Culture adequate volume   Culture   Final    NO GROWTH 5 DAYS Performed at [facility], [place] [address] [place], [place] [address]    Report Status 01/06/2017 FINAL  Final  Urine culture     Status: Abnormal   Collection Time: 01/02/17  1:18 AM  Result Value Ref Range Status   Specimen Description URINE, RANDOM  Final   Special Requests NONE  Final   Culture (A)  Final    <10,000 COLONIES/mL INSIGNIFICANT GROWTH Performed at [facility], [place] [address], [place], [place] [address]    Report Status 01/03/2017 FINAL  Final     Scheduled Meds: . allopurinol  300 mg Oral Daily  . amiodarone  200 mg Oral Daily  . atorvastatin  20 mg Oral q1800  . diltiazem  180 mg Oral Daily  . ferrous sulfate  325 mg Oral Q breakfast  . folic acid  1 mg Oral Daily  . metoprolol tartrate  37.5  mg Oral BID  . pantoprazole  40 mg Oral Q0600   Continuous Infusions: . sodium chloride 50 mL/hr at 01/06/17 1900    LOS: 5 days   Roxan Hockey, MD [facility]  If 7PM-7AM, please contact night-coverage [email or web address] Password [facility] 01/07/2017, 6:06 PM

## 2017-01-07 NOTE — Progress Notes (Signed)
[facility] CSW Progress Note  MAR received from [facility], provided to Dr Magrinat's nurse for review.  Edwyna Shell, LCSW Clinical Social Worker Phone:  [phone]

## 2017-01-07 NOTE — Progress Notes (Signed)
CSW received call from [place] at [facility]. [name] sought information on the status of pt's care at this time. CSW provided [name] with what information was in chart. CSW suggested that she speak with unit RN's for further knowledge on pt's care. [name] declined this.    [name] Hyman Crossan, MSW, [facility] Clinical Social Worker [phone]

## 2017-01-08 ENCOUNTER — Ambulatory Visit: Payer: Medicare Other

## 2017-01-08 ENCOUNTER — Other Ambulatory Visit: Payer: Medicare Other

## 2017-01-08 ENCOUNTER — Telehealth: Payer: Self-pay | Admitting: Pharmacist

## 2017-01-08 ENCOUNTER — Ambulatory Visit: Payer: Medicare Other | Admitting: Adult Health

## 2017-01-08 DIAGNOSIS — E861 Hypovolemia: Secondary | ICD-10-CM

## 2017-01-08 DIAGNOSIS — I9589 Other hypotension: Secondary | ICD-10-CM

## 2017-01-08 DIAGNOSIS — C8580 Other specified types of non-Hodgkin lymphoma, unspecified site: Secondary | ICD-10-CM

## 2017-01-08 DIAGNOSIS — I63512 Cerebral infarction due to unspecified occlusion or stenosis of left middle cerebral artery: Secondary | ICD-10-CM

## 2017-01-08 DIAGNOSIS — D638 Anemia in other chronic diseases classified elsewhere: Secondary | ICD-10-CM

## 2017-01-08 LAB — MAGNESIUM: Magnesium: 1.6 mg/dL — ABNORMAL LOW (ref 1.7–2.4)

## 2017-01-08 LAB — CBC
HCT: 23.8 % — ABNORMAL LOW (ref 39.0–52.0)
Hemoglobin: 7.3 g/dL — ABNORMAL LOW (ref 13.0–17.0)
MCH: 30.3 pg (ref 26.0–34.0)
MCHC: 30.7 g/dL (ref 30.0–36.0)
MCV: 98.8 fL (ref 78.0–100.0)
PLATELETS: 48 10*3/uL — AB (ref 150–400)
RBC: 2.41 MIL/uL — AB (ref 4.22–5.81)
RDW: 19.2 % — AB (ref 11.5–15.5)
WBC: 6.3 10*3/uL (ref 4.0–10.5)

## 2017-01-08 LAB — BASIC METABOLIC PANEL
ANION GAP: 5 (ref 5–15)
BUN: 10 mg/dL (ref 6–20)
CALCIUM: 7.9 mg/dL — AB (ref 8.9–10.3)
CO2: 24 mmol/L (ref 22–32)
Chloride: 109 mmol/L (ref 101–111)
Creatinine, Ser: 0.54 mg/dL — ABNORMAL LOW (ref 0.61–1.24)
GFR calc Af Amer: >60 mL/min (ref >60)
GLUCOSE: 103 mg/dL — AB (ref 65–99)
Potassium: 3.9 mmol/L (ref 3.5–5.1)
Sodium: 138 mmol/L (ref 135–145)

## 2017-01-08 LAB — PREPARE RBC (CROSSMATCH)

## 2017-01-08 MED ORDER — SODIUM CHLORIDE 0.9 % IV SOLN
Freq: Once | INTRAVENOUS | Status: AC
  Administered 2017-01-08: 10:00:00 via INTRAVENOUS

## 2017-01-08 MED ORDER — MAGNESIUM SULFATE 50 % IJ SOLN
3.0000 g | Freq: Once | INTRAVENOUS | Status: AC
  Administered 2017-01-08: 3 g via INTRAVENOUS
  Filled 2017-01-08: qty 6

## 2017-01-08 NOTE — Progress Notes (Signed)
Physical Therapy Treatment Patient Details Name: [name] [name] MRN: [id number] DOB: 1950-02-28 Today's Date: 01/08/2017    History of Present Illness Patient is a 66 y/o male who presents from cancer ca with hypotension, generalized weakness and dizziness with recent fall at SNF. Hgb 6.6 s/p transfusions. MRI- acute left cerebellar infarct. PMH includes HTN, HLD, CKD, CHF, A-fib, prior CVA in 2008, non-Hodgkin's mantle cell lymphoma     PT Comments    Pt presents today with increased swelling of LLE as compared to RLE. Educated pt on ankle pumps to reduce swelling, however very likely that pt will require reminders throughout the day. Pt progressed towards PT goals today, demonstrating 2x sit-to-stand (~1 minute each time) with modA +2 and VCs. Pt continues to demonstrate high anxiety regarding falling when sitting at EOB or standing and requires constant encouragement/redirection in order to perform activities. Pt's HR ranging from 109-137 bpm during session. Pt with bowel movement during session, cleaned by therapy while in standing position at EOB. Current discharge plan remains appropriate. PT will follow acutely in order to maximize mobilitiy while in hospital setting.     Follow Up Recommendations  SNF     Equipment Recommendations  None recommended by PT    Recommendations for Other Services       Precautions / Restrictions Precautions Precautions: Fall Precaution Comments: Dizziness Restrictions Weight Bearing Restrictions: No    Mobility  Bed Mobility Overal bed mobility: Needs Assistance Bed Mobility: Rolling;Sidelying to Sit;Sit to Supine Rolling: Min assist Sidelying to sit: Mod assist;HOB elevated   Sit to supine: Mod assist;+2 for physical assistance;HOB elevated   General bed mobility comments: VCs to reach for handrailing for rolling. Pt utilizes therapist to help pull himself up to sit from sidelying. VCs to swing legs off side of bed.  Transfers Overall  transfer level: Needs assistance Equipment used: Rolling walker (2 wheeled) Transfers: Sit to/from Stand Sit to Stand: Mod assist;+2 physical assistance         General transfer comment: STS from EOB x2. Pt stands for ~ 1 minute each time. VCs to stand up straight and look forward. RLE shaky throughout standing. Pt reporting "we need one more person to stabilize the back" even though therapist and tech sufficient. Pt continues to demonstrate anxiety about standing and says "hold on, hold on" even when just sitting EOB. With VCs, pt able to take 6 very small lateral steps toward EOB to the left. Pt drags feet to maintain contact with the floor.   Ambulation/Gait                 Stairs            Wheelchair Mobility    Modified Rankin (Stroke Patients Only) Modified Rankin (Stroke Patients Only) Pre-Morbid Rankin Score: Moderately severe disability Modified Rankin: Moderately severe disability     Balance Overall balance assessment: Needs assistance Sitting-balance support: Feet supported;Bilateral upper extremity supported Sitting balance-Leahy Scale: Fair Sitting balance - Comments: Pt able to sit EOB without UE support, however pt anxious about falling and holds on to bed for safety during sitting EOB. Pt prefers to have one hand on him for external support at all times.     Standing balance-Leahy Scale: Poor Standing balance comment: Pt utilizes RW and therapist min assist +2 for standing balance. VCs required for upright posture.  Cognition Arousal/Alertness: Awake/alert Behavior During Therapy: WFL for tasks assessed/performed;Anxious Overall Cognitive Status: No family/caregiver present to determine baseline cognitive functioning                                 General Comments: Pt is highly anxious throughout session. Pt requires constant redirecting and assurance that he is okay. Pt fearful of falling.        Exercises General Exercises - Lower Extremity Ankle Circles/Pumps: AROM;10 reps;Both;Supine    General Comments General comments (skin integrity, edema, etc.): Pt's HR ranging from 109-137 bpm during session. Increased swelling of LLE as compared to RLE.       Pertinent Vitals/Pain Pain Assessment: No/denies pain    Home Living                      Prior Function            PT Goals (current goals can now be found in the care plan section) Progress towards PT goals: Progressing toward goals    Frequency    Min 3X/week      PT Plan Current plan remains appropriate    Co-evaluation              AM-PAC PT "6 Clicks" Daily Activity  Outcome Measure  Difficulty turning over in bed (including adjusting bedclothes, sheets and blankets)?: Unable Difficulty moving from lying on back to sitting on the side of the bed? : Unable Difficulty sitting down on and standing up from a chair with arms (e.g., wheelchair, bedside commode, etc,.)?: Unable Help needed moving to and from a bed to chair (including a wheelchair)?: A Lot Help needed walking in hospital room?: A Lot Help needed climbing 3-5 steps with a railing? : A Lot 6 Click Score: 9    End of Session Equipment Utilized During Treatment: Oxygen;Gait belt Activity Tolerance: Patient limited by fatigue(pt limited by anxiety) Patient left: in bed;with call bell/phone within reach   PT Visit Diagnosis: Dizziness and giddiness (R42);Muscle weakness (generalized) (M62.81);Difficulty in walking, not elsewhere classified (R26.2)     Time: 3354-5625 PT Time Calculation (min) (ACUTE ONLY): 24 min  Charges:  $Therapeutic Activity: 23-37 mins                    G Codes:       [name], SPT   [name] 01/08/2017, 12:16 PM

## 2017-01-08 NOTE — Telephone Encounter (Signed)
Oral Oncology Pharmacist Encounter  Received new prescription for Imbruvica (ibrutinib) for the treatment of mantle cell lymphoma with or without rituximab, planned duration until disease progression or unacceptable toxicity. Noted patient currently admitted at [facility] for small acute left cerebellar ischemic infarct..  Labs from Epic assessed, noted thrombocytopenia (pltc=48k) and hepatic dysfunction (INR = 1.52, TBili = 1.8, albumin = 1.8; calculates to Child Pugh Class B). Manufacturer recommends dose reduction of Imbruvica with hepatic dysfunction. Noted dose already reduced to 420mg  daily for increased toleration. Recommend initiation at 140mg  daily and titrating up based on toleration due to cardiac co-morbidities (atrial fibrillation, hypotension), hepatic dysfunction, and thrombocytopenia.  QTc very elongated at 519 msec on 12/11/16.  Current medication list in Epic reviewed, DDIs with Imbruvica identified:  Imbruvica and diltiazem: category D interaction: diltiazem is a moderate inhibitor of CYP3A4 and will decrease Imbruvica metabolism and clerance from the body. Interaction will likely lead to 2-3-fold increase in Imbruvica AUC. Recommend 50% dose reduction of Imbruvica at initiation. Will above recommendation of starting dose at 140mg  daily, could consider initiation at 70mg  daily.  Imbruvica and aspirin: category C interaction: Kate Sable has been shown increase risk of bruising and bleeding, this risk is increased with coadministration of anti-platelets agents. Risk versus benefit of continued aspirin should be considered in light of CVA history and current thrombocytopenia.  Prescription has been e-scribed to the [facility] for benefits analysis and approval. Prior authorization has been submitted and is approved. Copayment $3.70 for 1 month supply at this time.  Prescription will be re-sent after discussion with MD about appropriate starting dose and start  date.  Oral Oncology Clinic will continue to follow for initial counseling and start date.  Johny Drilling, PharmD, BCPS, BCOP 01/08/2017 3:49 PM Oral Oncology Clinic [phone]

## 2017-01-08 NOTE — Progress Notes (Addendum)
PROGRESS NOTE    [name] [name]  HDQ:[id number] DOB: November 07, 1950 DOA: 01/10/2017 PCP: Wenda Low, MD   Brief Narrative:  66 y.o. 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 Pt also reported he had a fall the day prior in which he hit his head. He was found to be dehydrated, anemic, thrombocytopenic, tachycardic and in acute renal failure.      Subjective: 12/27 A/O 4, negative CP, negative SOB, negative abdominal pain, negative N/V         Assessment & Plan:   Active Problems:   Essential hypertension   Hyperlipidemia   Atrial fibrillation with RVR (HCC)   Normocytic anemia   Lymphadenopathy   Mantle cell lymphoma (HCC)   Hypotension   AKI (acute kidney injury) ([place])  Hypotension with near syncope -Most likely multifactorial acute stroke, anemia, orthostatic hypotension, dehydration from chemotherapy.  -Resolved with fluid resuscitation.   Anemia of chronic disease secondary to malignancy and chemotherapy? 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  Jana Hakim oncology; continue Rituximab -He would be an excellent candidate for ibrutinib but this can lead to bruising/bleeding and can worsen Afib. For now we are working to increase the rituximab dose and can repeat prednisone next week.     Acute kidney injury  -prerenal in etiology  Recent Labs  Lab 01/02/17 0827 01/04/17 0607 01/05/17 0438 01/06/17 0510 01/07/17 0409 01/08/17 0352  CREATININE 0.91 0.64 0.62 0.65 0.64 0.54*  -Resolved-  Thrombocytopenia  -Secondary to stress reaction, malignancy, chemotherapy -Trending down, currently no overt signs of bleeding -If patient has overt signs of bleeding transfuse platelets   HLD  Cont Lipitor   Chronic Diastolic CHF -Strict in and out -Daily weight -Future hemoglobin<   8 -S/P 2U PRBC transfusion  -12/23 Transfuse 1 unit PRBC -12/27 transfuse 1 unit PRBC  Chronic Atrial Fibrillation -Currently NSR -Controlled -Anticoagulation on hold secondary to severe anemia -Amiodarone 200 mg daily -Cardizem 180 mg daily -Metoprolol 37.5 mg BID  Pulmonary HTN -See CHF    Right upper extremity swelling chronic  - possibly from lymph node involvement from mantle cell lymphoma.  Hypomagnesia -Magnesium> 2 -Magnesium IV 3 g   DVT prophylaxis: SCD Code Status: Full Family Communication: None Disposition Plan: DVD   Consultants:  Oncology Neurology  Procedures/Significant Events:     I have personally reviewed and interpreted all radiology studies and my findings are as above.  VENTILATOR SETTINGS:    Cultures 12/21/2 blood positive coag negative staph (most likely contaminant 12/21 urine positive insignificant growth     Antimicrobials: Antibiotics Given (last 72 hours)    None  Devices    LINES / TUBES:      Continuous Infusions: . sodium chloride 50 mL/hr at 01/06/17 1900     Objective: Vitals:   01/07/17 2153 01/08/17 0159 01/08/17 0500 01/08/17 0528  BP: (!) 143/63 (!) 114/56   132/66  Pulse: (!) 108 91  87  Resp: 18 18  18   Temp: 98 F (36.7 C) 97.7 F (36.5 C)  97.9 F (36.6 C)  TempSrc: Oral Oral  Oral  SpO2: 100% 98%  99%  Weight:   211 lb 10.3 oz (96 kg)   Height:        Intake/Output Summary (Last 24 hours) at 01/08/2017 0815 Last data filed at 01/08/2017 0529 Gross per 24 hour  Intake -  Output 450 ml  Net -450 ml   Filed Weights   01/05/17 0528 01/06/17 0533 01/08/17 0500  Weight: 203 lb 7.8 oz (92.3 kg) 211 lb 3.2 oz (95.8 kg) 211 lb 10.3 oz (96 kg)    Physical Exam:  General: A/O 4, No acute respiratory distress Neck:  Negative scars, masses, torticollis, lymphadenopathy, JVD Lungs: Clear to auscultation bilaterally without wheezes or crackles Cardiovascular: Regular rate and rhythm without murmur gallop or rub normal S1 and S2 Abdomen: Morbidly obese, negative abdominal pain, nondistended, positive soft, bowel sounds, no rebound, no ascites, no appreciable mass Extremities: No significant cyanosis, clubbing, or edema bilateral lower extremities Skin: Negative rashes, lesions, ulcers Psychiatric:  Negative depression, negative anxiety, negative fatigue, negative mania  Central nervous system:  Cranial nerves II through XII intact, tongue/uvula midline, all extremities muscle strength 5/5, sensation intact throughout,  negative dysarthria, negative expressive aphasia, negative receptive aphasia. .     Data Reviewed: Care during the described time interval was provided by me .  I have reviewed this patient's available data, including medical history, events of note, physical examination, and all test results as part of my evaluation.   CBC: Recent Labs  Lab 01/09/2017 1511  01/04/17 0607 01/05/17 0438 01/06/17 0510 01/07/17 0409 01/08/17 0352  WBC 6.7  --  4.9 6.0 5.7 6.5 6.3  NEUTROABS 3.5  --   --   --   --   --   --   HGB 6.2*   < > 7.3* 7.8* 7.6* 7.4* 7.3*  HCT 20.1*   < > 24.0* 25.2* 24.5* 24.1* 23.8*  MCV 98.0  --  99.6  97.7 99.6 98.8 98.8  PLT 75*  --  61* 47* 42* 50* 48*   < > = values in this interval not displayed.   Basic Metabolic Panel: Recent Labs  Lab 01/04/17 0607 01/05/17 0438 01/06/17 0510 01/07/17 0409 01/08/17 0352  NA 140 138 138 136 138  K 3.5 3.4* 3.4* 3.9 3.9  CL 115* 110 109 108 109  CO2 21* 22 24 20* 24  GLUCOSE 79 85 82 90 103*  BUN 11 11 11 11 10   CREATININE 0.64 0.62 0.65 0.64 0.54*  CALCIUM 7.5* 7.7* 7.7* 7.5* 7.9*  MG  --  1.7 1.7 1.5* 1.6*   GFR: Estimated Creatinine Clearance: 109.2 mL/min (A) (by C-G formula based on SCr of 0.54 mg/dL (L)). Liver Function Tests: Recent Labs  Lab 12/29/2016 1217 12/23/2016 1511 01/02/17 0827 01/04/17 0607  AST 32 36 32 21  ALT 12 15* 16* 11*  ALKPHOS 110 88 82 71  BILITOT 1.83* 1.6* 2.2* 1.8*  PROT 5.2* 5.2* 4.8* 4.1*  ALBUMIN 2.1* 2.1* 2.0* 1.8*   No results for input(s): LIPASE, AMYLASE  in the last 168 hours. No results for input(s): AMMONIA in the last 168 hours. Coagulation Profile: No results for input(s): INR, PROTIME in the last 168 hours. Cardiac Enzymes: No results for input(s): CKTOTAL, CKMB, CKMBINDEX, TROPONINI in the last 168 hours. BNP (last 3 results) No results for input(s): PROBNP in the last 8760 hours. HbA1C: No results for input(s): HGBA1C in the last 72 hours. CBG: No results for input(s): GLUCAP in the last 168 hours. Lipid Profile: No results for input(s): CHOL, HDL, LDLCALC, TRIG, CHOLHDL, LDLDIRECT in the last 72 hours. Thyroid Function Tests: No results for input(s): TSH, T4TOTAL, FREET4, T3FREE, THYROIDAB in the last 72 hours. Anemia Panel: No results for input(s): VITAMINB12, FOLATE, FERRITIN, TIBC, IRON, RETICCTPCT in the last 72 hours. Urine analysis:    Component Value Date/Time   COLORURINE AMBER (A) 01/02/2017 0117   APPEARANCEUR CLEAR 01/02/2017 0117   LABSPEC 1.021 01/02/2017 0117   PHURINE 5.0 01/02/2017 0117   GLUCOSEU NEGATIVE 01/02/2017 0117   HGBUR NEGATIVE 01/02/2017 0117     BILIRUBINUR NEGATIVE 01/02/2017 0117   KETONESUR 5 (A) 01/02/2017 0117   PROTEINUR 100 (A) 01/02/2017 0117   NITRITE NEGATIVE 01/02/2017 0117   LEUKOCYTESUR NEGATIVE 01/02/2017 0117   Sepsis Labs: @LABRCNTIP (procalcitonin:4,lacticidven:4)  ) Recent Results (from the past 240 hour(s))  Blood Culture (routine x 2)     Status: Abnormal   Collection Time: 12/31/2016  3:26 PM  Result Value Ref Range Status   Specimen Description BLOOD LEFT FOREARM  Final   Special Requests   Final    BOTTLES DRAWN AEROBIC AND ANAEROBIC Blood Culture adequate volume   Culture  Setup Time   Final    GRAM POSITIVE COCCI IN BOTH AEROBIC AND ANAEROBIC BOTTLES CRITICAL RESULT CALLED TO, READ BACK BY AND VERIFIED WITH: N GLOGOVAC,PHARMD AT 0737 01/02/17 BY L BENFIELD    Culture (A)  Final    STAPHYLOCOCCUS SPECIES (COAGULASE NEGATIVE) THE SIGNIFICANCE OF ISOLATING THIS ORGANISM FROM A SINGLE SET OF BLOOD CULTURES WHEN MULTIPLE SETS ARE DRAWN IS UNCERTAIN. PLEASE NOTIFY THE MICROBIOLOGY DEPARTMENT WITHIN ONE WEEK IF SPECIATION AND SENSITIVITIES ARE REQUIRED. Performed at [facility], [place] [address] [place], [place] [address]    Report Status 01/04/2017 FINAL  Final  Blood Culture ID Panel (Reflexed)     Status: Abnormal   Collection Time: 12/15/2016  3:26 PM  Result Value Ref Range Status   Enterococcus species NOT DETECTED NOT DETECTED Final   Listeria monocytogenes NOT DETECTED NOT DETECTED Final   Staphylococcus species DETECTED (A) NOT DETECTED Final    Comment: Methicillin (oxacillin) resistant coagulase negative staphylococcus. Possible blood culture contaminant (unless isolated from more than one blood culture draw or clinical case suggests pathogenicity). No antibiotic treatment is indicated for blood  culture contaminants. CRITICAL RESULT CALLED TO, READ BACK BY AND VERIFIED WITH: N GLOGOVAC,PHARMD AT 1240 01/02/17 BY L BENFIELD    Staphylococcus aureus NOT DETECTED NOT DETECTED Final    Methicillin resistance DETECTED (A) NOT DETECTED Final    Comment: CRITICAL RESULT CALLED TO, READ BACK BY AND VERIFIED WITH: N GLOGOVAC,PHARMD AT 1240 01/02/17 BY L BENFIELD    Streptococcus species NOT DETECTED NOT DETECTED Final   Streptococcus agalactiae NOT DETECTED NOT DETECTED Final   Streptococcus pneumoniae NOT DETECTED NOT DETECTED Final   Streptococcus pyogenes NOT DETECTED NOT DETECTED Final   Acinetobacter baumannii NOT DETECTED NOT DETECTED Final   Enterobacteriaceae species NOT DETECTED NOT DETECTED Final   Enterobacter cloacae complex NOT DETECTED NOT  DETECTED Final   Escherichia coli NOT DETECTED NOT DETECTED Final   Klebsiella oxytoca NOT DETECTED NOT DETECTED Final   Klebsiella pneumoniae NOT DETECTED NOT DETECTED Final   Proteus species NOT DETECTED NOT DETECTED Final   Serratia marcescens NOT DETECTED NOT DETECTED Final   Haemophilus influenzae NOT DETECTED NOT DETECTED Final   Neisseria meningitidis NOT DETECTED NOT DETECTED Final   Pseudomonas aeruginosa NOT DETECTED NOT DETECTED Final   Candida albicans NOT DETECTED NOT DETECTED Final   Candida glabrata NOT DETECTED NOT DETECTED Final   Candida krusei NOT DETECTED NOT DETECTED Final   Candida parapsilosis NOT DETECTED NOT DETECTED Final   Candida tropicalis NOT DETECTED NOT DETECTED Final    Comment: Performed at [facility], [place] [address], [place], [place] [address]  Blood Culture (routine x 2)     Status: None   Collection Time: 12/31/2016  3:57 PM  Result Value Ref Range Status   Specimen Description BLOOD LEFT FOREARM  Final   Special Requests   Final    BOTTLES DRAWN AEROBIC AND ANAEROBIC Blood Culture adequate volume   Culture   Final    NO GROWTH 5 DAYS Performed at [facility], [place] [address], [place], [place] [address]    Report Status 01/06/2017 FINAL  Final  Urine culture     Status: Abnormal   Collection Time: 01/02/17  1:18 AM  Result Value Ref Range Status   Specimen  Description URINE, RANDOM  Final   Special Requests NONE  Final   Culture (A)  Final    <10,000 COLONIES/mL INSIGNIFICANT GROWTH Performed at [facility], [place] [address], [place], [place] [address]    Report Status 01/03/2017 FINAL  Final         Radiology Studies: No results found.      Scheduled Meds: . allopurinol  300 mg Oral Daily  . amiodarone  200 mg Oral Daily  . atorvastatin  20 mg Oral q1800  . diltiazem  180 mg Oral Daily  . ferrous sulfate  325 mg Oral Q breakfast  . folic acid  1 mg Oral Daily  . metoprolol tartrate  37.5 mg Oral BID  . pantoprazole  40 mg Oral Q0600   Continuous Infusions: . sodium chloride 50 mL/hr at 01/06/17 1900     LOS: 6 days    Time spent: 40 minutes    Kelis Plasse, Geraldo Docker, MD [facility] Pager [phone]   If 7PM-7AM, please contact night-coverage [email or web address] Password [email or web address] 01/08/2017, 8:15 AM

## 2017-01-09 LAB — BASIC METABOLIC PANEL
ANION GAP: 9 (ref 5–15)
BUN: 11 mg/dL (ref 6–20)
CHLORIDE: 106 mmol/L (ref 101–111)
CO2: 23 mmol/L (ref 22–32)
Calcium: 7.8 mg/dL — ABNORMAL LOW (ref 8.9–10.3)
Creatinine, Ser: 0.57 mg/dL — ABNORMAL LOW (ref 0.61–1.24)
GFR calc Af Amer: >60 mL/min (ref >60)
GLUCOSE: 109 mg/dL — AB (ref 65–99)
POTASSIUM: 3.8 mmol/L (ref 3.5–5.1)
Sodium: 138 mmol/L (ref 135–145)

## 2017-01-09 LAB — TYPE AND SCREEN
ABO/RH(D): A POS
Antibody Screen: NEGATIVE
Unit division: 0

## 2017-01-09 LAB — CBC
HEMATOCRIT: 25.9 % — AB (ref 39.0–52.0)
HEMOGLOBIN: 8.2 g/dL — AB (ref 13.0–17.0)
MCH: 30.9 pg (ref 26.0–34.0)
MCHC: 31.7 g/dL (ref 30.0–36.0)
MCV: 97.7 fL (ref 78.0–100.0)
Platelets: 53 10*3/uL — ABNORMAL LOW (ref 150–400)
RBC: 2.65 MIL/uL — AB (ref 4.22–5.81)
RDW: 19.9 % — ABNORMAL HIGH (ref 11.5–15.5)
WBC: 6.7 10*3/uL (ref 4.0–10.5)

## 2017-01-09 LAB — BPAM RBC
BLOOD PRODUCT EXPIRATION DATE: 201901012359
ISSUE DATE / TIME: 201812271112
Unit Type and Rh: 600

## 2017-01-09 LAB — MAGNESIUM: Magnesium: 1.9 mg/dL (ref 1.7–2.4)

## 2017-01-09 MED ORDER — LORAZEPAM 2 MG/ML IJ SOLN
2.0000 mg | Freq: Once | INTRAMUSCULAR | Status: AC
  Administered 2017-01-09: 2 mg via INTRAVENOUS
  Filled 2017-01-09: qty 1

## 2017-01-09 MED ORDER — LORAZEPAM 2 MG/ML IJ SOLN
0.5000 mg | Freq: Three times a day (TID) | INTRAMUSCULAR | Status: DC | PRN
  Administered 2017-01-09 – 2017-01-12 (×7): 1 mg via INTRAVENOUS
  Filled 2017-01-09 (×8): qty 1

## 2017-01-09 NOTE — Care Management Note (Signed)
Case Management Note  Patient Details  Name: [name] [name] MRN: [id number] Date of Birth: 18-Aug-1950  Subjective/Objective:                    Action/Plan: Plan is for patient to return to [facility] when medically stable. CM following.   Expected Discharge Date:                  Expected Discharge Plan:  Skilled Nursing Facility  In-House Referral:  Clinical Social Work  Discharge planning Services  CM Consult  Post Acute Care Choice:    Choice offered to:     DME Arranged:    DME Agency:     HH Arranged:    [place] Agency:     Status of Service:  In process, will continue to follow  If discussed at Long Length of Stay Meetings, dates discussed:    Additional Comments:  Pollie Friar, RN 01/09/2017, 3:39 PM

## 2017-01-09 NOTE — Progress Notes (Signed)
[name] [name]   DOB:1950/11/21   OZ#:[id number]   [id number]#:[id number]  Subjective:  [name] [name] was very upset today because again he had been set up in bed in such a way that he could not reach the food tray in front of him easily.  He has very restricted arm motions of course.  The right arm is chronically contracted and swollen from her a remote accident.  He tells me that he is getting some exercises, but states he was left in a wooden chair overnight at one-point.  It is difficult to know what that possibly could refer to.  In terms of confusion today he certainly knew who I was and where he was.  No family in room  Objective: [name] examined in bed Vitals:   01/09/17 0500 01/09/17 0929  BP: 137/77 (!) 127/59  Pulse: 82 93  Resp: 18 18  Temp: (!) 97.5 F (36.4 C) 98.1 F (36.7 C)  SpO2: 97% 96%    Body mass index is 28.7 kg/m.  Intake/Output Summary (Last 24 hours) at 01/09/2017 1042 Last data filed at 01/09/2017 0000 Gross per 24 hour  Intake 920 ml  Output 500 ml  Net 420 ml    Sclerae unicteric, EOMs intact Cervical/supraclavicular adenopathy as previously noted Lungs no rales or rhonchi, auscultated anterolaterally Heart regular rate and rhythm Abd soft, nontender, positive bowel sounds MSK chronic right upper extremity swelling, related to remote accident Neuro: nonfocal, anxious affect  CBG (last 3)  No results for input(s): GLUCAP in the last 72 hours.   Labs:  Lab Results  Component Value Date   WBC 6.7 01/09/2017   HGB 8.2 (L) 01/09/2017   HCT 25.9 (L) 01/09/2017   MCV 97.7 01/09/2017   PLT 53 (L) 01/09/2017   NEUTROABS 3.5 12/15/2016     Urine Studies No results for input(s): UHGB, CRYS in the last 72 hours.  Invalid input(s): UACOL, UAPR, USPG, UPH, UTP, UGL, UKET, UBIL, UNIT, UROB, [place], UEPI, UWBC, Duwayne Heck [place], [place]  Basic Metabolic Panel: Recent Labs  Lab 01/05/17 0438 01/06/17 0510 01/07/17 0409 01/08/17 0352  01/09/17 0854  NA 138 138 136 138 138  K 3.4* 3.4* 3.9 3.9 3.8  CL 110 109 108 109 106  CO2 22 24 20* 24 23  GLUCOSE 85 82 90 103* 109*  BUN '11 11 11 10 11  '$ CREATININE 0.62 0.65 0.64 0.54* 0.57*  CALCIUM 7.7* 7.7* 7.5* 7.9* 7.8*  MG 1.7 1.7 1.5* 1.6* 1.9   GFR Estimated Creatinine Clearance: 109.2 mL/min (A) (by C-G formula based on SCr of 0.57 mg/dL (L)). Liver Function Tests: Recent Labs  Lab 01/04/17 0607  AST 21  ALT 11*  ALKPHOS 71  BILITOT 1.8*  PROT 4.1*  ALBUMIN 1.8*   No results for input(s): LIPASE, AMYLASE in the last 168 hours. No results for input(s): AMMONIA in the last 168 hours. Coagulation profile No results for input(s): INR, PROTIME in the last 168 hours.  CBC: Recent Labs  Lab 01/05/17 0438 01/06/17 0510 01/07/17 0409 01/08/17 0352 01/09/17 0854  WBC 6.0 5.7 6.5 6.3 6.7  HGB 7.8* 7.6* 7.4* 7.3* 8.2*  HCT 25.2* 24.5* 24.1* 23.8* 25.9*  MCV 97.7 99.6 98.8 98.8 97.7  PLT 47* 42* 50* 48* 53*   Cardiac Enzymes: No results for input(s): CKTOTAL, CKMB, CKMBINDEX, TROPONINI in the last 168 hours. BNP: Invalid input(s): POCBNP CBG: No results for input(s): GLUCAP in the last 168 hours. D-Dimer No results for  input(s): DDIMER in the last 72 hours. Hgb A1c No results for input(s): HGBA1C in the last 72 hours. Lipid Profile No results for input(s): CHOL, HDL, LDLCALC, TRIG, CHOLHDL, LDLDIRECT in the last 72 hours. Thyroid function studies No results for input(s): TSH, T4TOTAL, T3FREE, THYROIDAB in the last 72 hours.  Invalid input(s): FREET3 Anemia work up No results for input(s): VITAMINB12, FOLATE, FERRITIN, TIBC, IRON, RETICCTPCT in the last 72 hours. Microbiology Recent Results (from the past 240 hour(s))  Blood Culture (routine x 2)     Status: Abnormal   Collection Time: 12/22/2016  3:26 PM  Result Value Ref Range Status   Specimen Description BLOOD LEFT FOREARM  Final   Special Requests   Final    BOTTLES DRAWN AEROBIC AND ANAEROBIC  Blood Culture adequate volume   Culture  Setup Time   Final    GRAM POSITIVE COCCI IN BOTH AEROBIC AND ANAEROBIC BOTTLES CRITICAL RESULT CALLED TO, READ BACK BY AND VERIFIED WITH: N GLOGOVAC,PHARMD AT 7510 01/02/17 BY L BENFIELD    Culture (A)  Final    STAPHYLOCOCCUS SPECIES (COAGULASE NEGATIVE) THE SIGNIFICANCE OF ISOLATING THIS ORGANISM FROM A SINGLE SET OF BLOOD CULTURES WHEN MULTIPLE SETS ARE DRAWN IS UNCERTAIN. PLEASE NOTIFY THE MICROBIOLOGY DEPARTMENT WITHIN ONE WEEK IF SPECIATION AND SENSITIVITIES ARE REQUIRED. Performed at [facility], [place] [address] [place], [place] [address]    Report Status 01/04/2017 FINAL  Final  Blood Culture ID Panel (Reflexed)     Status: Abnormal   Collection Time: 12/29/2016  3:26 PM  Result Value Ref Range Status   Enterococcus species NOT DETECTED NOT DETECTED Final   Listeria monocytogenes NOT DETECTED NOT DETECTED Final   Staphylococcus species DETECTED (A) NOT DETECTED Final    Comment: Methicillin (oxacillin) resistant coagulase negative staphylococcus. Possible blood culture contaminant (unless isolated from more than one blood culture draw or clinical case suggests pathogenicity). No antibiotic treatment is indicated for blood  culture contaminants. CRITICAL RESULT CALLED TO, READ BACK BY AND VERIFIED WITH: N GLOGOVAC,PHARMD AT 1240 01/02/17 BY L BENFIELD    Staphylococcus aureus NOT DETECTED NOT DETECTED Final   Methicillin resistance DETECTED (A) NOT DETECTED Final    Comment: CRITICAL RESULT CALLED TO, READ BACK BY AND VERIFIED WITH: N GLOGOVAC,PHARMD AT 1240 01/02/17 BY L BENFIELD    Streptococcus species NOT DETECTED NOT DETECTED Final   Streptococcus agalactiae NOT DETECTED NOT DETECTED Final   Streptococcus pneumoniae NOT DETECTED NOT DETECTED Final   Streptococcus pyogenes NOT DETECTED NOT DETECTED Final   Acinetobacter baumannii NOT DETECTED NOT DETECTED Final   Enterobacteriaceae species NOT DETECTED NOT DETECTED Final    Enterobacter cloacae complex NOT DETECTED NOT DETECTED Final   Escherichia coli NOT DETECTED NOT DETECTED Final   Klebsiella oxytoca NOT DETECTED NOT DETECTED Final   Klebsiella pneumoniae NOT DETECTED NOT DETECTED Final   Proteus species NOT DETECTED NOT DETECTED Final   Serratia marcescens NOT DETECTED NOT DETECTED Final   Haemophilus influenzae NOT DETECTED NOT DETECTED Final   Neisseria meningitidis NOT DETECTED NOT DETECTED Final   Pseudomonas aeruginosa NOT DETECTED NOT DETECTED Final   Candida albicans NOT DETECTED NOT DETECTED Final   Candida glabrata NOT DETECTED NOT DETECTED Final   Candida krusei NOT DETECTED NOT DETECTED Final   Candida parapsilosis NOT DETECTED NOT DETECTED Final   Candida tropicalis NOT DETECTED NOT DETECTED Final    Comment: Performed at [facility], [place]. [address], [place],  [address]  Blood Culture (routine  x 2)     Status: None   Collection Time: 12/13/2016  3:57 PM  Result Value Ref Range Status   Specimen Description BLOOD LEFT FOREARM  Final   Special Requests   Final    BOTTLES DRAWN AEROBIC AND ANAEROBIC Blood Culture adequate volume   Culture   Final    NO GROWTH 5 DAYS Performed at [facility], [address] [place], [place] [address]    Report Status 01/06/2017 FINAL  Final  Urine culture     Status: Abnormal   Collection Time: 01/02/17  1:18 AM  Result Value Ref Range Status   Specimen Description URINE, RANDOM  Final   Special Requests NONE  Final   Culture (A)  Final    <10,000 COLONIES/mL INSIGNIFICANT GROWTH Performed at [facility], [place] [address] [place], [place] [address]    Report Status 01/03/2017 FINAL  Final      Studies:  Ct Angio Head W Or Wo Contrast  Result Date: 01/02/2017 CLINICAL DATA:  Stroke follow-up EXAM: CT ANGIOGRAPHY HEAD AND NECK TECHNIQUE: Multidetector CT imaging of the head and neck was performed using the standard protocol during bolus administration of intravenous  contrast. Multiplanar CT image reconstructions and MIPs were obtained to evaluate the vascular anatomy. Carotid stenosis measurements (when applicable) are obtained utilizing NASCET criteria, using the distal internal carotid diameter as the denominator. CONTRAST:  44m ISOVUE-370 IOPAMIDOL (ISOVUE-370) INJECTION 76% COMPARISON:  None. FINDINGS: CT HEAD FINDINGS Brain: No mass lesion, intraparenchymal hemorrhage or extra-axial collection. No evidence of acute cortical infarct. The corpus callosum is absent. There is an old right frontal lobe infarct. Vascular: No hyperdense vessel or unexpected calcification. Skull: Normal visualized skull base, calvarium and extracranial soft tissues. Sinuses/Orbits: There is moderate mucosal thickening of the right maxillary sinus and mild left maxillary mucosal thickening. Fluid level in the right sphenoid sinus. Normal orbits. CTA NECK FINDINGS Aortic arch: There is mild calcific atherosclerosis of the aortic arch. There is no aneurysm, dissection or hemodynamically significant stenosis of the visualized ascending aorta and aortic arch. Conventional 3 vessel aortic branching pattern. The visualized proximal subclavian arteries are normal. Right carotid system: The right common carotid origin is widely patent. There is no common carotid or internal carotid artery dissection or aneurysm. No hemodynamically significant stenosis. Left carotid system: The left common carotid origin is widely patent. There is no common carotid or internal carotid artery dissection or aneurysm. No hemodynamically significant stenosis. Vertebral arteries: The vertebral system is left dominant. Both vertebral artery origins are normal. Both vertebral arteries are normal to their confluence with the basilar artery. Skeleton: There is no bony spinal canal stenosis. No lytic or blastic lesions. Other neck: There are bilateral massively enlarged lymph nodes at all cervical levels, with the largest located at  right level 5 AA, measuring up to 5.2 cm. Nodes extend into the upper mediastinum. Upper chest: Small bilateral pleural effusions. Review of the MIP images confirms the above findings CTA HEAD FINDINGS Anterior circulation: --Intracranial internal carotid arteries: Normal. --Anterior cerebral arteries: Normal. --Middle cerebral arteries: Normal. --Posterior communicating arteries: Absent bilaterally. Posterior circulation: --Posterior cerebral arteries: Normal. --Superior cerebellar arteries: Normal. --Basilar artery: Normal. Venous sinuses: As permitted by contrast timing, patent. Anatomic variants: None Delayed phase: No parenchymal contrast enhancement. Review of the MIP images confirms the above findings IMPRESSION: 1. No emergent large vessel occlusion or high-grade stenosis of the major intracranial and cervical arteries. 2.  Aortic Atherosclerosis (ICD10-I70.0). 3. Massively enlarged  bilateral cervical lymph nodes throughout the neck, generally unchanged compared to 11/21/2016 and consistent with the diagnosis of non-Hodgkin's lymphoma. 4. 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 5. Small bilateral pleural effusions. Electronically Signed   By: Ulyses Jarred M.D.   On: 01/02/2017 22:18   Dg Chest 2 View  Result Date: 12/23/2016 CLINICAL DATA:  Sepsis, hypotensive EXAM: CHEST  2 VIEW COMPARISON:  12/11/2016, CT 11/27/2016 FINDINGS: Mild cardiomegaly with aortic atherosclerosis. Tiny pleural effusions. Vague opacities are suspected in the right perihilar region and left lung base. No pneumothorax. IMPRESSION: 1. Mild cardiomegaly with tiny pleural effusion 2. Vague right upper lobe/perihilar and left lung base pulmonary opacity, could reflect small foci of infection or pulmonary nodules. Electronically Signed   By: Donavan Foil M.D.   On: 12/18/2016 16:25   Ct Angio Neck W Or Wo Contrast  Result Date: 01/02/2017 CLINICAL DATA:  Stroke follow-up EXAM: CT  ANGIOGRAPHY HEAD AND NECK TECHNIQUE: Multidetector CT imaging of the head and neck was performed using the standard protocol during bolus administration of intravenous contrast. Multiplanar CT image reconstructions and MIPs were obtained to evaluate the vascular anatomy. Carotid stenosis measurements (when applicable) are obtained utilizing NASCET criteria, using the distal internal carotid diameter as the denominator. CONTRAST:  29m ISOVUE-370 IOPAMIDOL (ISOVUE-370) INJECTION 76% COMPARISON:  None. FINDINGS: CT HEAD FINDINGS Brain: No mass lesion, intraparenchymal hemorrhage or extra-axial collection. No evidence of acute cortical infarct. The corpus callosum is absent. There is an old right frontal lobe infarct. Vascular: No hyperdense vessel or unexpected calcification. Skull: Normal visualized skull base, calvarium and extracranial soft tissues. Sinuses/Orbits: There is moderate mucosal thickening of the right maxillary sinus and mild left maxillary mucosal thickening. Fluid level in the right sphenoid sinus. Normal orbits. CTA NECK FINDINGS Aortic arch: There is mild calcific atherosclerosis of the aortic arch. There is no aneurysm, dissection or hemodynamically significant stenosis of the visualized ascending aorta and aortic arch. Conventional 3 vessel aortic branching pattern. The visualized proximal subclavian arteries are normal. Right carotid system: The right common carotid origin is widely patent. There is no common carotid or internal carotid artery dissection or aneurysm. No hemodynamically significant stenosis. Left carotid system: The left common carotid origin is widely patent. There is no common carotid or internal carotid artery dissection or aneurysm. No hemodynamically significant stenosis. Vertebral arteries: The vertebral system is left dominant. Both vertebral artery origins are normal. Both vertebral arteries are normal to their confluence with the basilar artery. Skeleton: There is no  bony spinal canal stenosis. No lytic or blastic lesions. Other neck: There are bilateral massively enlarged lymph nodes at all cervical levels, with the largest located at right level 5 AA, measuring up to 5.2 cm. Nodes extend into the upper mediastinum. Upper chest: Small bilateral pleural effusions. Review of the MIP images confirms the above findings CTA HEAD FINDINGS Anterior circulation: --Intracranial internal carotid arteries: Normal. --Anterior cerebral arteries: Normal. --Middle cerebral arteries: Normal. --Posterior communicating arteries: Absent bilaterally. Posterior circulation: --Posterior cerebral arteries: Normal. --Superior cerebellar arteries: Normal. --Basilar artery: Normal. Venous sinuses: As permitted by contrast timing, patent. Anatomic variants: None Delayed phase: No parenchymal contrast enhancement. Review of the MIP images confirms the above findings IMPRESSION: 1. No emergent large vessel occlusion or high-grade stenosis of the major intracranial and cervical arteries. 2.  Aortic Atherosclerosis (ICD10-I70.0). 3. Massively enlarged bilateral cervical lymph nodes throughout the neck, generally unchanged compared to 11/21/2016 and consistent with the diagnosis of non-Hodgkin's lymphoma. 4. Congenital  agenesis of the corpus callosum. Old right frontoparietal infarct. 5. Small bilateral pleural effusions. Electronically Signed   By: Ulyses Jarred M.D.   On: 01/02/2017 22:18   Ct Humerus Right Wo Contrast  Result Date: 12/13/2016 CLINICAL DATA:  Right upper arm mass.  History mantle cell lymphoma. EXAM: CT OF THE RIGHT HUMERUS WITHOUT CONTRAST TECHNIQUE: Multidetector CT imaging was performed according to the standard protocol. Multiplanar CT image reconstructions were also generated. COMPARISON:  CT chest dated November 27, 2016. FINDINGS: Bones/Joint/Cartilage No acute fracture or malalignment. Mild degenerative changes of the acromioclavicular and glenohumeral joints. Osteopenia.  Ligaments Suboptimally assessed by CT. Muscles and Tendons Within the right biceps muscle, there is a large predominantly fat density mass measuring 10.2 x 11.6 x 16.5 cm. There is minimal internal stranding without focal nodularity. The rotator cuff is grossly intact. Soft tissues Markedly enlarged right axillary and lower right cervical lymph nodes. Unchanged simple cysts in the liver. IMPRESSION: 1. Large, 16.5 cm predominantly fat density mass within the right biceps muscle, most consistent with a lipoma. Minimal internal stranding without focal nodularity makes liposarcoma unlikely. 2. Markedly enlarged right axillary and lower right cervical lymph nodes, consistent with history of mantle cell lymphoma. Electronically Signed   By: Titus Dubin M.D.   On: 12/13/2016 14:15   [name] Brain Wo Contrast  Result Date: 12/25/2016 CLINICAL DATA:  Initial evaluation for acute altered mental status. History of min coma. EXAM: MRI HEAD WITHOUT CONTRAST TECHNIQUE: Multiplanar, multiecho pulse sequences of the brain and surrounding structures were obtained without intravenous contrast. COMPARISON:  None. FINDINGS: Brain: Study limited as the patient was unable to tolerate the full length of the exam. Additionally, images provided are markedly degraded by motion artifact. On coronal DWI sequence, there is a 5 mm focus of diffusion abnormality within the left cerebellar hemisphere, suspicious for possible small acute ischemic infarct (series 5, image 9). This is not well seen on axial sequence. No secondary findings to suggest associated hemorrhage or mass effect. No other convincing foci of restricted diffusion to suggest acute or subacute ischemia. Minimal patchy diffusion abnormality overlying the surface/cortical gray matter of the right parieto-occipital region favored to be artifactual in nature (series 3, image 27). No other acute intracranial process. Agenesis of the corpus callosum again noted. Encephalomalacia  out within the right parietal region related to prior hemorrhage. No mass lesion or midline shift. No significant mass effect. No extra-axial fluid collection. Vascular: Major intracranial vascular flow voids are maintained at the skullbase. Skull and upper cervical spine: Craniocervical junction within normal limits. Upper cervical spine normal. Bone marrow signal intensity grossly within normal limits. No scalp soft tissue abnormality. Sinuses/Orbits: The globes and orbital soft tissues within normal limits. Scattered mucosal thickening throughout the paranasal sinuses. Air-fluid levels within the maxillary and right sphenoid sinus. Trace left mastoid effusion. Other: None. IMPRESSION: 1. Limited exam as the patient was unable to tolerate the full length of the study. Additionally, images provided are degraded by motion. 2. Punctate 5 mm focus of diffusion abnormality within the left cerebellar hemisphere, suspicious for small acute ischemic infarct. 3. No other definite acute intracranial abnormality. 4. 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 5. Acute paranasal sinusitis as above. Electronically Signed   By: Jeannine Boga M.D.   On: 01/02/2017 20:57   US Scrotum  Result Date: 12/13/2016 CLINICAL DATA:  Chronic scrotal edema EXAM: ULTRASOUND OF SCROTUM TECHNIQUE: Complete ultrasound examination of the testicles, epididymis, and other scrotal structures was  performed. COMPARISON:  None. FINDINGS: Right testicle Measurements: 4.0 x 1.9 x 2.5 cm. No mass or microlithiasis visualized. Left testicle Measurements: 3.9 x 2.4 x 2.6 cm. No mass or microlithiasis visualized. Right epididymis:  4 mm cyst in the epididymal head. Left epididymis:  Normal in size and appearance. Hydrocele:  Bilateral hydroceles Varicocele:  None visualized. There is a complex fluid collection/cyst in the left scrotum measuring 8.2 x 6.4 x 6.2 cm. Layering internal debris. This is lateral to the left  testicle. Complex cystic area noted in the right inguinal canal with internal septations. IMPRESSION: No testicular abnormality. Complex cystic area within the right inguinal canal containing internal septations of unknown etiology. Large complex cystic area in the left lateral scrotum measuring up to 8.2 cm. This could reflect complex hydrocele or spermatocele. Electronically Signed   By: Rolm Baptise M.D.   On: 12/13/2016 13:27   Dg Chest Portable 1 View  Result Date: 12/11/2016 CLINICAL DATA:  Shortness of breath EXAM: PORTABLE CHEST 1 VIEW COMPARISON:  11/30/2016 FINDINGS: 1756 hours. The cardio pericardial silhouette is enlarged. The lungs are clear without focal pneumonia, edema, pneumothorax or pleural effusion. The visualized bony structures of the thorax are intact. Telemetry leads overlie the chest. IMPRESSION: Stable.  No acute cardiopulmonary findings. Electronically Signed   By: Misty Stanley M.D.   On: 12/11/2016 18:38    Assessment: 66 y.o. nursing home resident with atrial fibrillation, prior history of stroke, now with small left cerebellar lesion per brain MRI 01/03/2017, in the setting of mantle cell NHL  (1) normocytic anemia: with reticulocyte>100;though LDH and t bil are both elevated, DAT is negative for both IgG and complement and haptoglobin is WNL, not c/w hemolysis; B-12, folate and ferritin adequate;  consdier bleeding as the cause of his anemia, although guaiacs have been negative and he reports no GI complaints    (a) note this type of NHL frequently invades GI teract and can cause ulceration  (2)right cervical lymph node biopsy 11/27/2016: shows mantle cell non-Hodgkin's lymphoma;      (a) the Ki67 is high and the morphologyis c/wan aggressive pleomorphic variant (a) prednisone x5d started 12/11/2016, 5-day course,  X2 (b) started rituximab 12/11/2016, repeated weekly 12/07 and 12/13 at lower doses      (i) negative  Hep B/C studies 12/03/2016  (3) agenesis of corpus callosum (congenital)  (4) atrial fibrillation: On amiodarone; not anticoagulated because of bleeding concerns  (5) 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 Seeling appears to be making some progress in the rehab unit.  It is not clear to me how long he will be there.  He is scheduled for his next rituximab treatment 01/15/2017 in our office and I will review this with the staff next week assuming he is still in the hospital at that time.  The plan at this point is to continue the rituximab weekly at increasing doses.  Once he tolerates the maximal dose we will consider adding bendamustine.  He has multiple other problems including the atrial fibrillation and likely small recent cerebellar stroke.  Recall that mantle cell lymphoma which is the type of lymphoma this patient has has a predilection for spread to the GI system.  The patient does have significant anemia which may or may not be related to that.  He also very likely has marrow involvement from his lymphoma which compromises his ability to compensate for any bleeding episode  I will see him again next  week but please let me or my partners know if we can be of any help before that time.       Bobetta Lime, MD 01/09/2017  10:42 AM Medical Oncology and Hematology [facility] [address] [place], [place] [address] Tel. [phone]    Fax. [phone]

## 2017-01-09 NOTE — Progress Notes (Signed)
PROGRESS NOTE    [name] [name]  ZJQ:[id number] DOB: 05/21/1950 DOA: 12/15/2016 PCP: Wenda Low, MD   Brief Narrative:  66 y.o. 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 Pt also reported he had a fall the day prior in which he hit his head. He was found to be dehydrated, anemic, thrombocytopenic, tachycardic and in acute renal failure.      Subjective: 12/28 sleepy (received Ativan after extreme agitation).  Assessment & Plan:   Active Problems:   Essential hypertension   Hyperlipidemia   Atrial fibrillation with RVR (HCC)   Normocytic anemia   Lymphadenopathy   Mantle cell lymphoma (HCC)   Hypotension   AKI (acute kidney injury) ([place])  Hypotension with near syncope -Most likely multifactorial acute stroke, anemia, orthostatic hypotension, dehydration from chemotherapy.  -resolved.   Anemia of chronic disease secondary to malignancy and chemotherapy? -Occult blood negative -Anemia panel pending -S/P 2U PRBC transfusion  -12/23 transfuse 1 unit PRBC -Per review of EMR refused EGD during recent hospitalization Recent Labs  Lab 01/04/17 0607 01/05/17 0438 01/06/17 0510 01/07/17 0409 01/08/17 0352 01/09/17 0854  HGB 7.3* 7.8* 7.6* 7.4* 7.3* 8.2*  -Continue monitor closely  Acute Small Infarct Left Cerebral Hemisphere -Neurology directing complete CVA workup -Lipitor, avoid antiplatelet agents due to thrombocytopenia and anemia requiring transfusion  -Stroke team signed off on 12/22  -12/28 will reconsult stroke team any additional recommendations prior to discharge? When/if restart aspirin? Full dose aspirin vs baby aspirin?  Non-Hodgkin's mantle cell lymphoma on the right side of the neck -Further management as per Dr. Jana Hakim oncology; continue Rituximab -He would be an  excellent candidate for ibrutinib but this can lead to bruising/bleeding and can worsen Afib. For now we are working to increase the rituximab dose and can repeat prednisone next week.   Acute kidney injury  -prerenal in etiology  Recent Labs  Lab 01/02/17 0827 01/04/17 0607 01/05/17 0438 01/06/17 0510 01/07/17 0409 01/08/17 0352  CREATININE 0.91 0.64 0.62 0.65 0.64 0.54*  -Resolved-  Thrombocytopenia(baseline platelets WNL in November 2018)  -Secondary to stress reaction, malignancy, chemotherapy -Stabilized however not recover to baseline. Still<100 K -No overt signs of bleeding   HLD  -Lipitor 20 mg daily    Chronic Diastolic CHF -Strict in and out -Daily weight -Future hemoglobin<   8 -S/P 2U PRBC transfusion  -12/23 Transfuse 1 unit PRBC -12/27 transfuse 1 unit PRBC  Chronic Atrial Fibrillation -Currently NSR -Anticoagulation on hold secondary to severe anemia -Amiodarone 200 mg daily -Cardizem 180 mg daily -Metoprolol 37.5 mg BID  Pulmonary HTN -See CHF    Right upper extremity swelling chronic  - possibly from lymph node involvement from mantle cell lymphoma.  Hypomagnesia -Magnesium> 2 -Magnesium IV 3 g   DVT prophylaxis: SCD Code Status: Full Family Communication: None Disposition Plan: DVD   Consultants:  Oncology Neurology  Procedures/Significant Events:     I have personally reviewed and interpreted all radiology studies and my findings are as above.  VENTILATOR SETTINGS:    Cultures 12/21/2 blood positive coag negative staph (most likely contaminant 12/21 urine positive insignificant growth     Antimicrobials: Antibiotics Given (last 72 hours)    None       Devices    LINES / TUBES:      Continuous Infusions: . sodium chloride 1,000 mL (  01/09/17 0528)     Objective: Vitals:   01/08/17 1415 01/08/17 2110 01/09/17 0301 01/09/17 0500  BP: 138/60 129/68 (!) 123/54 137/77  Pulse: 90 (!) 119 100 82  Resp: 18  18 18 18   Temp: 98.7 F (37.1 C) 98.4 F (36.9 C) 98.4 F (36.9 C) (!) 97.5 F (36.4 C)  TempSrc: Oral Oral Oral Oral  SpO2: 92% 94% 96% 97%  Weight:      Height:        Intake/Output Summary (Last 24 hours) at 01/09/2017 0813 Last data filed at 01/09/2017 0000 Gross per 24 hour  Intake 920 ml  Output 500 ml  Net 420 ml   Filed Weights   01/05/17 0528 01/06/17 0533 01/08/17 0500  Weight: 203 lb 7.8 oz (92.3 kg) 211 lb 3.2 oz (95.8 kg) 211 lb 10.3 oz (96 kg)    Physical Exam:  General: Asleep (secondary to administration of Ativan), No acute respiratory distress Neck:  Negative scars, masses, torticollis, lymphadenopathy, JVD Lungs: Clear to auscultation bilaterally without wheezes or crackles Cardiovascular: Regular rate and rhythm without murmur gallop or rub normal S1 and S2 Abdomen: Morbidly obese, negative abdominal pain, nondistended, positive soft, bowel sounds, no rebound, no ascites, no appreciable mass Extremities: No significant cyanosis, clubbing, or edema bilateral lower extremities Skin: Negative rashes, lesions, ulcers Psychiatric:  Unable to assess  Central nervous system:  Withdraws to painful stimuli unable to assess further secondary to sedation.     Data Reviewed: Care during the described time interval was provided by me .  I have reviewed this patient's available data, including medical history, events of note, physical examination, and all test results as part of my evaluation.   CBC: Recent Labs  Lab 01/04/17 0607 01/05/17 0438 01/06/17 0510 01/07/17 0409 01/08/17 0352  WBC 4.9 6.0 5.7 6.5 6.3  HGB 7.3* 7.8* 7.6* 7.4* 7.3*  HCT 24.0* 25.2* 24.5* 24.1* 23.8*  MCV 99.6 97.7 99.6 98.8 98.8  PLT 61* 47* 42* 50* 48*   Basic Metabolic Panel: Recent Labs  Lab 01/04/17 0607 01/05/17 0438 01/06/17 0510 01/07/17 0409 01/08/17 0352  NA 140 138 138 136 138  K 3.5 3.4* 3.4* 3.9 3.9  CL 115* 110 109 108 109  CO2 21* 22 24 20* 24  GLUCOSE 79  85 82 90 103*  BUN 11 11 11 11 10   CREATININE 0.64 0.62 0.65 0.64 0.54*  CALCIUM 7.5* 7.7* 7.7* 7.5* 7.9*  MG  --  1.7 1.7 1.5* 1.6*   GFR: Estimated Creatinine Clearance: 109.2 mL/min (A) (by C-G formula based on SCr of 0.54 mg/dL (L)). Liver Function Tests: Recent Labs  Lab 01/02/17 0827 01/04/17 0607  AST 32 21  ALT 16* 11*  ALKPHOS 82 71  BILITOT 2.2* 1.8*  PROT 4.8* 4.1*  ALBUMIN 2.0* 1.8*   No results for input(s): LIPASE, AMYLASE in the last 168 hours. No results for input(s): AMMONIA in the last 168 hours. Coagulation Profile: No results for input(s): INR, PROTIME in the last 168 hours. Cardiac Enzymes: No results for input(s): CKTOTAL, CKMB, CKMBINDEX, TROPONINI in the last 168 hours. BNP (last 3 results) No results for input(s): PROBNP in the last 8760 hours. HbA1C: No results for input(s): HGBA1C in the last 72 hours. CBG: No results for input(s): GLUCAP in the last 168 hours. Lipid Profile: No results for input(s): CHOL, HDL, LDLCALC, TRIG, CHOLHDL, LDLDIRECT in the last 72 hours. Thyroid Function Tests: No results for input(s): TSH, T4TOTAL, FREET4, T3FREE, THYROIDAB in the  last 72 hours. Anemia Panel: No results for input(s): VITAMINB12, FOLATE, FERRITIN, TIBC, IRON, RETICCTPCT in the last 72 hours. Urine analysis:    Component Value Date/Time   COLORURINE AMBER (A) 01/02/2017 0117   APPEARANCEUR CLEAR 01/02/2017 0117   LABSPEC 1.021 01/02/2017 0117   PHURINE 5.0 01/02/2017 0117   GLUCOSEU NEGATIVE 01/02/2017 0117   HGBUR NEGATIVE 01/02/2017 0117   BILIRUBINUR NEGATIVE 01/02/2017 0117   KETONESUR 5 (A) 01/02/2017 0117   PROTEINUR 100 (A) 01/02/2017 0117   NITRITE NEGATIVE 01/02/2017 0117   LEUKOCYTESUR NEGATIVE 01/02/2017 0117   Sepsis Labs: @LABRCNTIP (procalcitonin:4,lacticidven:4)  ) Recent Results (from the past 240 hour(s))  Blood Culture (routine x 2)     Status: Abnormal   Collection Time: 12/24/2016  3:26 PM  Result Value Ref Range Status    Specimen Description BLOOD LEFT FOREARM  Final   Special Requests   Final    BOTTLES DRAWN AEROBIC AND ANAEROBIC Blood Culture adequate volume   Culture  Setup Time   Final    GRAM POSITIVE COCCI IN BOTH AEROBIC AND ANAEROBIC BOTTLES CRITICAL RESULT CALLED TO, READ BACK BY AND VERIFIED WITH: N GLOGOVAC,PHARMD AT 0258 01/02/17 BY L BENFIELD    Culture (A)  Final    STAPHYLOCOCCUS SPECIES (COAGULASE NEGATIVE) THE SIGNIFICANCE OF ISOLATING THIS ORGANISM FROM A SINGLE SET OF BLOOD CULTURES WHEN MULTIPLE SETS ARE DRAWN IS UNCERTAIN. PLEASE NOTIFY THE MICROBIOLOGY DEPARTMENT WITHIN ONE WEEK IF SPECIATION AND SENSITIVITIES ARE REQUIRED. Performed at [facility], [place] [address] [place], [place] [address]    Report Status 01/04/2017 FINAL  Final  Blood Culture ID Panel (Reflexed)     Status: Abnormal   Collection Time: 12/23/2016  3:26 PM  Result Value Ref Range Status   Enterococcus species NOT DETECTED NOT DETECTED Final   Listeria monocytogenes NOT DETECTED NOT DETECTED Final   Staphylococcus species DETECTED (A) NOT DETECTED Final    Comment: Methicillin (oxacillin) resistant coagulase negative staphylococcus. Possible blood culture contaminant (unless isolated from more than one blood culture draw or clinical case suggests pathogenicity). No antibiotic treatment is indicated for blood  culture contaminants. CRITICAL RESULT CALLED TO, READ BACK BY AND VERIFIED WITH: N GLOGOVAC,PHARMD AT 1240 01/02/17 BY L BENFIELD    Staphylococcus aureus NOT DETECTED NOT DETECTED Final   Methicillin resistance DETECTED (A) NOT DETECTED Final    Comment: CRITICAL RESULT CALLED TO, READ BACK BY AND VERIFIED WITH: N GLOGOVAC,PHARMD AT 1240 01/02/17 BY L BENFIELD    Streptococcus species NOT DETECTED NOT DETECTED Final   Streptococcus agalactiae NOT DETECTED NOT DETECTED Final   Streptococcus pneumoniae NOT DETECTED NOT DETECTED Final   Streptococcus pyogenes NOT DETECTED NOT DETECTED Final    Acinetobacter baumannii NOT DETECTED NOT DETECTED Final   Enterobacteriaceae species NOT DETECTED NOT DETECTED Final   Enterobacter cloacae complex NOT DETECTED NOT DETECTED Final   Escherichia coli NOT DETECTED NOT DETECTED Final   Klebsiella oxytoca NOT DETECTED NOT DETECTED Final   Klebsiella pneumoniae NOT DETECTED NOT DETECTED Final   Proteus species NOT DETECTED NOT DETECTED Final   Serratia marcescens NOT DETECTED NOT DETECTED Final   Haemophilus influenzae NOT DETECTED NOT DETECTED Final   Neisseria meningitidis NOT DETECTED NOT DETECTED Final   Pseudomonas aeruginosa NOT DETECTED NOT DETECTED Final   Candida albicans NOT DETECTED NOT DETECTED Final   Candida glabrata NOT DETECTED NOT DETECTED Final   Candida krusei NOT DETECTED NOT DETECTED Final   Candida parapsilosis NOT DETECTED NOT DETECTED Final  Candida tropicalis NOT DETECTED NOT DETECTED Final    Comment: Performed at [facility], [place] [address], [place], [place] [address]  Blood Culture (routine x 2)     Status: None   Collection Time: 12/26/2016  3:57 PM  Result Value Ref Range Status   Specimen Description BLOOD LEFT FOREARM  Final   Special Requests   Final    BOTTLES DRAWN AEROBIC AND ANAEROBIC Blood Culture adequate volume   Culture   Final    NO GROWTH 5 DAYS Performed at [facility], [place] [address] [place], [place] [address]    Report Status 01/06/2017 FINAL  Final  Urine culture     Status: Abnormal   Collection Time: 01/02/17  1:18 AM  Result Value Ref Range Status   Specimen Description URINE, RANDOM  Final   Special Requests NONE  Final   Culture (A)  Final    <10,000 COLONIES/mL INSIGNIFICANT GROWTH Performed at [facility], [place] [address], [place], [place] [address]    Report Status 01/03/2017 FINAL  Final         Radiology Studies: No results found.      Scheduled Meds: . allopurinol  300 mg Oral Daily  . amiodarone  200 mg Oral Daily  . atorvastatin  20 mg  Oral q1800  . diltiazem  180 mg Oral Daily  . ferrous sulfate  325 mg Oral Q breakfast  . folic acid  1 mg Oral Daily  . metoprolol tartrate  37.5 mg Oral BID  . pantoprazole  40 mg Oral Q0600   Continuous Infusions: . sodium chloride 1,000 mL (01/09/17 0528)     LOS: 7 days    Time spent: 40 minutes    Isai Gottlieb, Geraldo Docker, MD [facility] Pager [phone]   If 7PM-7AM, please contact night-coverage [email or web address] Password [facility] 01/09/2017, 8:13 AM

## 2017-01-09 NOTE — Clinical Social Work Note (Signed)
Clinical Social Work Assessment  Patient Details  Name: [name] [name] MRN: [id number] Date of Birth: 1950/11/28  Date of referral:  01/07/17               Reason for consult:  Facility Placement                Permission sought to share information with:  Facility Art therapist granted to share information::  Yes, Verbal Permission Granted  Name::        Agency::  [facility]  Relationship::     Contact Information:     Housing/Transportation Living arrangements for the past 2 months:  [place] of Information:  Patient, Facility Patient Interpreter Needed:  None Criminal Activity/Legal Involvement Pertinent to Current Situation/Hospitalization:  No - Comment as needed Significant Relationships:  Other Family Members Lives with:  Facility Resident Do you feel safe going back to the place where you live?  Yes Need for family participation in patient care:  No (Coment)  Care giving concerns:  Pt is a resident in long term care at [facility]. Pt indicated that he would desire to return to nursing facility. CSW spoke with admission staff [name] and confirmed same.    Social Worker assessment / plan:  CSW will update nursing facility on disposition and set up transport when patient medically ready.  Employment status:  Disabled (Comment on whether or not currently receiving Disability) Insurance information:  Medicare PT Recommendations:  [place] / Referral to community resources:  [place]  Patient/Family's Response to care:  Patient appreciative of CSW meeting with him and discussing his transition back to [employer]. No other issues or concerns identified.  Patient/Family's Understanding of and Emotional Response to Diagnosis, Current Treatment, and Prognosis:  Patient has good understanding of his impairment as he acknowledges he needs to return to nursing facility for continued care. CSW  validated his concerns for his health and encouraged his continued advocacy for his needs. No other issues or concerns identified.  Emotional Assessment Appearance:  Appears older than stated age Attitude/Demeanor/Rapport:  (Cooperative) Affect (typically observed):    Orientation:  Oriented to Self, Oriented to Situation, Oriented to Place, Oriented to  Time Alcohol / Substance use:  Not Applicable Psych involvement (Current and /or in the community):  No (Comment)  Discharge Needs  Concerns to be addressed:  Discharge Planning Concerns Readmission within the last 30 days:  Yes Current discharge risk:  Physical Impairment, Dependent with Mobility Barriers to Discharge:  No Barriers Identified   Normajean Baxter, LCSW 01/09/2017, 8:42 AM

## 2017-01-10 DIAGNOSIS — R591 Generalized enlarged lymph nodes: Secondary | ICD-10-CM

## 2017-01-10 DIAGNOSIS — E7849 Other hyperlipidemia: Secondary | ICD-10-CM

## 2017-01-10 LAB — BASIC METABOLIC PANEL
Anion gap: 8 (ref 5–15)
BUN: 15 mg/dL (ref 6–20)
CALCIUM: 8 mg/dL — AB (ref 8.9–10.3)
CO2: 24 mmol/L (ref 22–32)
CREATININE: 0.7 mg/dL (ref 0.61–1.24)
Chloride: 107 mmol/L (ref 101–111)
GFR calc Af Amer: >60 mL/min (ref >60)
GLUCOSE: 88 mg/dL (ref 65–99)
Potassium: 4.4 mmol/L (ref 3.5–5.1)
Sodium: 139 mmol/L (ref 135–145)

## 2017-01-10 LAB — CBC
HCT: 27.2 % — ABNORMAL LOW (ref 39.0–52.0)
HEMOGLOBIN: 8.2 g/dL — AB (ref 13.0–17.0)
MCH: 29.7 pg (ref 26.0–34.0)
MCHC: 30.1 g/dL (ref 30.0–36.0)
MCV: 98.6 fL (ref 78.0–100.0)
PLATELETS: 60 10*3/uL — AB (ref 150–400)
RBC: 2.76 MIL/uL — ABNORMAL LOW (ref 4.22–5.81)
RDW: 19.6 % — AB (ref 11.5–15.5)
WBC: 7.8 10*3/uL (ref 4.0–10.5)

## 2017-01-10 LAB — MAGNESIUM: MAGNESIUM: 2.1 mg/dL (ref 1.7–2.4)

## 2017-01-10 MED ORDER — ASPIRIN EC 81 MG PO TBEC
81.0000 mg | DELAYED_RELEASE_TABLET | Freq: Every day | ORAL | Status: DC
  Administered 2017-01-10 – 2017-01-11 (×2): 81 mg via ORAL
  Filled 2017-01-10 (×2): qty 1

## 2017-01-10 NOTE — Progress Notes (Signed)
PROGRESS NOTE    [name] [name]  BTD:[id number] DOB: 09-22-1950 DOA: 12/25/2016 PCP: Wenda Low, MD   Brief Narrative:  66 y.o. 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 Pt also reported he had a fall the day prior in which he hit his head. He was found to be dehydrated, anemic, thrombocytopenic, tachycardic and in acute renal failure.      Subjective: 12/29  A/O 4, still confused. Follows commands. Negative CP, negative SOB, negative abdominal pain.   Assessment & Plan:   Active Problems:   Essential hypertension   Hyperlipidemia   Atrial fibrillation with RVR (HCC)   Normocytic anemia   Lymphadenopathy   Mantle cell lymphoma (HCC)   Hypotension   AKI (acute kidney injury) ([place])  Hypotension with near syncope -Most likely multifactorial acute stroke, anemia, orthostatic hypotension, dehydration from chemotherapy.  -Resolved   Anemia of chronic disease secondary to malignancy and chemotherapy? 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 Rory Percy Neurology. 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 Jana Hakim oncology; continue  Rituximab -He would be an excellent candidate for ibrutinib but this can lead to bruising/bleeding and can worsen Afib. For now we are working to increase the rituximab dose and can repeat prednisone next week.  -prior to discharge on 12/29 discussed case with on-call oncologist, discharge medication? Follow-up time next week?  Acute kidney injury  -prerenal in etiology  Recent Labs  Lab 01/04/17 0607 01/05/17 0438 01/06/17 0510 01/07/17 0409 01/08/17 0352 01/09/17 0854  CREATININE 0.64 0.62 0.65 0.64 0.54* 0.57*  -Resolved-  Thrombocytopenia(baseline platelets WNL in November 2018)  -Secondary to stress reaction, malignancy, chemotherapy -stable however has not recovered to November baseline.  -No overt signs of bleeding    HLD  -Lipitor 20 mg daily    Chronic Diastolic CHF -Strict in and out since admission -386.41ml -Daily weight Filed Weights   01/06/17 0533 01/08/17 0500 01/10/17 0542  Weight: 211 lb 3.2 oz (95.8 kg) 211 lb 10.3 oz (96 kg) 215 lb 9.8 oz (97.8 kg)  -Future hemoglobin<   8 -S/P 2U PRBC transfusion  -12/23 Transfuse 1 unit PRBC -12/27 transfuse 1 unit PRBC  Chronic Atrial Fibrillation -Currently NSR -Anticoagulation on hold secondary to severe anemia - amiodarone 200 mg daily -Diltiazem 180 mg daily  - metoprolol 37.5 mg BID  Pulmonary HTN -See CHF    Right upper extremity swelling chronic  - possibly from lymph node involvement from mantle cell lymphoma.  Hypomagnesia -Magnesium> 2   DVT prophylaxis: SCD Code Status: Full Family Communication: None Disposition Plan: DVD   Consultants:  Oncology Neurology  Procedures/Significant Events:     I have personally reviewed and interpreted all radiology studies and my findings are as  above.  VENTILATOR SETTINGS:    Cultures 12/21/2 blood positive coag negative staph (most likely contaminant 12/21 urine positive insignificant growth     Antimicrobials: Antibiotics Given (last  72 hours)    None       Devices    LINES / TUBES:      Continuous Infusions: . sodium chloride 1,000 mL (01/09/17 0528)     Objective: Vitals:   01/09/17 2011 01/09/17 2200 01/10/17 0113 01/10/17 0542  BP: (!) 146/81  (!) 145/82 (!) 142/79  Pulse:   (!) 106 99  Resp: (!) 36 (!) 24 (!) 22 (!) 22  Temp: (!) 101.5 F (38.6 C) 99.5 F (37.5 C) 99.4 F (37.4 C) 99 F (37.2 C)  TempSrc: Axillary Oral Axillary Axillary  SpO2: 90%  94% 95%  Weight:    215 lb 9.8 oz (97.8 kg)  Height:        Intake/Output Summary (Last 24 hours) at 01/10/2017 0655 Last data filed at 01/10/2017 0544 Gross per 24 hour  Intake 240 ml  Output -  Net 240 ml   Filed Weights   01/06/17 0533 01/08/17 0500 01/10/17 0542  Weight: 211 lb 3.2 oz (95.8 kg) 211 lb 10.3 oz (96 kg) 215 lb 9.8 oz (97.8 kg)    Physical Exam:  General: A/O 4, No acute respiratory distress Neck:  Negative scars, masses, torticollis, lymphadenopathy, JVD Lungs: Clear to auscultation bilaterally without wheezes or crackles Cardiovascular: Regular rate and rhythm without murmur gallop or rub normal S1 and  S2 Abdomen: Morbidly obese, negative abdominal pain, nondistended, positive soft, bowel sounds, no  Extremities: No significant cyanosis, clubbing, or edema bilateral lower extremities. Right upper extremity biceps swelling (secondary to lymphoma) Skin: Negative rashes, lesions, ulcers Psychiatric:  Negative depression, negative anxiety, negative fatigue, negative mania  Central nervous system:  Cranial nerves II through XII intact, tongue/uvula midline, all extremities muscle strength 5/5, sensation intact throughout, negative dysarthria, negative expressive aphasia, negative receptive aphasia.     Data Reviewed: Care during the described time interval was provided by me .  I have reviewed this patient's available data, including medical history, events of note, physical examination, and all test results as part of  my evaluation.   CBC: Recent Labs  Lab 01/05/17 0438 01/06/17 0510 01/07/17 0409 01/08/17 0352 01/09/17 0854  WBC 6.0 5.7 6.5 6.3 6.7  HGB 7.8* 7.6* 7.4* 7.3* 8.2*  HCT 25.2* 24.5* 24.1* 23.8* 25.9*  MCV 97.7 99.6 98.8 98.8 97.7  PLT 47* 42* 50* 48* 53*   Basic Metabolic Panel: Recent Labs  Lab 01/05/17 0438 01/06/17 0510 01/07/17 0409 01/08/17 0352 01/09/17 0854 01/10/17 0411  NA 138 138 136 138 138  --   K 3.4* 3.4* 3.9 3.9 3.8  --   CL 110 109 108 109 106  --   CO2 22 24 20* 24 23  --   GLUCOSE 85 82 90 103* 109*  --   BUN 11 11 11 10 11   --   CREATININE 0.62 0.65 0.64 0.54* 0.57*  --   CALCIUM 7.7* 7.7* 7.5* 7.9* 7.8*  --   MG 1.7 1.7 1.5* 1.6* 1.9 2.1   GFR: Estimated Creatinine Clearance: 110.1 mL/min (A) (by C-G formula based on SCr of 0.57 mg/dL (L)). Liver Function Tests: Recent Labs  Lab 01/04/17 0607  AST 21  ALT 11*  ALKPHOS 71  BILITOT 1.8*  PROT 4.1*  ALBUMIN 1.8*   No results for input(s): LIPASE, AMYLASE in the last 168  hours. No results for input(s): AMMONIA in the last 168 hours. Coagulation Profile: No results for input(s): INR, PROTIME in the last 168 hours. Cardiac Enzymes: No results for input(s): CKTOTAL, CKMB, CKMBINDEX, TROPONINI in the last 168 hours. BNP (last 3 results) No results for input(s): PROBNP in the last 8760 hours. HbA1C: No results for input(s): HGBA1C in the last 72 hours. CBG: No results for input(s): GLUCAP in the last 168 hours. Lipid Profile: No results for input(s): CHOL, HDL, LDLCALC, TRIG, CHOLHDL, LDLDIRECT in the last 72 hours. Thyroid Function Tests: No results for input(s): TSH, T4TOTAL, FREET4, T3FREE, THYROIDAB in the last 72 hours. Anemia Panel: No results for input(s): VITAMINB12, FOLATE, FERRITIN, TIBC, IRON, RETICCTPCT in the last 72 hours. Urine analysis:    Component Value Date/Time   COLORURINE AMBER (A) 01/02/2017 0117   APPEARANCEUR CLEAR 01/02/2017 0117   LABSPEC 1.021 01/02/2017 0117    PHURINE 5.0 01/02/2017 0117   GLUCOSEU NEGATIVE 01/02/2017 0117   HGBUR NEGATIVE 01/02/2017 0117   BILIRUBINUR NEGATIVE 01/02/2017 0117   KETONESUR 5 (A) 01/02/2017 0117   PROTEINUR 100 (A) 01/02/2017 0117   NITRITE NEGATIVE 01/02/2017 0117   LEUKOCYTESUR NEGATIVE 01/02/2017 0117   Sepsis Labs: @LABRCNTIP (procalcitonin:4,lacticidven:4)  ) Recent Results (from the past 240 hour(s))  Blood Culture (routine x 2)     Status: Abnormal   Collection Time: 12/25/2016  3:26 PM  Result Value Ref Range Status   Specimen Description BLOOD LEFT FOREARM  Final   Special Requests   Final    BOTTLES DRAWN AEROBIC AND ANAEROBIC Blood Culture adequate volume   Culture  Setup Time   Final    GRAM POSITIVE COCCI IN BOTH AEROBIC AND ANAEROBIC BOTTLES CRITICAL RESULT CALLED TO, READ BACK BY AND VERIFIED WITH: N GLOGOVAC,PHARMD AT 8119 01/02/17 BY L BENFIELD    Culture (A)  Final    STAPHYLOCOCCUS SPECIES (COAGULASE NEGATIVE) THE SIGNIFICANCE OF ISOLATING THIS ORGANISM FROM A SINGLE SET OF BLOOD CULTURES WHEN MULTIPLE SETS ARE DRAWN IS UNCERTAIN. PLEASE NOTIFY THE MICROBIOLOGY DEPARTMENT WITHIN ONE WEEK IF SPECIATION AND SENSITIVITIES ARE REQUIRED. Performed at [facility], [place] [address] [place], [place] [address]    Report Status 01/04/2017 FINAL  Final  Blood Culture ID Panel (Reflexed)     Status: Abnormal   Collection Time: 01/06/2017  3:26 PM  Result Value Ref Range Status   Enterococcus species NOT DETECTED NOT DETECTED Final   Listeria monocytogenes NOT DETECTED NOT DETECTED Final   Staphylococcus species DETECTED (A) NOT DETECTED Final    Comment: Methicillin (oxacillin) resistant coagulase negative staphylococcus. Possible blood culture contaminant (unless isolated from more than one blood culture draw or clinical case suggests pathogenicity). No antibiotic treatment is indicated for blood  culture contaminants. CRITICAL RESULT CALLED TO, READ BACK BY AND VERIFIED WITH: N  GLOGOVAC,PHARMD AT 1240 01/02/17 BY L BENFIELD    Staphylococcus aureus NOT DETECTED NOT DETECTED Final   Methicillin resistance DETECTED (A) NOT DETECTED Final    Comment: CRITICAL RESULT CALLED TO, READ BACK BY AND VERIFIED WITH: N GLOGOVAC,PHARMD AT 1240 01/02/17 BY L BENFIELD    Streptococcus species NOT DETECTED NOT DETECTED Final   Streptococcus agalactiae NOT DETECTED NOT DETECTED Final   Streptococcus pneumoniae NOT DETECTED NOT DETECTED Final   Streptococcus pyogenes NOT DETECTED NOT DETECTED Final   Acinetobacter baumannii NOT DETECTED NOT DETECTED Final   Enterobacteriaceae species NOT DETECTED NOT DETECTED Final   Enterobacter cloacae complex NOT DETECTED NOT DETECTED Final   Escherichia  coli NOT DETECTED NOT DETECTED Final   Klebsiella oxytoca NOT DETECTED NOT DETECTED Final   Klebsiella pneumoniae NOT DETECTED NOT DETECTED Final   Proteus species NOT DETECTED NOT DETECTED Final   Serratia marcescens NOT DETECTED NOT DETECTED Final   Haemophilus influenzae NOT DETECTED NOT DETECTED Final   Neisseria meningitidis NOT DETECTED NOT DETECTED Final   Pseudomonas aeruginosa NOT DETECTED NOT DETECTED Final   Candida albicans NOT DETECTED NOT DETECTED Final   Candida glabrata NOT DETECTED NOT DETECTED Final   Candida krusei NOT DETECTED NOT DETECTED Final   Candida parapsilosis NOT DETECTED NOT DETECTED Final   Candida tropicalis NOT DETECTED NOT DETECTED Final    Comment: Performed at [facility], [place] [address] [place], [place] [address]  Blood Culture (routine x 2)     Status: None   Collection Time: 01/10/2017  3:57 PM  Result Value Ref Range Status   Specimen Description BLOOD LEFT FOREARM  Final   Special Requests   Final    BOTTLES DRAWN AEROBIC AND ANAEROBIC Blood Culture adequate volume   Culture   Final    NO GROWTH 5 DAYS Performed at [facility], [place] [address], [place], [place] [address]    Report Status 01/06/2017 FINAL  Final  Urine culture      Status: Abnormal   Collection Time: 01/02/17  1:18 AM  Result Value Ref Range Status   Specimen Description URINE, RANDOM  Final   Special Requests NONE  Final   Culture (A)  Final    <10,000 COLONIES/mL INSIGNIFICANT GROWTH Performed at [facility], [place] [address], [place], [place] [address]    Report Status 01/03/2017 FINAL  Final         Radiology Studies: No results found.      Scheduled Meds: . allopurinol  300 mg Oral Daily  . amiodarone  200 mg Oral Daily  . atorvastatin  20 mg Oral q1800  . diltiazem  180 mg Oral Daily  . ferrous sulfate  325 mg Oral Q breakfast  . folic acid  1 mg Oral Daily  . metoprolol tartrate  37.5 mg Oral BID  . pantoprazole  40 mg Oral Q0600   Continuous Infusions: . sodium chloride 1,000 mL (01/09/17 0528)     LOS: 8 days    Time spent: 40 minutes    WOODS, Geraldo Docker, MD [facility] Pager [phone]   If 7PM-7AM, please contact night-coverage [email or web address] Password [facility] 01/10/2017, 6:55 AM

## 2017-01-11 ENCOUNTER — Inpatient Hospital Stay (HOSPITAL_COMMUNITY): Payer: Medicare Other

## 2017-01-11 LAB — BASIC METABOLIC PANEL
Anion gap: 8 (ref 5–15)
BUN: 12 mg/dL (ref 6–20)
CHLORIDE: 109 mmol/L (ref 101–111)
CO2: 24 mmol/L (ref 22–32)
CREATININE: 0.69 mg/dL (ref 0.61–1.24)
Calcium: 7.8 mg/dL — ABNORMAL LOW (ref 8.9–10.3)
GFR calc non Af Amer: >60 mL/min (ref >60)
GLUCOSE: 89 mg/dL (ref 65–99)
Potassium: 4.2 mmol/L (ref 3.5–5.1)
Sodium: 141 mmol/L (ref 135–145)

## 2017-01-11 LAB — CBC
HEMATOCRIT: 24.1 % — AB (ref 39.0–52.0)
HEMOGLOBIN: 7.3 g/dL — AB (ref 13.0–17.0)
MCH: 30.3 pg (ref 26.0–34.0)
MCHC: 30.3 g/dL (ref 30.0–36.0)
MCV: 100 fL (ref 78.0–100.0)
Platelets: 53 10*3/uL — ABNORMAL LOW (ref 150–400)
RBC: 2.41 MIL/uL — ABNORMAL LOW (ref 4.22–5.81)
RDW: 19.6 % — AB (ref 11.5–15.5)
WBC: 6.5 10*3/uL (ref 4.0–10.5)

## 2017-01-11 LAB — RETICULOCYTES
RBC.: 2.37 MIL/uL — ABNORMAL LOW (ref 4.22–5.81)
RETIC CT PCT: 6.3 % — AB (ref 0.4–3.1)
Retic Count, Absolute: 149.3 10*3/uL (ref 19.0–186.0)

## 2017-01-11 LAB — FOLATE: Folate: 18.4 ng/mL (ref >5.9)

## 2017-01-11 LAB — LACTATE DEHYDROGENASE: LDH: 220 U/L — ABNORMAL HIGH (ref 98–192)

## 2017-01-11 LAB — IRON AND TIBC
Iron: 29 ug/dL — ABNORMAL LOW (ref 45–182)
SATURATION RATIOS: 18 % (ref 17.9–39.5)
TIBC: 165 ug/dL — AB (ref 250–450)
UIBC: 136 ug/dL

## 2017-01-11 LAB — HEMOGLOBIN AND HEMATOCRIT, BLOOD
HCT: 24.1 % — ABNORMAL LOW (ref 39.0–52.0)
Hemoglobin: 7.3 g/dL — ABNORMAL LOW (ref 13.0–17.0)

## 2017-01-11 LAB — PREPARE RBC (CROSSMATCH)

## 2017-01-11 LAB — VITAMIN B12: VITAMIN B 12: 617 pg/mL (ref 180–914)

## 2017-01-11 LAB — MAGNESIUM: MAGNESIUM: 1.9 mg/dL (ref 1.7–2.4)

## 2017-01-11 LAB — FERRITIN: FERRITIN: 758 ng/mL — AB (ref 24–336)

## 2017-01-11 MED ORDER — SODIUM CHLORIDE 0.9 % IV SOLN
Freq: Once | INTRAVENOUS | Status: AC
  Administered 2017-01-11: 18:00:00 via INTRAVENOUS

## 2017-01-11 NOTE — Progress Notes (Signed)
Patient is very restless and confused,  Keeps yelling for help however whenever staff members are in the room patient is very calm and stops yelling   RN will continue to monitor patient

## 2017-01-11 NOTE — Progress Notes (Signed)
PROGRESS NOTE    [name] [name]  PXT:[id number] DOB: 19-Jan-1950 DOA: 01/05/2017 PCP: Wenda Low, MD   Brief Narrative:  66 y.o. 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 Pt also reported he had a fall the day prior in which he hit his head. He was found to be dehydrated, anemic, thrombocytopenic, tachycardic and in acute renal failure.      Subjective: 12/30 A/O x3 (does not know where), however when asked what hospital it is in was able to answer [facility].  Negative CP, negative as OB, negative abdominal pain.  Complains of back pain request better bed.     Assessment & Plan:   Active Problems:   Essential hypertension   Hyperlipidemia   Atrial fibrillation with RVR (HCC)   Normocytic anemia   Lymphadenopathy   Mantle cell lymphoma (HCC)   Hypotension   AKI (acute kidney injury) ([place])  Hypotension with near syncope -Most likely multifactorial acute stroke, anemia, orthostatic hypotension, dehydration from chemotherapy.  -Resolved  Anemia of chronic disease secondary to malignancy and chemotherapy?/Acute blood loss anemia -Blood negative -Anemia panel pending -S/P 2U PRBC transfusion  -12/23 transfuse 1 unit PRBC -Per review of EMR refused EGD during recent hospitalization Recent Labs  Lab 01/07/17 0409 01/08/17 0352 01/09/17 0854 01/10/17 1153 01/11/17 0425 01/11/17 1508  HGB 7.4* 7.3* 8.2* 8.2* 7.3* 7.3*  -12/30 repeat H/H shows 1 unit drop -CT abdomen and pelvis retroperitoneal bleed  Acute Small Infarct Left Cerebral Hemisphere stroke -Neurology directing complete CVA workup -Lipitor, avoid antiplatelet agents due to thrombocytopenia and anemia requiring transfusion  -Stroke team signed off on 12/22  -12/29 discussed case with Dr. Rory Percy Neurology. Concurs that if  patient's hemoglobin stable restart patient on ASA 81 mg daily aspirin.  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 Jana Hakim oncology; continue Rituximab -He would be an excellent candidate for ibrutinib but this can lead to bruising/bleeding and can worsen Afib. For now we are working to increase the rituximab dose and can repeat prednisone next week.  -prior to discharge discusse case with on-call oncologist, discharge medication? Follow-up time next week?  Acute kidney injury  -prerenal in etiology  Recent Labs  Lab 01/06/17 0510 01/07/17 0409 01/08/17 0352 01/09/17 0854 01/10/17 1153 01/11/17 0425  CREATININE 0.65 0.64 0.54* 0.57* 0.70 0.69  -Resolved-  Thrombocytopenia(baseline platelets WNL in November 2018)  -Secondary to stress reaction, malignancy, chemotherapy -stable however has not recovered to November baseline.  -No overt signs of bleeding    HLD  -Lipitor 20 mg daily    Chronic Diastolic CHF -Strict in and out since admission -386.85ml -Daily weight Filed Weights   01/08/17 0500 01/10/17 0542 01/11/17 0318  Weight: 211 lb 10.3 oz (96 kg) 215 lb 9.8 oz (97.8 kg) 213 lb 3 oz (96.7 kg)  -Future hemoglobin<   8 -S/P 2U PRBC transfusion  -12/23 Transfuse 1 unit PRBC -12/27 transfuse 1 unit PRBC -12/30 transfuse 1 unit PRBC  Chronic Atrial Fibrillation -Currently NSR -Anticoagulation on hold secondary to severe anemia  -Amiodarone 200 mg daily  -Diltiazem 180 mg daily  -Metoprolol 37.5 mg BID  Pulmonary HTN -See CHF  Right upper extremity swelling chronic/RIGHT neck swelling (chronic)  - possibly from lymph node involvement from mantle cell lymphoma.  Hypomagnesia -Magnesium> 2  Goals of care -12/30 PALLIATIVE CARE: Patient with extensive lymphoma, multiple medical problems address  CODE STATUS, short-term vs long-term goals of care   DVT prophylaxis: SCD Code Status: Full Family Communication: None Disposition Plan: DVD   Consultants:  Oncology Neurology  Procedures/Significant Events:     I have personally reviewed and interpreted all radiology studies and my findings are as above.  VENTILATOR SETTINGS:    Cultures 12/21/2 blood positive coag negative staph (most likely contaminant 12/21 urine positive insignificant growth     Antimicrobials: Antibiotics Given (last 72 hours)    None       Devices    LINES / TUBES:      Continuous Infusions: . sodium chloride 1,000 mL (01/09/17 0528)     Objective: Vitals:   01/11/17 0318 01/11/17 0541 01/11/17 0959 01/11/17 1429  BP:  (!) 135/49 120/66 128/62  Pulse:  (!) 108 (!) 120 (!) 110  Resp:  19 20 20   Temp:  98.4 F (36.9 C)  98.6 F (37 C)  TempSrc:  Oral  Oral  SpO2:  95% 97% 98%  Weight: 213 lb 3 oz (96.7 kg)     Height:        Intake/Output Summary (Last 24 hours) at 01/11/2017 1608 Last data filed at 01/11/2017 0550 Gross per 24 hour  Intake -  Output 800 ml  Net -800 ml   Filed Weights   01/08/17 0500 01/10/17 0542 01/11/17 0318  Weight: 211 lb 10.3 oz (96 kg) 215 lb 9.8 oz (97.8 kg) 213 lb 3 oz (96.7 kg)    Physical Exam:  General:A/O x3 (does not know where),, No acute respiratory distress Neck:  Negative scars, masses, torticollis, positive RIGHT neck lymphadenopathy, JVD Lungs: Clear to auscultation bilaterally without wheezes or crackles Cardiovascular: Regular rate and rhythm without murmur gallop or rub normal S1 and S2 Abdomen: Positive abdominal pain to palpation generalized, positive distention, positive soft, bowel sounds, no rebound, no ascites, no appreciable mass Extremities: No significant cyanosis, clubbing, or edema bilateral lower extremities Skin: Negative rashes, lesions, ulcers Psychiatric:  Negative depression, negative anxiety, negative  fatigue, negative mania  Central nervous system:  Cranial nerves II through XII intact, tongue/uvula midline, all extremities muscle strength 5/5, sensation intact throughout,negative dysarthria, negative expressive aphasia, negative receptive aphasia.    Data Reviewed: Care during the described time interval was provided by me .  I have reviewed this patient's available data, including medical history, events of note, physical examination, and all test results as part of my evaluation.   CBC: Recent Labs  Lab 01/07/17 0409 01/08/17 0352 01/09/17 0854 01/10/17 1153 01/11/17 0425 01/11/17 1508  WBC 6.5 6.3 6.7 7.8 6.5  --   HGB 7.4* 7.3* 8.2* 8.2* 7.3* 7.3*  HCT 24.1* 23.8* 25.9* 27.2* 24.1* 24.1*  MCV 98.8 98.8 97.7 98.6 100.0  --   PLT 50* 48* 53* 60* 53*  --    Basic Metabolic Panel: Recent Labs  Lab 01/07/17 0409 01/08/17 0352 01/09/17 0854 01/10/17 0411 01/10/17 1153 01/11/17 0425  NA 136 138 138  --  139 141  K 3.9 3.9 3.8  --  4.4 4.2  CL 108 109 106  --  107 109  CO2 20* 24 23  --  24 24  GLUCOSE 90 103* 109*  --  88 89  BUN 11 10 11   --  15 12  CREATININE 0.64 0.54* 0.57*  --  0.70 0.69  CALCIUM 7.5* 7.9* 7.8*  --  8.0* 7.8*  MG 1.5* 1.6* 1.9 2.1  --  1.9   GFR: Estimated Creatinine Clearance: 109.5 mL/min (by C-G formula based on SCr of 0.69 mg/dL). Liver Function Tests: No results for input(s): AST, ALT, ALKPHOS, BILITOT, PROT, ALBUMIN in the last 168 hours. No results for input(s): LIPASE, AMYLASE in the last 168 hours. No results for input(s): AMMONIA in the last 168 hours. Coagulation Profile: No results for input(s): INR, PROTIME in the last 168 hours. Cardiac Enzymes: No results for input(s): CKTOTAL, CKMB, CKMBINDEX, TROPONINI in the last 168 hours. BNP (last 3 results) No results for input(s): PROBNP in the last 8760 hours. HbA1C: No results for input(s): HGBA1C in the last 72 hours. CBG: No results for input(s): GLUCAP in the last 168  hours. Lipid Profile: No results for input(s): CHOL, HDL, LDLCALC, TRIG, CHOLHDL, LDLDIRECT in the last 72 hours. Thyroid Function Tests: No results for input(s): TSH, T4TOTAL, FREET4, T3FREE, THYROIDAB in the last 72 hours. Anemia Panel: Recent Labs    01/11/17 1508  RETICCTPCT 6.3*   Urine analysis:    Component Value Date/Time   COLORURINE AMBER (A) 01/02/2017 0117   APPEARANCEUR CLEAR 01/02/2017 0117   LABSPEC 1.021 01/02/2017 0117   PHURINE 5.0 01/02/2017 0117   GLUCOSEU NEGATIVE 01/02/2017 0117   HGBUR NEGATIVE 01/02/2017 0117   BILIRUBINUR NEGATIVE 01/02/2017 0117   KETONESUR 5 (A) 01/02/2017 0117   PROTEINUR 100 (A) 01/02/2017 0117   NITRITE NEGATIVE 01/02/2017 0117   LEUKOCYTESUR NEGATIVE 01/02/2017 0117   Sepsis Labs: @LABRCNTIP (procalcitonin:4,lacticidven:4)  ) Recent Results (from the past 240 hour(s))  Urine culture     Status: Abnormal   Collection Time: 01/02/17  1:18 AM  Result Value Ref Range Status   Specimen Description URINE, RANDOM  Final   Special Requests NONE  Final   Culture (A)  Final    <10,000 COLONIES/mL INSIGNIFICANT GROWTH Performed at [facility], [place] [address], [place], [place] [address]    Report Status 01/03/2017 FINAL  Final         Radiology Studies: No results found.      Scheduled Meds: . allopurinol  300 mg Oral Daily  . amiodarone  200 mg Oral Daily  . aspirin EC  81 mg Oral Daily  . atorvastatin  20 mg Oral q1800  . diltiazem  180 mg Oral Daily  . ferrous sulfate  325 mg Oral Q breakfast  . folic acid  1 mg Oral Daily  . metoprolol tartrate  37.5 mg Oral BID  . pantoprazole  40 mg Oral Q0600   Continuous Infusions: . sodium chloride 1,000 mL (01/09/17 0528)     LOS: 9 days    Time spent: 40 minutes    Zakyria Metzinger, Geraldo Docker, MD [facility] Pager [phone]   If 7PM-7AM, please contact night-coverage [email or web address] Password [email or web address] 01/11/2017, 4:08 PM

## 2017-01-12 DIAGNOSIS — F411 Generalized anxiety disorder: Secondary | ICD-10-CM

## 2017-01-12 DIAGNOSIS — Z66 Do not resuscitate: Secondary | ICD-10-CM

## 2017-01-12 DIAGNOSIS — Z515 Encounter for palliative care: Secondary | ICD-10-CM

## 2017-01-12 LAB — CBC
HCT: 26.6 % — ABNORMAL LOW (ref 39.0–52.0)
HEMOGLOBIN: 8.1 g/dL — AB (ref 13.0–17.0)
MCH: 30 pg (ref 26.0–34.0)
MCHC: 30.5 g/dL (ref 30.0–36.0)
MCV: 98.5 fL (ref 78.0–100.0)
PLATELETS: 44 10*3/uL — AB (ref 150–400)
RBC: 2.7 MIL/uL — AB (ref 4.22–5.81)
RDW: 20.8 % — ABNORMAL HIGH (ref 11.5–15.5)
WBC: 6.9 10*3/uL (ref 4.0–10.5)

## 2017-01-12 LAB — BASIC METABOLIC PANEL
Anion gap: 8 (ref 5–15)
BUN: 11 mg/dL (ref 6–20)
CHLORIDE: 105 mmol/L (ref 101–111)
CO2: 26 mmol/L (ref 22–32)
CREATININE: 0.63 mg/dL (ref 0.61–1.24)
Calcium: 7.9 mg/dL — ABNORMAL LOW (ref 8.9–10.3)
Glucose, Bld: 80 mg/dL (ref 65–99)
Potassium: 3.9 mmol/L (ref 3.5–5.1)
SODIUM: 139 mmol/L (ref 135–145)

## 2017-01-12 LAB — TYPE AND SCREEN
ABO/RH(D): A POS
Antibody Screen: NEGATIVE
Unit division: 0

## 2017-01-12 LAB — HAPTOGLOBIN: HAPTOGLOBIN: 218 mg/dL — AB (ref 34–200)

## 2017-01-12 LAB — BPAM RBC
BLOOD PRODUCT EXPIRATION DATE: 201901232359
ISSUE DATE / TIME: 201812301807
Unit Type and Rh: 6200

## 2017-01-12 LAB — MAGNESIUM: MAGNESIUM: 1.9 mg/dL (ref 1.7–2.4)

## 2017-01-12 MED ORDER — MORPHINE SULFATE (PF) 2 MG/ML IV SOLN
2.0000 mg | Freq: Once | INTRAVENOUS | Status: AC
  Administered 2017-01-12: 2 mg via INTRAVENOUS
  Filled 2017-01-12: qty 1

## 2017-01-12 MED ORDER — QUETIAPINE FUMARATE 25 MG PO TABS
25.0000 mg | ORAL_TABLET | Freq: Every day | ORAL | Status: DC
  Filled 2017-01-12: qty 1

## 2017-01-12 MED ORDER — METOPROLOL TARTRATE 5 MG/5ML IV SOLN
2.5000 mg | Freq: Four times a day (QID) | INTRAVENOUS | Status: DC | PRN
  Administered 2017-01-13 – 2017-01-14 (×2): 2.5 mg via INTRAVENOUS
  Filled 2017-01-12 (×4): qty 5

## 2017-01-12 MED ORDER — QUETIAPINE FUMARATE 50 MG PO TABS
50.0000 mg | ORAL_TABLET | Freq: Every morning | ORAL | Status: DC
  Administered 2017-01-12: 50 mg via ORAL
  Filled 2017-01-12: qty 1

## 2017-01-12 NOTE — Consult Note (Signed)
Consultation Note Date: 01/12/2017   Patient Name: [name] [name]  DOB: [birth year]  MRN: [id number]  Age / Sex: 66 y.o., male  PCP: Wenda Low, MD Referring Physician: [name], MD  Reason for Consultation: Establishing goals of care, Non pain symptom management and Psychosocial/spiritual support  HPI/Patient Profile: 66 y.o. 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  Reports a fall yesterday and his facility  Patient has been a resident of [facility] for 8 years, has no living relatives.  Contact listed in his chart, was unable to be contacted today.  I contacted the facility where he has been residing for many years.  Nursing tells me that his anxiety has been a main issue since his cancer diagnosis.  Clinical Assessment and Goals of Care:   This NP Wadie Lessen reviewed medical records, received report from team, assessed the patient and then meet at the patient's bedside   to discuss diagnosis, prognosis, GOC, EOL wishes disposition and options.  Patient currently is alert and oriented x3, is able to speak to his cancer diagnosis.  A  discussion was had today regarding advanced directives.  Concepts specific to code status was had; patient is able to verbalize that if his heart stops and his lungs stopped comfort is his priority.  This NP will document DNR status  Values and goals of care important to patient  were attempted to be elicited.  Questions and concerns addressed.   Family encouraged to call with questions or concerns.  PMT will continue to support holistically.  There is no documented advanced  directive or HPOA.  Patient has no living relatives.    SUMMARY OF RECOMMENDATIONS    Code Status/Advance Care Planning:  DNR   Symptom Management:   Anxiety: Seroquel 50 mg daily and Seroquel 25 mg po qhs  Palliative Prophylaxis:   Aspiration, Bowel Regimen, Delirium Protocol, Frequent Pain Assessment and Oral Care  Additional Recommendations (Limitations, Scope, Preferences):  Full Scope Treatment  Psycho-social/Spiritual:   Desire for further Chaplaincy support:yes   Prognosis:   Unable to determine  Discharge Planning: To Be Determined      Primary Diagnoses: Present on Admission: . Hypotension . Normocytic anemia . Mantle cell lymphoma ([place]) . Lymphadenopathy . Hyperlipidemia . Essential hypertension . Atrial fibrillation with RVR ([place])   I have reviewed the medical record, interviewed the patient and family, and examined the patient. The following aspects are pertinent.  Past Medical History:  Diagnosis Date  . Bladder tumor   . Cataract   . CVA (cerebral infarction)    2008  . Hyperlipidemia   . Hypertension    Social History   Socioeconomic History  . Marital status: Single    Spouse name: None  . Number of children: None  .  Years of education: None  . Highest education level: None  Social Needs  . Financial resource strain: None  . Food insecurity - worry: None  . Food insecurity - inability: None  . Transportation needs - medical: None  . Transportation needs - non-medical: None  Occupational History  . Occupation: disabled  Tobacco Use  . Smoking status: Former Smoker    Last attempt to quit: 09/15/1985    Years since quitting: 31.3  . Smokeless tobacco: Never Used  . Tobacco comment: Started at age 24, quit in the 1980's  Substance and Sexual Activity  . Alcohol use: No  . Drug use: No  . Sexual activity: None  Other Topics Concern  . None  Social History Narrative  . None   Family History  Problem Relation Age of Onset   . Colon cancer Father   . Colon cancer Sister 1  . Hypertension Sister    Scheduled Meds: . allopurinol  300 mg Oral Daily  . amiodarone  200 mg Oral Daily  . atorvastatin  20 mg Oral q1800  . diltiazem  180 mg Oral Daily  . ferrous sulfate  325 mg Oral Q breakfast  . folic acid  1 mg Oral Daily  . metoprolol tartrate  37.5 mg Oral BID  . pantoprazole  40 mg Oral Q0600   Continuous Infusions: . sodium chloride 25 mL/hr at 01/11/17 2129   PRN Meds:.acetaminophen, LORazepam, ondansetron **OR** ondansetron (ZOFRAN) IV Medications Prior to Admission:  Prior to Admission medications   Medication Sig Start Date End Date Taking? Authorizing Provider  acetaminophen (TYLENOL) 500 MG tablet Take 500 mg by mouth every 6 (six) hours as needed.   Yes [provider]  acetaminophen (TYLENOL) 650 MG CR tablet Take 650 mg every 8 (eight) hours as needed by mouth (for pain or fever ("for 48 hours")).   Yes [provider]  allopurinol (ZYLOPRIM) 300 MG tablet Take 1 tablet (300 mg total) by mouth daily. 12/07/16  Yes Samuella Cota, MD  amiodarone (PACERONE) 200 MG tablet Take 2 tablets (400 mg total) by mouth 2 (two) times daily for 4 days, THEN 1 tablet (200 mg total) daily. Patient taking differently: Take 1 Tablet (200 mg) by mouth daily 12/06/16 01/09/17 Yes Samuella Cota, MD  aspirin 81 MG chewable tablet Chew 1 tablet (81 mg total) by mouth daily. 12/07/16  Yes Samuella Cota, MD  Cholecalciferol (VITAMIN D-3) 1000 units CAPS Take 1,000 Units by mouth daily.    Yes [provider]  diltiazem (CARDIZEM CD) 240 MG 24 hr capsule Take 1 capsule (240 mg total) by mouth daily. 12/07/16  Yes Samuella Cota, MD  docusate sodium (COLACE) 100 MG capsule Take 100 mg by mouth 2 (two) times daily. For constipation   Yes [provider]  ferrous sulfate 325 (65 FE) MG tablet Take 325 mg by mouth daily with breakfast.    Yes [provider]    folic acid (FOLVITE) 1 MG tablet Take 1 tablet (1 mg total) by mouth daily. 12/15/16  Yes Florencia Reasons, MD  ibuprofen (ADVIL,MOTRIN) 400 MG tablet Take 400 mg by mouth every 6 (six) hours as needed for moderate pain.  10/09/16  Yes [provider]  lisinopril (ZESTRIL) 2.5 MG tablet Take 1 tablet (2.5 mg total) by mouth daily. 12/14/16 12/14/17 Yes Florencia Reasons, MD  loratadine (CLARITIN) 10 MG tablet Take 10 mg daily by mouth.   Yes [provider]  metoprolol tartrate (  LOPRESSOR) 50 MG tablet Take 1 tablet (50 mg total) by mouth 2 (two) times daily. 12/06/16  Yes Samuella Cota, MD  mupirocin ointment (BACTROBAN) 2 % Place 1 application into the nose 2 (two) times daily. 12/14/16  Yes Florencia Reasons, MD  omeprazole (PRILOSEC) 20 MG capsule Take 1 capsule by mouth daily. 09/17/13  Yes [provider]  simvastatin (ZOCOR) 40 MG tablet Take 40 mg by mouth at bedtime.   Yes [provider]  Chlorhexidine Gluconate Cloth 2 % PADS Apply 6 each topically daily at 6 (six) AM. Patient not taking: Reported on 12/28/2016 12/15/16   Florencia Reasons, MD   No Known Allergies Review of Systems  Constitutional:       Patient verbalizes feelings of "spikes" in the left side of his face  Otherwise all systems negative    Physical Exam  Constitutional: He is oriented to person, place, and time. He appears well-developed. He appears ill.  Cardiovascular: Tachycardia present.  Pulmonary/Chest: Effort normal and breath sounds normal.  Lymphadenopathy:    He has cervical adenopathy.       Right cervical: Superficial cervical and deep cervical adenopathy present.       Left cervical: Superficial cervical and deep cervical adenopathy present.  Neurological: He is alert and oriented to person, place, and time.    Vital Signs: BP (!) 130/52 (BP Location: Left Leg)   Pulse (!) 111   Temp 98.6 F (37 C) (Oral)   Resp 16   Ht 6' (1.829 m)   Wt 100.3 kg (221 lb 1.9 oz)   SpO2 95%   BMI 29.99  kg/m  Pain Assessment: No/denies pain POSS *See Group Information*: S-Acceptable,Sleep, easy to arouse Pain Score: 3    SpO2: SpO2: 95 % O2 Device:SpO2: 95 % O2 Flow Rate: .O2 Flow Rate (L/min): 3 L/min  IO: Intake/output summary:   Intake/Output Summary (Last 24 hours) at 01/12/2017 1018 Last data filed at 01/11/2017 2121 Gross per 24 hour  Intake 735 ml  Output -  Net 735 ml    LBM: Last BM Date: 01/09/17 Baseline Weight: Weight: 84.4 kg (186 lb 1.1 oz) Most recent weight: Weight: 100.3 kg (221 lb 1.9 oz)     Palliative Assessment/Data: 30% at best   Discussed with Dr Thereasa Solo  Time In: 0830 Time Out: 0915 Time Total: 75 min Greater than 50%  of this time was spent counseling and coordinating care related to the above assessment and plan.  Signed by: Wadie Lessen, NP   Please contact Palliative Medicine Team phone at [phone] for questions and concerns.  For individual provider: See Shea Evans

## 2017-01-12 NOTE — Progress Notes (Signed)
Physical Therapy Treatment Patient Details Name: [name] [name] MRN: [id number] DOB: [birth year] Today's Date: 01/12/2017    History of Present Illness Patient is a 66 y/o male who presents from cancer ca with hypotension, generalized weakness and dizziness with recent fall at SNF. Hgb 6.6 s/p transfusions. MRI- acute left cerebellar infarct. PMH includes HTN, HLD, CKD, CHF, A-fib, prior CVA in 2008, non-Hodgkin's mantle cell lymphoma     PT Comments    Pt demonstrates decline in progress today, limited by lethargy and confusion during therapy session. Pt tolerated one sit-to-stand from EOB with mod physical assist +2 and RW. Pt does not stand completely upright, even after verbal and tactile cueing. Pt stands for ~ 10 seconds. Pt makes comments unrelated to therapy throughout session and needs constant re-direction.  Current discharge plan remains appropriate. PT will follow acutely to maximize mobility while in hospital setting.    Follow Up Recommendations  SNF;Supervision/Assistance - 24 hour     Equipment Recommendations  None recommended by PT    Recommendations for Other Services       Precautions / Restrictions Precautions Precautions: Fall Restrictions Weight Bearing Restrictions: No    Mobility  Bed Mobility Overal bed mobility: Needs Assistance Bed Mobility: Rolling;Sidelying to Sit;Sit to Supine Rolling: Min assist;+2 for physical assistance Sidelying to sit: HOB elevated;Mod assist;+2 for physical assistance   Sit to supine: Mod assist;+2 for physical assistance;HOB elevated   General bed mobility comments: VCs to reach for bed handrail to assist in rolling. Pt does not initiate sidelying to sit after being prompted and therapists had to physically initiate movement for pt. Pt able to assist somewhat and grabs onto therapists arm to pull himself up to sitting. During sit to supine, +1 for trunk control descent and +1 for scooping legs up onto bed.    Transfers Overall transfer level: Needs assistance Equipment used: Rolling walker (2 wheeled) Transfers: Sit to/from Stand Sit to Stand: Mod assist;+2 physical assistance         General transfer comment: STS x1 from EOB. Pt stands for ~10 seconds with flexed trunk and looking at floor, despite cues to stand up straight.   Ambulation/Gait                 Stairs            Wheelchair Mobility    Modified Rankin (Stroke Patients Only) Modified Rankin (Stroke Patients Only) Pre-Morbid Rankin Score: Moderately severe disability Modified Rankin: Moderately severe disability     Balance Overall balance assessment: Needs assistance Sitting-balance support: Feet supported;Bilateral upper extremity supported Sitting balance-Leahy Scale: Poor Sitting balance - Comments: Pt sits EOB with min guard in roder to maintain sitting balance. Pt slumped and unable to sit upright, despite VC and TCs from therapist.      Standing balance-Leahy Scale: Poor Standing balance comment: Pt requiring mod physical assist +2 and RW to maintain static standing.                             Cognition Arousal/Alertness: Lethargic Behavior During Therapy: Anxious;WFL for tasks assessed/performed Overall Cognitive Status: No family/caregiver present to determine baseline cognitive functioning                                 General Comments: Pt not following commands to sit upright and lift head up, however responds with "i'm trying  mam". Pt constantly stating, "hold on a minute". Pt with inappropriate and random statements, for example mentions something about a "cookie jar". Pt requiring constant redirecting.       Exercises      General Comments General comments (skin integrity, edema, etc.): Pt continues to demonstrate increased swelling of LLE as compared to right. Also, large baseball sized swelling at biceps on RUE. Nurse reports the RUE swelling has been  consistent all weekend.       Pertinent Vitals/Pain Pain Assessment: Faces Faces Pain Scale: Hurts little more Pain Location: unknown Pain Descriptors / Indicators: Grimacing Pain Intervention(s): Limited activity within patient's tolerance;Repositioned    Home Living                      Prior Function            PT Goals (current goals can now be found in the care plan section) Progress towards PT goals: Not progressing toward goals - comment(Pt lethargy/mental state limiting progress)    Frequency    Min 3X/week      PT Plan Current plan remains appropriate    Co-evaluation              AM-PAC PT "6 Clicks" Daily Activity  Outcome Measure  Difficulty turning over in bed (including adjusting bedclothes, sheets and blankets)?: Unable Difficulty moving from lying on back to sitting on the side of the bed? : Unable Difficulty sitting down on and standing up from a chair with arms (e.g., wheelchair, bedside commode, etc,.)?: Unable Help needed moving to and from a bed to chair (including a wheelchair)?: A Lot Help needed walking in hospital room?: A Lot Help needed climbing 3-5 steps with a railing? : Total 6 Click Score: 8    End of Session Equipment Utilized During Treatment: Oxygen;Gait belt Activity Tolerance: Patient limited by lethargy Patient left: in bed;with call bell/phone within reach;with bed alarm set   PT Visit Diagnosis: Dizziness and giddiness (R42);Muscle weakness (generalized) (M62.81);Difficulty in walking, not elsewhere classified (R26.2)     Time: 9563-8756 PT Time Calculation (min) (ACUTE ONLY): 20 min  Charges:  $Therapeutic Activity: 8-22 mins                    G Codes:       [name], SPT   [name] 01/12/2017, 4:02 PM

## 2017-01-12 NOTE — Progress Notes (Signed)
[facility] TEAM 1 - Stepdown/ICU TEAM  [name] [name]  ELF:[id number] DOB: 01-Oct-1950 DOA: 12/22/2016 PCP: Wenda Low, MD    Brief Narrative:  66 y.o.malewith a history ofNon Hodgkin's mantle cell Lymphoma s/p 3 cycles of Rituximab, atrial fibrillation, normocytic anemia, HTN, HLD, and CVA who presented from the oncology infusion center with generalized weakness and near syncope. Pt also reported he had a fall the day prior in which he hit his head. He was found to be dehydrated, anemic, thrombocytopenic, tachycardic and in acute renal failure.   Subjective: Resting comfortably at the time of my visit. Confusion and agitation have been a significant problem over the last 24hrs.    Assessment & Plan:  Hypotension w/ near syncope  Diff includes acute stroke, anemia, orthostatic hypotension/dehydration from chemotherapy - likely all of the above - BP improved rapidly w/ simple volume expansion   Anemia of chronic disease secondary to malignancy and chemotherapy. S/P 2U PRBC transfusion - stool for occult blood was negative - Hgb holding steady - refused EGD for w/u during recent hospital stay - CT abdom w/o evidence of [facility] Recent Labs  Lab 01/09/17 0854 01/10/17 1153 01/11/17 0425 01/11/17 1508 01/12/17 0402  HGB 8.2* 8.2* 7.3* 7.3* 8.1*    Acute small infarct in the left cerebellar hemisphere  Neurology directed complete CVA workup - to begin ASA 81mg  QD when safe to do so   Non-Hodgkin's mantle cell lymphoma on the right side of the neck Further management as per Dr. Jana Hakim  Mild acute kidney injury prerenal in etiology - crt has normalized   Mild thrombocytopenia ?due to malignancy and chemotherapy - holding ASA for now - recheck plt in AM  HLD Cont Lipitor   Chronic atrial fibrillation rate controlled with amiodarone - no anticoagulation due to severe anemia - rate acceptable but not ideal - follow w/o change today  Right upper extremity swelling possibly  from lymph node involvement from mantle cell lymphoma. R upper extremity venous duplex w/o evidence of DVT - hx suggests this is a long standing issue   1 of 2 Blood Cx + for coag neg Meth resistant Staph Suspicious for contaminant - follow clinically w/o abx tx   DVT prophylaxis: SCDs Code Status: DNR - NO CODE Family Communication: no family present at time of exam  Disposition Plan: needs SNF   Consultants:  Neurology   Procedures: none  Antimicrobials:  none   Objective: Blood pressure (!) 115/46, pulse (!) 103, temperature 98.9 F (37.2 C), temperature source Axillary, resp. rate 16, height 6' (1.829 m), weight 100.3 kg (221 lb 1.9 oz), SpO2 92 %.  Intake/Output Summary (Last 24 hours) at 01/12/2017 1224 Last data filed at 01/11/2017 2121 Gross per 24 hour  Intake 735 ml  Output -  Net 735 ml   Filed Weights   01/10/17 0542 01/11/17 0318 01/12/17 0649  Weight: 97.8 kg (215 lb 9.8 oz) 96.7 kg (213 lb 3 oz) 100.3 kg (221 lb 1.9 oz)    Examination: General: No acute respiratory distress evident  Lungs: Clear to auscultation bilaterally Cardiovascular: mildly tachycardic  Abdomen: soft, bowel sounds positive Extremities: No significant edema B LE   CBC: Recent Labs  Lab 01/08/17 0352 01/09/17 0854 01/10/17 1153 01/11/17 0425 01/11/17 1508 01/12/17 0402  WBC 6.3 6.7 7.8 6.5  --  6.9  HGB 7.3* 8.2* 8.2* 7.3* 7.3* 8.1*  HCT 23.8* 25.9* 27.2* 24.1* 24.1* 26.6*  MCV 98.8 97.7 98.6 100.0  --  98.5  PLT 48* 53* 60* 53*  --  44*   Basic Metabolic Panel: Recent Labs  Lab 01/08/17 0352 01/09/17 0854 01/10/17 0411 01/10/17 1153 01/11/17 0425 01/12/17 0402  NA 138 138  --  139 141 139  K 3.9 3.8  --  4.4 4.2 3.9  CL 109 106  --  107 109 105  CO2 24 23  --  24 24 26   GLUCOSE 103* 109*  --  88 89 80  BUN 10 11  --  15 12 11   CREATININE 0.54* 0.57*  --  0.70 0.69 0.63  CALCIUM 7.9* 7.8*  --  8.0* 7.8* 7.9*  MG 1.6* 1.9 2.1  --  1.9 1.9   GFR: Estimated  Creatinine Clearance: 111.4 mL/min (by C-G formula based on SCr of 0.63 mg/dL).  Liver Function Tests: No results for input(s): AST, ALT, ALKPHOS, BILITOT, PROT, ALBUMIN in the last 168 hours.  HbA1C: Hgb A1c MFr Bld  Date/Time Value Ref Range Status  12/12/2016 01:48 AM 5.2 4.8 - 5.6 % Final    Comment:    (NOTE) Pre diabetes:          5.7%-6.4% Diabetes:              >6.4% Glycemic control for   <7.0% adults with diabetes     Scheduled Meds: . allopurinol  300 mg Oral Daily  . amiodarone  200 mg Oral Daily  . atorvastatin  20 mg Oral q1800  . diltiazem  180 mg Oral Daily  . ferrous sulfate  325 mg Oral Q breakfast  . folic acid  1 mg Oral Daily  . metoprolol tartrate  37.5 mg Oral BID  . pantoprazole  40 mg Oral Q0600  . QUEtiapine  25 mg Oral QHS  . QUEtiapine  50 mg Oral q morning - 10a     LOS: 10 days   [name], MD [facility] Office  [phone] Pager - Text Page per Amion as per below:  On-Call/Text Page:      [email or web address]      password [email or web address]  If 7PM-7AM, please contact night-coverage [email or web address] Password [email or web address] 01/12/2017, 12:24 PM

## 2017-01-12 NOTE — Progress Notes (Addendum)
Patient continues to be extremely confused.  Patient has yelled the entire night for help,  Patient has started to see people and objects that are not there  Patient is only alert to self at times   Patient is very pleasant when RN enters the room, patient is very calm when RN is in the room with him however whenever patient is alone he continues to yell out for help

## 2017-01-12 NOTE — Significant Event (Addendum)
Rapid Response Event Note  Call Time 2122 Start Time 2125  Called by charge RN about patient having respiratory distress.  Patient is a DNR and Palliative Medicine saw the patient earlier today. RT and I arrived and we assessed the patient, sats were in the 80s per the RN, Primary RN did increased the oxygen to 4L and sats only improved 90% prior to my arrival. Patient appears to be uncomfortable, moans, grimaces, and is diaphoretic.  Clear to diminished lung sounds per RT.  Sats improved greater than 93% to 6L. HR in low 100s, BP stable.   TR[facility] NP was paged, orders received for IV morphine for comfort/work of breathing and IV Lopressor for HR (was on PO, patient is lethargic and not able to take POs safely, has been confused and delirious as well). Patient appears to be transitioning into End of Life presentation.   End Time 2145  I called at 0349 for an update, per RN, patient was more comfortable after morphine was given and oxygen was weaned down to 4L.   I received a call from the bedside RN at 630, patient's sats were in the 42s, I asked the RN to place the on VM and if needed a NRB and to page the primary service on call to update them.

## 2017-01-13 ENCOUNTER — Inpatient Hospital Stay (HOSPITAL_COMMUNITY): Payer: Medicare Other

## 2017-01-13 DIAGNOSIS — R0902 Hypoxemia: Secondary | ICD-10-CM

## 2017-01-13 LAB — BLOOD GAS, ARTERIAL
ACID-BASE EXCESS: 1.9 mmol/L (ref 0.0–2.0)
Bicarbonate: 27.4 mmol/L (ref 20.0–28.0)
DRAWN BY: 275531
FIO2: 100
O2 Saturation: 95.7 %
PATIENT TEMPERATURE: 98.6
PH ART: 7.316 — AB (ref 7.350–7.450)
pCO2 arterial: 55.2 mmHg — ABNORMAL HIGH (ref 32.0–48.0)
pO2, Arterial: 79.5 mmHg — ABNORMAL LOW (ref 83.0–108.0)

## 2017-01-13 LAB — COMPREHENSIVE METABOLIC PANEL
ALBUMIN: 1.8 g/dL — AB (ref 3.5–5.0)
ALT: 12 U/L — AB (ref 17–63)
AST: 38 U/L (ref 15–41)
Alkaline Phosphatase: 96 U/L (ref 38–126)
Anion gap: 9 (ref 5–15)
BUN: 15 mg/dL (ref 6–20)
CO2: 28 mmol/L (ref 22–32)
CREATININE: 0.84 mg/dL (ref 0.61–1.24)
Calcium: 8.2 mg/dL — ABNORMAL LOW (ref 8.9–10.3)
Chloride: 106 mmol/L (ref 101–111)
GFR calc non Af Amer: >60 mL/min (ref >60)
GLUCOSE: 130 mg/dL — AB (ref 65–99)
Potassium: 4.8 mmol/L (ref 3.5–5.1)
SODIUM: 143 mmol/L (ref 135–145)
Total Bilirubin: 1.6 mg/dL — ABNORMAL HIGH (ref 0.3–1.2)
Total Protein: 4.6 g/dL — ABNORMAL LOW (ref 6.5–8.1)

## 2017-01-13 LAB — CBC
HCT: 27.9 % — ABNORMAL LOW (ref 39.0–52.0)
Hemoglobin: 8.2 g/dL — ABNORMAL LOW (ref 13.0–17.0)
MCH: 30.6 pg (ref 26.0–34.0)
MCHC: 29.4 g/dL — AB (ref 30.0–36.0)
MCV: 104.1 fL — AB (ref 78.0–100.0)
PLATELETS: 52 10*3/uL — AB (ref 150–400)
RBC: 2.68 MIL/uL — AB (ref 4.22–5.81)
RDW: 20.3 % — ABNORMAL HIGH (ref 11.5–15.5)
WBC: 7.8 10*3/uL (ref 4.0–10.5)

## 2017-01-13 LAB — GLUCOSE, CAPILLARY: GLUCOSE-CAPILLARY: 97 mg/dL (ref 65–99)

## 2017-01-13 LAB — STREP PNEUMONIAE URINARY ANTIGEN: Strep Pneumo Urinary Antigen: NEGATIVE

## 2017-01-13 MED ORDER — MORPHINE SULFATE (PF) 2 MG/ML IV SOLN
1.0000 mg | INTRAVENOUS | Status: DC | PRN
Start: 2017-01-13 — End: 2017-01-13

## 2017-01-13 MED ORDER — METOPROLOL TARTRATE 5 MG/5ML IV SOLN
5.0000 mg | Freq: Four times a day (QID) | INTRAVENOUS | Status: DC
  Administered 2017-01-13 – 2017-01-18 (×19): 5 mg via INTRAVENOUS
  Filled 2017-01-13 (×18): qty 5

## 2017-01-13 MED ORDER — MORPHINE SULFATE (PF) 4 MG/ML IV SOLN
1.0000 mg | INTRAVENOUS | Status: DC | PRN
  Administered 2017-01-13 – 2017-01-14 (×5): 2 mg via INTRAVENOUS
  Filled 2017-01-13 (×5): qty 1

## 2017-01-13 MED ORDER — VANCOMYCIN HCL IN DEXTROSE 1-5 GM/200ML-% IV SOLN
1000.0000 mg | Freq: Three times a day (TID) | INTRAVENOUS | Status: DC
  Administered 2017-01-13 – 2017-01-14 (×2): 1000 mg via INTRAVENOUS
  Filled 2017-01-13 (×4): qty 200

## 2017-01-13 MED ORDER — VANCOMYCIN HCL 10 G IV SOLR
2000.0000 mg | Freq: Once | INTRAVENOUS | Status: AC
  Administered 2017-01-13: 2000 mg via INTRAVENOUS
  Filled 2017-01-13: qty 2000

## 2017-01-13 MED ORDER — PANTOPRAZOLE SODIUM 40 MG IV SOLR
40.0000 mg | INTRAVENOUS | Status: DC
  Administered 2017-01-13 – 2017-01-18 (×6): 40 mg via INTRAVENOUS
  Filled 2017-01-13 (×6): qty 40

## 2017-01-13 MED ORDER — DEXTROSE 5 % IV SOLN
1.0000 g | Freq: Three times a day (TID) | INTRAVENOUS | Status: DC
  Administered 2017-01-13 – 2017-01-15 (×8): 1 g via INTRAVENOUS
  Filled 2017-01-13 (×10): qty 1

## 2017-01-13 MED ORDER — FUROSEMIDE 10 MG/ML IJ SOLN
80.0000 mg | Freq: Once | INTRAMUSCULAR | Status: AC
  Administered 2017-01-13: 80 mg via INTRAVENOUS
  Filled 2017-01-13: qty 8

## 2017-01-13 MED ORDER — MORPHINE SULFATE (PF) 2 MG/ML IV SOLN
0.5000 mg | INTRAVENOUS | Status: DC | PRN
  Administered 2017-01-13: 0.5 mg via INTRAVENOUS
  Filled 2017-01-13: qty 1

## 2017-01-13 NOTE — Progress Notes (Signed)
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 Pt also reported he had a fall the day prior in which he hit his head. He was found to be dehydrated, anemic, thrombocytopenic, tachycardic, and in acute renal failure.   Subjective: Pt has declined over night, w/ dropping sats and decreased responsiveness.  A CXR this AM suggests possibility of pulmonary edema v/s PNA v/s both.  Pt is DNR but is NOT comfort care and PC and I believe he would still desire tx of acute issues.  He is lethargic and unable to provide an answer to my questions at this time.     Assessment & Plan:  Acute hypoxic resp failure - PNA v/s Pulmonary edema v/s both Initiate tx for HCAP - initiate IV lasix diureses - follow Is/Os - supplemental O2 as required - check ABG  Hypotension w/ near syncope  Diff includes acute stroke, anemia, orthostatic hypotension/dehydration from chemotherapy - likely all of the above - BP improved rapidly w/ simple volume expansion   Anemia of chronic disease secondary to malignancy and chemotherapy. 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 Jana Hakim  Mild acute kidney  injury prerenal in etiology - crt has normalized  Mild thrombocytopenia ?due to malignancy and chemotherapy - holding ASA for now - following plt count in serial fashion  HLD Cont Lipitor   Chronic atrial fibrillation rate controlled with amiodarone - no anticoagulation due to severe anemia - HR controlled   Right upper extremity swelling possibly from lymph node involvement from mantle cell lymphoma. R upper extremity venous duplex w/o evidence of DVT - hx suggests this is a long standing issue - keep R UE elevated   1 of 2 Blood Cx + for coag neg Meth resistant Staph Suspicious for contaminant - follow clinically - repeat blood cx today - will be covered w/ Vanc being used for PNA   DVT prophylaxis: SCDs Code Status: DNR - NO CODE but STILL ACTIVE TX Family Communication: no family present at time of exam  Disposition Plan: transfer to SDU  Consultants:  Neurology  Palliative Care   Procedures: none  Antimicrobials:  Maxipime 1/1 > Vanc 1/1 >  Objective: Blood pressure 111/66, pulse (!) 104, temperature 99.4 F (37.4 C), temperature source Axillary, resp. rate 20, height 6' (1.829 m), weight 100.3 kg (221 lb 1.9 oz), SpO2 95 %. No intake or output data in the 24 hours ending 01/13/17 1011 Filed Weights   01/10/17 0542 01/11/17 0318 01/12/17 0649  Weight: 97.8 kg (215 lb 9.8 oz) 96.7 kg (213 lb 3 oz) 100.3 kg (221 lb 1.9 oz)    Examination: General: in mild distress w/  tachypnea  Lungs: fine crackles diffusely - no wheezing  Cardiovascular: rate controlled - irregular - no appreciable M Abdomen: soft, bowel sounds positive, obese, no rebound  Extremities: trace B LE edema - 1+ L UE edema - 2+ R UE edema   CBC: Recent Labs  Lab 01/09/17 0854 01/10/17 1153 01/11/17 0425 01/11/17 1508 01/12/17 0402 01/13/17 0613  WBC 6.7 7.8 6.5  --  6.9 PENDING  HGB 8.2* 8.2* 7.3* 7.3* 8.1* 8.2*  HCT 25.9* 27.2* 24.1* 24.1* 26.6* 27.9*  MCV 97.7 98.6 100.0  --  98.5  104.1*  PLT 53* 60* 53*  --  44* PENDING   Basic Metabolic Panel: Recent Labs  Lab 01/08/17 0352 01/09/17 0854 01/10/17 0411 01/10/17 1153 01/11/17 0425 01/12/17 0402 01/13/17 0613  NA 138 138  --  139 141 139 143  K 3.9 3.8  --  4.4 4.2 3.9 4.8  CL 109 106  --  107 109 105 106  CO2 24 23  --  24 24 26 28   GLUCOSE 103* 109*  --  88 89 80 130*  BUN 10 11  --  15 12 11 15   CREATININE 0.54* 0.57*  --  0.70 0.69 0.63 0.84  CALCIUM 7.9* 7.8*  --  8.0* 7.8* 7.9* 8.2*  MG 1.6* 1.9 2.1  --  1.9 1.9  --    GFR: Estimated Creatinine Clearance: 106.1 mL/min (by C-G formula based on SCr of 0.84 mg/dL).  Liver Function Tests: Recent Labs  Lab 01/13/17 0613  AST 38  ALT 12*  ALKPHOS 96  BILITOT 1.6*  PROT 4.6*  ALBUMIN 1.8*    HbA1C: Hgb A1c MFr Bld  Date/Time Value Ref Range Status  12/12/2016 01:48 AM 5.2 4.8 - 5.6 % Final    Comment:    (NOTE) Pre diabetes:          5.7%-6.4% Diabetes:              >6.4% Glycemic control for   <7.0% adults with diabetes     Scheduled Meds: . allopurinol  300 mg Oral Daily  . amiodarone  200 mg Oral Daily  . atorvastatin  20 mg Oral q1800  . diltiazem  180 mg Oral Daily  . ferrous sulfate  325 mg Oral Q breakfast  . folic acid  1 mg Oral Daily  . furosemide  80 mg Intravenous Once  . metoprolol tartrate  37.5 mg Oral BID  . pantoprazole  40 mg Oral Q0600  . QUEtiapine  25 mg Oral QHS  . QUEtiapine  50 mg Oral q morning - 10a     LOS: 11 days   [name], MD [facility] Office  [phone] Pager - Text Page per Amion as per below:  On-Call/Text Page:      [email or web address]      password [email or web address]  If 7PM-7AM, please contact night-coverage [email or web address] Password [email or web address] 01/13/2017, 10:11 AM

## 2017-01-13 NOTE — Progress Notes (Addendum)
Received report from [place], [place] on [facility]. Pt transferred to unit via bed accompanied by 2 RN's. Upon arrival pt moaning, disoriented x4 and unable to answer any questions. Will continue to monitor.

## 2017-01-13 NOTE — Progress Notes (Signed)
Pharmacy Antibiotic Note  [name] [name] is a 67 y.o. male admitted on 12/25/2016 with Pneumonia. PMHx significant for non-Hodgkin's mantle cell lymphoma. Pharmacy has been consulted for Vancomycin dosing. WBC wnl, Scr stable, Tmax 100.9. Patient now having respiratory distress with diminished lung sounds per RT.  Plan: Vancomycin 2g IV once, then Vancomycin 1g IV every 8 hours. Goal trough 15-20 mcg/mL. Monitor clinic progress, renal function, electrolytes F/u BCx, C/S, vancomycin trough levels, future de-escalation, & length of therapy  Height: 6' (182.9 cm) Weight: 221 lb 1.9 oz (100.3 kg) IBW/kg (Calculated) : 77.6  Temp (24hrs), Avg:99.3 F (37.4 C), Min:98.4 F (36.9 C), Max:100.9 F (38.3 C)  Recent Labs  Lab 01/09/17 0854 01/10/17 1153 01/11/17 0425 01/12/17 0402 01/13/17 0613  WBC 6.7 7.8 6.5 6.9 PENDING  CREATININE 0.57* 0.70 0.69 0.63 0.84    Estimated Creatinine Clearance: 106.1 mL/min (by C-G formula based on SCr of 0.84 mg/dL).    No Known Allergies  Antimicrobials this admission: Cefepime 01/13/17 >> Vancomycin 01/13/17 >>  Dose adjustments this admission:  Thank you for allowing pharmacy to be a part of this patient's care.  Nida Boatman, PharmD PGY1 Acute Care Pharmacy Resident Pager: [phone] 01/13/2017 10:25 AM

## 2017-01-13 NOTE — Progress Notes (Signed)
Patient ID: TAMIKA NOU, male   DOB: 08-31-1950, 67 y.o.   MRN: [id number]  This NP visited patient at the bedside as a follow up to  yesterday's [place].  Patient had dramatic change over night, currently he is minimally responsive and requiring a nonrebreather to keep his saturations above 90%.  Call placed to the attending/Dr [facility] and patient is currently being worked up.  Chest x-ray this morning suggest possibility of pulmonary edema versus pneumonia versus both.  Patient is a DNR but is not comfort care and per discussion yesterday the plan is to treat the treatable and hope for improvement.   Discussed the  Plan of care in detail with bedside RN and charge nurse  Patient has no living relatives to notify  and has been a resident of his [facility] for 10 years.  PMT will continue to support holistically    Questions and concerns addressed  Time in 0800          Time out 0840     total time spent on the unit was 40 minutes  Greater than 50% of the time was spent in counseling and coordination of care  Wadie Lessen NP  Palliative Medicine Team Team Phone # [phone] Pager [phone]

## 2017-01-13 NOTE — Progress Notes (Signed)
Lasix given, Foley put in. Report called to Andee Poles, RN on [facility].

## 2017-01-13 DEATH — deceased

## 2017-01-14 ENCOUNTER — Inpatient Hospital Stay (HOSPITAL_COMMUNITY): Payer: Medicare Other

## 2017-01-14 DIAGNOSIS — I639 Cerebral infarction, unspecified: Principal | ICD-10-CM

## 2017-01-14 DIAGNOSIS — I361 Nonrheumatic tricuspid (valve) insufficiency: Secondary | ICD-10-CM

## 2017-01-14 LAB — BLOOD GAS, ARTERIAL
ACID-BASE EXCESS: 4.6 mmol/L — AB (ref 0.0–2.0)
BICARBONATE: 30.9 mmol/L — AB (ref 20.0–28.0)
Drawn by: 331761
FIO2: 100
O2 Saturation: 95.1 %
PCO2 ART: 71.6 mmHg — AB (ref 32.0–48.0)
PO2 ART: 88.6 mmHg (ref 83.0–108.0)
Patient temperature: 100.8
pH, Arterial: 7.266 — ABNORMAL LOW (ref 7.350–7.450)

## 2017-01-14 LAB — CBC
HEMATOCRIT: 25.9 % — AB (ref 39.0–52.0)
Hemoglobin: 7.6 g/dL — ABNORMAL LOW (ref 13.0–17.0)
MCH: 30.4 pg (ref 26.0–34.0)
MCHC: 29.3 g/dL — AB (ref 30.0–36.0)
MCV: 103.6 fL — ABNORMAL HIGH (ref 78.0–100.0)
PLATELETS: 43 10*3/uL — AB (ref 150–400)
RBC: 2.5 MIL/uL — ABNORMAL LOW (ref 4.22–5.81)
RDW: 19.4 % — AB (ref 11.5–15.5)
WBC: 7.4 10*3/uL (ref 4.0–10.5)

## 2017-01-14 LAB — COMPREHENSIVE METABOLIC PANEL
ALBUMIN: 1.7 g/dL — AB (ref 3.5–5.0)
ALT: 12 U/L — ABNORMAL LOW (ref 17–63)
AST: 38 U/L (ref 15–41)
Alkaline Phosphatase: 89 U/L (ref 38–126)
Anion gap: 7 (ref 5–15)
BUN: 21 mg/dL — AB (ref 6–20)
CHLORIDE: 107 mmol/L (ref 101–111)
CO2: 30 mmol/L (ref 22–32)
Calcium: 7.8 mg/dL — ABNORMAL LOW (ref 8.9–10.3)
Creatinine, Ser: 1.08 mg/dL (ref 0.61–1.24)
GFR calc Af Amer: >60 mL/min (ref >60)
GFR calc non Af Amer: >60 mL/min (ref >60)
GLUCOSE: 98 mg/dL (ref 65–99)
POTASSIUM: 3.5 mmol/L (ref 3.5–5.1)
Sodium: 144 mmol/L (ref 135–145)
Total Bilirubin: 2 mg/dL — ABNORMAL HIGH (ref 0.3–1.2)
Total Protein: 4.3 g/dL — ABNORMAL LOW (ref 6.5–8.1)

## 2017-01-14 LAB — BASIC METABOLIC PANEL
ANION GAP: 10 (ref 5–15)
BUN: 23 mg/dL — ABNORMAL HIGH (ref 6–20)
CALCIUM: 7.9 mg/dL — AB (ref 8.9–10.3)
CO2: 31 mmol/L (ref 22–32)
CREATININE: 1.28 mg/dL — AB (ref 0.61–1.24)
Chloride: 104 mmol/L (ref 101–111)
GFR calc Af Amer: >60 mL/min (ref >60)
GFR, EST NON AFRICAN AMERICAN: 57 mL/min — AB (ref >60)
Glucose, Bld: 93 mg/dL (ref 65–99)
Potassium: 3.9 mmol/L (ref 3.5–5.1)
SODIUM: 145 mmol/L (ref 135–145)

## 2017-01-14 LAB — CBC WITH DIFFERENTIAL/PLATELET
BASOS ABS: 0 10*3/uL (ref 0.0–0.1)
BLASTS: 0 %
Band Neutrophils: 3 %
Basophils Relative: 0 %
EOS PCT: 0 %
Eosinophils Absolute: 0 10*3/uL (ref 0.0–0.7)
HCT: 28.2 % — ABNORMAL LOW (ref 39.0–52.0)
HEMOGLOBIN: 8.2 g/dL — AB (ref 13.0–17.0)
Lymphocytes Relative: 55 %
Lymphs Abs: 4.4 10*3/uL — ABNORMAL HIGH (ref 0.7–4.0)
MCH: 30.4 pg (ref 26.0–34.0)
MCHC: 29.1 g/dL — ABNORMAL LOW (ref 30.0–36.0)
MCV: 104.4 fL — AB (ref 78.0–100.0)
METAMYELOCYTES PCT: 0 %
MONOS PCT: 3 %
MYELOCYTES: 0 %
Monocytes Absolute: 0.2 10*3/uL (ref 0.1–1.0)
NEUTROS ABS: 3.4 10*3/uL (ref 1.7–7.7)
NEUTROS PCT: 39 %
NRBC: 1 /100{WBCs} — AB
Other: 0 %
Platelets: 45 10*3/uL — ABNORMAL LOW (ref 150–400)
Promyelocytes Absolute: 0 %
RBC: 2.7 MIL/uL — AB (ref 4.22–5.81)
RDW: 19.5 % — ABNORMAL HIGH (ref 11.5–15.5)
WBC: 8 10*3/uL (ref 4.0–10.5)

## 2017-01-14 LAB — ECHOCARDIOGRAM COMPLETE
HEIGHTINCHES: 71 in
Weight: 4576 oz

## 2017-01-14 LAB — MAGNESIUM: Magnesium: 2.1 mg/dL (ref 1.7–2.4)

## 2017-01-14 LAB — LACTIC ACID, PLASMA: LACTIC ACID, VENOUS: 1.9 mmol/L (ref 0.5–1.9)

## 2017-01-14 LAB — HIV ANTIBODY (ROUTINE TESTING W REFLEX): HIV Screen 4th Generation wRfx: NONREACTIVE

## 2017-01-14 MED ORDER — POTASSIUM CHLORIDE 20 MEQ PO PACK
50.0000 meq | PACK | Freq: Once | ORAL | Status: DC
  Filled 2017-01-14: qty 3

## 2017-01-14 MED ORDER — LORAZEPAM 2 MG/ML IJ SOLN
2.0000 mg | Freq: Once | INTRAMUSCULAR | Status: AC
  Administered 2017-01-14: 2 mg via INTRAVENOUS
  Filled 2017-01-14: qty 1

## 2017-01-14 MED ORDER — VANCOMYCIN HCL IN DEXTROSE 1-5 GM/200ML-% IV SOLN
1000.0000 mg | Freq: Two times a day (BID) | INTRAVENOUS | Status: DC
  Administered 2017-01-14 – 2017-01-15 (×2): 1000 mg via INTRAVENOUS
  Filled 2017-01-14 (×2): qty 200

## 2017-01-14 MED ORDER — HALOPERIDOL LACTATE 5 MG/ML IJ SOLN
3.0000 mg | Freq: Three times a day (TID) | INTRAMUSCULAR | Status: DC
  Administered 2017-01-14 – 2017-01-16 (×5): 3 mg via INTRAVENOUS
  Filled 2017-01-14 (×5): qty 1

## 2017-01-14 MED ORDER — MORPHINE SULFATE (PF) 4 MG/ML IV SOLN
1.0000 mg | Freq: Once | INTRAVENOUS | Status: AC
  Administered 2017-01-14: 1 mg via INTRAVENOUS
  Filled 2017-01-14: qty 1

## 2017-01-14 MED ORDER — ACETAMINOPHEN 650 MG RE SUPP
650.0000 mg | RECTAL | Status: DC | PRN
  Administered 2017-01-14 – 2017-01-18 (×7): 650 mg via RECTAL
  Filled 2017-01-14 (×8): qty 1

## 2017-01-14 MED ORDER — HALOPERIDOL LACTATE 5 MG/ML IJ SOLN
2.0000 mg | Freq: Three times a day (TID) | INTRAMUSCULAR | Status: DC
  Administered 2017-01-14: 2 mg via INTRAVENOUS
  Filled 2017-01-14: qty 1

## 2017-01-14 MED ORDER — POTASSIUM CHLORIDE 10 MEQ/100ML IV SOLN: 10.0000 meq | INTRAVENOUS | Status: DC

## 2017-01-14 MED ORDER — HALOPERIDOL LACTATE 5 MG/ML IJ SOLN
2.0000 mg | Freq: Once | INTRAMUSCULAR | Status: AC
  Administered 2017-01-14: 2 mg via INTRAVENOUS
  Filled 2017-01-14: qty 1

## 2017-01-14 MED ORDER — POTASSIUM CHLORIDE 20 MEQ/15ML (10%) PO SOLN
50.0000 meq | Freq: Every day | ORAL | Status: DC
  Administered 2017-01-14: 50 meq via ORAL
  Filled 2017-01-14 (×2): qty 45

## 2017-01-14 MED ORDER — FUROSEMIDE 10 MG/ML IJ SOLN
80.0000 mg | Freq: Once | INTRAMUSCULAR | Status: AC
  Administered 2017-01-14: 80 mg via INTRAVENOUS
  Filled 2017-01-14: qty 8

## 2017-01-14 NOTE — Progress Notes (Signed)
Pt temp 101.6 axillary. Pt given 650 mg of tylenol and doctor notified, will continue to monitor.

## 2017-01-14 NOTE — Progress Notes (Signed)
Critical lab value received from RRT, pt CO2 71.6. Paged Dr. Sherral Hammers with critical lab value.

## 2017-01-14 NOTE — Progress Notes (Signed)
  Echocardiogram 2D Echocardiogram has been performed.  [name] [name] 01/14/2017, 10:39 AM

## 2017-01-14 NOTE — Progress Notes (Addendum)
14 Fr. NG tube placed in right nare per provider order. Placement checked and verified by two RN's. Pt tolerated well.

## 2017-01-14 NOTE — Progress Notes (Signed)
[name] [name]   DOB:08/12/1950   FM#:[id number]   TTS#:[id number]  Subjective:  [name] [name] is confused and agitated. He was shouting but I could not understand his concern. Staff tells me he has been pulling off his mask. He is partially restrained. Foley in place. No family in room  Objective: middle aged White man examined in bed Vitals:   01/14/17 0300 01/14/17 0450  BP: (!) 90/53 (!) 95/59  Pulse: (!) 113   Resp: 12   Temp:  99.6 F (37.6 C)  SpO2: 98%     Body mass index is 39.89 kg/m.  Intake/Output Summary (Last 24 hours) at 01/14/2017 0710 Last data filed at 01/14/2017 0200 Gross per 24 hour  Intake 1000 ml  Output 2500 ml  Net -1500 ml    CBG (last 3)  Recent Labs    01/13/17 2006  GLUCAP 97     Labs:  Lab Results  Component Value Date   WBC 7.8 01/13/2017   HGB 8.2 (L) 01/13/2017   HCT 27.9 (L) 01/13/2017   MCV 104.1 (H) 01/13/2017   PLT 52 (L) 01/13/2017   NEUTROABS 3.5 12/14/2016     Urine Studies No results for input(s): UHGB, CRYS in the last 72 hours.  Invalid input(s): UACOL, UAPR, USPG, UPH, UTP, UGL, [place], UBIL, UNIT, UROB, [place], UEPI, UWBC, Duwayne Heck [place], [place]  Basic Metabolic Panel: Recent Labs  Lab 01/08/17 0352 01/09/17 0854 01/10/17 0411 01/10/17 1153 01/11/17 0425 01/12/17 0402 01/13/17 0613  NA 138 138  --  139 141 139 143  K 3.9 3.8  --  4.4 4.2 3.9 4.8  CL 109 106  --  107 109 105 106  CO2 24 23  --  _0 GLUCOSE 103* 109*  --  88 89 80 130*  BUN 10 11  --  _1 CREATININE 0.54* 0.57*  --  0.70 0.69 0.63 0.84  CALCIUM 7.9* 7.8*  --  8.0* 7.8* 7.9* 8.2*  MG 1.6* 1.9 2.1  --  1.9 1.9  --    GFR Estimated Creatinine Clearance: 118.8 mL/min (by C-G formula based on SCr of 0.84 mg/dL). Liver Function Tests: Recent Labs  Lab 01/13/17 0613  AST 38  ALT 12*  ALKPHOS 96  BILITOT 1.6*  PROT 4.6*  ALBUMIN 1.8*   No results for input(s): LIPASE, AMYLASE in the last 168 hours. No results for input(s):  AMMONIA in the last 168 hours. Coagulation profile No results for input(s): INR, PROTIME in the last 168 hours.  CBC: Recent Labs  Lab 01/09/17 0854 01/10/17 1153 01/11/17 0425 01/11/17 1508 01/12/17 0402 01/13/17 0613  WBC 6.7 7.8 6.5  --  6.9 7.8  HGB 8.2* 8.2* 7.3* 7.3* 8.1* 8.2*  HCT 25.9* 27.2* 24.1* 24.1* 26.6* 27.9*  MCV 97.7 98.6 100.0  --  98.5 104.1*  PLT 53* 60* 53*  --  44* 52*   Cardiac Enzymes: No results for input(s): CKTOTAL, CKMB, CKMBINDEX, TROPONINI in the last 168 hours. BNP: Invalid input(s): POCBNP CBG: Recent Labs  Lab 01/13/17 2006  GLUCAP 97   D-Dimer No results for input(s): DDIMER in the last 72 hours. Hgb A1c No results for input(s): HGBA1C in the last 72 hours. Lipid Profile No results for input(s): CHOL, HDL, LDLCALC, TRIG, CHOLHDL, LDLDIRECT in the last 72 hours. Thyroid function studies No results for input(s): TSH, T4TOTAL, T3FREE, THYROIDAB in the last 72 hours.  Invalid input(s): FREET3 Anemia work up Recent  Labs    01/11/17 1508  VITAMINB12 617  FOLATE 18.4  FERRITIN 758*  TIBC 165*  IRON 29*  RETICCTPCT 6.3*   Microbiology [facility] x2 yesterday results pending  Studies:  Ct Abdomen Pelvis Wo Contrast  Result Date: 01/12/2017 CLINICAL DATA:  Evaluate for retroperitoneal bleed. History of bladder tumor. 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 EXAM: CT ABDOMEN AND PELVIS WITHOUT CONTRAST TECHNIQUE: Multidetector CT imaging of the abdomen and pelvis was performed following the standard protocol without IV contrast. COMPARISON:  CT abdomen/ pelvis dated 06/22/2012. FINDINGS: Motion degraded images. Lower chest: Moderate bilateral pleural effusions. Patchy opacities in the visualized lungs, incompletely characterized, possibly reflecting moderate interstitial edema versus multifocal infection. Hepatobiliary: Unenhanced liver is notable for a 3.0 cm hypodense lesion in segment 4A (series 3/ image 16), previously  better characterized as a simple cyst. Gallbladder is grossly unremarkable, noting gallbladder sludge. Pancreas: Within normal limits. Spleen: Enlarged, measuring 18.7 cm in maximal craniocaudal dimension. Adrenals/Urinary Tract: Adrenal glands are within normal limits. 6.0 cm medial left upper pole renal cyst (series 3/ image 32), previously 5.3 cm in 2014, benign. Right kidney is within normal limits. No hydronephrosis. Bladder is within normal limits. Stomach/Bowel: Stomach is within normal limits. No evidence of bowel obstruction. Normal appendix (series 3/image 67). Vascular/Lymphatic: No evidence of abdominal aortic aneurysm. Extensive abdominopelvic lymphadenopathy, including: --2.6 cm short axis portacaval node (series 3/ image 29) --1.8 cm short axis left para-aortic node (series 3/ image 44) --2.8 cm short axis right external iliac node (series 3/image 67) --2.8 cm short axis left deep inguinal node (series 3/image 74) --2.3 cm short axis left inguinal node (series 3/ image 89) Reproductive: Prostate is unremarkable. Other: No abdominopelvic ascites.  Extensive body wall edema. No evidence of retroperitoneal hematoma. Scattered right retroperitoneal/posterior perinephric soft tissue nodules/nodes measuring up to 2.9 x 2.7 cm (series 3/image 53). Extraperitoneal 2.2 x 1.9 cm soft tissue implant/node along the left anterior aspect of the bladder (series 3/ image 81). Musculoskeletal: Mild degenerative changes of the visualized thoracolumbar spine. Large, predominantly fat density lesion in the right upper extremity (series 3/ image 1), incompletely visualized, better evaluated on prior right upper extremity CT. IMPRESSION: No evidence of retroperitoneal hematoma. Splenomegaly, extensive abdominopelvic lymphadenopathy, and scattered retroperitoneal/extraperitoneal soft tissue nodules/nodes, as described above. These findings are likely related to the patient's recent diagnosis of mantle cell lymphoma.  Moderate bilateral pleural effusions. Patchy pulmonary opacities, incompletely visualized, possibly reflecting moderate interstitial edema versus multifocal infection. Additional ancillary findings as above. Electronically Signed   By: Julian Hy M.D.   On: 01/12/2017 08:03   Ct Angio Head W Or Wo Contrast  Result Date: 01/02/2017 CLINICAL DATA:  Stroke follow-up EXAM: CT ANGIOGRAPHY HEAD AND NECK TECHNIQUE: Multidetector CT imaging of the head and neck was performed using the standard protocol during bolus administration of intravenous contrast. Multiplanar CT image reconstructions and MIPs were obtained to evaluate the vascular anatomy. Carotid stenosis measurements (when applicable) are obtained utilizing NASCET criteria, using the distal internal carotid diameter as the denominator. CONTRAST:  77m ISOVUE-370 IOPAMIDOL (ISOVUE-370) INJECTION 76% COMPARISON:  None. FINDINGS: CT HEAD FINDINGS Brain: No mass lesion, intraparenchymal hemorrhage or extra-axial collection. No evidence of acute cortical infarct. The corpus callosum is absent. There is an old right frontal lobe infarct. Vascular: No hyperdense vessel or unexpected calcification. Skull: Normal visualized skull base, calvarium and extracranial soft tissues. Sinuses/Orbits: There is moderate mucosal thickening of the right maxillary sinus and mild left maxillary mucosal thickening.  Fluid level in the right sphenoid sinus. Normal orbits. CTA NECK FINDINGS Aortic arch: There is mild calcific atherosclerosis of the aortic arch. There is no aneurysm, dissection or hemodynamically significant stenosis of the visualized ascending aorta and aortic arch. Conventional 3 vessel aortic branching pattern. The visualized proximal subclavian arteries are normal. Right carotid system: The right common carotid origin is widely patent. There is no common carotid or internal carotid artery dissection or aneurysm. No hemodynamically significant stenosis. Left  carotid system: The left common carotid origin is widely patent. There is no common carotid or internal carotid artery dissection or aneurysm. No hemodynamically significant stenosis. Vertebral arteries: The vertebral system is left dominant. Both vertebral artery origins are normal. Both vertebral arteries are normal to their confluence with the basilar artery. Skeleton: There is no bony spinal canal stenosis. No lytic or blastic lesions. Other neck: There are bilateral massively enlarged lymph nodes at all cervical levels, with the largest located at right level 5 AA, measuring up to 5.2 cm. Nodes extend into the upper mediastinum. Upper chest: Small bilateral pleural effusions. Review of the MIP images confirms the above findings CTA HEAD FINDINGS Anterior circulation: --Intracranial internal carotid arteries: Normal. --Anterior cerebral arteries: Normal. --Middle cerebral arteries: Normal. --Posterior communicating arteries: Absent bilaterally. Posterior circulation: --Posterior cerebral arteries: Normal. --Superior cerebellar arteries: Normal. --Basilar artery: Normal. Venous sinuses: As permitted by contrast timing, patent. Anatomic variants: None Delayed phase: No parenchymal contrast enhancement. Review of the MIP images confirms the above findings IMPRESSION: 1. No emergent large vessel occlusion or high-grade stenosis of the major intracranial and cervical arteries. 2.  Aortic Atherosclerosis (ICD10-I70.0). 3. Massively enlarged bilateral cervical lymph nodes throughout the neck, generally unchanged compared to 11/21/2016 and consistent with the diagnosis of non-Hodgkin's lymphoma. 4. 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 5. Small bilateral pleural effusions. Electronically Signed   By: Ulyses Jarred M.D.   On: 01/02/2017 22:18   Dg Chest 2 View  Result Date: 12/20/2016 CLINICAL DATA:  Sepsis, hypotensive EXAM: CHEST  2 VIEW COMPARISON:  12/11/2016, CT  11/27/2016 FINDINGS: Mild cardiomegaly with aortic atherosclerosis. Tiny pleural effusions. Vague opacities are suspected in the right perihilar region and left lung base. No pneumothorax. IMPRESSION: 1. Mild cardiomegaly with tiny pleural effusion 2. Vague right upper lobe/perihilar and left lung base pulmonary opacity, could reflect small foci of infection or pulmonary nodules. Electronically Signed   By: Donavan Foil M.D.   On: 12/24/2016 16:25   Ct Angio Neck W Or Wo Contrast  Result Date: 01/02/2017 CLINICAL DATA:  Stroke follow-up EXAM: CT ANGIOGRAPHY HEAD AND NECK TECHNIQUE: Multidetector CT imaging of the head and neck was performed using the standard protocol during bolus administration of intravenous contrast. Multiplanar CT image reconstructions and MIPs were obtained to evaluate the vascular anatomy. Carotid stenosis measurements (when applicable) are obtained utilizing NASCET criteria, using the distal internal carotid diameter as the denominator. CONTRAST:  45m ISOVUE-370 IOPAMIDOL (ISOVUE-370) INJECTION 76% COMPARISON:  None. FINDINGS: CT HEAD FINDINGS Brain: No mass lesion, intraparenchymal hemorrhage or extra-axial collection. No evidence of acute cortical infarct. The corpus callosum is absent. There is an old right frontal lobe infarct. Vascular: No hyperdense vessel or unexpected calcification. Skull: Normal visualized skull base, calvarium and extracranial soft tissues. Sinuses/Orbits: There is moderate mucosal thickening of the right maxillary sinus and mild left maxillary mucosal thickening. Fluid level in the right sphenoid sinus. Normal orbits. CTA NECK FINDINGS Aortic arch: There is mild calcific atherosclerosis of the aortic  arch. There is no aneurysm, dissection or hemodynamically significant stenosis of the visualized ascending aorta and aortic arch. Conventional 3 vessel aortic branching pattern. The visualized proximal subclavian arteries are normal. Right carotid system: The  right common carotid origin is widely patent. There is no common carotid or internal carotid artery dissection or aneurysm. No hemodynamically significant stenosis. Left carotid system: The left common carotid origin is widely patent. There is no common carotid or internal carotid artery dissection or aneurysm. No hemodynamically significant stenosis. Vertebral arteries: The vertebral system is left dominant. Both vertebral artery origins are normal. Both vertebral arteries are normal to their confluence with the basilar artery. Skeleton: There is no bony spinal canal stenosis. No lytic or blastic lesions. Other neck: There are bilateral massively enlarged lymph nodes at all cervical levels, with the largest located at right level 5 AA, measuring up to 5.2 cm. Nodes extend into the upper mediastinum. Upper chest: Small bilateral pleural effusions. Review of the MIP images confirms the above findings CTA HEAD FINDINGS Anterior circulation: --Intracranial internal carotid arteries: Normal. --Anterior cerebral arteries: Normal. --Middle cerebral arteries: Normal. --Posterior communicating arteries: Absent bilaterally. Posterior circulation: --Posterior cerebral arteries: Normal. --Superior cerebellar arteries: Normal. --Basilar artery: Normal. Venous sinuses: As permitted by contrast timing, patent. Anatomic variants: None Delayed phase: No parenchymal contrast enhancement. Review of the MIP images confirms the above findings IMPRESSION: 1. No emergent large vessel occlusion or high-grade stenosis of the major intracranial and cervical arteries. 2.  Aortic Atherosclerosis (ICD10-I70.0). 3. Massively enlarged bilateral cervical lymph nodes throughout the neck, generally unchanged compared to 11/21/2016 and consistent with the diagnosis of non-Hodgkin's lymphoma. 4. 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 5. Small bilateral pleural effusions. Electronically Signed   By: Ulyses Jarred  M.D.   On: 01/02/2017 22:18   [name] Brain Wo Contrast  Result Date: 12/26/2016 CLINICAL DATA:  Initial evaluation for acute altered mental status. History of min coma. EXAM: MRI HEAD WITHOUT CONTRAST TECHNIQUE: Multiplanar, multiecho pulse sequences of the brain and surrounding structures were obtained without intravenous contrast. COMPARISON:  None. FINDINGS: Brain: Study limited as the patient was unable to tolerate the full length of the exam. Additionally, images provided are markedly degraded by motion artifact. On coronal DWI sequence, there is a 5 mm focus of diffusion abnormality within the left cerebellar hemisphere, suspicious for possible small acute ischemic infarct (series 5, image 9). This is not well seen on axial sequence. No secondary findings to suggest associated hemorrhage or mass effect. No other convincing foci of restricted diffusion to suggest acute or subacute ischemia. Minimal patchy diffusion abnormality overlying the surface/cortical gray matter of the right parieto-occipital region favored to be artifactual in nature (series 3, image 27). No other acute intracranial process. Agenesis of the corpus callosum again noted. Encephalomalacia out within the right parietal region related to prior hemorrhage. No mass lesion or midline shift. No significant mass effect. No extra-axial fluid collection. Vascular: Major intracranial vascular flow voids are maintained at the skullbase. Skull and upper cervical spine: Craniocervical junction within normal limits. Upper cervical spine normal. Bone marrow signal intensity grossly within normal limits. No scalp soft tissue abnormality. Sinuses/Orbits: The globes and orbital soft tissues within normal limits. Scattered mucosal thickening throughout the paranasal sinuses. Air-fluid levels within the maxillary and right sphenoid sinus. Trace left mastoid effusion. Other: None. IMPRESSION: 1. Limited exam as the patient was unable to tolerate the full  length of the study. Additionally, images provided are degraded by  motion. 2. Punctate 5 mm focus of diffusion abnormality within the left cerebellar hemisphere, suspicious for small acute ischemic infarct. 3. No other definite acute intracranial abnormality. 4. 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 5. Acute paranasal sinusitis as above. Electronically Signed   By: Jeannine Boga M.D.   On: 12/26/2016 20:57   Dg Chest Port 1 View  Result Date: 01/13/2017 CLINICAL DATA:  67 year old male with a history of shortness of breath and weakness EXAM: PORTABLE CHEST 1 VIEW COMPARISON:  01/09/2017, 12/11/2016, 11/30/2016 FINDINGS: Cardiomediastinal silhouette unchanged with cardiomegaly. Interval development of mixed interstitial and airspace opacities of the bilateral lungs, predominantly right upper and left mid and left lower. No large pleural effusion. IMPRESSION: Multifocal interstitial and airspace disease, which may represent a combination of infection and/ or pulmonary edema. Cardiomegaly Electronically Signed   By: Corrie Mckusick D.O.   On: 01/13/2017 09:57    Assessment: 67 y.o. nursing home resident with atrial fibrillation, prior history of stroke, now with small left cerebellar lesion per brain MRI 12/19/2016, in the setting of mantle cell NHL, anemia, thrombocytopenia  (1) normocytic anemia: with reticulocyte>100;though LDH and t bil are both elevated, DAT is negative for both IgG and complement and haptoglobin is WNL, not consistent with hemolysis; B-12, folate and ferritin adequate;  consdier bleeding as the cause of his anemia, although guaiacs have been negative and recent CT abd shows no evidence of a retroperitoneal bleed    (a) note this type of NHL frequently invades GI teract and can cause ulceration  (2)right cervical lymph node biopsy 11/27/2016: shows mantle cell non-Hodgkin's lymphoma;      (a) the Ki67 is high and the morphologyis c/wan  aggressive pleomorphic variant (a) prednisone x5d started 12/11/2016, 5-day course (b) started rituximab 12/11/2016, repeated12/07 and 12/13 at lower doses with reactons      (i) negative Hep B/C studies 12/03/2016  (3) agenesis of corpus callosum (congenital)  (4) atrial fibrillation: On amiodarone; not anticoagulated because of bleeding concerns  (5) 0.5 cm left cerebellar infarct noted on MRI 01/04/2017  (6) PNA vs fluid overload vs lymphoma involvement in lungs  Plan:  [name]. [place] situation is complicated. His mantle cell lymphoma is not curable but is treatable. He has tolerated rituximab poorly--we have been able to give him 3 cycles, but at reduced doses, and the plan was to continue to treat weekly at increasing doses then consider adding other agents. He has tolerated prednisone well and would be due for a repeat cycle except for current concerns regarding possible pneumonia. He is certainly immunocompromised at baseline.  He is likely to benefit from ibrutinib and we have obtained this for him but are holding off because of concerns re possible bleeding and AFib, both of which can be worsened by that drug. We have many chemotherapy options but they would worsen the immune situation and we would have to wait for issue of PNA to be cleared.  His thrombocytopenia is at least partly due to splenomegaly. His confusion may be due to drugs or situational. I do not know to what extent his corpus callosum abnormality plus history of strokes would be expected to limit his mental capacity.   He told me at our original meeting that he had a surviving sister. I do not know her name or location and he was not sure whether or not he had a HCPOA document on file at the nursing home where he resides.  In short, at this point any  intervention I can attempt for his lymphoma is likely to make the current situation worse. Agree with DNR as written. Will  follow with you.       Bobetta Lime, MD 01/14/2017  7:10 AM Medical Oncology and Hematology [facility] [address] [place], [place] [address] Tel. [phone]    Fax. [phone]

## 2017-01-14 NOTE — Progress Notes (Signed)
1410- per provider order, pt given 2 mg of Haldol. Pt still restless, moaning and hollering into hallway. When speaking to pt, pt still not giving appropriate responses to questions.   1447- pt given additional 2 mg Haldol and continues to yell into the hallway. Will continue to monitor pt.

## 2017-01-14 NOTE — Progress Notes (Signed)
PROGRESS NOTE    [name] GRIEGER  WER:[id number] DOB: 01/16/1950 DOA: 12/28/2016 PCP: Wenda Low, MD   Brief Narrative:  67 y.o. 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 Pt also reported he had a fall the day prior in which he hit his head. He was found to be dehydrated, anemic, thrombocytopenic, tachycardic and in acute renal failure.      Subjective: 1/2 A/O x0, confused, unable to answer questions.  Does not follow commands.  Yelling out consistently to help him though when asked what does he need cannot answer the question.       Assessment & Plan:   Active Problems:   Essential hypertension   Hyperlipidemia   Atrial fibrillation with RVR (HCC)   Normocytic anemia   Lymphadenopathy   Mantle cell lymphoma (HCC)   Hypotension   AKI (acute kidney injury) ([place])   Anxiety state   DNR (do not resuscitate)   Palliative care by specialist  Acute respiratory failure with hypoxia/Sepsis/HCAP? -1/2 patient currently meets guidelines for sepsis temp> 30 C, HR> 90, RR> 20 -Continue current antibiotics - Titrate O2 to maintain SPO2 > 93% -BIPAP QHS - PNA v/s Pulmonary edema v/s both Initiate tx for HCAP - initiate IV lasix diureses - follow Is/Os - supplemental O2 as required - check ABG  Respiratory Acidosis - Place patient on BiPAP PRN and QHS  -ABG on 1/3  Pulmonary PPJKD@@@ -1/2 PCXR worsening pulmonary edema: Possible underlying infectious etiology? -Lasix 80 mg -Morphine 1 mg IV -Echocardiogram pending -PCXR 1/3 pending  Hypotension with near syncope -Multifactorial acute stroke, anemia, orthostatic hypotension, dehydration from chemotherapy -Resolved,  Anemia of chronic disease secondary to malignancy and chemotherapy?/Acute blood loss anemia -Blood negative -Anemia  panel pending -S/P 2U PRBC transfusion  -12/23 transfuse 1 unit PRBC -Per review of EMR refused EGD during recent hospitalization Recent Labs  Lab 01/09/17 0854 01/10/17 1153 01/11/17 0425 01/11/17 1508 01/12/17 0402 01/13/17 0613  HGB 8.2* 8.2* 7.3* 7.3* 8.1* 8.2*  -12/30 repeat H/H shows 1 unit drop -CT abdomen and pelvis: Negative for retroperitoneal bleed the results below -Stable  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 Rory Percy Neurology. Concurs that if patient's hemoglobin stable restart patient on ASA 81 mg daily aspirin.  Restarted  -12/30 doubt ASA 81 mg/day will cause patient to have 1 unit drop in hemoglobin however will DC aspirin  Dementia/Altered Mental status -DO NOT clear large amount of NARCOTICS/BENZODIAZEPINES. This prevents [place] from making accurate assessment of true AMS -Haldol IV 3 mgTID  Non-Hodgkin's mantle cell lymphoma on the right side of the neck -Further management as per Dr. Jana Hakim oncology; continue Rituximab -He would be an excellent candidate for ibrutinib but this can lead to bruising/bleeding and can worsen Afib. For now we are working to increase the rituximab dose and can repeat prednisone next week.  -Per oncology at this point any intervention attempted will most likely make the situation worse.  Acute kidney injury  -prerenal in etiology  Recent Labs  Lab 01/09/17 0854 01/10/17 1153 01/11/17 0425 01/12/17 0402 01/13/17 3267 01/14/17 0609  CREATININE 0.57* 0.70 0.69 0.63 0.84 1.08  -Resolved-  Thrombocytopenia(baseline platelets WNL  in November 2018)  -Secondary to stress reaction, malignancy, chemotherapy -Stable however has not recovered to November baseline -Signs of overt bleeding   HLD  -Lipitor 20 mg daily     Chronic Diastolic CHF -Strict in and out since admission -2.5 L -Daily weight Filed Weights   01/11/17 0318 01/12/17 0649 01/13/17 1232  Weight: 213 lb 3 oz (96.7 kg) 221 lb 1.9 oz (100.3 kg) 286 lb (129.7 kg)  -Future hemoglobin<   8 -S/P 2U PRBC transfusion  -12/23 Transfuse 1 unit PRBC -12/27 transfuse 1 unit PRBC -12/30 transfuse 1 unit PRBC  Chronic Atrial Fibrillation -Currently NSR -Anticoagulation on hold secondary to severe anemia  -Amiodarone 200 mg daily -Diltiazem 180 mg daily -Metoprolol IV 5 mg QID  Pulmonary HTN -See CHF     Right upper extremity swelling chronic/RIGHT neck swelling (chronic)  - possibly from lymph node involvement from mantle cell lymphoma.  Hypokalemia -Potassium goal> 4 -Potassium IV 50 mEq  Hypomagnesia -Magnesium> 2  Goals of care -12/30 PALLIATIVE CARE: Patient with extensive lymphoma, multiple medical problems address CODE STATUS, short-term vs long-term goals of care   DVT prophylaxis: SCD Code Status: DO NOT RESUSCITATE Family Communication: None Disposition Plan: DVD   Consultants:  Oncology Neurology    Procedures/Significant Events:  12/30 CT abdomen and pelvis:: No evidence of retroperitoneal hematoma.   Splenomegaly, extensive abdominopelvic lymphadenopathy, and scattered retroperitoneal/extraperitoneal soft tissue nodules/nodes, as described above. These findings are likely related to the patient's recent diagnosis of mantle cell lymphoma.   Moderate bilateral pleural effusions. Patchy pulmonary opacities, incompletely visualized, possibly reflecting moderate interstitial edema versus multifocal infection.   1/2 PCXR worsening pulmonary edema: Possible underlying infectious etiology?    I have personally reviewed and interpreted all radiology studies and my findings are as above.  VENTILATOR SETTINGS:    Cultures 12/21/2 blood positive coag negative staph (most likely contaminant 12/21 urine  positive insignificant growth 1/1 blood pending 1/1 strep pneumo urine antigen negative     Antimicrobials: Anti-infectives (From admission, onward)   Start     Stop   01/14/17 1700  vancomycin (VANCOCIN) IVPB 1000 mg/200 mL premix         01/13/17 1800  vancomycin (VANCOCIN) IVPB 1000 mg/200 mL premix  Status:  Discontinued     01/14/17 0902   01/13/17 1400  ceFEPIme (MAXIPIME) 1 g in dextrose 5 % 50 mL IVPB     01/21/17 1359   01/13/17 1045  vancomycin (VANCOCIN) 2,000 mg in sodium chloride 0.9 % 500 mL IVPB     01/13/17 1349       Devices    LINES / TUBES:      Continuous Infusions: . ceFEPime (MAXIPIME) IV Stopped (01/14/17 0615)  . vancomycin Stopped (01/14/17 0234)     Objective: Vitals:   01/14/17 0300 01/14/17 0450 01/14/17 0700 01/14/17 0735  BP: (!) 90/53 (!) 95/59 109/69 108/79  Pulse: (!) 113  90 (!) 104  Resp: 12  16 15   Temp:  99.6 F (37.6 C)  98.5 F (36.9 C)  TempSrc:  Axillary  Axillary  SpO2: 98%  97% 90%  Weight:      Height:        Intake/Output Summary (Last 24 hours) at 01/14/2017 0749 Last data filed at 01/14/2017 0200 Gross per 24 hour  Intake 1000 ml  Output 2500 ml  Net -1500 ml   Filed Weights   01/11/17 0318 01/12/17 0649 01/13/17 1232  Weight: 213 lb 3 oz (96.7 kg)  221 lb 1.9 oz (100.3 kg) 286 lb (129.7 kg)    Physical Exam:  General: A/O x0, does not follow commands, positive acute respiratory distress Neck:  Negative scars, masses, torticollis, lymphadenopathy, JVD Lungs: tachypnea, positive diffuse rhonchi, without wheezes or crackles Cardiovascular: Tachycardia, regular rhythm without murmur gallop or rub normal S1 and S2 Abdomen: Morbidly obese, negative abdominal pain, positive distention, positive soft, bowel sounds, no rebound, no ascites, no appreciable mass Extremities: No significant cyanosis, clubbing, or edema bilateral lower extremities Skin:   Psychiatric:  Unable to assess secondary to AMS Central  nervous system: Unable to assess secondary to AMS      Data Reviewed: Care during the described time interval was provided by me .  I have reviewed this patient's available data, including medical history, events of note, physical examination, and all test results as part of my evaluation.   CBC: Recent Labs  Lab 01/09/17 0854 01/10/17 1153 01/11/17 0425 01/11/17 1508 01/12/17 0402 01/13/17 0613  WBC 6.7 7.8 6.5  --  6.9 7.8  HGB 8.2* 8.2* 7.3* 7.3* 8.1* 8.2*  HCT 25.9* 27.2* 24.1* 24.1* 26.6* 27.9*  MCV 97.7 98.6 100.0  --  98.5 104.1*  PLT 53* 60* 53*  --  44* 52*   Basic Metabolic Panel: Recent Labs  Lab 01/08/17 0352 01/09/17 0854 01/10/17 0411 01/10/17 1153 01/11/17 0425 01/12/17 0402 01/13/17 0613 01/14/17 0609  NA 138 138  --  139 141 139 143 144  K 3.9 3.8  --  4.4 4.2 3.9 4.8 3.5  CL 109 106  --  107 109 105 106 107  CO2 24 23  --  24 24 26 28 30   GLUCOSE 103* 109*  --  88 89 80 130* 98  BUN 10 11  --  15 12 11 15  21*  CREATININE 0.54* 0.57*  --  0.70 0.69 0.63 0.84 1.08  CALCIUM 7.9* 7.8*  --  8.0* 7.8* 7.9* 8.2* 7.8*  MG 1.6* 1.9 2.1  --  1.9 1.9  --   --    GFR: Estimated Creatinine Clearance: 92.4 mL/min (by C-G formula based on SCr of 1.08 mg/dL). Liver Function Tests: Recent Labs  Lab 01/13/17 0613 01/14/17 0609  AST 38 38  ALT 12* 12*  ALKPHOS 96 89  BILITOT 1.6* 2.0*  PROT 4.6* 4.3*  ALBUMIN 1.8* 1.7*   No results for input(s): LIPASE, AMYLASE in the last 168 hours. No results for input(s): AMMONIA in the last 168 hours. Coagulation Profile: No results for input(s): INR, PROTIME in the last 168 hours. Cardiac Enzymes: No results for input(s): CKTOTAL, CKMB, CKMBINDEX, TROPONINI in the last 168 hours. BNP (last 3 results) No results for input(s): PROBNP in the last 8760 hours. HbA1C: No results for input(s): HGBA1C in the last 72 hours. CBG: Recent Labs  Lab 01/13/17 2006  GLUCAP 97   Lipid Profile: No results for input(s):  CHOL, HDL, LDLCALC, TRIG, CHOLHDL, LDLDIRECT in the last 72 hours. Thyroid Function Tests: No results for input(s): TSH, T4TOTAL, FREET4, T3FREE, THYROIDAB in the last 72 hours. Anemia Panel: Recent Labs    01/11/17 1508  VITAMINB12 617  FOLATE 18.4  FERRITIN 758*  TIBC 165*  IRON 29*  RETICCTPCT 6.3*   Urine analysis:    Component Value Date/Time   COLORURINE AMBER (A) 01/02/2017 0117   APPEARANCEUR CLEAR 01/02/2017 0117   LABSPEC 1.021 01/02/2017 0117   PHURINE 5.0 01/02/2017 0117   GLUCOSEU NEGATIVE 01/02/2017 0117   HGBUR NEGATIVE 01/02/2017  0117   BILIRUBINUR NEGATIVE 01/02/2017 0117   KETONESUR 5 (A) 01/02/2017 0117   PROTEINUR 100 (A) 01/02/2017 0117   NITRITE NEGATIVE 01/02/2017 0117   LEUKOCYTESUR NEGATIVE 01/02/2017 0117   Sepsis Labs: @LABRCNTIP (procalcitonin:4,lacticidven:4)  ) No results found for this or any previous visit (from the past 240 hour(s)).       Radiology Studies: Dg Chest Port 1 View  Result Date: 01/14/2017 CLINICAL DATA:  67 year old male with pulmonary edema. Subsequent encounter. EXAM: PORTABLE CHEST 1 VIEW COMPARISON:  01/13/2017 in 12/2018 2018 chest x-ray. FINDINGS: Worsening asymmetric patchy airspace disease most notable right upper lobe and left base. This may represent infectious infiltrates and/or pulmonary edema. No pneumothorax detected. Cardiomegaly. Calcified aorta. No acute osseous abnormality. IMPRESSION: Worsening asymmetric patchy airspace disease most notable right upper lobe and left base. This may represent infectious infiltrates and/or pulmonary edema. Cardiomegaly. Electronically Signed   By: Genia Del M.D.   On: 01/14/2017 07:21   Dg Chest Port 1 View  Result Date: 01/13/2017 CLINICAL DATA:  67 year old male with a history of shortness of breath and weakness EXAM: PORTABLE CHEST 1 VIEW COMPARISON:  01/09/2017, 12/11/2016, 11/30/2016 FINDINGS: Cardiomediastinal silhouette unchanged with cardiomegaly. Interval  development of mixed interstitial and airspace opacities of the bilateral lungs, predominantly right upper and left mid and left lower. No large pleural effusion. IMPRESSION: Multifocal interstitial and airspace disease, which may represent a combination of infection and/ or pulmonary edema. Cardiomegaly Electronically Signed   By: Corrie Mckusick D.O.   On: 01/13/2017 09:57        Scheduled Meds: . allopurinol  300 mg Oral Daily  . amiodarone  200 mg Oral Daily  . diltiazem  180 mg Oral Daily  . metoprolol tartrate  5 mg Intravenous Q6H  . pantoprazole (PROTONIX) IV  40 mg Intravenous Q24H   Continuous Infusions: . ceFEPime (MAXIPIME) IV Stopped (01/14/17 0615)  . vancomycin Stopped (01/14/17 0234)     LOS: 12 days    Time spent: 40 minutes    Adeana Grilliot, Geraldo Docker, MD [facility] Pager [phone]   If 7PM-7AM, please contact night-coverage [email or web address] Password [facility] 01/14/2017, 7:49 AM

## 2017-01-14 NOTE — Progress Notes (Signed)
Pt is disoriented and yelling "help" and attempting to pull non-rebreather mask off face. Pt immediately desats when O2 mask is removed. Pt is not restrained and wearing one safety mitt on his right hand to prevent him from pulling off mask. Doctor paged and will continue to monitor.

## 2017-01-14 NOTE — Progress Notes (Signed)
Pharmacy Antibiotic Note  [name] [name] is a 67 y.o. male admitted on 01/11/2017 with Pneumonia. PMHx significant for non-Hodgkin's mantle cell lymphoma. Pharmacy has been consulted for Vancomycin dosing.  Continues on day #2 of abx for possible PNA. CXR this am showed worsening patchy airspace disease representing either infiltrates or edema. Afebrile, WBC wnl.  Plan: Continue cefepime 1g IV Q8h Decrease vancomycin to 1g IV Q12h. Goal trough 15-20 mcg/mL. Monitor clinic progress, renal function, electrolytes F/u BCx, C/S, vancomycin trough levels, future de-escalation, & length of therapy   Height: 5\' 11"  (180.3 cm) Weight: 286 lb (129.7 kg) IBW/kg (Calculated) : 75.3  Temp (24hrs), Avg:99 F (37.2 C), Min:97 F (36.1 C), Max:100.1 F (37.8 C)  Recent Labs  Lab 01/10/17 1153 01/11/17 0425 01/12/17 0402 01/13/17 0613 01/14/17 0609  WBC 7.8 6.5 6.9 7.8 7.4  CREATININE 0.70 0.69 0.63 0.84 1.08    Estimated Creatinine Clearance: 92.4 mL/min (by C-G formula based on SCr of 1.08 mg/dL).    No Known Allergies  Antimicrobials this admission: Cefepime 01/13/17 >> Vancomycin 01/13/17 >>  Dose adjustments this admission:  Thank you for allowing pharmacy to be a part of this patient's care.  Elenor Quinones, PharmD, BCPS Clinical Pharmacist Pager [phone] 01/14/2017 9:05 AM

## 2017-01-14 NOTE — Progress Notes (Signed)
Patient ID: [name] [name], male   DOB: 1950-12-23, 67 y.o.   MRN: [id number]  This NP visited patient at the bedside as a follow up for palliative needs and emotional support  Patient is minimally responsive, appears comfortable.  Plan is to treat the treatable and hope for improvement.   PMT will shadow for needs   No charge  Wadie Lessen NP  Palliative Medicine Team Team Phone # [phone] Pager [phone]

## 2017-01-14 NOTE — Progress Notes (Signed)
Critical lab value CO2 71.6, paged Dr. Hilbert Bible

## 2017-01-14 NOTE — Progress Notes (Signed)
Walked in pt room and pt had pulled NG tube out and was holding it in hand with mitt.

## 2017-01-15 ENCOUNTER — Ambulatory Visit: Payer: Medicare Other | Admitting: Adult Health

## 2017-01-15 ENCOUNTER — Inpatient Hospital Stay (HOSPITAL_COMMUNITY): Payer: Medicare Other

## 2017-01-15 ENCOUNTER — Encounter (HOSPITAL_COMMUNITY): Payer: Self-pay | Admitting: Radiology

## 2017-01-15 ENCOUNTER — Ambulatory Visit: Payer: Medicare Other

## 2017-01-15 ENCOUNTER — Other Ambulatory Visit: Payer: Medicare Other

## 2017-01-15 DIAGNOSIS — R7989 Other specified abnormal findings of blood chemistry: Secondary | ICD-10-CM

## 2017-01-15 DIAGNOSIS — R0603 Acute respiratory distress: Secondary | ICD-10-CM

## 2017-01-15 DIAGNOSIS — J9601 Acute respiratory failure with hypoxia: Secondary | ICD-10-CM

## 2017-01-15 DIAGNOSIS — J810 Acute pulmonary edema: Secondary | ICD-10-CM

## 2017-01-15 DIAGNOSIS — F0150 Vascular dementia without behavioral disturbance: Secondary | ICD-10-CM

## 2017-01-15 DIAGNOSIS — J189 Pneumonia, unspecified organism: Secondary | ICD-10-CM

## 2017-01-15 LAB — BLOOD GAS, ARTERIAL
Acid-Base Excess: 5 mmol/L — ABNORMAL HIGH (ref 0.0–2.0)
BICARBONATE: 28.4 mmol/L — AB (ref 20.0–28.0)
DRAWN BY: 331761
Delivery systems: POSITIVE
Expiratory PAP: 8
FIO2: 60
Inspiratory PAP: 14
O2 Saturation: 86.8 %
PH ART: 7.484 — AB (ref 7.350–7.450)
Patient temperature: 99
RATE: 12 resp/min
pCO2 arterial: 38.3 mmHg (ref 32.0–48.0)
pO2, Arterial: 51.6 mmHg — ABNORMAL LOW (ref 83.0–108.0)

## 2017-01-15 LAB — BASIC METABOLIC PANEL
ANION GAP: 11 (ref 5–15)
BUN: 33 mg/dL — AB (ref 6–20)
CO2: 28 mmol/L (ref 22–32)
Calcium: 7.9 mg/dL — ABNORMAL LOW (ref 8.9–10.3)
Chloride: 109 mmol/L (ref 101–111)
Creatinine, Ser: 1.62 mg/dL — ABNORMAL HIGH (ref 0.61–1.24)
GFR, EST AFRICAN AMERICAN: 49 mL/min — AB (ref >60)
GFR, EST NON AFRICAN AMERICAN: 43 mL/min — AB (ref >60)
Glucose, Bld: 100 mg/dL — ABNORMAL HIGH (ref 65–99)
POTASSIUM: 3.8 mmol/L (ref 3.5–5.1)
SODIUM: 148 mmol/L — AB (ref 135–145)

## 2017-01-15 LAB — RETICULOCYTES
RBC.: 2.52 MIL/uL — ABNORMAL LOW (ref 4.22–5.81)
Retic Count, Absolute: 110.9 10*3/uL (ref 19.0–186.0)
Retic Ct Pct: 4.4 % — ABNORMAL HIGH (ref 0.4–3.1)

## 2017-01-15 LAB — CBC
HEMATOCRIT: 25.5 % — AB (ref 39.0–52.0)
Hemoglobin: 7.4 g/dL — ABNORMAL LOW (ref 13.0–17.0)
MCH: 29.4 pg (ref 26.0–34.0)
MCHC: 29 g/dL — ABNORMAL LOW (ref 30.0–36.0)
MCV: 101.2 fL — AB (ref 78.0–100.0)
Platelets: 46 10*3/uL — ABNORMAL LOW (ref 150–400)
RBC: 2.52 MIL/uL — ABNORMAL LOW (ref 4.22–5.81)
RDW: 19.3 % — ABNORMAL HIGH (ref 11.5–15.5)
WBC: 8.8 10*3/uL (ref 4.0–10.5)

## 2017-01-15 LAB — VANCOMYCIN, TROUGH: Vancomycin Tr: 37 ug/mL (ref 15–20)

## 2017-01-15 LAB — AMMONIA: Ammonia: 74 umol/L — ABNORMAL HIGH (ref 9–35)

## 2017-01-15 LAB — MRSA PCR SCREENING: MRSA BY PCR: POSITIVE — AB

## 2017-01-15 LAB — HEPATIC FUNCTION PANEL
ALT: 13 U/L — AB (ref 17–63)
AST: 47 U/L — ABNORMAL HIGH (ref 15–41)
Albumin: 1.7 g/dL — ABNORMAL LOW (ref 3.5–5.0)
Alkaline Phosphatase: 98 U/L (ref 38–126)
BILIRUBIN DIRECT: 1.1 mg/dL — AB (ref 0.1–0.5)
BILIRUBIN TOTAL: 2.6 mg/dL — AB (ref 0.3–1.2)
Indirect Bilirubin: 1.5 mg/dL — ABNORMAL HIGH (ref 0.3–0.9)
Total Protein: 4.5 g/dL — ABNORMAL LOW (ref 6.5–8.1)

## 2017-01-15 LAB — FERRITIN: Ferritin: 958 ng/mL — ABNORMAL HIGH (ref 24–336)

## 2017-01-15 LAB — VITAMIN B12: VITAMIN B 12: 830 pg/mL (ref 180–914)

## 2017-01-15 LAB — IRON AND TIBC
Iron: 37 ug/dL — ABNORMAL LOW (ref 45–182)
Saturation Ratios: 26 % (ref 17.9–39.5)
TIBC: 140 ug/dL — AB (ref 250–450)
UIBC: 103 ug/dL

## 2017-01-15 LAB — HEMOGLOBIN AND HEMATOCRIT, BLOOD
HCT: 26.2 % — ABNORMAL LOW (ref 39.0–52.0)
HEMOGLOBIN: 7.9 g/dL — AB (ref 13.0–17.0)

## 2017-01-15 LAB — LACTATE DEHYDROGENASE: LDH: 380 U/L — AB (ref 98–192)

## 2017-01-15 LAB — FOLATE: FOLATE: 14.5 ng/mL (ref >5.9)

## 2017-01-15 LAB — MAGNESIUM: Magnesium: 2.1 mg/dL (ref 1.7–2.4)

## 2017-01-15 MED ORDER — LORAZEPAM 2 MG/ML IJ SOLN
2.0000 mg | Freq: Once | INTRAMUSCULAR | Status: DC
Start: 2017-01-15 — End: 2017-01-15
  Filled 2017-01-15: qty 2

## 2017-01-15 MED ORDER — CHLORHEXIDINE GLUCONATE CLOTH 2 % EX PADS
6.0000 | MEDICATED_PAD | Freq: Every day | CUTANEOUS | Status: DC
  Administered 2017-01-16 – 2017-01-18 (×3): 6 via TOPICAL

## 2017-01-15 MED ORDER — LORAZEPAM 2 MG/ML IJ SOLN
2.0000 mg | Freq: Once | INTRAMUSCULAR | Status: AC
  Administered 2017-01-15: 2 mg via INTRAVENOUS
  Filled 2017-01-15: qty 1

## 2017-01-15 MED ORDER — DEXTROSE 5 % IV SOLN
2.0000 g | INTRAVENOUS | Status: DC
  Administered 2017-01-16 – 2017-01-17 (×2): 2 g via INTRAVENOUS
  Filled 2017-01-15 (×3): qty 2

## 2017-01-15 MED ORDER — FUROSEMIDE 10 MG/ML IJ SOLN: 80.0000 mg | Freq: Once | INTRAMUSCULAR | Status: DC

## 2017-01-15 MED ORDER — ALBUMIN HUMAN 25 % IV SOLN
50.0000 g | Freq: Once | INTRAVENOUS | Status: AC
  Administered 2017-01-15 – 2017-01-16 (×4): 12.5 g via INTRAVENOUS
  Filled 2017-01-15: qty 50

## 2017-01-15 MED ORDER — LACTULOSE 10 GM/15ML PO SOLN: 30.0000 g | Freq: Three times a day (TID) | ORAL | Status: DC

## 2017-01-15 MED ORDER — LACTULOSE ENEMA
300.0000 mL | Freq: Two times a day (BID) | ORAL | Status: DC
  Administered 2017-01-15: 300 mL via RECTAL
  Filled 2017-01-15 (×2): qty 300

## 2017-01-15 MED ORDER — ORAL CARE MOUTH RINSE
15.0000 mL | Freq: Two times a day (BID) | OROMUCOSAL | Status: DC
  Administered 2017-01-16 – 2017-01-18 (×6): 15 mL via OROMUCOSAL

## 2017-01-15 MED ORDER — FUROSEMIDE 10 MG/ML IJ SOLN
40.0000 mg | Freq: Every day | INTRAMUSCULAR | Status: DC
  Filled 2017-01-15: qty 4

## 2017-01-15 MED ORDER — FUROSEMIDE 10 MG/ML IJ SOLN
80.0000 mg | Freq: Once | INTRAMUSCULAR | Status: AC
  Administered 2017-01-15: 80 mg via INTRAVENOUS
  Filled 2017-01-15: qty 8

## 2017-01-15 MED ORDER — CHLORHEXIDINE GLUCONATE 0.12 % MT SOLN
15.0000 mL | Freq: Two times a day (BID) | OROMUCOSAL | Status: DC
  Administered 2017-01-15 – 2017-01-18 (×5): 15 mL via OROMUCOSAL
  Filled 2017-01-15 (×2): qty 15

## 2017-01-15 MED ORDER — LORAZEPAM 2 MG/ML IJ SOLN
2.0000 mg | Freq: Once | INTRAMUSCULAR | Status: AC
  Administered 2017-01-15: 3 mg via INTRAVENOUS
  Filled 2017-01-15: qty 2

## 2017-01-15 MED ORDER — SODIUM CHLORIDE 0.9% FLUSH
10.0000 mL | INTRAVENOUS | Status: DC | PRN
  Administered 2017-01-16: 10 mL
  Filled 2017-01-15: qty 40

## 2017-01-15 MED ORDER — IOPAMIDOL (ISOVUE-370) INJECTION 76%
INTRAVENOUS | Status: AC
  Administered 2017-01-15: 100 mL
  Filled 2017-01-15: qty 100

## 2017-01-15 NOTE — Progress Notes (Signed)
Pharmacy Antibiotic Note  [name] [name] is a 67 y.o. male admitted on 12/31/2016 with Pneumonia. PMHx significant for non-Hodgkin's mantle cell lymphoma. Pharmacy has been consulted for Vancomycin dosing.  Continues on day #3 of abx for possible PNA. CXR this am showed worsening patchy airspace disease representing either infiltrates or edema. Afebrile, WBC wnl.  Plan: Continue cefepime 1g IV Q8h Hold vancomycin due to AKI. Will check level later today Monitor clinic progress, renal function, electrolytes F/u BCx, C/S, vancomycin trough levels, future de-escalation, & length of therapy  Will order MRSA PCR to see if we even need to continue vancomycin  Height: 5\' 11"  (180.3 cm) Weight: 286 lb (129.7 kg) IBW/kg (Calculated) : 75.3  Temp (24hrs), Avg:100 F (37.8 C), Min:99 F (37.2 C), Max:101.6 F (38.7 C)  Recent Labs  Lab 01/11/17 0425 01/12/17 0402 01/13/17 0613 01/14/17 0609 01/14/17 1043 01/14/17 1405 01/14/17 1823 01/15/17 0358  WBC 6.5 6.9 7.8 7.4 8.0  --   --  8.8  CREATININE 0.69 0.63 0.84 1.08  --  1.28*  --   --   LATICACIDVEN  --   --   --   --   --   --  1.9  --     Estimated Creatinine Clearance: 78 mL/min (A) (by C-G formula based on SCr of 1.28 mg/dL (H)).    No Known Allergies  Antimicrobials this admission: Cefepime 01/13/17 >> Vancomycin 01/13/17 >>  Dose adjustments this admission:  Thank you for allowing pharmacy to be a part of this patient's care.  Elenor Quinones, PharmD, BCPS Clinical Pharmacist Pager [phone] 01/15/2017 9:20 AM

## 2017-01-15 NOTE — Progress Notes (Signed)
Non Hodkins lymphoma, acute resp failure , resp acidosis, worsening pulmonary edema, hypotension with mere syncope, acute small cva, conts on nonrebreather at 15, febrile confused, conts on iv abx, haldol iv, lopressor iv, palliative consulted- ( treat the treatable and hope for improvement) he has no living relatives, he is from [facility], per MD note he is oriented at baseline.

## 2017-01-15 NOTE — Progress Notes (Signed)
Placed pt on Bipap for night time rest

## 2017-01-15 NOTE — Progress Notes (Signed)
CRITICAL VALUE ALERT  Critical Value:  Vancomycin tr: 37  Date & Time Notied:  01/16/16 @ 2045  Provider Notified: X. Blount  Orders Received/Actions taken:

## 2017-01-15 NOTE — Progress Notes (Signed)
Patient placed back on BiPAP due to O2 saturations at 89 on non-rebreather. Patient continues to yell and attempt to remove mask. Will continue to monitor.

## 2017-01-15 NOTE — Progress Notes (Addendum)
Pt in distress/extremely anxiety ridden, appears to be in pain, attempting to pull off bipap. Attempting to hold off on morphine as MD wants to assess pts baseline mental status if possible (see note) .HR 120s/130s, however low BP so, per parameters, can't give lopressor. Also, unable to give PO cardiac meds. Spoke w/ Dr. Sherral Hammers to relay all.

## 2017-01-15 NOTE — Progress Notes (Signed)
Patient was transported to CT on BIPAP & back to 3M06 without any complications.

## 2017-01-15 NOTE — Progress Notes (Signed)
Ammonia, bili, liver enzyme labs back, elevated. Notified attending MD

## 2017-01-15 NOTE — Progress Notes (Signed)
Patient was yelling out all night trying to remove BiPAP. Removed BiPAP and replaced non-rebreather at 0430. Patient continues to yell and remove mask. Oxygen saturations at this time above 90%. Will continue to monitor

## 2017-01-15 NOTE — Progress Notes (Signed)
CSW assisting to find decision maker for patient- spoke with [facility] admissions coordinator who will check pt information to confirm no other family or contacts available for pt.  CSW will continue to follow  Jorge Ny, LCSW Clinical Social Worker [phone]

## 2017-01-15 NOTE — Progress Notes (Signed)
Pt w/ SpO2 consistently in mid to high 80s for past 30 min. Spoke w/ RT who is at bedside. Switching back to bipap.

## 2017-01-15 NOTE — Progress Notes (Signed)
Pharmacy Antibiotic Note  [name] [name] is a 67 y.o. male admitted on 12/18/2016 with Pneumonia. PMHx significant for non-Hodgkin's mantle cell lymphoma. Pharmacy has been consulted for Vancomycin dosing.  Continues on day #3 of abx for possible PNA. CXR this am showed worsening patchy airspace disease representing either infiltrates or edema. Afebrile, WBC wnl.  Patient has AKI (SCr 0.54 > 1.62) and vancomycin trough is supra-therapeutic at 37 mcg/mL despite ~3 hours delay in lab draw.   Plan: Continue to hold vancomycin Change cefepime to 2gm IV Q24H F/U BMET in AM to determine when to repeat vanc level   Height: 5\' 11"  (180.3 cm) Weight: 283 lb (128.4 kg) IBW/kg (Calculated) : 75.3  Temp (24hrs), Avg:99.8 F (37.7 C), Min:99 F (37.2 C), Max:100.6 F (38.1 C)  Recent Labs  Lab 01/12/17 0402 01/13/17 0613 01/14/17 0609 01/14/17 1043 01/14/17 1405 01/14/17 1823 01/15/17 0358 01/15/17 1008 01/15/17 1845  WBC 6.9 7.8 7.4 8.0  --   --  8.8  --   --   CREATININE 0.63 0.84 1.08  --  1.28*  --   --  1.62*  --   LATICACIDVEN  --   --   --   --   --  1.9  --   --   --   VANCOTROUGH  --   --   --   --   --   --   --   --  37*    Estimated Creatinine Clearance: 61.2 mL/min (A) (by C-G formula based on SCr of 1.62 mg/dL (H)).    No Known Allergies   Cefepime 01/13/17 >> Vancomycin 01/13/17 >>  1/3 VT = 37 mcg/mL (drawn ~3 hrs late) on 1g q8 >> held (SCr 1.62)  12/20 BCx: 1/2 MRSE (likely contaminant) 12/20 urine: insignficant growth 1/1 BCx: sent 1/1 Sputum Cx: sent 1/3 MRSA PCR positive   Madhuri Vacca D. Mina Marble, PharmD, BCPS Pager:  [phone] 01/15/2017, 9:30 PM

## 2017-01-15 NOTE — Progress Notes (Signed)
PROGRESS NOTE    [name] [name]  ZDG:[id number] DOB: September 26, 1950 DOA: 12/24/2016 PCP: Wenda Low, MD   Brief Narrative:  67 y.o. 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 Pt also reported he had a fall the day prior in which he hit his head. He was found to be dehydrated, anemic, thrombocytopenic, tachycardic and in acute renal failure.      Subjective: 1/3 last 24 hours MAXIMUM TEMPERATURE 38.7C A/O 0, confused, unable to answer questions. Just yells help me help me.     Assessment & Plan:   Active Problems:   Essential hypertension   Hyperlipidemia   Atrial fibrillation with RVR (HCC)   Normocytic anemia   Lymphadenopathy   Mantle cell lymphoma (HCC)   Hypotension   AKI (acute kidney injury) ([place])   Anxiety state   DNR (do not resuscitate)   Palliative care by specialist  Acute respiratory failure with hypoxia/Sepsis/HCAP? -1/2 patient currently meets guidelines for sepsis temp> 30 C, HR> 90, RR> 20 -Continue current antibiotics - Titrate O2 to maintain SPO2 > 93% -BiPAP QHS and when sleeping - Worsening multifocal pneumonia see CT results below  -Continue current antibiotic  Respiratory Acidosis - Place patient on BiPAP PRN and QHS  -1/3 ABG essentially WNL  Acute pulmonary edema - 1/3 PCXR worsening pulmonary edema/infection see results below (ARDS): Appears to be heading toward ARDS unfortunately DO NOT RESUSCITATE therefore cannot intubate. Will attempt to diuresis and use BiPAP -See A. fib  Hypotension with near syncope -Multifactorial acute stroke, anemia, orthostatic hypotension, dehydration from chemotherapy -Albumin 50 g -MAP goal> 65  Anemia of chronic disease secondary to malignancy and chemotherapy?/Acute blood loss anemia -Blood negative -Anemia panel  pending -S/P 2U PRBC transfusion  -12/23 transfuse 1 unit PRBC -Per review of EMR refused EGD during recent hospitalization Recent Labs  Lab 01/12/17 0402 01/13/17 0613 01/14/17 0609 01/14/17 1043 01/15/17 0358 01/15/17 1258  HGB 8.1* 8.2* 7.6* 8.2* 7.4* 7.9*  -borderline -CT abdomen and pelvis: Negative for retroperitoneal bleed the results below -1/3 Hemolyzing? Haptoglobin, LDH pending. Anemia panel pending.  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 Rory Percy Neurology. Concurs that if patient's hemoglobin stable restart patient on ASA 81 mg daily aspirin.  Restarted  -12/30 doubt ASA 81 mg/day will cause patient to have 1 unit drop in hemoglobin however will DC aspirin  Dementia/Altered mental status  -Haldol IV 3 mg  TID -Acute change in mental status: Negative acute findings head CT. However CT angiogram chest show worsening multifocal pneumonia: Negative PE see results below.,   Increased ammonia level -Lactulose enema BID. Although not severely elevated given patient's dementia and continuous mental status may be enough to have pushed him over the edge.  Non-Hodgkin's mantle cell lymphoma on the right side of the neck -Further management as per Dr. Jana Hakim oncology; continue Rituximab -He would be an excellent candidate for ibrutinib but this can lead to bruising/bleeding and can worsen Afib. For now we are working to increase the rituximab dose and can repeat prednisone next week.  -Per oncology at this point any intervention attempted will most likely make the situation worse.  Acute kidney injury  -prerenal  in etiology  Recent Labs  Lab 01/10/17 1153 01/11/17 0425 01/12/17 0402 01/13/17 0613 01/14/17 0609 01/14/17 1405  CREATININE 0.70 0.69 0.63 0.84 1.08  1.28*  -Resolved-  Thrombocytopenia(baseline platelets WNL in November 2018)  -Secondary to stress reaction, malignancy, chemotherapy -Stable however has not recovered to November baseline -Signs of overt bleeding   HLD  -Lipitor 20 mg daily    Chronic Diastolic CHF -Strict in and out since admission -2.5 L -Daily weight Filed Weights   01/11/17 0318 01/12/17 0649 01/13/17 1232  Weight: 213 lb 3 oz (96.7 kg) 221 lb 1.9 oz (100.3 kg) 286 lb (129.7 kg)  -Future hemoglobin<   8 -S/P 2U PRBC transfusion  -12/23 Transfuse 1 unit PRBC -12/27 transfuse 1 unit PRBC -12/30 transfuse 1 unit PRBC  Chronic Atrial Fibrillation -Currently NSR -Anticoagulation on hold secondary to severe anemia  -Amiodarone 200 mg daily -Diltiazem 180 mg daily -Metoprolol IV 5 mg QID -Lasix IV 80 mg 1; then Lasix IV 40 mg daily  Pulmonary HTN -See CHF    Right upper extremity swelling chronic/RIGHT neck swelling (chronic)  - possibly from lymph node involvement from mantle cell lymphoma.  Hypokalemia -Potassium goal> 4  Hypomagnesia -Magnesium> 2       Goals of care -12/30 PALLIATIVE CARE: Patient with extensive lymphoma, multiple medical problems address CODE STATUS, short-term vs long-term goals of care -1/2 consult for CoRTrak tube placement placed     DVT prophylaxis: SCD Code Status: DO NOT RESUSCITATE Family Communication: None Disposition Plan: DVD   Consultants:  Oncology Neurology    Procedures/Significant Events:  12/30 CT abdomen and pelvis:: No evidence of retroperitoneal hematoma.   Splenomegaly, extensive abdominopelvic lymphadenopathy, and scattered retroperitoneal/extraperitoneal soft tissue nodules/nodes, as described above. These findings are likely related to the patient's recent diagnosis of mantle cell lymphoma.   Moderate bilateral pleural effusions. Patchy pulmonary opacities, incompletely visualized, possibly reflecting moderate  interstitial edema versus multifocal infection.   1/2 PCXR worsening pulmonary edema: Possible underlying infectious etiology? 1/2 Echocardiogram:Left ventricle: LVEF  55% to 60%.  -Right ventricle: Moderately dilated.   - Right atrium:  moderately dilated. -Pulmonary arteries: PA peak pressure: 51 mm Hg (S). 1/3 PCXR:-Worsening bilateral and interstitial pneumonia-superimposed CHF 1/3 CT head wo contrast negative acute infarct/hemorrhage.-New small right mastoid effusion 1/3 CT angiogram chest PE protocol:-Mild increase in the degree of adenopathy consistent with the patient's given clinical history of mantle cell lymphoma. -Negative PE- Diffuse bilateral infiltrates consistent with multifocal pneumonia with bilateral pleural effusions.   I have personally reviewed and interpreted all radiology studies and my findings are as above.  VENTILATOR SETTINGS:    Cultures 12/21/2 blood positive coag negative staph (most likely contaminant 12/21 urine positive insignificant growth 1/1 blood NGTD 1/1 strep pneumo urine antigen negative 1/2 urine pending    Antimicrobials: Anti-infectives (From admission, onward)   Start     Stop   01/14/17 1700  vancomycin (VANCOCIN) IVPB 1000 mg/200 mL premix         01/13/17 1800  vancomycin (VANCOCIN) IVPB 1000 mg/200 mL premix  Status:  Discontinued     01/14/17 0902   01/13/17 1400  ceFEPIme (MAXIPIME) 1 g in dextrose 5 % 50 mL IVPB     01/21/17 1359   01/13/17 1045  vancomycin (VANCOCIN) 2,000 mg in sodium chloride 0.9 % 500 mL IVPB     01/13/17 1349       Devices    LINES / TUBES:      Continuous Infusions: .  ceFEPime (MAXIPIME) IV Stopped (01/15/17 0620)     Objective: Vitals:   01/15/17 0012 01/15/17 0439 01/15/17 0740 01/15/17 0748  BP: (!) 77/56 (!) 81/53  (!) 100/56  Pulse:    (!) 121  Resp:    (!) 28  Temp: 99.5 F (37.5 C) 99 F (37.2 C) 99 F (37.2 C)   TempSrc: Axillary Axillary Axillary   SpO2:    93%   Weight:      Height:        Intake/Output Summary (Last 24 hours) at 01/15/2017 0903 Last data filed at 01/15/2017 0550 Gross per 24 hour  Intake 600 ml  Output 1050 ml  Net -450 ml   Filed Weights   01/11/17 0318 01/12/17 0649 01/13/17 1232  Weight: 213 lb 3 oz (96.7 kg) 221 lb 1.9 oz (100.3 kg) 286 lb (129.7 kg)    Physical Exam:  General: A/O 0, does not follow commands, positive acute respiratory distress  Neck:  Negative scars, masses, torticollis, positive bilateral lymphadenopathy secondary to mantle cell carcinoma JVD Lungs: tachypnea diffuse rhonchi, diffuse poor air movement,  Cardiovascular: Tachycardia, Regular  rhythm without murmur gallop or rub normal S1 and S2 Abdomen: Morbidly obese, negative abdominal pain, positive distention, positive soft, bowel sounds, no rebound, no ascites, no appreciable mass Extremities: No significant cyanosis, clubbing, or edema bilateral lower extremities Skin: Negative rashes, lesions, ulcers Psychiatric:  Unable to assess secondary to altered mental status  Central nervous system:  Unable to assess secondary to altered mental status patient thrashing around moving all 4 extremities spontaneously moaning and repeating help me help me       Data Reviewed: Care during the described time interval was provided by me .  I have reviewed this patient's available data, including medical history, events of note, physical examination, and all test results as part of my evaluation.   CBC: Recent Labs  Lab 01/12/17 0402 01/13/17 0613 01/14/17 0609 01/14/17 1043 01/15/17 0358  WBC 6.9 7.8 7.4 8.0 8.8  NEUTROABS  --   --   --  3.4  --   HGB 8.1* 8.2* 7.6* 8.2* 7.4*  HCT 26.6* 27.9* 25.9* 28.2* 25.5*  MCV 98.5 104.1* 103.6* 104.4* 101.2*  PLT 44* 52* 43* 45* 46*   Basic Metabolic Panel: Recent Labs  Lab 01/10/17 0411  01/11/17 0425 01/12/17 0402 01/13/17 2671 01/14/17 0609 01/14/17 1043 01/14/17 1405 01/15/17 0358  NA  --    <  > 141 139 143 144  --  145  --   K  --    < > 4.2 3.9 4.8 3.5  --  3.9  --   CL  --    < > 109 105 106 107  --  104  --   CO2  --    < > 24 26 28 30   --  31  --   GLUCOSE  --    < > 89 80 130* 98  --  93  --   BUN  --    < > 12 11 15  21*  --  23*  --   CREATININE  --    < > 0.69 0.63 0.84 1.08  --  1.28*  --   CALCIUM  --    < > 7.8* 7.9* 8.2* 7.8*  --  7.9*  --   MG 2.1  --  1.9 1.9  --   --  2.1  --  2.1   < > = values in  this interval not displayed.   GFR: Estimated Creatinine Clearance: 78 mL/min (A) (by C-G formula based on SCr of 1.28 mg/dL (H)). Liver Function Tests: Recent Labs  Lab 01/13/17 0613 01/14/17 0609  AST 38 38  ALT 12* 12*  ALKPHOS 96 89  BILITOT 1.6* 2.0*  PROT 4.6* 4.3*  ALBUMIN 1.8* 1.7*   No results for input(s): LIPASE, AMYLASE in the last 168 hours. No results for input(s): AMMONIA in the last 168 hours. Coagulation Profile: No results for input(s): INR, PROTIME in the last 168 hours. Cardiac Enzymes: No results for input(s): CKTOTAL, CKMB, CKMBINDEX, TROPONINI in the last 168 hours. BNP (last 3 results) No results for input(s): PROBNP in the last 8760 hours. HbA1C: No results for input(s): HGBA1C in the last 72 hours. CBG: Recent Labs  Lab 01/13/17 2006  GLUCAP 97   Lipid Profile: No results for input(s): CHOL, HDL, LDLCALC, TRIG, CHOLHDL, LDLDIRECT in the last 72 hours. Thyroid Function Tests: No results for input(s): TSH, T4TOTAL, FREET4, T3FREE, THYROIDAB in the last 72 hours. Anemia Panel: No results for input(s): VITAMINB12, FOLATE, FERRITIN, TIBC, IRON, RETICCTPCT in the last 72 hours. Urine analysis:    Component Value Date/Time   COLORURINE AMBER (A) 01/02/2017 0117   APPEARANCEUR CLEAR 01/02/2017 0117   LABSPEC 1.021 01/02/2017 0117   PHURINE 5.0 01/02/2017 0117   GLUCOSEU NEGATIVE 01/02/2017 0117   HGBUR NEGATIVE 01/02/2017 0117   BILIRUBINUR NEGATIVE 01/02/2017 0117   KETONESUR 5 (A) 01/02/2017 0117   PROTEINUR 100 (A)  01/02/2017 0117   NITRITE NEGATIVE 01/02/2017 0117   LEUKOCYTESUR NEGATIVE 01/02/2017 0117   Sepsis Labs: @LABRCNTIP (procalcitonin:4,lacticidven:4)  ) Recent Results (from the past 240 hour(s))  Culture, blood (routine x 2) Call MD if unable to obtain prior to antibiotics being given     Status: None (Preliminary result)   Collection Time: 01/13/17 10:55 AM  Result Value Ref Range Status   Specimen Description BLOOD LEFT HAND  Final   Special Requests IN PEDIATRIC BOTTLE Blood Culture adequate volume  Final   Culture NO GROWTH 2 DAYS  Final   Report Status PENDING  Incomplete  Culture, blood (routine x 2) Call MD if unable to obtain prior to antibiotics being given     Status: None (Preliminary result)   Collection Time: 01/13/17 11:02 AM  Result Value Ref Range Status   Specimen Description BLOOD LEFT HAND  Final   Special Requests IN PEDIATRIC BOTTLE Blood Culture adequate volume  Final   Culture NO GROWTH 2 DAYS  Final   Report Status PENDING  Incomplete         Radiology Studies: Dg Chest Port 1 View  Result Date: 01/14/2017 CLINICAL DATA:  Pneumonia EXAM: PORTABLE CHEST 1 VIEW COMPARISON:  Study obtained earlier in the day; January 13, 2017 FINDINGS: There is persistent airspace consolidation with volume loss the right upper lobe. There is patchy infiltrate in both lower lung zones, more on the left than on the right. There is also patchy infiltrate in the left mid lung. Compared to study obtained earlier in the day, there is no new opacity. Heart is mildly enlarged with pulmonary vascularity within normal limits. No adenopathy. No evident bone lesions. IMPRESSION: Multifocal pneumonia, stable from earlier in the day. Stable cardiac prominence. Electronically Signed   By: Lowella Grip III M.D.   On: 01/14/2017 20:04   Dg Chest Port 1 View  Result Date: 01/14/2017 CLINICAL DATA:  67 year old male with pulmonary edema. Subsequent encounter. EXAM: PORTABLE CHEST  1 VIEW  COMPARISON:  01/13/2017 in 12/2018 2018 chest x-ray. FINDINGS: Worsening asymmetric patchy airspace disease most notable right upper lobe and left base. This may represent infectious infiltrates and/or pulmonary edema. No pneumothorax detected. Cardiomegaly. Calcified aorta. No acute osseous abnormality. IMPRESSION: Worsening asymmetric patchy airspace disease most notable right upper lobe and left base. This may represent infectious infiltrates and/or pulmonary edema. Cardiomegaly. Electronically Signed   By: Genia Del M.D.   On: 01/14/2017 07:21        Scheduled Meds: . allopurinol  300 mg Oral Daily  . amiodarone  200 mg Oral Daily  . diltiazem  180 mg Oral Daily  . haloperidol lactate  3 mg Intravenous Q8H  . LORazepam  2 mg Intravenous Once  . metoprolol tartrate  5 mg Intravenous Q6H  . pantoprazole (PROTONIX) IV  40 mg Intravenous Q24H  . potassium chloride  50 mEq Oral Daily   Continuous Infusions: . ceFEPime (MAXIPIME) IV Stopped (01/15/17 0620)     LOS: 13 days    Time spent: 40 minutes    WOODS, Geraldo Docker, MD [facility] Pager [phone]   If 7PM-7AM, please contact night-coverage [email or web address] Password [email or web address] 01/15/2017, 9:03 AM

## 2017-01-16 DIAGNOSIS — L8990 Pressure ulcer of unspecified site, unspecified stage: Secondary | ICD-10-CM

## 2017-01-16 LAB — BASIC METABOLIC PANEL
Anion gap: 13 (ref 5–15)
BUN: 37 mg/dL — ABNORMAL HIGH (ref 6–20)
CALCIUM: 8 mg/dL — AB (ref 8.9–10.3)
CO2: 28 mmol/L (ref 22–32)
CREATININE: 1.71 mg/dL — AB (ref 0.61–1.24)
Chloride: 109 mmol/L (ref 101–111)
GFR, EST AFRICAN AMERICAN: 46 mL/min — AB (ref >60)
GFR, EST NON AFRICAN AMERICAN: 40 mL/min — AB (ref >60)
GLUCOSE: 110 mg/dL — AB (ref 65–99)
Potassium: 3.4 mmol/L — ABNORMAL LOW (ref 3.5–5.1)
Sodium: 150 mmol/L — ABNORMAL HIGH (ref 135–145)

## 2017-01-16 LAB — CBC
HCT: 25.5 % — ABNORMAL LOW (ref 39.0–52.0)
Hemoglobin: 7.5 g/dL — ABNORMAL LOW (ref 13.0–17.0)
MCH: 29.6 pg (ref 26.0–34.0)
MCHC: 29.4 g/dL — ABNORMAL LOW (ref 30.0–36.0)
MCV: 100.8 fL — AB (ref 78.0–100.0)
PLATELETS: 50 10*3/uL — AB (ref 150–400)
RBC: 2.53 MIL/uL — ABNORMAL LOW (ref 4.22–5.81)
RDW: 20 % — AB (ref 11.5–15.5)
WBC: 9.1 10*3/uL (ref 4.0–10.5)

## 2017-01-16 LAB — URINE CULTURE: CULTURE: NO GROWTH

## 2017-01-16 LAB — HAPTOGLOBIN: HAPTOGLOBIN: 222 mg/dL — AB (ref 34–200)

## 2017-01-16 LAB — AMMONIA: AMMONIA: 31 umol/L (ref 9–35)

## 2017-01-16 LAB — MAGNESIUM: Magnesium: 2.1 mg/dL (ref 1.7–2.4)

## 2017-01-16 MED ORDER — LORAZEPAM 2 MG/ML IJ SOLN: 0.5000 mg | INTRAMUSCULAR | Status: DC | PRN

## 2017-01-16 MED ORDER — FENTANYL CITRATE (PF) 100 MCG/2ML IJ SOLN
50.0000 ug | Freq: Once | INTRAMUSCULAR | Status: AC
  Administered 2017-01-16: 50 ug via INTRAVENOUS
  Filled 2017-01-16: qty 2

## 2017-01-16 MED ORDER — FENTANYL CITRATE (PF) 2500 MCG/50ML IJ SOLN: 10.0000 ug/h | INTRAMUSCULAR | Status: DC

## 2017-01-16 MED ORDER — FENTANYL 2500MCG IN NS 250ML (10MCG/ML) PREMIX INFUSION
20.0000 ug/h | INTRAVENOUS | Status: DC
  Administered 2017-01-16: 10 ug/h via INTRAVENOUS
  Filled 2017-01-16: qty 250

## 2017-01-16 MED ORDER — FENTANYL BOLUS VIA INFUSION
25.0000 ug | INTRAVENOUS | Status: DC | PRN
  Administered 2017-01-16 – 2017-01-17 (×5): 25 ug via INTRAVENOUS
  Filled 2017-01-16: qty 25

## 2017-01-16 MED ORDER — SODIUM CHLORIDE 0.9 % IV SOLN
INTRAVENOUS | Status: DC
  Administered 2017-01-16 – 2017-01-17 (×3): via INTRAVENOUS

## 2017-01-16 MED ORDER — SODIUM CHLORIDE 0.9 % IV BOLUS (SEPSIS)
500.0000 mL | Freq: Once | INTRAVENOUS | Status: AC
  Administered 2017-01-16: 500 mL via INTRAVENOUS

## 2017-01-16 NOTE — Progress Notes (Signed)
Bipap mask switched from under the nose to full face mask due to excessive leak and pt being restless and mask sliding despite tightening repeatedly. RN made aware. Leak remains 40-60 on bipap.

## 2017-01-16 NOTE — Progress Notes (Signed)
Dr. Thereasa Solo text paged regarding pt BP in 70's, even after several checks and is scheduled for Lasix this am.  Made aware of HR 120-139.  Made aware of order for Lactulose enema with ammonia level 31.  Will continue to monitor and await return call from MD or orders.  Also made aware of low K 3.4.

## 2017-01-16 NOTE — Progress Notes (Addendum)
Initial Nutrition Assessment  DOCUMENTATION CODES:   Obesity unspecified  INTERVENTION:    Once Cortrak placed, will start Jevity 1.2 at 25 ml/hr and increase by 10 ml every 4 hours to goal rate of 75 ml/hr  Prostat liquid protein 30 ml daily via tube  Provides 2260 kcals, 115 gm protein, 1452 ml of free water daily  NUTRITION DIAGNOSIS:   Inadequate oral intake related to (confusion & agitation) as evidenced by NPO status  GOAL:   Patient will meet greater than or equal to 90% of their needs  MONITOR:   TF tolerance, Labs, Weight trends, Skin, I & O's  REASON FOR ASSESSMENT:   Consult, Low Braden Enteral/tube feeding initiation and management  ASSESSMENT:   67 y.o. Male withPMHx Non Hodgkin'smantle cell Lymphoma s/p 3 cycles of Rituximab, atrial fibrillation, normocytic anemia,Bladder tumor, HTN, HLD,andCVA. Presented from the oncology infusion center with generalized weakness and near syncope. Pt also reported he had a fall the day prior in which he hit his head. He was found to be dehydrated, anemic, thrombocytopenic, tachycardic and in acute renal failure.    RD unable to obtain nutrition hx from pt. Ad[facility]. Currently sleeping on BiPAP. Cortrak Tube Team unable to place feeding tube at time of visit. Medications include Lasix, Ativan and Haldol. Labs reviewed. Na 150 (H). K 3.4 (L).  Low braden score places patient at risk for skin breakdown.   NUTRITION - FOCUSED PHYSICAL EXAM:    Most Recent Value  Orbital Region  No depletion  Upper Arm Region  No depletion  Thoracic and Lumbar Region  No depletion  Buccal Region  No depletion  Temple Region  No depletion  Clavicle Bone Region  No depletion  Clavicle and Acromion Bone Region  No depletion  Scapular Bone Region  No depletion  Dorsal Hand  No depletion  Patellar Region  No depletion  Anterior Thigh Region  No depletion  Posterior Calf Region  No depletion  Edema (RD  Assessment)  None     Diet Order:  Diet NPO time specified Except for: Sips with Meds  EDUCATION NEEDS:   No education needs have been identified at this time  Skin:  Skin Assessment: Reviewed RN Assessment  Last BM:  1/4   Intake/Output Summary (Last 24 hours) at 01/16/2017 1135 Last data filed at 01/16/2017 0100 Gross per 24 hour  Intake 300 ml  Output 925 ml  Net -625 ml   Height:   Ht Readings from Last 1 Encounters:  01/15/17 5\' 11"  (1.803 m)   Weight:   Wt Readings from Last 1 Encounters:  01/15/17 283 lb (128.4 kg)   Ideal Body Weight:  78.1 kg  BMI:  Body mass index is 39.47 kg/m.  Estimated Nutritional Needs:   Kcal:  2200-2400  Protein:  110-125 gm  Fluid:  2.2-2.4 L  Arthur Holms, RD, LDN Pager #: [phone] After-Hours Pager #: [phone]

## 2017-01-16 NOTE — Progress Notes (Signed)
Orders rec'd to discontinue lactulose enemas and Lasix.

## 2017-01-16 NOTE — Progress Notes (Addendum)
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 Pt also reported he had a fall the day prior in which he hit his head. He was found to be dehydrated, anemic, thrombocytopenic, tachycardic, and in acute renal failure.   Subjective: The patient is declining and appears to be dying.  We have continued supportive care within the confines of his no CODE BLUE order but unfortunately he has not been able to mount a consistent response.  There is been no significant improvement over the last 48 hours and in fact the patient continues to decline.  We will continue supportive care for now but will also add measures to assure that the patient is not anxious or in uncontrolled pain.  At the time of my visit he does indeed appear to be anxious and is thrashing about in the bed somewhat.  Assessment & Plan:  Acute hypoxic resp failure - Multifocal severe PNA w/ probable ARDS Cont tx for HCAP - IV diureses limited by profound hypotension - is worsening despite our best efforts to tx him - cont BIPAP support for now - anticipate death in hospital   Hypotension Diff includes acute stroke, anemia, orthostatic hypotension/dehydration from chemotherapy - likely all of the above - presently is most likley due to septic shock   Anemia of chronic disease secondary to malignancy and chemotherapy. 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 Jana Hakim - as per his documentation tx is not presently an option   Acute kidney injury - ATN crt had normalized but is now climbing again in the setting of profound hypotension   Thrombocytopenia ?due to malignancy and chemotherapy - holding ASA   HLD Unable to take oral meds at this time   Chronic atrial fibrillation no anticoagulation due to severe anemia - no route for oral meds at this time - rate high as is appropriate in setting of hypotension   Right upper extremity swelling possibly from lymph node involvement from mantle cell lymphoma. R upper extremity venous duplex w/o evidence of DVT - hx suggests this is a long standing issue - keep R UE elevated   1 of 2 Blood Cx + for coag neg Meth resistant Staph Suspicious for contaminant - follow clinically - repeat blood cx 01/13/17 negative x2 - will be covered w/ Vanc being used for PNA   Nutrition  Not appropriate for NG feeding at this time - d/c order to place Cortrak  DVT prophylaxis: SCDs Code Status: DNR - NO CODE  Family Communication: no family present at time of exam or able to be located  Disposition Plan: SDU - continue active care balanced w/ attention to  comfort as well - pt appears to be failing tx and actively dying   Consultants:  Neurology  Palliative Care   Procedures: none  Antimicrobials:  Maxipime 1/1 > Vanc 1/1 >  Objective: Blood pressure (!) 57/35, pulse (!) 44, temperature 98.8 F (37.1 C), temperature source Axillary, resp. rate (!) 21, height 5\' 11"  (1.803 m), weight 128.4 kg (283 lb), SpO2 93 %.  Intake/Output Summary (Last 24 hours) at 01/16/2017 1447 Last data filed at 01/16/2017 0100 Gross per 24 hour  Intake 300 ml  Output 925 ml  Net -625  ml   Filed Weights   01/12/17 0649 01/13/17 1232 01/15/17 1100  Weight: 100.3 kg (221 lb 1.9 oz) 129.7 kg (286 lb) 128.4 kg (283 lb)    Examination: General: tachypneic - appears mildly agitated and uncomfortable  Lungs: course crackles th/o - on BIPAP Cardiovascular: tachycardic - irreg irreg - no M  Abdomen: soft, bowel sounds hypoacitve, obese, no rebound  Extremities: trace B LE edema - 1+ RUE edema at present   CBC: Recent Labs  Lab 01/13/17 0613 01/14/17 0609 01/14/17 1043 01/15/17 0358 01/15/17 1258 01/16/17 0440  WBC 7.8 7.4 8.0 8.8  --  9.1  NEUTROABS  --   --  3.4  --   --   --   HGB 8.2* 7.6* 8.2* 7.4* 7.9* 7.5*  HCT 27.9* 25.9* 28.2* 25.5* 26.2* 25.5*  MCV 104.1* 103.6* 104.4* 101.2*  --  100.8*  PLT 52* 43* 45* 46*  --  50*   Basic Metabolic Panel: Recent Labs  Lab 01/11/17 0425 01/12/17 0402 01/13/17 1093 01/14/17 0609 01/14/17 1043 01/14/17 1405 01/15/17 0358 01/15/17 1008 01/16/17 0440  NA 141 139 143 144  --  145  --  148* 150*  K 4.2 3.9 4.8 3.5  --  3.9  --  3.8 3.4*  CL 109 105 106 107  --  104  --  109 109  CO2 24 26 28 30   --  31  --  28 28  GLUCOSE 89 80 130* 98  --  93  --  100* 110*  BUN 12 11 15  21*  --  23*  --  33* 37*  CREATININE 0.69 0.63 0.84 1.08  --  1.28*  --  1.62* 1.71*  CALCIUM 7.8* 7.9* 8.2* 7.8*  --  7.9*  --  7.9* 8.0*  MG 1.9 1.9  --   --  2.1  --  2.1  --  2.1   GFR: Estimated Creatinine Clearance: 58 mL/min (A) (by C-G formula based on SCr of 1.71 mg/dL (H)).  Liver Function Tests: Recent Labs  Lab 01/13/17 0613 01/14/17 0609 01/15/17 1258  AST 38 38 47*  ALT 12* 12* 13*  ALKPHOS 96 89 98  BILITOT 1.6* 2.0* 2.6*  PROT 4.6* 4.3* 4.5*  ALBUMIN 1.8* 1.7* 1.7*    HbA1C: Hgb A1c MFr Bld  Date/Time Value Ref Range Status  12/12/2016 01:48 AM 5.2 4.8 - 5.6 % Final    Comment:    (NOTE) Pre diabetes:          5.7%-6.4% Diabetes:              >6.4% Glycemic control for   <7.0% adults with diabetes      Scheduled Meds: . allopurinol  300 mg Oral Daily  . amiodarone  200 mg Oral Daily  . chlorhexidine  15 mL Mouth Rinse BID  . Chlorhexidine Gluconate Cloth  6 each Topical Daily  .  diltiazem  180 mg Oral Daily  . haloperidol lactate  3 mg Intravenous Q8H  . mouth rinse  15 mL Mouth Rinse q12n4p  . metoprolol tartrate  5 mg Intravenous Q6H  . pantoprazole (PROTONIX) IV  40 mg Intravenous Q24H  . potassium chloride  50 mEq Oral Daily     LOS: 14 days   [name], MD [facility] Office  [phone] Pager - Text Page per Shea Evans as per below:  On-Call/Text Page:      [email or web address]      password [email or web address]  If 7PM-7AM, please contact night-coverage [email or web address] Password [email or web address] 01/16/2017, 2:47 PM

## 2017-01-16 NOTE — Progress Notes (Signed)
Dr. Thereasa Solo made aware of BP being in 50's.  States will see pt next as he is on floor.

## 2017-01-16 NOTE — Progress Notes (Signed)
Unable to get a BP on pt x all four limbs.  Cable changed out and still unable to get.  Took manual pressure; see flowsheet.  Metoprolol IV given to try to slow HR and hopefully increase BP.

## 2017-01-16 NOTE — Progress Notes (Signed)
Text paged Dr. Thereasa Solo and made aware that pt is semi-responsive and only localizes to sternal rub.  Does not open eyes at this time.  Unable to give po medications.

## 2017-01-16 NOTE — Progress Notes (Signed)
[name] [name]   DOB:11-29-1950   MV#:[id number]   GGE#:[id number]  Subjective:  [name] [name] is moderately sedated and not responding to voice or touch. He is thrashing about a little in bed but much less than previously. He appears to be tolerating BiPap and I did not see him attempt to remove it. No family in room  Objective: middle aged White man examined in bed Vitals:   01/16/17 0727 01/16/17 0733  BP: (!) 114/53   Pulse: 99   Resp: (!) 26   Temp:  98.7 F (37.1 C)  SpO2: 92%     Body mass index is 39.47 kg/m.  Intake/Output Summary (Last 24 hours) at 01/16/2017 0801 Last data filed at 01/16/2017 0100 Gross per 24 hour  Intake 300 ml  Output 1150 ml  Net -850 ml    CBG (last 3)  Recent Labs    01/13/17 2006  GLUCAP 97     Labs:  Lab Results  Component Value Date   WBC 9.1 01/16/2017   HGB 7.5 (L) 01/16/2017   HCT 25.5 (L) 01/16/2017   MCV 100.8 (H) 01/16/2017   PLT 50 (L) 01/16/2017   NEUTROABS 3.4 01/14/2017     Urine Studies No results for input(s): UHGB, CRYS in the last 72 hours.  Invalid input(s): UACOL, UAPR, USPG, UPH, UTP, UGL, UKET, UBIL, UNIT, UROB, ULEU, UEPI, UWBC, Junie Panning [place], [place], [place]  Basic Metabolic Panel: Recent Labs  Lab 01/11/17 0425 01/12/17 0402 01/13/17 4650 01/14/17 0609 01/14/17 1043 01/14/17 1405 01/15/17 0358 01/15/17 1008 01/16/17 0440  NA 141 139 143 144  --  145  --  148* 150*  K 4.2 3.9 4.8 3.5  --  3.9  --  3.8 3.4*  CL 109 105 106 107  --  104  --  109 109  CO2 '24 26 28 30  '$ --  31  --  28 28  GLUCOSE 89 80 130* 98  --  93  --  100* 110*  BUN '12 11 15 '$ 21*  --  23*  --  33* 37*  CREATININE 0.69 0.63 0.84 1.08  --  1.28*  --  1.62* 1.71*  CALCIUM 7.8* 7.9* 8.2* 7.8*  --  7.9*  --  7.9* 8.0*  MG 1.9 1.9  --   --  2.1  --  2.1  --  2.1   GFR Estimated Creatinine Clearance: 58 mL/min (A) (by C-G formula based on SCr of 1.71 mg/dL (H)). Liver Function Tests: Recent Labs  Lab 01/13/17 3546 01/14/17 0609  01/15/17 1258  AST 38 38 47*  ALT 12* 12* 13*  ALKPHOS 96 89 98  BILITOT 1.6* 2.0* 2.6*  PROT 4.6* 4.3* 4.5*  ALBUMIN 1.8* 1.7* 1.7*   No results for input(s): LIPASE, AMYLASE in the last 168 hours. Recent Labs  Lab 01/15/17 1258 01/16/17 0440  AMMONIA 74* 31   Coagulation profile No results for input(s): INR, PROTIME in the last 168 hours.  CBC: Recent Labs  Lab 01/13/17 0613 01/14/17 0609 01/14/17 1043 01/15/17 0358 01/15/17 1258 01/16/17 0440  WBC 7.8 7.4 8.0 8.8  --  9.1  NEUTROABS  --   --  3.4  --   --   --   HGB 8.2* 7.6* 8.2* 7.4* 7.9* 7.5*  HCT 27.9* 25.9* 28.2* 25.5* 26.2* 25.5*  MCV 104.1* 103.6* 104.4* 101.2*  --  100.8*  PLT 52* 43* 45* 46*  --  50*   Cardiac Enzymes: No results for input(s):  CKTOTAL, CKMB, CKMBINDEX, TROPONINI in the last 168 hours. BNP: Invalid input(s): POCBNP CBG: Recent Labs  Lab 01/13/17 2006  GLUCAP 97   D-Dimer No results for input(s): DDIMER in the last 72 hours. Hgb A1c No results for input(s): HGBA1C in the last 72 hours. Lipid Profile No results for input(s): CHOL, HDL, LDLCALC, TRIG, CHOLHDL, LDLDIRECT in the last 72 hours. Thyroid function studies No results for input(s): TSH, T4TOTAL, T3FREE, THYROIDAB in the last 72 hours.  Invalid input(s): FREET3 Anemia work up Recent Labs    01/15/17 1008  VITAMINB12 830  FOLATE 14.5  FERRITIN 958*  TIBC 140*  IRON 37*  RETICCTPCT 4.4*   [facility] x2 yesterday results pending  Studies:  Ct Abdomen Pelvis Wo Contrast  Result Date: 01/12/2017 CLINICAL DATA:  Evaluate for retroperitoneal bleed. History of bladder tumor. 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 EXAM: CT ABDOMEN AND PELVIS WITHOUT CONTRAST TECHNIQUE: Multidetector CT imaging of the abdomen and pelvis was performed following the standard protocol without IV contrast. COMPARISON:  CT abdomen/ pelvis dated 06/22/2012. FINDINGS: Motion degraded images. Lower chest: Moderate  bilateral pleural effusions. Patchy opacities in the visualized lungs, incompletely characterized, possibly reflecting moderate interstitial edema versus multifocal infection. Hepatobiliary: Unenhanced liver is notable for a 3.0 cm hypodense lesion in segment 4A (series 3/ image 16), previously better characterized as a simple cyst. Gallbladder is grossly unremarkable, noting gallbladder sludge. Pancreas: Within normal limits. Spleen: Enlarged, measuring 18.7 cm in maximal craniocaudal dimension. Adrenals/Urinary Tract: Adrenal glands are within normal limits. 6.0 cm medial left upper pole renal cyst (series 3/ image 32), previously 5.3 cm in 2014, benign. Right kidney is within normal limits. No hydronephrosis. Bladder is within normal limits. Stomach/Bowel: Stomach is within normal limits. No evidence of bowel obstruction. Normal appendix (series 3/image 67). Vascular/Lymphatic: No evidence of abdominal aortic aneurysm. Extensive abdominopelvic lymphadenopathy, including: --2.6 cm short axis portacaval node (series 3/ image 29) --1.8 cm short axis left para-aortic node (series 3/ image 44) --2.8 cm short axis right external iliac node (series 3/image 67) --2.8 cm short axis left deep inguinal node (series 3/image 74) --2.3 cm short axis left inguinal node (series 3/ image 89) Reproductive: Prostate is unremarkable. Other: No abdominopelvic ascites.  Extensive body wall edema. No evidence of retroperitoneal hematoma. Scattered right retroperitoneal/posterior perinephric soft tissue nodules/nodes measuring up to 2.9 x 2.7 cm (series 3/image 53). Extraperitoneal 2.2 x 1.9 cm soft tissue implant/node along the left anterior aspect of the bladder (series 3/ image 81). Musculoskeletal: Mild degenerative changes of the visualized thoracolumbar spine. Large, predominantly fat density lesion in the right upper extremity (series 3/ image 1), incompletely visualized, better evaluated on prior right upper extremity CT.  IMPRESSION: No evidence of retroperitoneal hematoma. Splenomegaly, extensive abdominopelvic lymphadenopathy, and scattered retroperitoneal/extraperitoneal soft tissue nodules/nodes, as described above. These findings are likely related to the patient's recent diagnosis of mantle cell lymphoma. Moderate bilateral pleural effusions. Patchy pulmonary opacities, incompletely visualized, possibly reflecting moderate interstitial edema versus multifocal infection. Additional ancillary findings as above. Electronically Signed   By: Julian Hy M.D.   On: 01/12/2017 08:03   Ct Angio Head W Or Wo Contrast  Result Date: 01/02/2017 CLINICAL DATA:  Stroke follow-up EXAM: CT ANGIOGRAPHY HEAD AND NECK TECHNIQUE: Multidetector CT imaging of the head and neck was performed using the standard protocol during bolus administration of intravenous contrast. Multiplanar CT image reconstructions and MIPs were obtained to evaluate the vascular anatomy. Carotid stenosis measurements (when applicable) are obtained  utilizing NASCET criteria, using the distal internal carotid diameter as the denominator. CONTRAST:  73m ISOVUE-370 IOPAMIDOL (ISOVUE-370) INJECTION 76% COMPARISON:  None. FINDINGS: CT HEAD FINDINGS Brain: No mass lesion, intraparenchymal hemorrhage or extra-axial collection. No evidence of acute cortical infarct. The corpus callosum is absent. There is an old right frontal lobe infarct. Vascular: No hyperdense vessel or unexpected calcification. Skull: Normal visualized skull base, calvarium and extracranial soft tissues. Sinuses/Orbits: There is moderate mucosal thickening of the right maxillary sinus and mild left maxillary mucosal thickening. Fluid level in the right sphenoid sinus. Normal orbits. CTA NECK FINDINGS Aortic arch: There is mild calcific atherosclerosis of the aortic arch. There is no aneurysm, dissection or hemodynamically significant stenosis of the visualized ascending aorta and aortic arch.  Conventional 3 vessel aortic branching pattern. The visualized proximal subclavian arteries are normal. Right carotid system: The right common carotid origin is widely patent. There is no common carotid or internal carotid artery dissection or aneurysm. No hemodynamically significant stenosis. Left carotid system: The left common carotid origin is widely patent. There is no common carotid or internal carotid artery dissection or aneurysm. No hemodynamically significant stenosis. Vertebral arteries: The vertebral system is left dominant. Both vertebral artery origins are normal. Both vertebral arteries are normal to their confluence with the basilar artery. Skeleton: There is no bony spinal canal stenosis. No lytic or blastic lesions. Other neck: There are bilateral massively enlarged lymph nodes at all cervical levels, with the largest located at right level 5 AA, measuring up to 5.2 cm. Nodes extend into the upper mediastinum. Upper chest: Small bilateral pleural effusions. Review of the MIP images confirms the above findings CTA HEAD FINDINGS Anterior circulation: --Intracranial internal carotid arteries: Normal. --Anterior cerebral arteries: Normal. --Middle cerebral arteries: Normal. --Posterior communicating arteries: Absent bilaterally. Posterior circulation: --Posterior cerebral arteries: Normal. --Superior cerebellar arteries: Normal. --Basilar artery: Normal. Venous sinuses: As permitted by contrast timing, patent. Anatomic variants: None Delayed phase: No parenchymal contrast enhancement. Review of the MIP images confirms the above findings IMPRESSION: 1. No emergent large vessel occlusion or high-grade stenosis of the major intracranial and cervical arteries. 2.  Aortic Atherosclerosis (ICD10-I70.0). 3. Massively enlarged bilateral cervical lymph nodes throughout the neck, generally unchanged compared to 11/21/2016 and consistent with the diagnosis of non-Hodgkin's lymphoma. 4. 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 5. Small bilateral pleural effusions. Electronically Signed   By: KUlyses JarredM.D.   On: 01/02/2017 22:18   Dg Chest 2 View  Result Date: 01/03/2017 CLINICAL DATA:  Sepsis, hypotensive EXAM: CHEST  2 VIEW COMPARISON:  12/11/2016, CT 11/27/2016 FINDINGS: Mild cardiomegaly with aortic atherosclerosis. Tiny pleural effusions. Vague opacities are suspected in the right perihilar region and left lung base. No pneumothorax. IMPRESSION: 1. Mild cardiomegaly with tiny pleural effusion 2. Vague right upper lobe/perihilar and left lung base pulmonary opacity, could reflect small foci of infection or pulmonary nodules. Electronically Signed   By: KDonavan FoilM.D.   On: 12/29/2016 16:25   Ct Head Wo Contrast  Result Date: 01/15/2017 CLINICAL DATA:  67year old male with recent CVA, now with acute altered mental status. EXAM: CT HEAD WITHOUT CONTRAST TECHNIQUE: Contiguous axial images were obtained from the base of the skull through the vertex without intravenous contrast. COMPARISON:  01/02/2017 CT and prior studies FINDINGS: Brain: No evidence of acute infarction, hemorrhage, hydrocephalus, extra-axial collection or mass lesion/mass effect. Right parietal encephalomalacia and agenesis of the corpus callosum again noted. Vascular: Mild atherosclerotic calcifications Skull: No acute  abnormality. Sinuses/Orbits: Small right mastoid effusion now identified fluid in the right sphenoid sinus is decreased. Mucosal thickening/ fluid in the maxillary sinuses with surgical changes again noted. Other: None IMPRESSION: 1. No evidence of acute intracranial abnormality 2. Right parietal encephalomalacia and agenesis of the corpus callosum again noted. 3. New small right mastoid effusion Electronically Signed   By: Margarette Canada M.D.   On: 01/15/2017 17:33   Ct Angio Neck W Or Wo Contrast  Result Date: 01/02/2017 CLINICAL DATA:  Stroke follow-up EXAM: CT ANGIOGRAPHY HEAD AND  NECK TECHNIQUE: Multidetector CT imaging of the head and neck was performed using the standard protocol during bolus administration of intravenous contrast. Multiplanar CT image reconstructions and MIPs were obtained to evaluate the vascular anatomy. Carotid stenosis measurements (when applicable) are obtained utilizing NASCET criteria, using the distal internal carotid diameter as the denominator. CONTRAST:  48m ISOVUE-370 IOPAMIDOL (ISOVUE-370) INJECTION 76% COMPARISON:  None. FINDINGS: CT HEAD FINDINGS Brain: No mass lesion, intraparenchymal hemorrhage or extra-axial collection. No evidence of acute cortical infarct. The corpus callosum is absent. There is an old right frontal lobe infarct. Vascular: No hyperdense vessel or unexpected calcification. Skull: Normal visualized skull base, calvarium and extracranial soft tissues. Sinuses/Orbits: There is moderate mucosal thickening of the right maxillary sinus and mild left maxillary mucosal thickening. Fluid level in the right sphenoid sinus. Normal orbits. CTA NECK FINDINGS Aortic arch: There is mild calcific atherosclerosis of the aortic arch. There is no aneurysm, dissection or hemodynamically significant stenosis of the visualized ascending aorta and aortic arch. Conventional 3 vessel aortic branching pattern. The visualized proximal subclavian arteries are normal. Right carotid system: The right common carotid origin is widely patent. There is no common carotid or internal carotid artery dissection or aneurysm. No hemodynamically significant stenosis. Left carotid system: The left common carotid origin is widely patent. There is no common carotid or internal carotid artery dissection or aneurysm. No hemodynamically significant stenosis. Vertebral arteries: The vertebral system is left dominant. Both vertebral artery origins are normal. Both vertebral arteries are normal to their confluence with the basilar artery. Skeleton: There is no bony spinal canal  stenosis. No lytic or blastic lesions. Other neck: There are bilateral massively enlarged lymph nodes at all cervical levels, with the largest located at right level 5 AA, measuring up to 5.2 cm. Nodes extend into the upper mediastinum. Upper chest: Small bilateral pleural effusions. Review of the MIP images confirms the above findings CTA HEAD FINDINGS Anterior circulation: --Intracranial internal carotid arteries: Normal. --Anterior cerebral arteries: Normal. --Middle cerebral arteries: Normal. --Posterior communicating arteries: Absent bilaterally. Posterior circulation: --Posterior cerebral arteries: Normal. --Superior cerebellar arteries: Normal. --Basilar artery: Normal. Venous sinuses: As permitted by contrast timing, patent. Anatomic variants: None Delayed phase: No parenchymal contrast enhancement. Review of the MIP images confirms the above findings IMPRESSION: 1. No emergent large vessel occlusion or high-grade stenosis of the major intracranial and cervical arteries. 2.  Aortic Atherosclerosis (ICD10-I70.0). 3. Massively enlarged bilateral cervical lymph nodes throughout the neck, generally unchanged compared to 11/21/2016 and consistent with the diagnosis of non-Hodgkin's lymphoma. 4. 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 5. Small bilateral pleural effusions. Electronically Signed   By: KUlyses JarredM.D.   On: 01/02/2017 22:18   Ct Angio Chest Pe W Or Wo Contrast  Result Date: 01/15/2017 CLINICAL DATA:  Generalized weakness, history of mantle cell lymphoma EXAM: CT ANGIOGRAPHY CHEST WITH CONTRAST TECHNIQUE: Multidetector CT imaging of the chest was performed using the standard protocol  during bolus administration of intravenous contrast. Multiplanar CT image reconstructions and MIPs were obtained to evaluate the vascular anatomy. CONTRAST:  128m ISOVUE-370 IOPAMIDOL (ISOVUE-370) INJECTION 76% COMPARISON:  Plain film from earlier in the same day, CT from  11/27/2016. FINDINGS: Cardiovascular: Thoracic aorta shows no evidence of aneurysmal dilatation or dissection. Mild atherosclerotic calcifications are seen. No cardiac enlargement is noted. The pulmonary artery shows no filling defect to suggest pulmonary embolism. Mediastinum/Nodes: Considerable bilateral supraclavicular adenopathy is noted similar to that seen on prior CT examination. Largest node in the left supraclavicular region measures 2.3 cm in short axis slightly increased 2.1 cm. The collection on the right is difficult to measure although left measurement suggests 7.9 x 5.7 cm. This has increased by a few mm in the interval from the prior exam. Scattered mediastinal lymph nodes are seen as are small hilar lymph nodes. Largest lymph node in the mediastinum is in the subcarinal region measuring approximately 2.2 cm slightly increased from the prior CT examination which time it measured 19 mm. Bulky axillary adenopathy is noted as well worse on the right than the left. One of the larger left axillary lymph nodes measures 3.4 cm in short axis increased from 3.2 cm on the prior CT. The axillary adenopathy on the right is difficult to assess due to its bulky in nature although appears to have increased slightly in dimension now measuring 14.5 cm when previously measured 12.9 cm. Lungs/Pleura: Bilateral pleural effusions are noted of a moderate size as well as diffuse bilateral parenchymal infiltrates. No focal mass lesion is seen. Upper Abdomen: Cyst is noted within the right lobe of the liver stable from the prior exam. The spleen is enlarged consistent with the given clinical history of mantle cell lymphoma. Scattered porta hepatis and celiac lymph nodes are noted roughly stable from the prior study. Left renal cyst is partially visualized and appears stable. Musculoskeletal: Right biceps lipoma is noted similar to that seen on a prior CT examination. Degenerative changes of the thoracic spine are seen. No  compression deformities are noted. Review of the MIP images confirms the above findings. IMPRESSION: Mild increase in the degree of adenopathy consistent with the patient's given clinical history of mantle cell lymphoma. No evidence of pulmonary embolism. Diffuse bilateral infiltrates consistent with multifocal pneumonia with bilateral pleural effusions. Aortic Atherosclerosis (ICD10-I70.0). Electronically Signed   By: MInez CatalinaM.D.   On: 01/15/2017 17:41   [name] Brain Wo Contrast  Result Date: 12/29/2016 CLINICAL DATA:  Initial evaluation for acute altered mental status. History of min coma. EXAM: MRI HEAD WITHOUT CONTRAST TECHNIQUE: Multiplanar, multiecho pulse sequences of the brain and surrounding structures were obtained without intravenous contrast. COMPARISON:  None. FINDINGS: Brain: Study limited as the patient was unable to tolerate the full length of the exam. Additionally, images provided are markedly degraded by motion artifact. On coronal DWI sequence, there is a 5 mm focus of diffusion abnormality within the left cerebellar hemisphere, suspicious for possible small acute ischemic infarct (series 5, image 9). This is not well seen on axial sequence. No secondary findings to suggest associated hemorrhage or mass effect. No other convincing foci of restricted diffusion to suggest acute or subacute ischemia. Minimal patchy diffusion abnormality overlying the surface/cortical gray matter of the right parieto-occipital region favored to be artifactual in nature (series 3, image 27). No other acute intracranial process. Agenesis of the corpus callosum again noted. Encephalomalacia out within the right parietal region related to prior hemorrhage. No mass lesion or midline  shift. No significant mass effect. No extra-axial fluid collection. Vascular: Major intracranial vascular flow voids are maintained at the skullbase. Skull and upper cervical spine: Craniocervical junction within normal limits. Upper  cervical spine normal. Bone marrow signal intensity grossly within normal limits. No scalp soft tissue abnormality. Sinuses/Orbits: The globes and orbital soft tissues within normal limits. Scattered mucosal thickening throughout the paranasal sinuses. Air-fluid levels within the maxillary and right sphenoid sinus. Trace left mastoid effusion. Other: None. IMPRESSION: 1. Limited exam as the patient was unable to tolerate the full length of the study. Additionally, images provided are degraded by motion. 2. Punctate 5 mm focus of diffusion abnormality within the left cerebellar hemisphere, suspicious for small acute ischemic infarct. 3. No other definite acute intracranial abnormality. 4. 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 5. Acute paranasal sinusitis as above. Electronically Signed   By: Jeannine Boga M.D.   On: 01/10/2017 20:57   Dg Chest Port 1 View  Result Date: 01/15/2017 CLINICAL DATA:  Respiratory distress. History of lymphoma, bladder neoplasm. EXAM: PORTABLE CHEST 1 VIEW COMPARISON:  Chest x-ray of January 14, 2017 FINDINGS: The lungs are well-expanded. Interstitial and alveolar opacities have worsened bilaterally. The cardiac silhouette remains enlarged but the left heart border is now indistinct. The pulmonary vascularity is engorged and indistinct. There is calcification in the wall of the aortic arch. There is multilevel degenerative disc disease of the thoracic spine. IMPRESSION: Worsening of bilateral interstitial and alveolar pneumonia. Likely superimposed CHF. Thoracic aortic atherosclerosis. Electronically Signed   By: David  Martinique M.D.   On: 01/15/2017 09:28   Dg Chest Port 1 View  Result Date: 01/14/2017 CLINICAL DATA:  Pneumonia EXAM: PORTABLE CHEST 1 VIEW COMPARISON:  Study obtained earlier in the day; January 13, 2017 FINDINGS: There is persistent airspace consolidation with volume loss the right upper lobe. There is patchy infiltrate in both lower  lung zones, more on the left than on the right. There is also patchy infiltrate in the left mid lung. Compared to study obtained earlier in the day, there is no new opacity. Heart is mildly enlarged with pulmonary vascularity within normal limits. No adenopathy. No evident bone lesions. IMPRESSION: Multifocal pneumonia, stable from earlier in the day. Stable cardiac prominence. Electronically Signed   By: Lowella Grip III M.D.   On: 01/14/2017 20:04   Dg Chest Port 1 View  Result Date: 01/14/2017 CLINICAL DATA:  67 year old male with pulmonary edema. Subsequent encounter. EXAM: PORTABLE CHEST 1 VIEW COMPARISON:  01/13/2017 in 12/2018 2018 chest x-ray. FINDINGS: Worsening asymmetric patchy airspace disease most notable right upper lobe and left base. This may represent infectious infiltrates and/or pulmonary edema. No pneumothorax detected. Cardiomegaly. Calcified aorta. No acute osseous abnormality. IMPRESSION: Worsening asymmetric patchy airspace disease most notable right upper lobe and left base. This may represent infectious infiltrates and/or pulmonary edema. Cardiomegaly. Electronically Signed   By: Genia Del M.D.   On: 01/14/2017 07:21   Dg Chest Port 1 View  Result Date: 01/13/2017 CLINICAL DATA:  67 year old male with a history of shortness of breath and weakness EXAM: PORTABLE CHEST 1 VIEW COMPARISON:  01/09/2017, 12/11/2016, 11/30/2016 FINDINGS: Cardiomediastinal silhouette unchanged with cardiomegaly. Interval development of mixed interstitial and airspace opacities of the bilateral lungs, predominantly right upper and left mid and left lower. No large pleural effusion. IMPRESSION: Multifocal interstitial and airspace disease, which may represent a combination of infection and/ or pulmonary edema. Cardiomegaly Electronically Signed   By: Corrie Mckusick D.O.  On: 01/13/2017 09:57    Assessment: 67 y.o. nursing home resident with atrial fibrillation, prior history of stroke, now with  small left cerebellar lesion per brain MRI 01/09/2017, in the setting of an aggressive mantle cell NHL, anemia, thrombocytopenia, and now respiratory compromise  (1) normocytic anemia: with baseline reticulocyte>100;though LDH and t bil are both elevated, DAT was negative for both IgG and complement and haptoglobin was WNL, not consistent with hemolysis; B-12, folate and ferritin adequate;  consdier bleeding as the cause of his anemia, although guaiacs have been negative and recent CT abd shows no evidence of a retroperitoneal bleed    (a) note this type of NHL frequently invades GI teact and can cause ulceration  (2)right cervical lymph node biopsy 11/27/2016: shows mantle cell non-Hodgkin's lymphoma;      (a) the Ki67 is high and the morphologyis c/wan aggressive pleomorphic variant (a) prednisone x5d started 12/11/2016 (b) started rituximab 12/11/2016, repeated12/07 and 12/13 at lower doses with reactons      (i) negative Hep B/C studies 12/03/2016  (3) agenesis of corpus callosum (congenital)  (4) atrial fibrillation: On amiodarone; not anticoagulated because of bleeding concerns  (5) 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 Note also efforts by SW to ascertain whether there is a HCPOA in place or a near relative (surviving sister?) who might serve as one.  Assuming no HCPOA or equivalent, plan at this point seems to be to treat pneumonia in this immunocompromised patient. 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 Given current status any treatment I could give him would further suppress his immune system (quite aside from other possible complications) and make the situation worse.  If he declines further, at some point switching to comfort care may  be appropriate. He does seem much less agitated and more comfortable now. A DNR as written seems appropriate to me.  Note that mantle cell lymphoma is treatable but not curable. The survival varies with the aggressiveness of disease, but in aggressive cases (as this is, given the high Ki67) median survival is reported as less than 1 year. In [name] Dapper's case as previously noted we are further limited by his comorbidities.  I will follow with you and see him again Monday. Please consult [place] over weekend if we can be of further help      Bobetta Lime, MD 01/16/2017  8:01 AM Medical Oncology and Hematology [facility] [place], [place] [address] Tel. [phone]    Fax. [phone]

## 2017-01-16 NOTE — Progress Notes (Signed)
Cortrak Tube Team Note:  Consult received to place a Cortrak feeding tube.   Upon RD arrival, patient is on BIPAP and patient is not stable enough to transition to alternative 02 source.   Will hold off on placement for now and recheck later this afternoon.   Burtis Junes RD, LDN, CNSC Clinical Nutrition Pager: [phone] 01/16/2017 11:05 AM

## 2017-01-17 LAB — COMPREHENSIVE METABOLIC PANEL
ALT: 13 U/L — AB (ref 17–63)
AST: 51 U/L — AB (ref 15–41)
Albumin: 1.7 g/dL — ABNORMAL LOW (ref 3.5–5.0)
Alkaline Phosphatase: 94 U/L (ref 38–126)
Anion gap: 18 — ABNORMAL HIGH (ref 5–15)
BILIRUBIN TOTAL: 2.7 mg/dL — AB (ref 0.3–1.2)
BUN: 46 mg/dL — AB (ref 6–20)
CHLORIDE: 112 mmol/L — AB (ref 101–111)
CO2: 24 mmol/L (ref 22–32)
CREATININE: 2.1 mg/dL — AB (ref 0.61–1.24)
Calcium: 7.7 mg/dL — ABNORMAL LOW (ref 8.9–10.3)
GFR, EST AFRICAN AMERICAN: 36 mL/min — AB (ref >60)
GFR, EST NON AFRICAN AMERICAN: 31 mL/min — AB (ref >60)
Glucose, Bld: 83 mg/dL (ref 65–99)
POTASSIUM: 4.2 mmol/L (ref 3.5–5.1)
Sodium: 154 mmol/L — ABNORMAL HIGH (ref 135–145)
TOTAL PROTEIN: 4 g/dL — AB (ref 6.5–8.1)

## 2017-01-17 LAB — CBC
HCT: 24.4 % — ABNORMAL LOW (ref 39.0–52.0)
Hemoglobin: 7.2 g/dL — ABNORMAL LOW (ref 13.0–17.0)
MCH: 29.8 pg (ref 26.0–34.0)
MCHC: 29.5 g/dL — ABNORMAL LOW (ref 30.0–36.0)
MCV: 100.8 fL — AB (ref 78.0–100.0)
PLATELETS: 52 10*3/uL — AB (ref 150–400)
RBC: 2.42 MIL/uL — AB (ref 4.22–5.81)
RDW: 20.6 % — AB (ref 11.5–15.5)
WBC: 8.7 10*3/uL (ref 4.0–10.5)

## 2017-01-17 LAB — VANCOMYCIN, RANDOM: VANCOMYCIN RM: 25

## 2017-01-17 MED ORDER — SODIUM CHLORIDE 0.45 % IV SOLN
INTRAVENOUS | Status: DC
  Administered 2017-01-17 – 2017-01-18 (×3): via INTRAVENOUS

## 2017-01-17 MED ORDER — DEXTROSE 5 % IV BOLUS
500.0000 mL | Freq: Once | INTRAVENOUS | Status: AC
  Administered 2017-01-17: 500 mL via INTRAVENOUS

## 2017-01-17 MED ORDER — SODIUM CHLORIDE 0.45 % IV BOLUS
500.0000 mL | Freq: Once | INTRAVENOUS | Status: AC
  Administered 2017-01-17: 500 mL via INTRAVENOUS

## 2017-01-17 MED ORDER — METOPROLOL TARTRATE 5 MG/5ML IV SOLN
2.5000 mg | Freq: Four times a day (QID) | INTRAVENOUS | Status: DC | PRN
  Administered 2017-01-17 – 2017-01-18 (×2): 2.5 mg via INTRAVENOUS
  Filled 2017-01-17 (×2): qty 5

## 2017-01-17 NOTE — Progress Notes (Signed)
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 Pt also reported he had a fall the day prior in which he hit his head. He was found to be dehydrated, anemic, thrombocytopenic, tachycardic, and in acute renal failure.   Subjective: The patient has displayed no signif sign of improvement over the last 24hrs.  I have attempted to contact Ms. Winnie Loftis, whom his preacher reported is his POA.  Her work number indicates that the facility is closed on weekends.  Her home number results in a recording "this call can not be completed at this time."  He displays intermittent agitation and discomfort.    Assessment & Plan:  Acute hypoxic resp failure - Multifocal severe PNA w/ probable ARDS Cont tx for HCAP - IV diureses limited by profound hypotension - is worsening despite our best efforts to tx him - cont BIPAP support for now, though he has desaturated even on BIPAP intermittently - I will adjust his fentanyl gtt as he displays evidence of discomfort intermittently   Hypotension Diff includes acute stroke, anemia, orthostatic hypotension/dehydration from chemotherapy - likely all of the above - presently is most likley due to septic shock   Anemia of chronic disease secondary to malignancy and chemotherapy. 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 Jana Hakim - as per his documentation tx is not presently an option   Acute kidney injury - ATN crt climbing in the setting of recurrent hypotension   Thrombocytopenia ?due to malignancy and chemotherapy   HLD Unable to take oral meds at this time   Chronic atrial fibrillation no anticoagulation due to severe anemia - no route for oral meds at this time - rate poorly controlled in setting of recurring sepsis/hypotension   Right upper extremity swelling possibly from lymph node involvement from mantle cell lymphoma. R upper extremity venous duplex w/o evidence of DVT - hx suggests this is a long standing issue  1 of 2 Blood Cx + for coag neg Meth resistant Staph Suspicious for contaminant - repeat blood cx 01/13/17 negative x2 - will be covered w/ Vanc being used for PNA regardless  Nutrition  Not appropriate for NG feeding at this time - d/c order to place Cortrak  DVT prophylaxis: SCDs Code Status: DNR - NO CODE  Family Communication: no family present at time of exam or able to be located - attempts to contact reported POA have failed  Disposition Plan: SDU - continue active care balanced w/ attention to comfort as well - pt appears to be failing tx and actively dying   Consultants:  Neurology  Palliative Care   Procedures: none  Antimicrobials:  Maxipime  1/1 > Vanc 1/1 >  Objective: Blood pressure (!) 79/66, pulse (!) 129, temperature (!) 100.9 F (38.3 C), temperature source Axillary, resp. rate 17, height 5\' 11"  (1.803 m), weight 128.4 kg (283 lb), SpO2 94 %.  Intake/Output Summary (Last 24 hours) at 01/17/2017 1345 Last data filed at 01/17/2017 0400 Gross per 24 hour  Intake 1498.53 ml  Output 375 ml  Net 1123.53 ml   Filed Weights   01/12/17 0649 01/13/17 1232 01/15/17 1100  Weight:  100.3 kg (221 lb 1.9 oz) 129.7 kg (286 lb) 128.4 kg (283 lb)    Examination: General: tachypneic on BIPAP Lungs: course crackles th/o - no wheezing  Cardiovascular: tachycardic - irreg irreg  Abdomen: soft, bowel sounds absent, obese, no rebound  Extremities: trace B LE edema - 1+ RUE edema   CBC: Recent Labs  Lab 01/14/17 0609 01/14/17 1043 01/15/17 0358 01/15/17 1258 01/16/17 0440 01/17/17 0620  WBC 7.4 8.0 8.8  --  9.1 8.7  NEUTROABS  --  3.4  --   --   --   --   HGB 7.6* 8.2* 7.4* 7.9* 7.5* 7.2*  HCT 25.9* 28.2* 25.5* 26.2* 25.5* 24.4*  MCV 103.6* 104.4* 101.2*  --  100.8* 100.8*  PLT 43* 45* 46*  --  50* 52*   Basic Metabolic Panel: Recent Labs  Lab 01/11/17 0425 01/12/17 0402  01/14/17 0609 01/14/17 1043 01/14/17 1405 01/15/17 0358 01/15/17 1008 01/16/17 0440 01/17/17 0620  NA 141 139   < > 144  --  145  --  148* 150* 154*  K 4.2 3.9   < > 3.5  --  3.9  --  3.8 3.4* 4.2  CL 109 105   < > 107  --  104  --  109 109 112*  CO2 24 26   < > 30  --  31  --  28 28 24   GLUCOSE 89 80   < > 98  --  93  --  100* 110* 83  BUN 12 11   < > 21*  --  23*  --  33* 37* 46*  CREATININE 0.69 0.63   < > 1.08  --  1.28*  --  1.62* 1.71* 2.10*  CALCIUM 7.8* 7.9*   < > 7.8*  --  7.9*  --  7.9* 8.0* 7.7*  MG 1.9 1.9  --   --  2.1  --  2.1  --  2.1  --    < > = values in this interval not displayed.   GFR: Estimated Creatinine Clearance: 47.2 mL/min (A) (by C-G formula based on SCr of 2.1 mg/dL (H)).  Liver Function Tests: Recent Labs  Lab 01/13/17 0613 01/14/17 0609 01/15/17 1258 01/17/17 0620  AST 38 38 47* 51*  ALT 12* 12* 13* 13*  ALKPHOS 96 89 98 94  BILITOT 1.6* 2.0* 2.6* 2.7*  PROT 4.6* 4.3* 4.5* 4.0*  ALBUMIN 1.8* 1.7* 1.7* 1.7*    HbA1C: Hgb A1c MFr Bld  Date/Time Value Ref Range Status  12/12/2016 01:48 AM 5.2 4.8 - 5.6 % Final    Comment:    (NOTE) Pre diabetes:          5.7%-6.4% Diabetes:              >6.4% Glycemic control for   <7.0% adults with  diabetes     Scheduled Meds: . chlorhexidine  15 mL Mouth Rinse BID  . Chlorhexidine Gluconate Cloth  6 each Topical Daily  .  mouth rinse  15 mL Mouth Rinse q12n4p  . metoprolol tartrate  5 mg Intravenous Q6H  . pantoprazole (PROTONIX) IV  40 mg Intravenous Q24H     LOS: 15 days   [name], MD [facility] Office  [phone] Pager - Text Page per Shea Evans as per below:  On-Call/Text Page:      [email or web address]      password [email or web address]  If 7PM-7AM, please contact night-coverage [email or web address] Password [email or web address] 01/17/2017, 1:45 PM

## 2017-01-18 DIAGNOSIS — R0902 Hypoxemia: Secondary | ICD-10-CM

## 2017-01-18 LAB — BASIC METABOLIC PANEL WITH GFR
Anion gap: 13 (ref 5–15)
BUN: 56 mg/dL — ABNORMAL HIGH (ref 6–20)
CO2: 25 mmol/L (ref 22–32)
Calcium: 7.6 mg/dL — ABNORMAL LOW (ref 8.9–10.3)
Chloride: 112 mmol/L — ABNORMAL HIGH (ref 101–111)
Creatinine, Ser: 2.39 mg/dL — ABNORMAL HIGH (ref 0.61–1.24)
GFR calc Af Amer: 31 mL/min — ABNORMAL LOW
GFR calc non Af Amer: 27 mL/min — ABNORMAL LOW
Glucose, Bld: 106 mg/dL — ABNORMAL HIGH (ref 65–99)
Potassium: 4.1 mmol/L (ref 3.5–5.1)
Sodium: 150 mmol/L — ABNORMAL HIGH (ref 135–145)

## 2017-01-18 LAB — CULTURE, BLOOD (ROUTINE X 2)
Culture: NO GROWTH
Culture: NO GROWTH
SPECIAL REQUESTS: ADEQUATE
SPECIAL REQUESTS: ADEQUATE

## 2017-01-18 MED ORDER — HALOPERIDOL LACTATE 5 MG/ML IJ SOLN: 0.5000 mg | INTRAMUSCULAR | Status: DC | PRN

## 2017-01-18 MED ORDER — ORAL CARE MOUTH RINSE: 15.0000 mL | Freq: Two times a day (BID) | OROMUCOSAL | Status: DC | PRN

## 2017-01-18 MED ORDER — LORAZEPAM 1 MG PO TABS: 1.0000 mg | ORAL_TABLET | ORAL | Status: DC | PRN

## 2017-01-18 MED ORDER — HALOPERIDOL LACTATE 2 MG/ML PO CONC
0.5000 mg | ORAL | Status: DC | PRN
Start: 2017-01-18 — End: 2017-01-19
  Filled 2017-01-18: qty 0.3

## 2017-01-18 MED ORDER — LORAZEPAM 2 MG/ML IJ SOLN: 1.0000 mg | INTRAMUSCULAR | Status: DC | PRN

## 2017-01-18 MED ORDER — CHLORHEXIDINE GLUCONATE 0.12 % MT SOLN: 15.0000 mL | Freq: Two times a day (BID) | OROMUCOSAL | Status: DC | PRN

## 2017-01-18 MED ORDER — LORAZEPAM 2 MG/ML PO CONC: 1.0000 mg | ORAL | Status: DC | PRN

## 2017-01-18 MED ORDER — HALOPERIDOL 0.5 MG PO TABS: 0.5000 mg | ORAL_TABLET | ORAL | Status: DC | PRN

## 2017-01-18 MED ORDER — FENTANYL BOLUS VIA INFUSION
25.0000 ug | INTRAVENOUS | Status: DC | PRN
  Filled 2017-01-18: qty 50

## 2017-01-31 ENCOUNTER — Other Ambulatory Visit: Payer: Self-pay | Admitting: Nurse Practitioner

## 2017-02-06 ENCOUNTER — Other Ambulatory Visit: Payer: Self-pay | Admitting: Nurse Practitioner

## 2017-02-13 NOTE — Progress Notes (Signed)
  [place] TEAM 1 - Stepdown/ICU TEAM  Pt/s Friend (Winnie Loftis) arrived at the bedside.  I updated her on his current status and explained my feeling that he was failing active medical care and that transitioning to comfort focused care was the most appropriate intervention at this time.  She accepted this information and understood that there was nothing more that could be done at this time.  She was in agreement to keep him comfortable in treating any potential agitation or anxiety was important.  She informed me that he had already prepaid for his funeral and that all arrangements were made.  She has the name of the funeral home and documentation at her home.  In that BiPAP has not assisted the patient in recovering, and given that he has made no progress over the past 48 hours we will transition to full comfort focused care.  I anticipate he will die within the next 48-72 hours.  [name], MD [facility] Office  [phone] Pager - Text Page per Amion as per below:  On-Call/Text Page:      [email or web address]      password [email or web address]  If 7PM-7AM, please contact night-coverage [email or web address] Password [facility] 2017-02-07, 3:56 PM

## 2017-02-13 NOTE — Progress Notes (Signed)
250 mL bag of Fentanyl was prescribed to patient per MD order. Wasted 150 mL in sink with Rico Sheehan as RN witness.

## 2017-02-13 NOTE — Progress Notes (Signed)
[name] TEAM 1 - Stepdown/ICU TEAM  ROB [name]  [id number] DOB: 04-26-1950 DOA: 12/13/2016 PCP: [name] Low, MD    Brief Narrative:  67 y.o.malewith a history ofNon Hodgkin's mantle cell Lymphoma s/p 3 cycles of Rituximab, atrial fibrillation, normocytic anemia, HTN, HLD, and CVA who presented from the oncology infusion center with generalized weakness and near syncope. Pt also reported he had a fall the day prior in which he hit his head. He was found to be dehydrated, anemic, thrombocytopenic, tachycardic, and in acute renal failure.   Subjective: The number for Ms. [name] [name] [phone]) rings today, but there was no answer and no answering machine.  Pt is obtunded.  No evidence of resp distress or uncontrolled pain.   Assessment & Plan:  Goals of Care If I am unsuccessful in reaching her reported POA again tomorrow, I will ask an impartial second MD opinion to determine if transitioning to full comfort care is appropriate, as I believe it is   Acute hypoxic resp failure - Multifocal severe PNA w/ probable ARDS Cont tx for HCAP - cont BIPAP support for now, though he has desaturated even on BIPAP intermittently - appears comfortable at this time   Hypotension Diff includes acute stroke, anemia, orthostatic hypotension/dehydration from chemotherapy - likely all of the above - presently is most likley due to septic shock   Anemia of chronic disease secondary to malignancy and chemotherapy. S/P 5U PRBC transfusion total - stool for occult blood was negative - Hgb holding steady - refused EGD for w/u during recent hospital stay - CT abdom w/o evidence of RPH  Recent Labs  Lab 01/14/17 1043 01/15/17 0358 01/15/17 1258 01/16/17 0440 01/17/17 0620  HGB 8.2* 7.4* 7.9* 7.5* 7.2*    Acute small infarct in the left cerebellar hemisphere  Neurology directed complete CVA workup - to begin ASA 81mg  QD when safe to do so   Non-Hodgkin's mantle cell lymphoma on the  right side of the neck Further management as per Dr. Jana [name] - as per his documentation tx is not presently an option   Acute kidney injury - ATN crt climbing in the setting of recurrent hypotension   Thrombocytopenia ?due to malignancy and chemotherapy   HLD Unable to take oral meds at this time   Chronic atrial fibrillation no anticoagulation due to severe anemia - no route for oral meds at this time - rate poorly controlled in setting of recurring sepsis/hypotension - cont IV BB  Right upper extremity swelling possibly from lymph node involvement from mantle cell lymphoma. R upper extremity venous duplex w/o evidence of DVT - hx suggests this is a long standing issue  1 of 2 Blood Cx + for coag neg Meth resistant Staph Suspicious for contaminant - repeat blood cx 01/13/17 negative x2 - will be covered w/ Vanc being used for PNA regardless  Nutrition  Not appropriate for NG feeding at this time - d/c order to place Cortrak  DVT prophylaxis: SCDs Code Status: DNR - NO CODE  Family Communication: no family present at time of exam or able to be located - attempts to contact reported POA have failed again today  Disposition Plan: SDU - continue active care balanced w/ attention to comfort as well - pt appears to be failing tx and actively dying   Consultants:  Neurology  Palliative Care   Procedures: none  Antimicrobials:  Maxipime 1/1 > Vanc 1/1 >  Objective: Blood pressure (!) 91/55, pulse (!) 123, temperature (!) 100.6  F (38.1 C), temperature source Axillary, resp. rate (!) 23, height 5\' 11"  (1.803 m), weight 128.4 kg (283 lb), SpO2 93 %.  Intake/Output Summary (Last 24 hours) at February 04, 2017 1453 Last data filed at 02/04/17 1400 Gross per 24 hour  Intake 2664.15 ml  Output 375 ml  Net 2289.15 ml   Filed Weights   01/12/17 0649 01/13/17 1232 01/15/17 1100  Weight: 100.3 kg (221 lb 1.9 oz) 129.7 kg (286 lb) 128.4 kg (283 lb)    Examination: General: appears  calm on BIPAP Lungs: fine crackles diffusely - no wheezing  Cardiovascular: tachycardic, irreg irreg  Abdomen: soft, bowel sounds absent, obese, no rebound  Extremities: trace B LE edema - no change in 1+ RUE edema   CBC: Recent Labs  Lab 01/14/17 0609 01/14/17 1043 01/15/17 0358 01/15/17 1258 01/16/17 0440 01/17/17 0620  WBC 7.4 8.0 8.8  --  9.1 8.7  NEUTROABS  --  3.4  --   --   --   --   HGB 7.6* 8.2* 7.4* 7.9* 7.5* 7.2*  HCT 25.9* 28.2* 25.5* 26.2* 25.5* 24.4*  MCV 103.6* 104.4* 101.2*  --  100.8* 100.8*  PLT 43* 45* 46*  --  50* 52*   Basic Metabolic Panel: Recent Labs  Lab 01/12/17 0402  01/14/17 1043 01/14/17 1405 01/15/17 0358 01/15/17 1008 01/16/17 0440 01/17/17 0620 2017-02-04 0439  NA 139   < >  --  145  --  148* 150* 154* 150*  K 3.9   < >  --  3.9  --  3.8 3.4* 4.2 4.1  CL 105   < >  --  104  --  109 109 112* 112*  CO2 26   < >  --  31  --  28 28 24 25   GLUCOSE 80   < >  --  93  --  100* 110* 83 106*  BUN 11   < >  --  23*  --  33* 37* 46* 56*  CREATININE 0.63   < >  --  1.28*  --  1.62* 1.71* 2.10* 2.39*  CALCIUM 7.9*   < >  --  7.9*  --  7.9* 8.0* 7.7* 7.6*  MG 1.9  --  2.1  --  2.1  --  2.1  --   --    < > = values in this interval not displayed.   GFR: Estimated Creatinine Clearance: 41.5 mL/min (A) (by C-G formula based on SCr of 2.39 mg/dL (H)).  Liver Function Tests: Recent Labs  Lab 01/13/17 0613 01/14/17 0609 01/15/17 1258 01/17/17 0620  AST 38 38 47* 51*  ALT 12* 12* 13* 13*  ALKPHOS 96 89 98 94  BILITOT 1.6* 2.0* 2.6* 2.7*  PROT 4.6* 4.3* 4.5* 4.0*  ALBUMIN 1.8* 1.7* 1.7* 1.7*    HbA1C: Hgb A1c MFr Bld  Date/Time Value Ref Range Status  12/12/2016 01:48 AM 5.2 4.8 - 5.6 % Final    Comment:    (NOTE) Pre diabetes:          5.7%-6.4% Diabetes:              >6.4% Glycemic control for   <7.0% adults with diabetes     Scheduled Meds: . chlorhexidine  15 mL Mouth Rinse BID  . Chlorhexidine Gluconate Cloth  6 each Topical  Daily  . mouth rinse  15 mL Mouth Rinse q12n4p  . metoprolol tartrate  5 mg Intravenous Q6H  . pantoprazole (PROTONIX) IV  40 mg Intravenous Q24H     LOS: 16 days   [name] Altes, MD [facility] Office  [phone] Pager - Text Page per Amion as per below:  On-Call/Text Page:      [email or web address]      password [email or web address]  If 7PM-7AM, please contact night-coverage [email or web address] Password [email or web address] 01-25-2017, 2:53 PM

## 2017-02-13 NOTE — Discharge Summary (Signed)
   Death Summary   [name] [name] [id number] DOB: [birth year] DOA: 2017-01-06  PCP: Wenda Low, MD  Admit date: 01/06/17 Date of Death: 01/23/17  Final Diagnoses:  Acute hypoxic resp failure Multifocal severe PNA ARDS Hypotension Anemia of chronic disease secondary to malignancy and chemotherapy Non-Hodgkin's mantle cell lymphoma  Acute kidney injury - ATN Thrombocytopenia HLD Chronic atrial fibrillation  History of present illness:  67 y.o.malewith a history ofNon Hodgkin's mantle cell Lymphoma s/p 3 cycles of Rituximab, atrial fibrillation, normocytic anemia, HTN, HLD, and CVA who presented from the oncology infusion center with generalized weakness and near syncope. Pt also reported he had a fall the day prior in which he hit his head. He was found to be dehydrated, anemic, thrombocytopenic, tachycardic, and in acute renal failure.  Hospital Course:  The patient was admitted to the acute units with sequelae of severe dehydration.  With volume resuscitation he initially improved, including normalization of his renal function.  Further workup revealed an acute small infarct in the left cerebellar hemisphere.  This issue was attended to by the Stroke Team, and required no acute intervention.  After brief stabilization the patient then began to exhibit significant respiratory difficulty.  A workup revealed multifocal pneumonia.  Aggressive care was administered.  The patient did not improve.  Palliative Care was involved and had a very frank discussion with the patient, who himself made it clear that he would wish to be NO CODE BLUE.  Aggressive medical care within these prescribed limits was continued in a stepdown setting.  Despite broad-spectrum antibiotics, volume resuscitation and support, and BiPAP support for a prolonged period the patient continued to decline.  By 01/23/17 it was clear that the patient was not responding to medical therapy and the decision was made to  transition to full comfort care only.  This was communicated to his close friend and only caregiver who voiced understanding.  BiPAP support was discontinued.  The patient was already on a narcotic drip to assure his comfort.  The patient rapidly declined and passed away at 17:15 on 2017/01/23.  Cause of death is multifocal severe pneumonia with ARDS in the setting of lymphoma.   [name]  [facility] 01/23/2017, 5:37 PM

## 2017-02-13 NOTE — Progress Notes (Signed)
Patient was placed on full comfort care. Patient was pronounced dead at 1715. [facility] called at 1745. Post mortem care completed. Tried several times to get in touch with friend, [name] [name], who had funeral home information, without success. Each call did not lead to a voicemail.

## 2017-02-13 DEATH — deceased

## 2017-03-26 ENCOUNTER — Other Ambulatory Visit: Payer: Self-pay | Admitting: Nurse Practitioner

## 2017-04-10 ENCOUNTER — Other Ambulatory Visit: Payer: Self-pay | Admitting: Nurse Practitioner

## 2017-08-14 ENCOUNTER — Other Ambulatory Visit: Payer: Self-pay | Admitting: Nurse Practitioner

## 2017-08-25 ENCOUNTER — Other Ambulatory Visit: Payer: Self-pay | Admitting: Nurse Practitioner

## 2017-08-28 ENCOUNTER — Other Ambulatory Visit: Payer: Self-pay | Admitting: Nurse Practitioner

## 2017-09-22 ENCOUNTER — Other Ambulatory Visit: Payer: Self-pay | Admitting: Nurse Practitioner

## 2017-12-19 ENCOUNTER — Other Ambulatory Visit: Payer: Self-pay | Admitting: Nurse Practitioner

## 2018-02-11 ENCOUNTER — Other Ambulatory Visit: Payer: Self-pay | Admitting: Nurse Practitioner

## 2018-02-18 ENCOUNTER — Other Ambulatory Visit: Payer: Self-pay | Admitting: Nurse Practitioner

## 2018-04-10 ENCOUNTER — Other Ambulatory Visit: Payer: Self-pay | Admitting: Nurse Practitioner

## 2018-06-11 IMAGING — CR DG CHEST 2V
2 series · 2 of 2 positions shown · non-contrast
Comparison: 12/11/2016, CT 11/27/2016

CLINICAL DATA: Sepsis, hypotensive

EXAM:
CHEST  2 VIEW

[w chest lat]
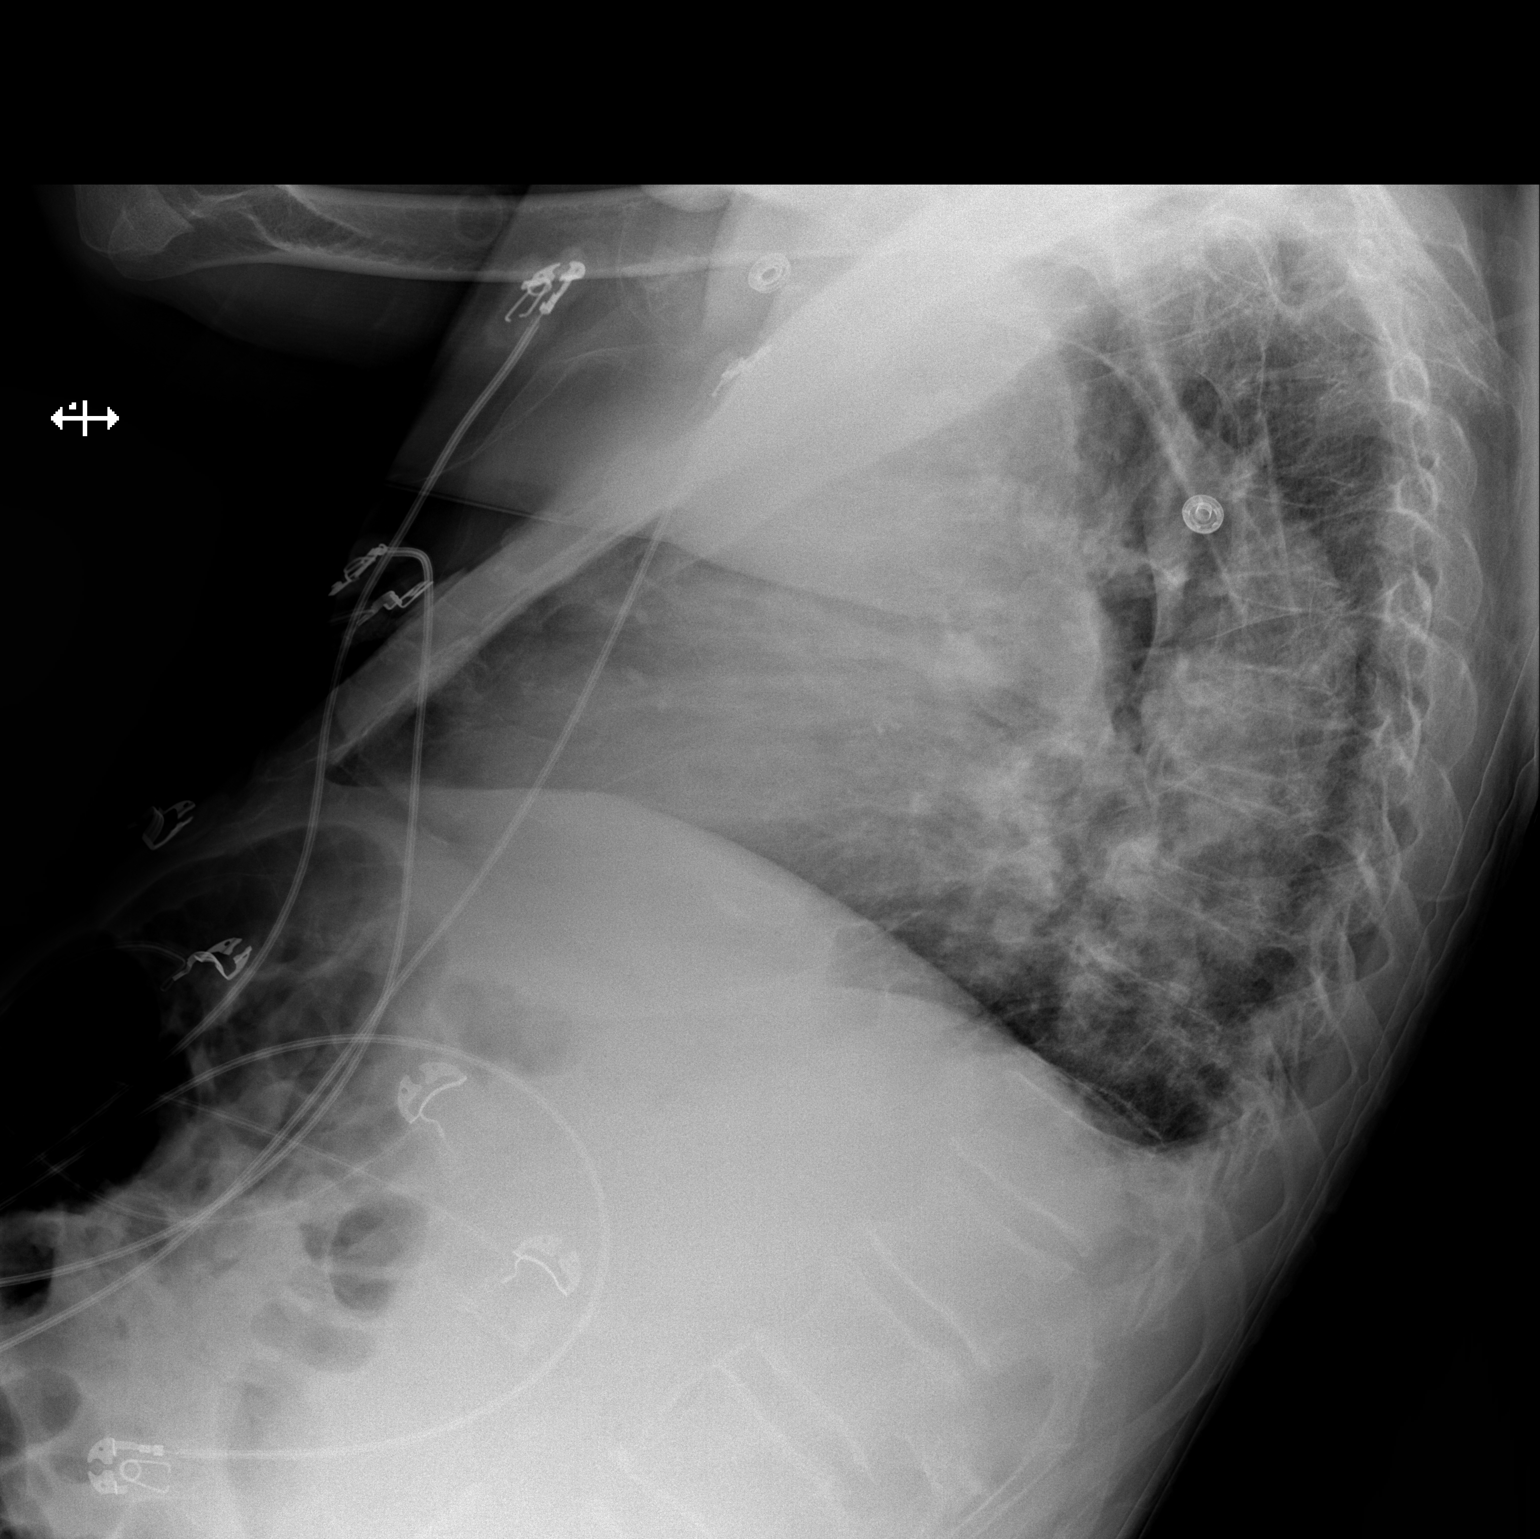

[x chest ap]
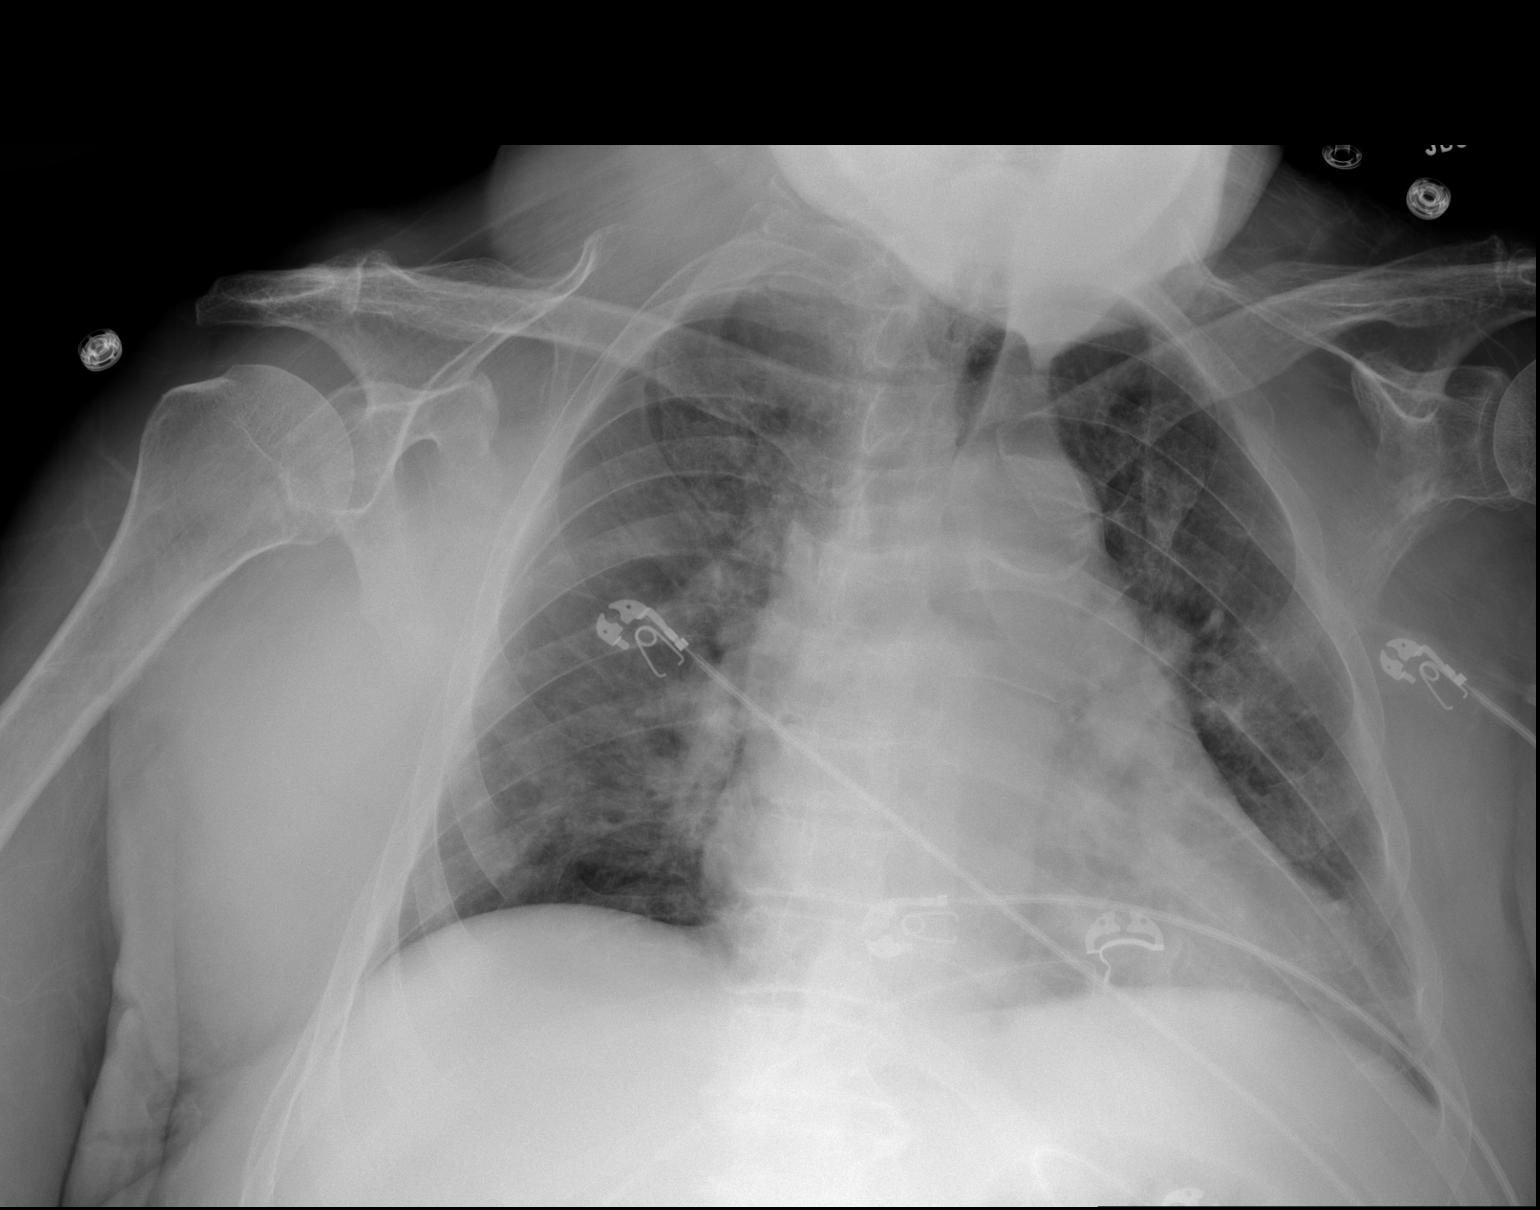

[2 of 2 positions shown; findings below may reference images not displayed]

FINDINGS: Mild cardiomegaly with aortic atherosclerosis. Tiny pleural
effusions. Vague opacities are suspected in the right perihilar
region and left lung base. No pneumothorax.
IMPRESSION: 1. Mild cardiomegaly with tiny pleural effusion
2. Vague right upper lobe/perihilar and left lung base pulmonary
opacity, could reflect small foci of infection or pulmonary nodules.

## 2018-06-11 IMAGING — MR MR HEAD W/O CM
7 series · 48 of 48 positions shown · non-contrast
Comparison: None.

CLINICAL DATA: Initial evaluation for acute altered mental status.
History of min coma.

EXAM:
MRI HEAD WITHOUT CONTRAST
TECHNIQUE: Multiplanar, multiecho pulse sequences of the brain and surrounding
structures were obtained without intravenous contrast.

[Series 3: DWI · axial · 3.0mm · 1.09mm/px · z∈[-23,+132]mm · 14 of 108 slices shown (1 of 4)]
[im 1/108]
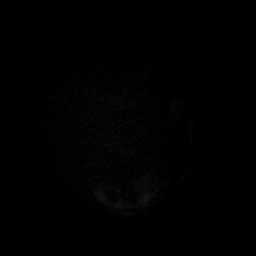
[im 9/108]
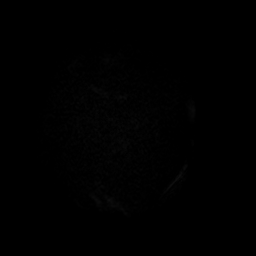
[im 17/108]
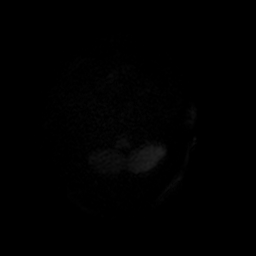
[im 25/108]
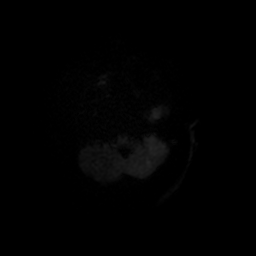
[im 33/108]
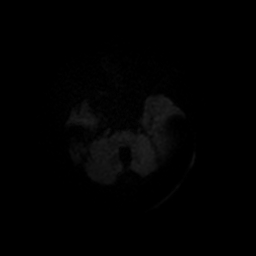
[im 42/108]
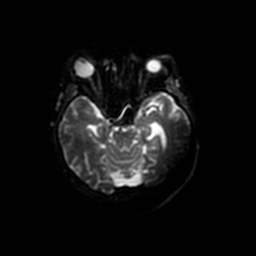
[im 50/108]
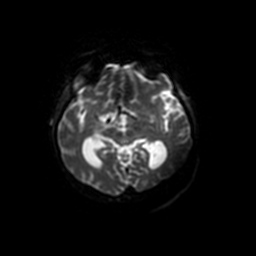
[im 58/108]
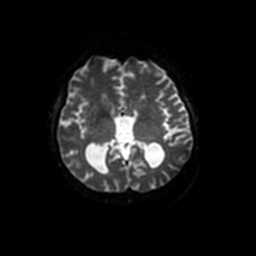
[im 66/108]
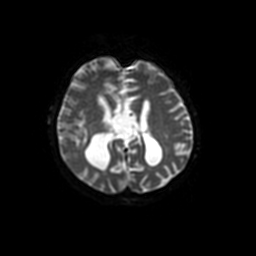
[im 75/108]
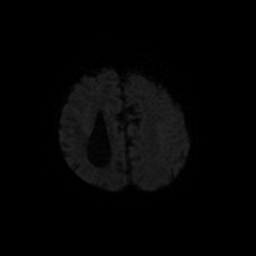
[im 83/108]
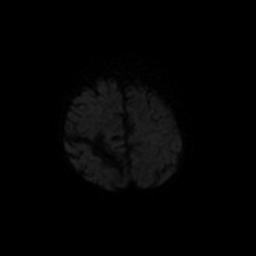
[im 91/108]
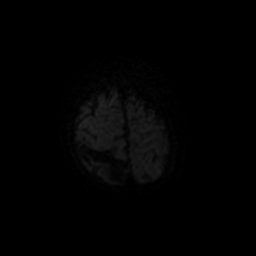
[im 99/108]
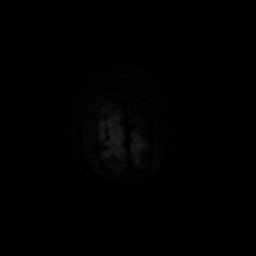
[im 108/108]
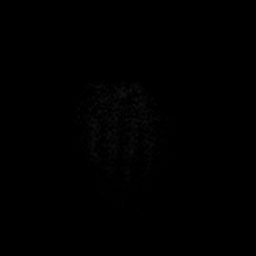

[Series 4: T1 · sagittal · 5.0mm · 0.47mm/px · 4 of 26 slices shown]
[im 1/26]
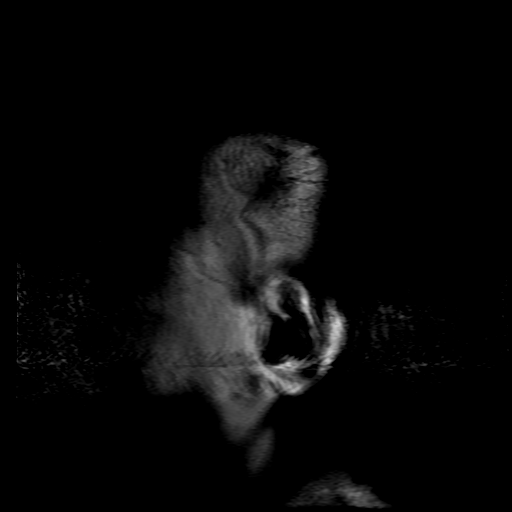
[im 9/26]
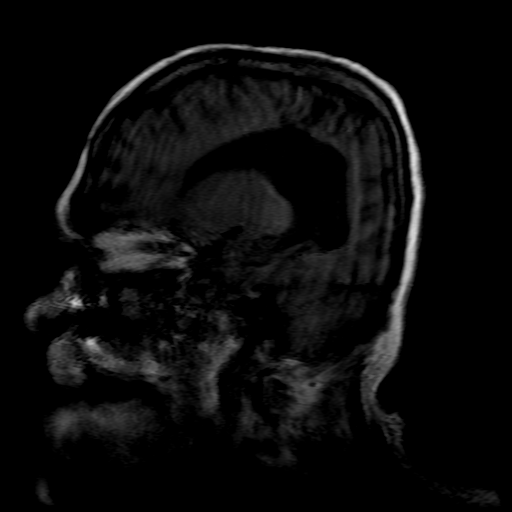
[im 17/26]
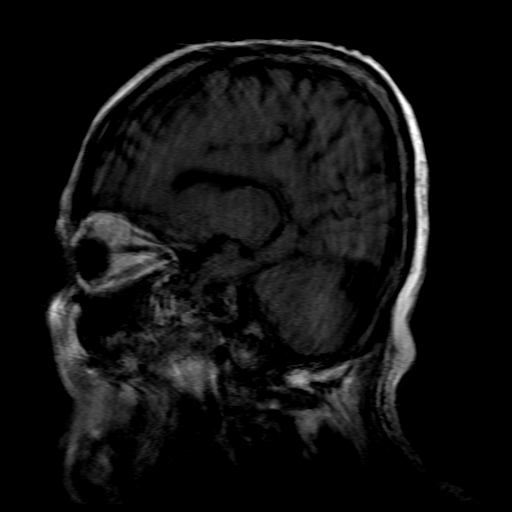
[im 26/26]
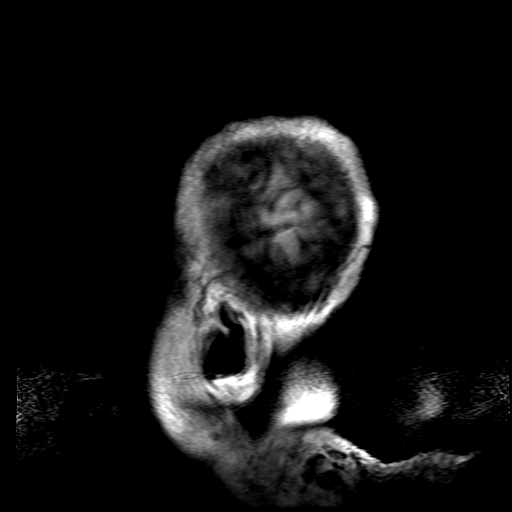

[Series 5: DWI · coronal · 5.0mm · 1.09mm/px · 9 of 67 slices shown (2 of 4)]
[im 1/67]
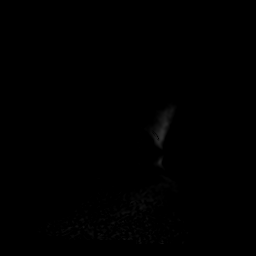
[im 9/67]
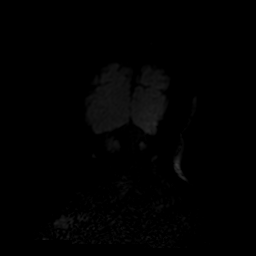
[im 17/67]
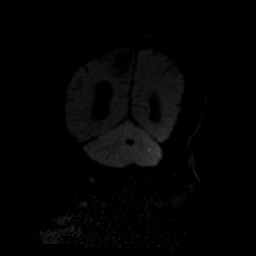
[im 25/67]
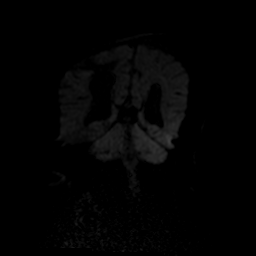
[im 34/67]
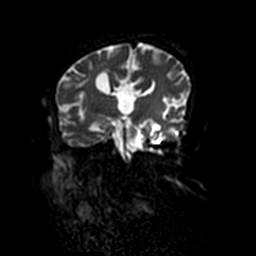
[im 42/67]
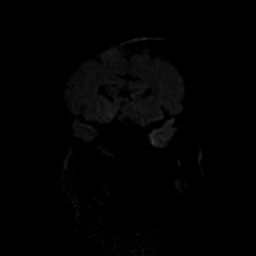
[im 50/67]
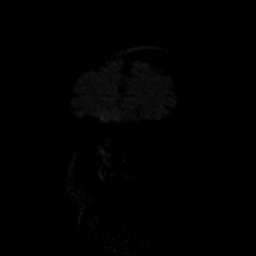
[im 58/67]
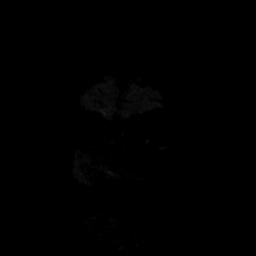
[im 67/67]
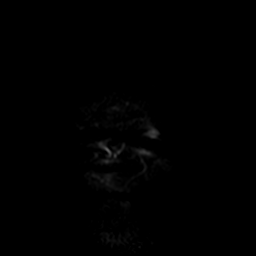

[Series 6: T2 · axial · 5.0mm · 0.86mm/px · z∈[-38,+123]mm · 4 of 27 slices shown]
[im 1/27]
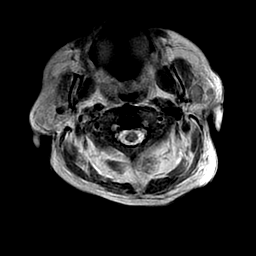
[im 9/27]
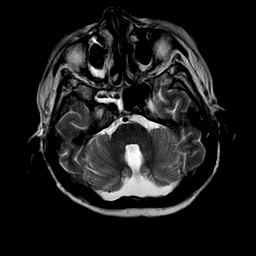
[im 18/27]
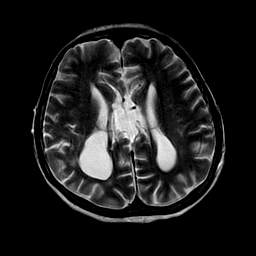
[im 27/27]
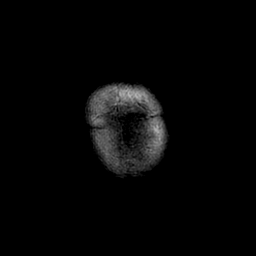

[Series 7: FLAIR · axial · 5.0mm · 0.86mm/px · z∈[-38,+123]mm · 4 of 28 slices shown]
[im 1/28]
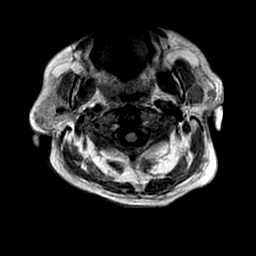
[im 10/28]
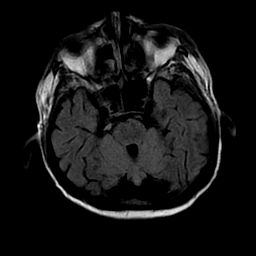
[im 19/28]
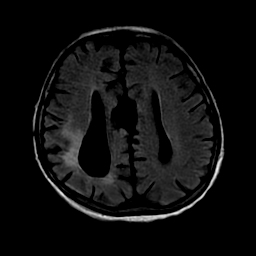
[im 28/28]
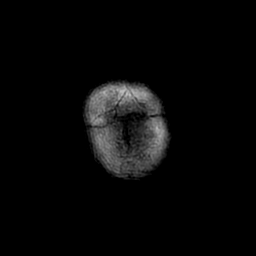

[Series 300: DWI · axial · 3.0mm · 1.09mm/px · z∈[-23,+132]mm · 8 of 54 slices shown (3 of 4)]
[im 1/54]
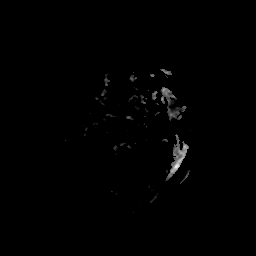
[im 8/54]
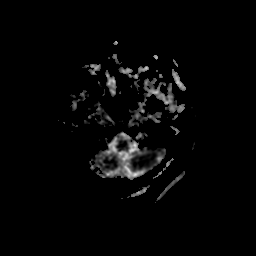
[im 16/54]
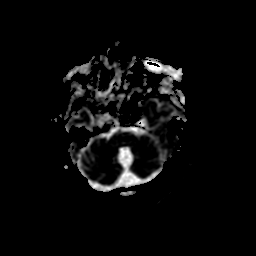
[im 23/54]
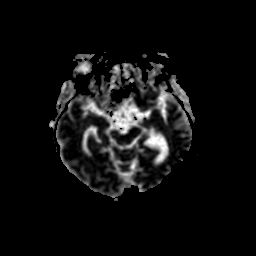
[im 31/54]
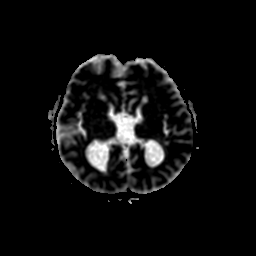
[im 38/54]
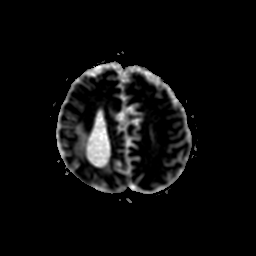
[im 46/54]
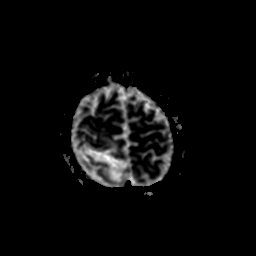
[im 54/54]
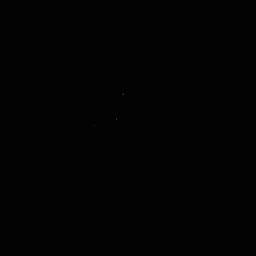

[Series 500: DWI · coronal · 5.0mm · 1.09mm/px · 5 of 34 slices shown (4 of 4)]
[im 1/34]
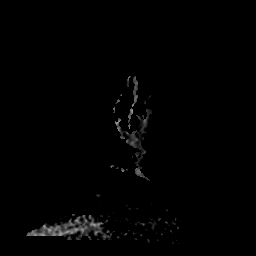
[im 9/34]
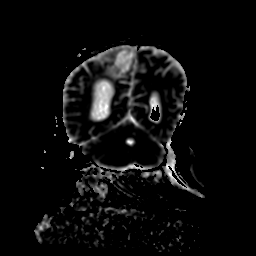
[im 17/34]
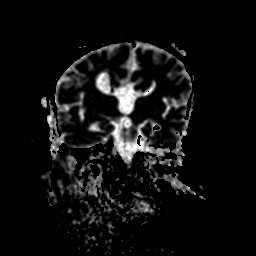
[im 25/34]
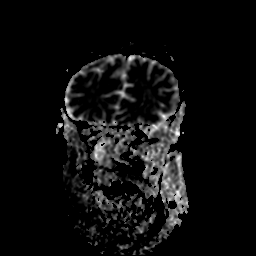
[im 34/34]
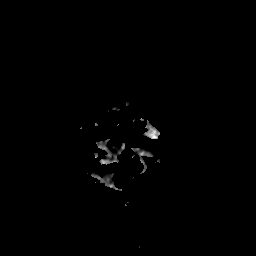

[48 of 48 positions shown; findings below may reference images not displayed]

FINDINGS: Brain: Study limited as the patient was unable to tolerate the full
length of the exam. Additionally, images provided are markedly
degraded by motion artifact.

On coronal DWI sequence, there is a 5 mm focus of diffusion
abnormality within the left cerebellar hemisphere, suspicious for
possible small acute ischemic infarct (series 5, image 9). This is
not well seen on axial sequence. No secondary findings to suggest
associated hemorrhage or mass effect. No other convincing foci of
restricted diffusion to suggest acute or subacute ischemia. Minimal
patchy diffusion abnormality overlying the surface/cortical gray
matter of the right parieto-occipital region favored to be
artifactual in nature (series 3, image 27).

No other acute intracranial process. Agenesis of the corpus callosum
again noted. Encephalomalacia out within the right parietal region
related to prior hemorrhage. No mass lesion or midline shift. No
significant mass effect. No extra-axial fluid collection.

Vascular: Major intracranial vascular flow voids are maintained at
the skullbase.

Skull and upper cervical spine: Craniocervical junction within
normal limits. Upper cervical spine normal. Bone marrow signal
intensity grossly within normal limits. No scalp soft tissue
abnormality.

Sinuses/Orbits: The globes and orbital soft tissues within normal
limits. Scattered mucosal thickening throughout the paranasal
sinuses. Air-fluid levels within the maxillary and right sphenoid
sinus. Trace left mastoid effusion.

Other: None.
IMPRESSION: 1. Limited exam as the patient was unable to tolerate the full
length of the study. Additionally, images provided are degraded by
motion.
2. Punctate 5 mm focus of diffusion abnormality within the left
cerebellar hemisphere, suspicious for small acute ischemic infarct.
3. No other definite acute intracranial abnormality.
4. Agenesis of the corpus callosum with right parietal
encephalomalacia.
5. Acute paranasal sinusitis as above.

## 2018-07-17 ENCOUNTER — Other Ambulatory Visit: Payer: Self-pay | Admitting: Nurse Practitioner

## 2019-01-01 ENCOUNTER — Other Ambulatory Visit: Payer: Self-pay | Admitting: Nurse Practitioner
